# Patient Record
Sex: Female | Born: 1935
Health system: Southern US, Community
[De-identification: ages and names within clinical notes are randomized; demographics above are authoritative.]

## PROBLEM LIST (undated history)

## (undated) DIAGNOSIS — I2510 Atherosclerotic heart disease of native coronary artery without angina pectoris: Secondary | ICD-10-CM

## (undated) DIAGNOSIS — Z972 Presence of dental prosthetic device (complete) (partial): Secondary | ICD-10-CM

## (undated) DIAGNOSIS — M79605 Pain in left leg: Secondary | ICD-10-CM

## (undated) DIAGNOSIS — R270 Ataxia, unspecified: Secondary | ICD-10-CM

## (undated) DIAGNOSIS — I639 Cerebral infarction, unspecified: Secondary | ICD-10-CM

## (undated) DIAGNOSIS — E119 Type 2 diabetes mellitus without complications: Secondary | ICD-10-CM

## (undated) DIAGNOSIS — I214 Non-ST elevation (NSTEMI) myocardial infarction: Secondary | ICD-10-CM

## (undated) DIAGNOSIS — I219 Acute myocardial infarction, unspecified: Secondary | ICD-10-CM

## (undated) DIAGNOSIS — J45909 Unspecified asthma, uncomplicated: Secondary | ICD-10-CM

## (undated) DIAGNOSIS — I499 Cardiac arrhythmia, unspecified: Secondary | ICD-10-CM

## (undated) DIAGNOSIS — L989 Disorder of the skin and subcutaneous tissue, unspecified: Secondary | ICD-10-CM

## (undated) DIAGNOSIS — R569 Unspecified convulsions: Secondary | ICD-10-CM

## (undated) DIAGNOSIS — I4891 Unspecified atrial fibrillation: Secondary | ICD-10-CM

## (undated) DIAGNOSIS — I739 Peripheral vascular disease, unspecified: Secondary | ICD-10-CM

## (undated) DIAGNOSIS — J309 Allergic rhinitis, unspecified: Secondary | ICD-10-CM

## (undated) DIAGNOSIS — M1990 Unspecified osteoarthritis, unspecified site: Secondary | ICD-10-CM

## (undated) DIAGNOSIS — I10 Essential (primary) hypertension: Secondary | ICD-10-CM

## (undated) DIAGNOSIS — I509 Heart failure, unspecified: Secondary | ICD-10-CM

## (undated) DIAGNOSIS — H269 Unspecified cataract: Secondary | ICD-10-CM

## (undated) DIAGNOSIS — E785 Hyperlipidemia, unspecified: Secondary | ICD-10-CM

## (undated) DIAGNOSIS — E1140 Type 2 diabetes mellitus with diabetic neuropathy, unspecified: Secondary | ICD-10-CM

## (undated) DIAGNOSIS — S36029A Unspecified contusion of spleen, initial encounter: Secondary | ICD-10-CM

## (undated) DIAGNOSIS — E039 Hypothyroidism, unspecified: Secondary | ICD-10-CM

## (undated) DIAGNOSIS — M79604 Pain in right leg: Secondary | ICD-10-CM

## (undated) DIAGNOSIS — E079 Disorder of thyroid, unspecified: Secondary | ICD-10-CM

## (undated) DIAGNOSIS — E1121 Type 2 diabetes mellitus with diabetic nephropathy: Secondary | ICD-10-CM

## (undated) DIAGNOSIS — R748 Abnormal levels of other serum enzymes: Secondary | ICD-10-CM

## (undated) DIAGNOSIS — I480 Paroxysmal atrial fibrillation: Secondary | ICD-10-CM

## (undated) DIAGNOSIS — G709 Myoneural disorder, unspecified: Secondary | ICD-10-CM

## (undated) DIAGNOSIS — K760 Fatty (change of) liver, not elsewhere classified: Secondary | ICD-10-CM

## (undated) DIAGNOSIS — K08109 Complete loss of teeth, unspecified cause, unspecified class: Secondary | ICD-10-CM

## (undated) DIAGNOSIS — E559 Vitamin D deficiency, unspecified: Secondary | ICD-10-CM

## (undated) HISTORY — DX: Unspecified convulsions: R56.9

## (undated) HISTORY — DX: Allergic rhinitis, unspecified: J30.9

## (undated) HISTORY — PX: TONSILLECTOMY: SUR1361

## (undated) HISTORY — DX: Hypothyroidism, unspecified: E03.9

## (undated) HISTORY — DX: Peripheral vascular disease, unspecified: I73.9

## (undated) HISTORY — DX: Atherosclerotic heart disease of native coronary artery without angina pectoris: I25.10

## (undated) HISTORY — DX: Vitamin D deficiency, unspecified: E55.9

## (undated) HISTORY — DX: Unspecified asthma, uncomplicated: J45.909

## (undated) HISTORY — DX: Pain in right leg: M79.605

## (undated) HISTORY — PX: CORONARY ANGIOPLASTY: SHX604

## (undated) HISTORY — DX: Presence of dental prosthetic device (complete) (partial): Z97.2

## (undated) HISTORY — DX: Hyperlipidemia, unspecified: E78.5

## (undated) HISTORY — PX: CHOLECYSTECTOMY: SHX55

## (undated) HISTORY — DX: Type 2 diabetes mellitus with diabetic nephropathy: E11.21

## (undated) HISTORY — DX: Pain in right leg: M79.604

## (undated) HISTORY — DX: Disorder of the skin and subcutaneous tissue, unspecified: L98.9

## (undated) HISTORY — DX: Unspecified osteoarthritis, unspecified site: M19.90

## (undated) HISTORY — DX: Ataxia, unspecified: R27.0

## (undated) HISTORY — DX: Abnormal levels of other serum enzymes: R74.8

## (undated) HISTORY — DX: Essential (primary) hypertension: I10

## (undated) HISTORY — DX: Unspecified cataract: H26.9

## (undated) HISTORY — PX: APPENDECTOMY: SHX54

## (undated) HISTORY — DX: Complete loss of teeth, unspecified cause, unspecified class: K08.109

## (undated) HISTORY — PX: CATARACT EXTRACTION: SUR2

## (undated) HISTORY — DX: Fatty (change of) liver, not elsewhere classified: K76.0

## (undated) HISTORY — DX: Type 2 diabetes mellitus without complications: E11.9

---
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
## 2014-03-11 DIAGNOSIS — I10 Essential (primary) hypertension: Secondary | ICD-10-CM | POA: Diagnosis not present

## 2014-03-11 DIAGNOSIS — E782 Mixed hyperlipidemia: Secondary | ICD-10-CM | POA: Diagnosis not present

## 2014-03-11 DIAGNOSIS — E1140 Type 2 diabetes mellitus with diabetic neuropathy, unspecified: Secondary | ICD-10-CM | POA: Diagnosis not present

## 2014-03-11 DIAGNOSIS — K760 Fatty (change of) liver, not elsewhere classified: Secondary | ICD-10-CM | POA: Diagnosis not present

## 2014-03-31 DIAGNOSIS — H18411 Arcus senilis, right eye: Secondary | ICD-10-CM | POA: Diagnosis not present

## 2014-03-31 DIAGNOSIS — H2511 Age-related nuclear cataract, right eye: Secondary | ICD-10-CM | POA: Diagnosis not present

## 2014-03-31 DIAGNOSIS — H02839 Dermatochalasis of unspecified eye, unspecified eyelid: Secondary | ICD-10-CM | POA: Diagnosis not present

## 2014-03-31 DIAGNOSIS — H18412 Arcus senilis, left eye: Secondary | ICD-10-CM | POA: Diagnosis not present

## 2014-04-14 DIAGNOSIS — I10 Essential (primary) hypertension: Secondary | ICD-10-CM | POA: Diagnosis not present

## 2014-04-28 DIAGNOSIS — E1151 Type 2 diabetes mellitus with diabetic peripheral angiopathy without gangrene: Secondary | ICD-10-CM | POA: Diagnosis not present

## 2014-04-28 DIAGNOSIS — L600 Ingrowing nail: Secondary | ICD-10-CM | POA: Diagnosis not present

## 2014-04-28 DIAGNOSIS — I739 Peripheral vascular disease, unspecified: Secondary | ICD-10-CM | POA: Diagnosis not present

## 2014-05-03 DIAGNOSIS — I739 Peripheral vascular disease, unspecified: Secondary | ICD-10-CM | POA: Diagnosis not present

## 2014-05-03 DIAGNOSIS — M79604 Pain in right leg: Secondary | ICD-10-CM | POA: Diagnosis not present

## 2014-05-13 DIAGNOSIS — I10 Essential (primary) hypertension: Secondary | ICD-10-CM | POA: Diagnosis not present

## 2014-05-31 DIAGNOSIS — L600 Ingrowing nail: Secondary | ICD-10-CM | POA: Diagnosis not present

## 2014-05-31 DIAGNOSIS — I739 Peripheral vascular disease, unspecified: Secondary | ICD-10-CM | POA: Diagnosis not present

## 2014-05-31 DIAGNOSIS — E1151 Type 2 diabetes mellitus with diabetic peripheral angiopathy without gangrene: Secondary | ICD-10-CM | POA: Diagnosis not present

## 2014-06-06 DIAGNOSIS — I739 Peripheral vascular disease, unspecified: Secondary | ICD-10-CM | POA: Diagnosis not present

## 2014-06-06 DIAGNOSIS — I708 Atherosclerosis of other arteries: Secondary | ICD-10-CM | POA: Diagnosis not present

## 2014-06-13 DIAGNOSIS — I10 Essential (primary) hypertension: Secondary | ICD-10-CM | POA: Diagnosis not present

## 2014-06-13 DIAGNOSIS — E782 Mixed hyperlipidemia: Secondary | ICD-10-CM | POA: Diagnosis not present

## 2014-06-13 DIAGNOSIS — Z1389 Encounter for screening for other disorder: Secondary | ICD-10-CM | POA: Diagnosis not present

## 2014-06-13 DIAGNOSIS — E119 Type 2 diabetes mellitus without complications: Secondary | ICD-10-CM | POA: Diagnosis not present

## 2014-06-13 DIAGNOSIS — Z9181 History of falling: Secondary | ICD-10-CM | POA: Diagnosis not present

## 2014-06-13 DIAGNOSIS — T466X5A Adverse effect of antihyperlipidemic and antiarteriosclerotic drugs, initial encounter: Secondary | ICD-10-CM | POA: Diagnosis not present

## 2014-06-15 DIAGNOSIS — E1151 Type 2 diabetes mellitus with diabetic peripheral angiopathy without gangrene: Secondary | ICD-10-CM | POA: Diagnosis not present

## 2014-06-15 DIAGNOSIS — I739 Peripheral vascular disease, unspecified: Secondary | ICD-10-CM | POA: Diagnosis not present

## 2014-06-15 DIAGNOSIS — I6523 Occlusion and stenosis of bilateral carotid arteries: Secondary | ICD-10-CM | POA: Diagnosis not present

## 2014-06-29 DIAGNOSIS — I739 Peripheral vascular disease, unspecified: Secondary | ICD-10-CM | POA: Diagnosis not present

## 2014-06-29 DIAGNOSIS — Z01818 Encounter for other preprocedural examination: Secondary | ICD-10-CM | POA: Diagnosis not present

## 2014-07-04 DIAGNOSIS — I70201 Unspecified atherosclerosis of native arteries of extremities, right leg: Secondary | ICD-10-CM | POA: Diagnosis not present

## 2014-07-04 DIAGNOSIS — I739 Peripheral vascular disease, unspecified: Secondary | ICD-10-CM | POA: Diagnosis not present

## 2014-07-04 DIAGNOSIS — R079 Chest pain, unspecified: Secondary | ICD-10-CM | POA: Diagnosis not present

## 2014-08-05 DIAGNOSIS — Z9862 Peripheral vascular angioplasty status: Secondary | ICD-10-CM | POA: Diagnosis not present

## 2014-08-05 DIAGNOSIS — Z09 Encounter for follow-up examination after completed treatment for conditions other than malignant neoplasm: Secondary | ICD-10-CM | POA: Diagnosis not present

## 2014-08-05 DIAGNOSIS — I70611 Atherosclerosis of nonbiological bypass graft(s) of the extremities with intermittent claudication, right leg: Secondary | ICD-10-CM | POA: Diagnosis not present

## 2014-08-08 DIAGNOSIS — Z6822 Body mass index (BMI) 22.0-22.9, adult: Secondary | ICD-10-CM | POA: Diagnosis not present

## 2014-08-08 DIAGNOSIS — M79673 Pain in unspecified foot: Secondary | ICD-10-CM | POA: Diagnosis not present

## 2014-08-22 DIAGNOSIS — Z6822 Body mass index (BMI) 22.0-22.9, adult: Secondary | ICD-10-CM | POA: Diagnosis not present

## 2014-08-22 DIAGNOSIS — L989 Disorder of the skin and subcutaneous tissue, unspecified: Secondary | ICD-10-CM | POA: Diagnosis not present

## 2014-09-13 DIAGNOSIS — I10 Essential (primary) hypertension: Secondary | ICD-10-CM | POA: Diagnosis not present

## 2014-09-13 DIAGNOSIS — Z6822 Body mass index (BMI) 22.0-22.9, adult: Secondary | ICD-10-CM | POA: Diagnosis not present

## 2014-09-13 DIAGNOSIS — E559 Vitamin D deficiency, unspecified: Secondary | ICD-10-CM | POA: Diagnosis not present

## 2014-09-13 DIAGNOSIS — E782 Mixed hyperlipidemia: Secondary | ICD-10-CM | POA: Diagnosis not present

## 2014-09-13 DIAGNOSIS — E119 Type 2 diabetes mellitus without complications: Secondary | ICD-10-CM | POA: Diagnosis not present

## 2014-11-15 DIAGNOSIS — H2512 Age-related nuclear cataract, left eye: Secondary | ICD-10-CM | POA: Diagnosis not present

## 2014-12-06 DIAGNOSIS — E785 Hyperlipidemia, unspecified: Secondary | ICD-10-CM | POA: Diagnosis not present

## 2014-12-06 DIAGNOSIS — E039 Hypothyroidism, unspecified: Secondary | ICD-10-CM | POA: Diagnosis not present

## 2014-12-06 DIAGNOSIS — Z7982 Long term (current) use of aspirin: Secondary | ICD-10-CM | POA: Diagnosis not present

## 2014-12-06 DIAGNOSIS — G629 Polyneuropathy, unspecified: Secondary | ICD-10-CM | POA: Diagnosis not present

## 2014-12-06 DIAGNOSIS — Z87891 Personal history of nicotine dependence: Secondary | ICD-10-CM | POA: Diagnosis not present

## 2014-12-06 DIAGNOSIS — Z7984 Long term (current) use of oral hypoglycemic drugs: Secondary | ICD-10-CM | POA: Diagnosis not present

## 2014-12-06 DIAGNOSIS — E119 Type 2 diabetes mellitus without complications: Secondary | ICD-10-CM | POA: Diagnosis not present

## 2014-12-06 DIAGNOSIS — H2512 Age-related nuclear cataract, left eye: Secondary | ICD-10-CM | POA: Diagnosis not present

## 2014-12-07 DIAGNOSIS — H2512 Age-related nuclear cataract, left eye: Secondary | ICD-10-CM | POA: Diagnosis not present

## 2014-12-19 DIAGNOSIS — Z23 Encounter for immunization: Secondary | ICD-10-CM | POA: Diagnosis not present

## 2014-12-19 DIAGNOSIS — Z9849 Cataract extraction status, unspecified eye: Secondary | ICD-10-CM | POA: Diagnosis not present

## 2014-12-19 DIAGNOSIS — E1140 Type 2 diabetes mellitus with diabetic neuropathy, unspecified: Secondary | ICD-10-CM | POA: Diagnosis not present

## 2014-12-19 DIAGNOSIS — I10 Essential (primary) hypertension: Secondary | ICD-10-CM | POA: Diagnosis not present

## 2014-12-19 DIAGNOSIS — I739 Peripheral vascular disease, unspecified: Secondary | ICD-10-CM | POA: Diagnosis not present

## 2014-12-19 DIAGNOSIS — H269 Unspecified cataract: Secondary | ICD-10-CM | POA: Diagnosis not present

## 2014-12-19 DIAGNOSIS — E1142 Type 2 diabetes mellitus with diabetic polyneuropathy: Secondary | ICD-10-CM | POA: Diagnosis not present

## 2014-12-19 DIAGNOSIS — Z6822 Body mass index (BMI) 22.0-22.9, adult: Secondary | ICD-10-CM | POA: Diagnosis not present

## 2015-01-03 DIAGNOSIS — I10 Essential (primary) hypertension: Secondary | ICD-10-CM | POA: Diagnosis not present

## 2015-01-03 DIAGNOSIS — H25811 Combined forms of age-related cataract, right eye: Secondary | ICD-10-CM | POA: Diagnosis not present

## 2015-01-03 DIAGNOSIS — E039 Hypothyroidism, unspecified: Secondary | ICD-10-CM | POA: Diagnosis not present

## 2015-01-03 DIAGNOSIS — E119 Type 2 diabetes mellitus without complications: Secondary | ICD-10-CM | POA: Diagnosis not present

## 2015-01-03 DIAGNOSIS — Z79899 Other long term (current) drug therapy: Secondary | ICD-10-CM | POA: Diagnosis not present

## 2015-01-03 DIAGNOSIS — E785 Hyperlipidemia, unspecified: Secondary | ICD-10-CM | POA: Diagnosis not present

## 2015-01-03 DIAGNOSIS — H2511 Age-related nuclear cataract, right eye: Secondary | ICD-10-CM | POA: Diagnosis not present

## 2015-01-04 DIAGNOSIS — H2511 Age-related nuclear cataract, right eye: Secondary | ICD-10-CM | POA: Diagnosis not present

## 2015-01-31 DIAGNOSIS — Z9862 Peripheral vascular angioplasty status: Secondary | ICD-10-CM | POA: Diagnosis not present

## 2015-01-31 DIAGNOSIS — Z95820 Peripheral vascular angioplasty status with implants and grafts: Secondary | ICD-10-CM | POA: Diagnosis not present

## 2015-01-31 DIAGNOSIS — I872 Venous insufficiency (chronic) (peripheral): Secondary | ICD-10-CM | POA: Diagnosis not present

## 2015-02-17 DIAGNOSIS — H02402 Unspecified ptosis of left eyelid: Secondary | ICD-10-CM | POA: Diagnosis not present

## 2015-02-17 DIAGNOSIS — H029 Unspecified disorder of eyelid: Secondary | ICD-10-CM | POA: Diagnosis not present

## 2015-02-17 DIAGNOSIS — Z961 Presence of intraocular lens: Secondary | ICD-10-CM | POA: Diagnosis not present

## 2015-03-24 DIAGNOSIS — E1140 Type 2 diabetes mellitus with diabetic neuropathy, unspecified: Secondary | ICD-10-CM | POA: Diagnosis not present

## 2015-03-24 DIAGNOSIS — I10 Essential (primary) hypertension: Secondary | ICD-10-CM | POA: Diagnosis not present

## 2015-03-24 DIAGNOSIS — E1142 Type 2 diabetes mellitus with diabetic polyneuropathy: Secondary | ICD-10-CM | POA: Diagnosis not present

## 2015-03-24 DIAGNOSIS — Z972 Presence of dental prosthetic device (complete) (partial): Secondary | ICD-10-CM | POA: Diagnosis not present

## 2015-03-24 DIAGNOSIS — I739 Peripheral vascular disease, unspecified: Secondary | ICD-10-CM | POA: Diagnosis not present

## 2015-03-24 DIAGNOSIS — E782 Mixed hyperlipidemia: Secondary | ICD-10-CM | POA: Diagnosis not present

## 2015-03-24 DIAGNOSIS — L989 Disorder of the skin and subcutaneous tissue, unspecified: Secondary | ICD-10-CM | POA: Diagnosis not present

## 2015-03-24 DIAGNOSIS — Z6822 Body mass index (BMI) 22.0-22.9, adult: Secondary | ICD-10-CM | POA: Diagnosis not present

## 2015-06-16 DIAGNOSIS — E782 Mixed hyperlipidemia: Secondary | ICD-10-CM | POA: Diagnosis not present

## 2015-06-16 DIAGNOSIS — Z6823 Body mass index (BMI) 23.0-23.9, adult: Secondary | ICD-10-CM | POA: Diagnosis not present

## 2015-06-16 DIAGNOSIS — E1142 Type 2 diabetes mellitus with diabetic polyneuropathy: Secondary | ICD-10-CM | POA: Diagnosis not present

## 2015-06-16 DIAGNOSIS — Z1389 Encounter for screening for other disorder: Secondary | ICD-10-CM | POA: Diagnosis not present

## 2015-06-16 DIAGNOSIS — I739 Peripheral vascular disease, unspecified: Secondary | ICD-10-CM | POA: Diagnosis not present

## 2015-06-16 DIAGNOSIS — E559 Vitamin D deficiency, unspecified: Secondary | ICD-10-CM | POA: Diagnosis not present

## 2015-06-16 DIAGNOSIS — I10 Essential (primary) hypertension: Secondary | ICD-10-CM | POA: Diagnosis not present

## 2015-06-16 DIAGNOSIS — E1140 Type 2 diabetes mellitus with diabetic neuropathy, unspecified: Secondary | ICD-10-CM | POA: Diagnosis not present

## 2015-07-14 DIAGNOSIS — I70211 Atherosclerosis of native arteries of extremities with intermittent claudication, right leg: Secondary | ICD-10-CM | POA: Diagnosis not present

## 2015-07-14 DIAGNOSIS — R1030 Lower abdominal pain, unspecified: Secondary | ICD-10-CM | POA: Diagnosis not present

## 2015-07-14 DIAGNOSIS — I872 Venous insufficiency (chronic) (peripheral): Secondary | ICD-10-CM | POA: Diagnosis not present

## 2015-07-27 DIAGNOSIS — H02402 Unspecified ptosis of left eyelid: Secondary | ICD-10-CM | POA: Diagnosis not present

## 2015-07-27 DIAGNOSIS — H04123 Dry eye syndrome of bilateral lacrimal glands: Secondary | ICD-10-CM | POA: Diagnosis not present

## 2015-09-18 DIAGNOSIS — I10 Essential (primary) hypertension: Secondary | ICD-10-CM | POA: Diagnosis not present

## 2015-09-18 DIAGNOSIS — E119 Type 2 diabetes mellitus without complications: Secondary | ICD-10-CM | POA: Diagnosis not present

## 2015-09-18 DIAGNOSIS — E785 Hyperlipidemia, unspecified: Secondary | ICD-10-CM | POA: Diagnosis not present

## 2015-09-18 DIAGNOSIS — Z6822 Body mass index (BMI) 22.0-22.9, adult: Secondary | ICD-10-CM | POA: Diagnosis not present

## 2015-09-19 DIAGNOSIS — H02402 Unspecified ptosis of left eyelid: Secondary | ICD-10-CM | POA: Diagnosis not present

## 2015-11-23 DIAGNOSIS — Z23 Encounter for immunization: Secondary | ICD-10-CM | POA: Diagnosis not present

## 2016-01-01 DIAGNOSIS — E785 Hyperlipidemia, unspecified: Secondary | ICD-10-CM | POA: Diagnosis not present

## 2016-01-01 DIAGNOSIS — E119 Type 2 diabetes mellitus without complications: Secondary | ICD-10-CM | POA: Diagnosis not present

## 2016-01-01 DIAGNOSIS — I10 Essential (primary) hypertension: Secondary | ICD-10-CM | POA: Diagnosis not present

## 2016-02-01 DIAGNOSIS — E559 Vitamin D deficiency, unspecified: Secondary | ICD-10-CM | POA: Diagnosis not present

## 2016-02-01 DIAGNOSIS — I10 Essential (primary) hypertension: Secondary | ICD-10-CM | POA: Diagnosis not present

## 2016-02-01 DIAGNOSIS — E039 Hypothyroidism, unspecified: Secondary | ICD-10-CM | POA: Diagnosis not present

## 2016-03-11 DIAGNOSIS — L300 Nummular dermatitis: Secondary | ICD-10-CM | POA: Diagnosis not present

## 2016-03-11 DIAGNOSIS — L219 Seborrheic dermatitis, unspecified: Secondary | ICD-10-CM | POA: Diagnosis not present

## 2016-03-11 DIAGNOSIS — L299 Pruritus, unspecified: Secondary | ICD-10-CM | POA: Diagnosis not present

## 2016-04-02 DIAGNOSIS — E119 Type 2 diabetes mellitus without complications: Secondary | ICD-10-CM | POA: Diagnosis not present

## 2016-04-02 DIAGNOSIS — E039 Hypothyroidism, unspecified: Secondary | ICD-10-CM | POA: Diagnosis not present

## 2016-04-02 DIAGNOSIS — E785 Hyperlipidemia, unspecified: Secondary | ICD-10-CM | POA: Diagnosis not present

## 2016-04-02 DIAGNOSIS — Z6822 Body mass index (BMI) 22.0-22.9, adult: Secondary | ICD-10-CM | POA: Diagnosis not present

## 2016-04-02 DIAGNOSIS — I10 Essential (primary) hypertension: Secondary | ICD-10-CM | POA: Diagnosis not present

## 2016-04-08 DIAGNOSIS — E875 Hyperkalemia: Secondary | ICD-10-CM | POA: Diagnosis not present

## 2016-04-08 DIAGNOSIS — E119 Type 2 diabetes mellitus without complications: Secondary | ICD-10-CM | POA: Diagnosis not present

## 2016-04-08 DIAGNOSIS — E039 Hypothyroidism, unspecified: Secondary | ICD-10-CM | POA: Diagnosis not present

## 2016-07-04 DIAGNOSIS — Z1389 Encounter for screening for other disorder: Secondary | ICD-10-CM | POA: Diagnosis not present

## 2016-07-04 DIAGNOSIS — E119 Type 2 diabetes mellitus without complications: Secondary | ICD-10-CM | POA: Diagnosis not present

## 2016-07-04 DIAGNOSIS — Z6822 Body mass index (BMI) 22.0-22.9, adult: Secondary | ICD-10-CM | POA: Diagnosis not present

## 2016-07-04 DIAGNOSIS — E785 Hyperlipidemia, unspecified: Secondary | ICD-10-CM | POA: Diagnosis not present

## 2016-07-04 DIAGNOSIS — I10 Essential (primary) hypertension: Secondary | ICD-10-CM | POA: Diagnosis not present

## 2016-07-04 DIAGNOSIS — E559 Vitamin D deficiency, unspecified: Secondary | ICD-10-CM | POA: Diagnosis not present

## 2016-07-31 DIAGNOSIS — Z9181 History of falling: Secondary | ICD-10-CM | POA: Diagnosis not present

## 2016-07-31 DIAGNOSIS — Z139 Encounter for screening, unspecified: Secondary | ICD-10-CM | POA: Diagnosis not present

## 2016-07-31 DIAGNOSIS — E039 Hypothyroidism, unspecified: Secondary | ICD-10-CM | POA: Diagnosis not present

## 2016-07-31 DIAGNOSIS — E1142 Type 2 diabetes mellitus with diabetic polyneuropathy: Secondary | ICD-10-CM | POA: Diagnosis not present

## 2016-07-31 DIAGNOSIS — E559 Vitamin D deficiency, unspecified: Secondary | ICD-10-CM | POA: Diagnosis not present

## 2016-07-31 DIAGNOSIS — J45998 Other asthma: Secondary | ICD-10-CM | POA: Diagnosis not present

## 2016-07-31 DIAGNOSIS — J309 Allergic rhinitis, unspecified: Secondary | ICD-10-CM | POA: Diagnosis not present

## 2016-07-31 DIAGNOSIS — Z1389 Encounter for screening for other disorder: Secondary | ICD-10-CM | POA: Diagnosis not present

## 2016-07-31 DIAGNOSIS — I10 Essential (primary) hypertension: Secondary | ICD-10-CM | POA: Diagnosis not present

## 2016-07-31 DIAGNOSIS — R748 Abnormal levels of other serum enzymes: Secondary | ICD-10-CM | POA: Diagnosis not present

## 2016-07-31 DIAGNOSIS — Z23 Encounter for immunization: Secondary | ICD-10-CM | POA: Diagnosis not present

## 2016-08-06 DIAGNOSIS — H3562 Retinal hemorrhage, left eye: Secondary | ICD-10-CM | POA: Diagnosis not present

## 2016-08-06 DIAGNOSIS — H524 Presbyopia: Secondary | ICD-10-CM | POA: Diagnosis not present

## 2016-08-26 DIAGNOSIS — K7581 Nonalcoholic steatohepatitis (NASH): Secondary | ICD-10-CM | POA: Diagnosis not present

## 2016-08-26 DIAGNOSIS — N390 Urinary tract infection, site not specified: Secondary | ICD-10-CM | POA: Diagnosis not present

## 2016-08-26 DIAGNOSIS — R0789 Other chest pain: Secondary | ICD-10-CM | POA: Diagnosis not present

## 2016-08-26 DIAGNOSIS — R42 Dizziness and giddiness: Secondary | ICD-10-CM | POA: Diagnosis not present

## 2016-08-26 DIAGNOSIS — R079 Chest pain, unspecified: Secondary | ICD-10-CM | POA: Diagnosis not present

## 2016-08-26 DIAGNOSIS — R8271 Bacteriuria: Secondary | ICD-10-CM | POA: Diagnosis not present

## 2016-08-26 DIAGNOSIS — R748 Abnormal levels of other serum enzymes: Secondary | ICD-10-CM | POA: Diagnosis not present

## 2016-08-26 DIAGNOSIS — I249 Acute ischemic heart disease, unspecified: Secondary | ICD-10-CM | POA: Diagnosis not present

## 2016-08-26 DIAGNOSIS — I10 Essential (primary) hypertension: Secondary | ICD-10-CM | POA: Diagnosis not present

## 2016-08-26 DIAGNOSIS — R9431 Abnormal electrocardiogram [ECG] [EKG]: Secondary | ICD-10-CM | POA: Diagnosis not present

## 2016-08-26 DIAGNOSIS — E119 Type 2 diabetes mellitus without complications: Secondary | ICD-10-CM | POA: Diagnosis not present

## 2016-08-26 DIAGNOSIS — E039 Hypothyroidism, unspecified: Secondary | ICD-10-CM | POA: Diagnosis not present

## 2016-08-26 DIAGNOSIS — Z79899 Other long term (current) drug therapy: Secondary | ICD-10-CM | POA: Diagnosis not present

## 2016-08-27 DIAGNOSIS — E039 Hypothyroidism, unspecified: Secondary | ICD-10-CM | POA: Diagnosis not present

## 2016-08-27 DIAGNOSIS — I10 Essential (primary) hypertension: Secondary | ICD-10-CM | POA: Diagnosis not present

## 2016-08-27 DIAGNOSIS — K7581 Nonalcoholic steatohepatitis (NASH): Secondary | ICD-10-CM | POA: Diagnosis not present

## 2016-08-27 DIAGNOSIS — R8271 Bacteriuria: Secondary | ICD-10-CM | POA: Diagnosis not present

## 2016-08-27 DIAGNOSIS — R0789 Other chest pain: Secondary | ICD-10-CM | POA: Diagnosis not present

## 2016-08-27 DIAGNOSIS — E119 Type 2 diabetes mellitus without complications: Secondary | ICD-10-CM | POA: Diagnosis not present

## 2016-08-27 DIAGNOSIS — R9431 Abnormal electrocardiogram [ECG] [EKG]: Secondary | ICD-10-CM | POA: Diagnosis not present

## 2016-08-27 DIAGNOSIS — R079 Chest pain, unspecified: Secondary | ICD-10-CM | POA: Diagnosis not present

## 2016-08-29 DIAGNOSIS — Z6822 Body mass index (BMI) 22.0-22.9, adult: Secondary | ICD-10-CM | POA: Diagnosis not present

## 2016-08-29 DIAGNOSIS — E559 Vitamin D deficiency, unspecified: Secondary | ICD-10-CM | POA: Diagnosis not present

## 2016-08-29 DIAGNOSIS — R079 Chest pain, unspecified: Secondary | ICD-10-CM | POA: Diagnosis not present

## 2016-08-29 DIAGNOSIS — E1140 Type 2 diabetes mellitus with diabetic neuropathy, unspecified: Secondary | ICD-10-CM | POA: Diagnosis not present

## 2016-08-29 DIAGNOSIS — E782 Mixed hyperlipidemia: Secondary | ICD-10-CM | POA: Diagnosis not present

## 2016-08-29 DIAGNOSIS — R413 Other amnesia: Secondary | ICD-10-CM | POA: Diagnosis not present

## 2016-08-29 DIAGNOSIS — J309 Allergic rhinitis, unspecified: Secondary | ICD-10-CM | POA: Diagnosis not present

## 2016-08-29 DIAGNOSIS — Z09 Encounter for follow-up examination after completed treatment for conditions other than malignant neoplasm: Secondary | ICD-10-CM | POA: Diagnosis not present

## 2016-08-29 DIAGNOSIS — Z79899 Other long term (current) drug therapy: Secondary | ICD-10-CM | POA: Diagnosis not present

## 2016-09-01 DIAGNOSIS — R1084 Generalized abdominal pain: Secondary | ICD-10-CM | POA: Diagnosis not present

## 2016-09-01 DIAGNOSIS — R109 Unspecified abdominal pain: Secondary | ICD-10-CM | POA: Diagnosis not present

## 2016-09-01 DIAGNOSIS — I739 Peripheral vascular disease, unspecified: Secondary | ICD-10-CM | POA: Diagnosis not present

## 2016-09-01 DIAGNOSIS — E1140 Type 2 diabetes mellitus with diabetic neuropathy, unspecified: Secondary | ICD-10-CM | POA: Diagnosis not present

## 2016-09-01 DIAGNOSIS — N390 Urinary tract infection, site not specified: Secondary | ICD-10-CM | POA: Diagnosis not present

## 2016-09-01 DIAGNOSIS — R413 Other amnesia: Secondary | ICD-10-CM | POA: Diagnosis not present

## 2016-09-01 DIAGNOSIS — R1110 Vomiting, unspecified: Secondary | ICD-10-CM | POA: Diagnosis not present

## 2016-09-01 DIAGNOSIS — J4530 Mild persistent asthma, uncomplicated: Secondary | ICD-10-CM | POA: Diagnosis not present

## 2016-09-01 DIAGNOSIS — R4182 Altered mental status, unspecified: Secondary | ICD-10-CM | POA: Diagnosis not present

## 2016-09-01 DIAGNOSIS — R079 Chest pain, unspecified: Secondary | ICD-10-CM | POA: Diagnosis not present

## 2016-09-01 DIAGNOSIS — E785 Hyperlipidemia, unspecified: Secondary | ICD-10-CM | POA: Diagnosis not present

## 2016-09-01 DIAGNOSIS — M1991 Primary osteoarthritis, unspecified site: Secondary | ICD-10-CM | POA: Diagnosis not present

## 2016-09-01 DIAGNOSIS — E1365 Other specified diabetes mellitus with hyperglycemia: Secondary | ICD-10-CM | POA: Diagnosis not present

## 2016-09-01 DIAGNOSIS — R112 Nausea with vomiting, unspecified: Secondary | ICD-10-CM | POA: Diagnosis not present

## 2016-09-01 DIAGNOSIS — R42 Dizziness and giddiness: Secondary | ICD-10-CM | POA: Diagnosis not present

## 2016-09-01 DIAGNOSIS — I10 Essential (primary) hypertension: Secondary | ICD-10-CM | POA: Diagnosis not present

## 2016-09-01 DIAGNOSIS — J309 Allergic rhinitis, unspecified: Secondary | ICD-10-CM | POA: Diagnosis not present

## 2016-09-03 DIAGNOSIS — R413 Other amnesia: Secondary | ICD-10-CM | POA: Diagnosis not present

## 2016-09-03 DIAGNOSIS — J4530 Mild persistent asthma, uncomplicated: Secondary | ICD-10-CM | POA: Diagnosis not present

## 2016-09-03 DIAGNOSIS — N390 Urinary tract infection, site not specified: Secondary | ICD-10-CM | POA: Diagnosis not present

## 2016-09-03 DIAGNOSIS — R079 Chest pain, unspecified: Secondary | ICD-10-CM | POA: Diagnosis not present

## 2016-09-03 DIAGNOSIS — E1140 Type 2 diabetes mellitus with diabetic neuropathy, unspecified: Secondary | ICD-10-CM | POA: Diagnosis not present

## 2016-09-03 DIAGNOSIS — M1991 Primary osteoarthritis, unspecified site: Secondary | ICD-10-CM | POA: Diagnosis not present

## 2016-09-03 DIAGNOSIS — I739 Peripheral vascular disease, unspecified: Secondary | ICD-10-CM | POA: Diagnosis not present

## 2016-09-03 DIAGNOSIS — J309 Allergic rhinitis, unspecified: Secondary | ICD-10-CM | POA: Diagnosis not present

## 2016-09-03 DIAGNOSIS — E785 Hyperlipidemia, unspecified: Secondary | ICD-10-CM | POA: Diagnosis not present

## 2016-09-05 DIAGNOSIS — R413 Other amnesia: Secondary | ICD-10-CM | POA: Diagnosis not present

## 2016-09-05 DIAGNOSIS — I10 Essential (primary) hypertension: Secondary | ICD-10-CM | POA: Diagnosis not present

## 2016-09-05 DIAGNOSIS — Z6822 Body mass index (BMI) 22.0-22.9, adult: Secondary | ICD-10-CM | POA: Diagnosis not present

## 2016-09-06 DIAGNOSIS — R413 Other amnesia: Secondary | ICD-10-CM | POA: Diagnosis not present

## 2016-09-06 DIAGNOSIS — J4530 Mild persistent asthma, uncomplicated: Secondary | ICD-10-CM | POA: Diagnosis not present

## 2016-09-06 DIAGNOSIS — I739 Peripheral vascular disease, unspecified: Secondary | ICD-10-CM | POA: Diagnosis not present

## 2016-09-06 DIAGNOSIS — R079 Chest pain, unspecified: Secondary | ICD-10-CM | POA: Diagnosis not present

## 2016-09-06 DIAGNOSIS — J309 Allergic rhinitis, unspecified: Secondary | ICD-10-CM | POA: Diagnosis not present

## 2016-09-06 DIAGNOSIS — M1991 Primary osteoarthritis, unspecified site: Secondary | ICD-10-CM | POA: Diagnosis not present

## 2016-09-06 DIAGNOSIS — E785 Hyperlipidemia, unspecified: Secondary | ICD-10-CM | POA: Diagnosis not present

## 2016-09-06 DIAGNOSIS — E1140 Type 2 diabetes mellitus with diabetic neuropathy, unspecified: Secondary | ICD-10-CM | POA: Diagnosis not present

## 2016-09-06 DIAGNOSIS — N390 Urinary tract infection, site not specified: Secondary | ICD-10-CM | POA: Diagnosis not present

## 2016-09-09 DIAGNOSIS — R079 Chest pain, unspecified: Secondary | ICD-10-CM | POA: Diagnosis not present

## 2016-09-09 DIAGNOSIS — R413 Other amnesia: Secondary | ICD-10-CM | POA: Diagnosis not present

## 2016-09-09 DIAGNOSIS — N390 Urinary tract infection, site not specified: Secondary | ICD-10-CM | POA: Diagnosis not present

## 2016-09-09 DIAGNOSIS — I739 Peripheral vascular disease, unspecified: Secondary | ICD-10-CM | POA: Diagnosis not present

## 2016-09-09 DIAGNOSIS — E1140 Type 2 diabetes mellitus with diabetic neuropathy, unspecified: Secondary | ICD-10-CM | POA: Diagnosis not present

## 2016-09-09 DIAGNOSIS — E785 Hyperlipidemia, unspecified: Secondary | ICD-10-CM | POA: Diagnosis not present

## 2016-09-09 DIAGNOSIS — J309 Allergic rhinitis, unspecified: Secondary | ICD-10-CM | POA: Diagnosis not present

## 2016-09-09 DIAGNOSIS — J4530 Mild persistent asthma, uncomplicated: Secondary | ICD-10-CM | POA: Diagnosis not present

## 2016-09-09 DIAGNOSIS — M1991 Primary osteoarthritis, unspecified site: Secondary | ICD-10-CM | POA: Diagnosis not present

## 2016-09-12 DIAGNOSIS — J4530 Mild persistent asthma, uncomplicated: Secondary | ICD-10-CM | POA: Diagnosis not present

## 2016-09-12 DIAGNOSIS — J309 Allergic rhinitis, unspecified: Secondary | ICD-10-CM | POA: Diagnosis not present

## 2016-09-12 DIAGNOSIS — R413 Other amnesia: Secondary | ICD-10-CM | POA: Diagnosis not present

## 2016-09-12 DIAGNOSIS — M1991 Primary osteoarthritis, unspecified site: Secondary | ICD-10-CM | POA: Diagnosis not present

## 2016-09-12 DIAGNOSIS — E1140 Type 2 diabetes mellitus with diabetic neuropathy, unspecified: Secondary | ICD-10-CM | POA: Diagnosis not present

## 2016-09-12 DIAGNOSIS — E785 Hyperlipidemia, unspecified: Secondary | ICD-10-CM | POA: Diagnosis not present

## 2016-09-12 DIAGNOSIS — R079 Chest pain, unspecified: Secondary | ICD-10-CM | POA: Diagnosis not present

## 2016-09-12 DIAGNOSIS — N390 Urinary tract infection, site not specified: Secondary | ICD-10-CM | POA: Diagnosis not present

## 2016-09-12 DIAGNOSIS — I739 Peripheral vascular disease, unspecified: Secondary | ICD-10-CM | POA: Diagnosis not present

## 2016-09-17 DIAGNOSIS — J4530 Mild persistent asthma, uncomplicated: Secondary | ICD-10-CM | POA: Diagnosis not present

## 2016-09-17 DIAGNOSIS — N390 Urinary tract infection, site not specified: Secondary | ICD-10-CM | POA: Diagnosis not present

## 2016-09-17 DIAGNOSIS — J309 Allergic rhinitis, unspecified: Secondary | ICD-10-CM | POA: Diagnosis not present

## 2016-09-17 DIAGNOSIS — E785 Hyperlipidemia, unspecified: Secondary | ICD-10-CM | POA: Diagnosis not present

## 2016-09-17 DIAGNOSIS — R413 Other amnesia: Secondary | ICD-10-CM | POA: Diagnosis not present

## 2016-09-17 DIAGNOSIS — E1140 Type 2 diabetes mellitus with diabetic neuropathy, unspecified: Secondary | ICD-10-CM | POA: Diagnosis not present

## 2016-09-17 DIAGNOSIS — I739 Peripheral vascular disease, unspecified: Secondary | ICD-10-CM | POA: Diagnosis not present

## 2016-09-17 DIAGNOSIS — R079 Chest pain, unspecified: Secondary | ICD-10-CM | POA: Diagnosis not present

## 2016-09-17 DIAGNOSIS — M1991 Primary osteoarthritis, unspecified site: Secondary | ICD-10-CM | POA: Diagnosis not present

## 2016-09-24 DIAGNOSIS — R079 Chest pain, unspecified: Secondary | ICD-10-CM | POA: Diagnosis not present

## 2016-09-24 DIAGNOSIS — E1140 Type 2 diabetes mellitus with diabetic neuropathy, unspecified: Secondary | ICD-10-CM | POA: Diagnosis not present

## 2016-09-24 DIAGNOSIS — J4530 Mild persistent asthma, uncomplicated: Secondary | ICD-10-CM | POA: Diagnosis not present

## 2016-09-24 DIAGNOSIS — J309 Allergic rhinitis, unspecified: Secondary | ICD-10-CM | POA: Diagnosis not present

## 2016-09-24 DIAGNOSIS — M1991 Primary osteoarthritis, unspecified site: Secondary | ICD-10-CM | POA: Diagnosis not present

## 2016-09-24 DIAGNOSIS — R413 Other amnesia: Secondary | ICD-10-CM | POA: Diagnosis not present

## 2016-09-24 DIAGNOSIS — E785 Hyperlipidemia, unspecified: Secondary | ICD-10-CM | POA: Diagnosis not present

## 2016-09-24 DIAGNOSIS — N390 Urinary tract infection, site not specified: Secondary | ICD-10-CM | POA: Diagnosis not present

## 2016-09-24 DIAGNOSIS — I739 Peripheral vascular disease, unspecified: Secondary | ICD-10-CM | POA: Diagnosis not present

## 2016-09-26 ENCOUNTER — Ambulatory Visit: Payer: Medicare HMO | Admitting: Cardiology

## 2016-09-30 DIAGNOSIS — I10 Essential (primary) hypertension: Secondary | ICD-10-CM | POA: Diagnosis not present

## 2016-09-30 DIAGNOSIS — M1990 Unspecified osteoarthritis, unspecified site: Secondary | ICD-10-CM | POA: Diagnosis not present

## 2016-09-30 DIAGNOSIS — E559 Vitamin D deficiency, unspecified: Secondary | ICD-10-CM | POA: Diagnosis not present

## 2016-09-30 DIAGNOSIS — Z6822 Body mass index (BMI) 22.0-22.9, adult: Secondary | ICD-10-CM | POA: Diagnosis not present

## 2016-09-30 DIAGNOSIS — J309 Allergic rhinitis, unspecified: Secondary | ICD-10-CM | POA: Diagnosis not present

## 2016-09-30 DIAGNOSIS — R413 Other amnesia: Secondary | ICD-10-CM | POA: Diagnosis not present

## 2016-09-30 DIAGNOSIS — K760 Fatty (change of) liver, not elsewhere classified: Secondary | ICD-10-CM | POA: Diagnosis not present

## 2016-10-01 DIAGNOSIS — M1991 Primary osteoarthritis, unspecified site: Secondary | ICD-10-CM | POA: Diagnosis not present

## 2016-10-01 DIAGNOSIS — N390 Urinary tract infection, site not specified: Secondary | ICD-10-CM | POA: Diagnosis not present

## 2016-10-01 DIAGNOSIS — E785 Hyperlipidemia, unspecified: Secondary | ICD-10-CM | POA: Diagnosis not present

## 2016-10-01 DIAGNOSIS — J309 Allergic rhinitis, unspecified: Secondary | ICD-10-CM | POA: Diagnosis not present

## 2016-10-01 DIAGNOSIS — I739 Peripheral vascular disease, unspecified: Secondary | ICD-10-CM | POA: Diagnosis not present

## 2016-10-01 DIAGNOSIS — E1140 Type 2 diabetes mellitus with diabetic neuropathy, unspecified: Secondary | ICD-10-CM | POA: Diagnosis not present

## 2016-10-01 DIAGNOSIS — R413 Other amnesia: Secondary | ICD-10-CM | POA: Diagnosis not present

## 2016-10-01 DIAGNOSIS — R079 Chest pain, unspecified: Secondary | ICD-10-CM | POA: Diagnosis not present

## 2016-10-01 DIAGNOSIS — J4530 Mild persistent asthma, uncomplicated: Secondary | ICD-10-CM | POA: Diagnosis not present

## 2016-10-29 DIAGNOSIS — M7989 Other specified soft tissue disorders: Secondary | ICD-10-CM | POA: Diagnosis not present

## 2016-10-29 DIAGNOSIS — Z6822 Body mass index (BMI) 22.0-22.9, adult: Secondary | ICD-10-CM | POA: Diagnosis not present

## 2016-10-29 DIAGNOSIS — J309 Allergic rhinitis, unspecified: Secondary | ICD-10-CM | POA: Diagnosis not present

## 2016-10-29 DIAGNOSIS — I10 Essential (primary) hypertension: Secondary | ICD-10-CM | POA: Diagnosis not present

## 2016-12-31 DIAGNOSIS — Z6822 Body mass index (BMI) 22.0-22.9, adult: Secondary | ICD-10-CM | POA: Diagnosis not present

## 2016-12-31 DIAGNOSIS — L989 Disorder of the skin and subcutaneous tissue, unspecified: Secondary | ICD-10-CM | POA: Diagnosis not present

## 2016-12-31 DIAGNOSIS — I10 Essential (primary) hypertension: Secondary | ICD-10-CM | POA: Diagnosis not present

## 2016-12-31 DIAGNOSIS — E1140 Type 2 diabetes mellitus with diabetic neuropathy, unspecified: Secondary | ICD-10-CM | POA: Diagnosis not present

## 2016-12-31 DIAGNOSIS — J309 Allergic rhinitis, unspecified: Secondary | ICD-10-CM | POA: Diagnosis not present

## 2016-12-31 DIAGNOSIS — E039 Hypothyroidism, unspecified: Secondary | ICD-10-CM | POA: Diagnosis not present

## 2016-12-31 DIAGNOSIS — E782 Mixed hyperlipidemia: Secondary | ICD-10-CM | POA: Diagnosis not present

## 2016-12-31 DIAGNOSIS — Z23 Encounter for immunization: Secondary | ICD-10-CM | POA: Diagnosis not present

## 2016-12-31 DIAGNOSIS — Z79899 Other long term (current) drug therapy: Secondary | ICD-10-CM | POA: Diagnosis not present

## 2017-01-06 DIAGNOSIS — E1140 Type 2 diabetes mellitus with diabetic neuropathy, unspecified: Secondary | ICD-10-CM | POA: Diagnosis not present

## 2017-01-06 DIAGNOSIS — L84 Corns and callosities: Secondary | ICD-10-CM | POA: Insufficient documentation

## 2017-01-06 HISTORY — DX: Corns and callosities: L84

## 2017-01-06 HISTORY — DX: Type 2 diabetes mellitus with diabetic neuropathy, unspecified: E11.40

## 2017-01-29 DIAGNOSIS — R609 Edema, unspecified: Secondary | ICD-10-CM | POA: Diagnosis not present

## 2017-01-29 DIAGNOSIS — M25579 Pain in unspecified ankle and joints of unspecified foot: Secondary | ICD-10-CM | POA: Diagnosis not present

## 2017-01-29 DIAGNOSIS — Z6822 Body mass index (BMI) 22.0-22.9, adult: Secondary | ICD-10-CM | POA: Diagnosis not present

## 2017-01-29 DIAGNOSIS — E1140 Type 2 diabetes mellitus with diabetic neuropathy, unspecified: Secondary | ICD-10-CM | POA: Diagnosis not present

## 2017-01-29 DIAGNOSIS — R600 Localized edema: Secondary | ICD-10-CM | POA: Diagnosis not present

## 2017-01-29 DIAGNOSIS — E782 Mixed hyperlipidemia: Secondary | ICD-10-CM | POA: Diagnosis not present

## 2017-01-29 DIAGNOSIS — I10 Essential (primary) hypertension: Secondary | ICD-10-CM | POA: Diagnosis not present

## 2017-01-29 DIAGNOSIS — M25571 Pain in right ankle and joints of right foot: Secondary | ICD-10-CM | POA: Diagnosis not present

## 2017-02-14 DIAGNOSIS — I10 Essential (primary) hypertension: Secondary | ICD-10-CM | POA: Diagnosis not present

## 2017-02-14 DIAGNOSIS — J4530 Mild persistent asthma, uncomplicated: Secondary | ICD-10-CM | POA: Diagnosis not present

## 2017-02-14 DIAGNOSIS — M1991 Primary osteoarthritis, unspecified site: Secondary | ICD-10-CM | POA: Diagnosis not present

## 2017-02-14 DIAGNOSIS — E785 Hyperlipidemia, unspecified: Secondary | ICD-10-CM | POA: Diagnosis not present

## 2017-02-14 DIAGNOSIS — J309 Allergic rhinitis, unspecified: Secondary | ICD-10-CM | POA: Diagnosis not present

## 2017-02-14 DIAGNOSIS — E1151 Type 2 diabetes mellitus with diabetic peripheral angiopathy without gangrene: Secondary | ICD-10-CM | POA: Diagnosis not present

## 2017-02-14 DIAGNOSIS — E1140 Type 2 diabetes mellitus with diabetic neuropathy, unspecified: Secondary | ICD-10-CM | POA: Diagnosis not present

## 2017-02-14 DIAGNOSIS — R413 Other amnesia: Secondary | ICD-10-CM | POA: Diagnosis not present

## 2017-02-14 DIAGNOSIS — K7581 Nonalcoholic steatohepatitis (NASH): Secondary | ICD-10-CM | POA: Diagnosis not present

## 2017-02-21 DIAGNOSIS — K7581 Nonalcoholic steatohepatitis (NASH): Secondary | ICD-10-CM | POA: Diagnosis not present

## 2017-02-21 DIAGNOSIS — E1151 Type 2 diabetes mellitus with diabetic peripheral angiopathy without gangrene: Secondary | ICD-10-CM | POA: Diagnosis not present

## 2017-02-21 DIAGNOSIS — E785 Hyperlipidemia, unspecified: Secondary | ICD-10-CM | POA: Diagnosis not present

## 2017-02-21 DIAGNOSIS — M1991 Primary osteoarthritis, unspecified site: Secondary | ICD-10-CM | POA: Diagnosis not present

## 2017-02-21 DIAGNOSIS — E1140 Type 2 diabetes mellitus with diabetic neuropathy, unspecified: Secondary | ICD-10-CM | POA: Diagnosis not present

## 2017-02-21 DIAGNOSIS — J4530 Mild persistent asthma, uncomplicated: Secondary | ICD-10-CM | POA: Diagnosis not present

## 2017-02-21 DIAGNOSIS — I10 Essential (primary) hypertension: Secondary | ICD-10-CM | POA: Diagnosis not present

## 2017-02-21 DIAGNOSIS — R413 Other amnesia: Secondary | ICD-10-CM | POA: Diagnosis not present

## 2017-02-21 DIAGNOSIS — J309 Allergic rhinitis, unspecified: Secondary | ICD-10-CM | POA: Diagnosis not present

## 2017-02-27 DIAGNOSIS — E1151 Type 2 diabetes mellitus with diabetic peripheral angiopathy without gangrene: Secondary | ICD-10-CM | POA: Diagnosis not present

## 2017-02-27 DIAGNOSIS — R413 Other amnesia: Secondary | ICD-10-CM | POA: Diagnosis not present

## 2017-02-27 DIAGNOSIS — K7581 Nonalcoholic steatohepatitis (NASH): Secondary | ICD-10-CM | POA: Diagnosis not present

## 2017-02-27 DIAGNOSIS — M1991 Primary osteoarthritis, unspecified site: Secondary | ICD-10-CM | POA: Diagnosis not present

## 2017-02-27 DIAGNOSIS — I10 Essential (primary) hypertension: Secondary | ICD-10-CM | POA: Diagnosis not present

## 2017-02-27 DIAGNOSIS — J309 Allergic rhinitis, unspecified: Secondary | ICD-10-CM | POA: Diagnosis not present

## 2017-02-27 DIAGNOSIS — J4530 Mild persistent asthma, uncomplicated: Secondary | ICD-10-CM | POA: Diagnosis not present

## 2017-02-27 DIAGNOSIS — E1140 Type 2 diabetes mellitus with diabetic neuropathy, unspecified: Secondary | ICD-10-CM | POA: Diagnosis not present

## 2017-02-27 DIAGNOSIS — E785 Hyperlipidemia, unspecified: Secondary | ICD-10-CM | POA: Diagnosis not present

## 2017-03-05 DIAGNOSIS — E782 Mixed hyperlipidemia: Secondary | ICD-10-CM | POA: Diagnosis not present

## 2017-03-05 DIAGNOSIS — I10 Essential (primary) hypertension: Secondary | ICD-10-CM | POA: Diagnosis not present

## 2017-03-05 DIAGNOSIS — Z6822 Body mass index (BMI) 22.0-22.9, adult: Secondary | ICD-10-CM | POA: Diagnosis not present

## 2017-03-05 DIAGNOSIS — E1140 Type 2 diabetes mellitus with diabetic neuropathy, unspecified: Secondary | ICD-10-CM | POA: Diagnosis not present

## 2017-03-05 DIAGNOSIS — M1990 Unspecified osteoarthritis, unspecified site: Secondary | ICD-10-CM | POA: Diagnosis not present

## 2017-03-05 DIAGNOSIS — I739 Peripheral vascular disease, unspecified: Secondary | ICD-10-CM | POA: Diagnosis not present

## 2017-03-05 DIAGNOSIS — R490 Dysphonia: Secondary | ICD-10-CM | POA: Diagnosis not present

## 2017-03-05 DIAGNOSIS — K760 Fatty (change of) liver, not elsewhere classified: Secondary | ICD-10-CM | POA: Diagnosis not present

## 2017-03-05 DIAGNOSIS — J309 Allergic rhinitis, unspecified: Secondary | ICD-10-CM | POA: Diagnosis not present

## 2017-03-07 DIAGNOSIS — I10 Essential (primary) hypertension: Secondary | ICD-10-CM | POA: Diagnosis not present

## 2017-03-07 DIAGNOSIS — E1151 Type 2 diabetes mellitus with diabetic peripheral angiopathy without gangrene: Secondary | ICD-10-CM | POA: Diagnosis not present

## 2017-03-07 DIAGNOSIS — K7581 Nonalcoholic steatohepatitis (NASH): Secondary | ICD-10-CM | POA: Diagnosis not present

## 2017-03-07 DIAGNOSIS — R413 Other amnesia: Secondary | ICD-10-CM | POA: Diagnosis not present

## 2017-03-07 DIAGNOSIS — E785 Hyperlipidemia, unspecified: Secondary | ICD-10-CM | POA: Diagnosis not present

## 2017-03-07 DIAGNOSIS — J309 Allergic rhinitis, unspecified: Secondary | ICD-10-CM | POA: Diagnosis not present

## 2017-03-07 DIAGNOSIS — E1140 Type 2 diabetes mellitus with diabetic neuropathy, unspecified: Secondary | ICD-10-CM | POA: Diagnosis not present

## 2017-03-07 DIAGNOSIS — J4530 Mild persistent asthma, uncomplicated: Secondary | ICD-10-CM | POA: Diagnosis not present

## 2017-03-07 DIAGNOSIS — M1991 Primary osteoarthritis, unspecified site: Secondary | ICD-10-CM | POA: Diagnosis not present

## 2017-03-14 DIAGNOSIS — J4530 Mild persistent asthma, uncomplicated: Secondary | ICD-10-CM | POA: Diagnosis not present

## 2017-03-14 DIAGNOSIS — J309 Allergic rhinitis, unspecified: Secondary | ICD-10-CM | POA: Diagnosis not present

## 2017-03-14 DIAGNOSIS — I10 Essential (primary) hypertension: Secondary | ICD-10-CM | POA: Diagnosis not present

## 2017-03-14 DIAGNOSIS — M1991 Primary osteoarthritis, unspecified site: Secondary | ICD-10-CM | POA: Diagnosis not present

## 2017-03-14 DIAGNOSIS — R413 Other amnesia: Secondary | ICD-10-CM | POA: Diagnosis not present

## 2017-03-14 DIAGNOSIS — K7581 Nonalcoholic steatohepatitis (NASH): Secondary | ICD-10-CM | POA: Diagnosis not present

## 2017-03-14 DIAGNOSIS — E785 Hyperlipidemia, unspecified: Secondary | ICD-10-CM | POA: Diagnosis not present

## 2017-03-14 DIAGNOSIS — E1140 Type 2 diabetes mellitus with diabetic neuropathy, unspecified: Secondary | ICD-10-CM | POA: Diagnosis not present

## 2017-03-14 DIAGNOSIS — E1151 Type 2 diabetes mellitus with diabetic peripheral angiopathy without gangrene: Secondary | ICD-10-CM | POA: Diagnosis not present

## 2017-03-19 DIAGNOSIS — H6120 Impacted cerumen, unspecified ear: Secondary | ICD-10-CM | POA: Diagnosis not present

## 2017-03-19 DIAGNOSIS — R42 Dizziness and giddiness: Secondary | ICD-10-CM | POA: Diagnosis not present

## 2017-03-19 DIAGNOSIS — E1140 Type 2 diabetes mellitus with diabetic neuropathy, unspecified: Secondary | ICD-10-CM | POA: Diagnosis not present

## 2017-03-19 DIAGNOSIS — I10 Essential (primary) hypertension: Secondary | ICD-10-CM | POA: Diagnosis not present

## 2017-03-19 DIAGNOSIS — Z6822 Body mass index (BMI) 22.0-22.9, adult: Secondary | ICD-10-CM | POA: Diagnosis not present

## 2017-03-20 DIAGNOSIS — M1991 Primary osteoarthritis, unspecified site: Secondary | ICD-10-CM | POA: Diagnosis not present

## 2017-03-20 DIAGNOSIS — E1140 Type 2 diabetes mellitus with diabetic neuropathy, unspecified: Secondary | ICD-10-CM | POA: Diagnosis not present

## 2017-03-20 DIAGNOSIS — I10 Essential (primary) hypertension: Secondary | ICD-10-CM | POA: Diagnosis not present

## 2017-03-20 DIAGNOSIS — J309 Allergic rhinitis, unspecified: Secondary | ICD-10-CM | POA: Diagnosis not present

## 2017-03-20 DIAGNOSIS — E785 Hyperlipidemia, unspecified: Secondary | ICD-10-CM | POA: Diagnosis not present

## 2017-03-20 DIAGNOSIS — E1151 Type 2 diabetes mellitus with diabetic peripheral angiopathy without gangrene: Secondary | ICD-10-CM | POA: Diagnosis not present

## 2017-03-20 DIAGNOSIS — K7581 Nonalcoholic steatohepatitis (NASH): Secondary | ICD-10-CM | POA: Diagnosis not present

## 2017-03-20 DIAGNOSIS — R413 Other amnesia: Secondary | ICD-10-CM | POA: Diagnosis not present

## 2017-03-20 DIAGNOSIS — J4530 Mild persistent asthma, uncomplicated: Secondary | ICD-10-CM | POA: Diagnosis not present

## 2017-03-25 DIAGNOSIS — I10 Essential (primary) hypertension: Secondary | ICD-10-CM | POA: Diagnosis not present

## 2017-03-25 DIAGNOSIS — H6120 Impacted cerumen, unspecified ear: Secondary | ICD-10-CM | POA: Diagnosis not present

## 2017-03-25 DIAGNOSIS — E039 Hypothyroidism, unspecified: Secondary | ICD-10-CM | POA: Diagnosis not present

## 2017-03-25 DIAGNOSIS — J309 Allergic rhinitis, unspecified: Secondary | ICD-10-CM | POA: Diagnosis not present

## 2017-03-25 DIAGNOSIS — Z6822 Body mass index (BMI) 22.0-22.9, adult: Secondary | ICD-10-CM | POA: Diagnosis not present

## 2017-03-25 DIAGNOSIS — R270 Ataxia, unspecified: Secondary | ICD-10-CM | POA: Diagnosis not present

## 2017-03-27 DIAGNOSIS — E1151 Type 2 diabetes mellitus with diabetic peripheral angiopathy without gangrene: Secondary | ICD-10-CM | POA: Diagnosis not present

## 2017-03-27 DIAGNOSIS — K7581 Nonalcoholic steatohepatitis (NASH): Secondary | ICD-10-CM | POA: Diagnosis not present

## 2017-03-27 DIAGNOSIS — J4530 Mild persistent asthma, uncomplicated: Secondary | ICD-10-CM | POA: Diagnosis not present

## 2017-03-27 DIAGNOSIS — E1140 Type 2 diabetes mellitus with diabetic neuropathy, unspecified: Secondary | ICD-10-CM | POA: Diagnosis not present

## 2017-03-27 DIAGNOSIS — R413 Other amnesia: Secondary | ICD-10-CM | POA: Diagnosis not present

## 2017-03-27 DIAGNOSIS — J309 Allergic rhinitis, unspecified: Secondary | ICD-10-CM | POA: Diagnosis not present

## 2017-03-27 DIAGNOSIS — E785 Hyperlipidemia, unspecified: Secondary | ICD-10-CM | POA: Diagnosis not present

## 2017-03-27 DIAGNOSIS — I10 Essential (primary) hypertension: Secondary | ICD-10-CM | POA: Diagnosis not present

## 2017-03-27 DIAGNOSIS — M1991 Primary osteoarthritis, unspecified site: Secondary | ICD-10-CM | POA: Diagnosis not present

## 2017-03-31 DIAGNOSIS — H6121 Impacted cerumen, right ear: Secondary | ICD-10-CM | POA: Diagnosis not present

## 2017-04-04 DIAGNOSIS — J4530 Mild persistent asthma, uncomplicated: Secondary | ICD-10-CM | POA: Diagnosis not present

## 2017-04-04 DIAGNOSIS — J309 Allergic rhinitis, unspecified: Secondary | ICD-10-CM | POA: Diagnosis not present

## 2017-04-04 DIAGNOSIS — M1991 Primary osteoarthritis, unspecified site: Secondary | ICD-10-CM | POA: Diagnosis not present

## 2017-04-04 DIAGNOSIS — E1140 Type 2 diabetes mellitus with diabetic neuropathy, unspecified: Secondary | ICD-10-CM | POA: Diagnosis not present

## 2017-04-04 DIAGNOSIS — K7581 Nonalcoholic steatohepatitis (NASH): Secondary | ICD-10-CM | POA: Diagnosis not present

## 2017-04-04 DIAGNOSIS — R413 Other amnesia: Secondary | ICD-10-CM | POA: Diagnosis not present

## 2017-04-04 DIAGNOSIS — E785 Hyperlipidemia, unspecified: Secondary | ICD-10-CM | POA: Diagnosis not present

## 2017-04-04 DIAGNOSIS — E1151 Type 2 diabetes mellitus with diabetic peripheral angiopathy without gangrene: Secondary | ICD-10-CM | POA: Diagnosis not present

## 2017-04-04 DIAGNOSIS — I10 Essential (primary) hypertension: Secondary | ICD-10-CM | POA: Diagnosis not present

## 2017-04-10 DIAGNOSIS — I10 Essential (primary) hypertension: Secondary | ICD-10-CM | POA: Diagnosis not present

## 2017-04-10 DIAGNOSIS — J309 Allergic rhinitis, unspecified: Secondary | ICD-10-CM | POA: Diagnosis not present

## 2017-04-10 DIAGNOSIS — J4530 Mild persistent asthma, uncomplicated: Secondary | ICD-10-CM | POA: Diagnosis not present

## 2017-04-10 DIAGNOSIS — M1991 Primary osteoarthritis, unspecified site: Secondary | ICD-10-CM | POA: Diagnosis not present

## 2017-04-10 DIAGNOSIS — E1140 Type 2 diabetes mellitus with diabetic neuropathy, unspecified: Secondary | ICD-10-CM | POA: Diagnosis not present

## 2017-04-10 DIAGNOSIS — R413 Other amnesia: Secondary | ICD-10-CM | POA: Diagnosis not present

## 2017-04-10 DIAGNOSIS — E1151 Type 2 diabetes mellitus with diabetic peripheral angiopathy without gangrene: Secondary | ICD-10-CM | POA: Diagnosis not present

## 2017-04-10 DIAGNOSIS — E785 Hyperlipidemia, unspecified: Secondary | ICD-10-CM | POA: Diagnosis not present

## 2017-04-10 DIAGNOSIS — K7581 Nonalcoholic steatohepatitis (NASH): Secondary | ICD-10-CM | POA: Diagnosis not present

## 2017-05-20 DIAGNOSIS — J309 Allergic rhinitis, unspecified: Secondary | ICD-10-CM | POA: Diagnosis not present

## 2017-05-20 DIAGNOSIS — E039 Hypothyroidism, unspecified: Secondary | ICD-10-CM | POA: Diagnosis not present

## 2017-05-20 DIAGNOSIS — E782 Mixed hyperlipidemia: Secondary | ICD-10-CM | POA: Diagnosis not present

## 2017-05-20 DIAGNOSIS — R600 Localized edema: Secondary | ICD-10-CM | POA: Diagnosis not present

## 2017-05-20 DIAGNOSIS — I739 Peripheral vascular disease, unspecified: Secondary | ICD-10-CM | POA: Diagnosis not present

## 2017-05-20 DIAGNOSIS — R270 Ataxia, unspecified: Secondary | ICD-10-CM | POA: Diagnosis not present

## 2017-05-20 DIAGNOSIS — K760 Fatty (change of) liver, not elsewhere classified: Secondary | ICD-10-CM | POA: Diagnosis not present

## 2017-05-20 DIAGNOSIS — E1140 Type 2 diabetes mellitus with diabetic neuropathy, unspecified: Secondary | ICD-10-CM | POA: Diagnosis not present

## 2017-05-20 DIAGNOSIS — Z79899 Other long term (current) drug therapy: Secondary | ICD-10-CM | POA: Diagnosis not present

## 2017-05-20 DIAGNOSIS — I10 Essential (primary) hypertension: Secondary | ICD-10-CM | POA: Diagnosis not present

## 2017-05-20 DIAGNOSIS — Z6822 Body mass index (BMI) 22.0-22.9, adult: Secondary | ICD-10-CM | POA: Diagnosis not present

## 2017-05-20 DIAGNOSIS — E559 Vitamin D deficiency, unspecified: Secondary | ICD-10-CM | POA: Diagnosis not present

## 2017-05-24 DIAGNOSIS — I10 Essential (primary) hypertension: Secondary | ICD-10-CM | POA: Diagnosis not present

## 2017-05-24 DIAGNOSIS — M1991 Primary osteoarthritis, unspecified site: Secondary | ICD-10-CM | POA: Diagnosis not present

## 2017-05-24 DIAGNOSIS — J4530 Mild persistent asthma, uncomplicated: Secondary | ICD-10-CM | POA: Diagnosis not present

## 2017-05-24 DIAGNOSIS — E1140 Type 2 diabetes mellitus with diabetic neuropathy, unspecified: Secondary | ICD-10-CM | POA: Diagnosis not present

## 2017-05-24 DIAGNOSIS — E785 Hyperlipidemia, unspecified: Secondary | ICD-10-CM | POA: Diagnosis not present

## 2017-05-24 DIAGNOSIS — R413 Other amnesia: Secondary | ICD-10-CM | POA: Diagnosis not present

## 2017-05-24 DIAGNOSIS — J309 Allergic rhinitis, unspecified: Secondary | ICD-10-CM | POA: Diagnosis not present

## 2017-05-24 DIAGNOSIS — E1151 Type 2 diabetes mellitus with diabetic peripheral angiopathy without gangrene: Secondary | ICD-10-CM | POA: Diagnosis not present

## 2017-05-24 DIAGNOSIS — K7581 Nonalcoholic steatohepatitis (NASH): Secondary | ICD-10-CM | POA: Diagnosis not present

## 2017-05-26 DIAGNOSIS — M1991 Primary osteoarthritis, unspecified site: Secondary | ICD-10-CM | POA: Diagnosis not present

## 2017-05-26 DIAGNOSIS — R413 Other amnesia: Secondary | ICD-10-CM | POA: Diagnosis not present

## 2017-05-26 DIAGNOSIS — I10 Essential (primary) hypertension: Secondary | ICD-10-CM | POA: Diagnosis not present

## 2017-05-26 DIAGNOSIS — E1140 Type 2 diabetes mellitus with diabetic neuropathy, unspecified: Secondary | ICD-10-CM | POA: Diagnosis not present

## 2017-05-26 DIAGNOSIS — E785 Hyperlipidemia, unspecified: Secondary | ICD-10-CM | POA: Diagnosis not present

## 2017-05-26 DIAGNOSIS — J4530 Mild persistent asthma, uncomplicated: Secondary | ICD-10-CM | POA: Diagnosis not present

## 2017-05-26 DIAGNOSIS — K7581 Nonalcoholic steatohepatitis (NASH): Secondary | ICD-10-CM | POA: Diagnosis not present

## 2017-05-26 DIAGNOSIS — J309 Allergic rhinitis, unspecified: Secondary | ICD-10-CM | POA: Diagnosis not present

## 2017-05-26 DIAGNOSIS — E1151 Type 2 diabetes mellitus with diabetic peripheral angiopathy without gangrene: Secondary | ICD-10-CM | POA: Diagnosis not present

## 2017-06-02 DIAGNOSIS — K7581 Nonalcoholic steatohepatitis (NASH): Secondary | ICD-10-CM | POA: Diagnosis not present

## 2017-06-02 DIAGNOSIS — E1140 Type 2 diabetes mellitus with diabetic neuropathy, unspecified: Secondary | ICD-10-CM | POA: Diagnosis not present

## 2017-06-02 DIAGNOSIS — R413 Other amnesia: Secondary | ICD-10-CM | POA: Diagnosis not present

## 2017-06-02 DIAGNOSIS — E1151 Type 2 diabetes mellitus with diabetic peripheral angiopathy without gangrene: Secondary | ICD-10-CM | POA: Diagnosis not present

## 2017-06-02 DIAGNOSIS — J309 Allergic rhinitis, unspecified: Secondary | ICD-10-CM | POA: Diagnosis not present

## 2017-06-02 DIAGNOSIS — I10 Essential (primary) hypertension: Secondary | ICD-10-CM | POA: Diagnosis not present

## 2017-06-02 DIAGNOSIS — M1991 Primary osteoarthritis, unspecified site: Secondary | ICD-10-CM | POA: Diagnosis not present

## 2017-06-02 DIAGNOSIS — J4530 Mild persistent asthma, uncomplicated: Secondary | ICD-10-CM | POA: Diagnosis not present

## 2017-06-02 DIAGNOSIS — E785 Hyperlipidemia, unspecified: Secondary | ICD-10-CM | POA: Diagnosis not present

## 2017-06-04 DIAGNOSIS — E559 Vitamin D deficiency, unspecified: Secondary | ICD-10-CM | POA: Diagnosis not present

## 2017-06-04 DIAGNOSIS — E785 Hyperlipidemia, unspecified: Secondary | ICD-10-CM | POA: Diagnosis not present

## 2017-06-04 DIAGNOSIS — M79605 Pain in left leg: Secondary | ICD-10-CM | POA: Diagnosis not present

## 2017-06-04 DIAGNOSIS — Z1331 Encounter for screening for depression: Secondary | ICD-10-CM | POA: Diagnosis not present

## 2017-06-04 DIAGNOSIS — R600 Localized edema: Secondary | ICD-10-CM | POA: Diagnosis not present

## 2017-06-04 DIAGNOSIS — Z0000 Encounter for general adult medical examination without abnormal findings: Secondary | ICD-10-CM | POA: Diagnosis not present

## 2017-06-04 DIAGNOSIS — I10 Essential (primary) hypertension: Secondary | ICD-10-CM | POA: Diagnosis not present

## 2017-06-04 DIAGNOSIS — Z139 Encounter for screening, unspecified: Secondary | ICD-10-CM | POA: Diagnosis not present

## 2017-06-04 DIAGNOSIS — M7989 Other specified soft tissue disorders: Secondary | ICD-10-CM | POA: Diagnosis not present

## 2017-06-04 DIAGNOSIS — Z9181 History of falling: Secondary | ICD-10-CM | POA: Diagnosis not present

## 2017-06-04 DIAGNOSIS — Z6822 Body mass index (BMI) 22.0-22.9, adult: Secondary | ICD-10-CM | POA: Diagnosis not present

## 2017-06-04 DIAGNOSIS — M79604 Pain in right leg: Secondary | ICD-10-CM | POA: Diagnosis not present

## 2017-06-06 DIAGNOSIS — E1151 Type 2 diabetes mellitus with diabetic peripheral angiopathy without gangrene: Secondary | ICD-10-CM | POA: Diagnosis not present

## 2017-06-06 DIAGNOSIS — J4530 Mild persistent asthma, uncomplicated: Secondary | ICD-10-CM | POA: Diagnosis not present

## 2017-06-06 DIAGNOSIS — I10 Essential (primary) hypertension: Secondary | ICD-10-CM | POA: Diagnosis not present

## 2017-06-06 DIAGNOSIS — M1991 Primary osteoarthritis, unspecified site: Secondary | ICD-10-CM | POA: Diagnosis not present

## 2017-06-06 DIAGNOSIS — E1140 Type 2 diabetes mellitus with diabetic neuropathy, unspecified: Secondary | ICD-10-CM | POA: Diagnosis not present

## 2017-06-06 DIAGNOSIS — E785 Hyperlipidemia, unspecified: Secondary | ICD-10-CM | POA: Diagnosis not present

## 2017-06-06 DIAGNOSIS — K7581 Nonalcoholic steatohepatitis (NASH): Secondary | ICD-10-CM | POA: Diagnosis not present

## 2017-06-06 DIAGNOSIS — R413 Other amnesia: Secondary | ICD-10-CM | POA: Diagnosis not present

## 2017-06-06 DIAGNOSIS — J309 Allergic rhinitis, unspecified: Secondary | ICD-10-CM | POA: Diagnosis not present

## 2017-06-08 DIAGNOSIS — I10 Essential (primary) hypertension: Secondary | ICD-10-CM | POA: Diagnosis not present

## 2017-06-08 DIAGNOSIS — J309 Allergic rhinitis, unspecified: Secondary | ICD-10-CM | POA: Diagnosis not present

## 2017-06-08 DIAGNOSIS — K7581 Nonalcoholic steatohepatitis (NASH): Secondary | ICD-10-CM | POA: Diagnosis not present

## 2017-06-08 DIAGNOSIS — E1151 Type 2 diabetes mellitus with diabetic peripheral angiopathy without gangrene: Secondary | ICD-10-CM | POA: Diagnosis not present

## 2017-06-08 DIAGNOSIS — M1991 Primary osteoarthritis, unspecified site: Secondary | ICD-10-CM | POA: Diagnosis not present

## 2017-06-08 DIAGNOSIS — R413 Other amnesia: Secondary | ICD-10-CM | POA: Diagnosis not present

## 2017-06-08 DIAGNOSIS — J4530 Mild persistent asthma, uncomplicated: Secondary | ICD-10-CM | POA: Diagnosis not present

## 2017-06-08 DIAGNOSIS — E785 Hyperlipidemia, unspecified: Secondary | ICD-10-CM | POA: Diagnosis not present

## 2017-06-08 DIAGNOSIS — E1140 Type 2 diabetes mellitus with diabetic neuropathy, unspecified: Secondary | ICD-10-CM | POA: Diagnosis not present

## 2017-06-11 DIAGNOSIS — I10 Essential (primary) hypertension: Secondary | ICD-10-CM | POA: Diagnosis not present

## 2017-06-11 DIAGNOSIS — E1140 Type 2 diabetes mellitus with diabetic neuropathy, unspecified: Secondary | ICD-10-CM | POA: Diagnosis not present

## 2017-06-11 DIAGNOSIS — E1151 Type 2 diabetes mellitus with diabetic peripheral angiopathy without gangrene: Secondary | ICD-10-CM | POA: Diagnosis not present

## 2017-06-11 DIAGNOSIS — M1991 Primary osteoarthritis, unspecified site: Secondary | ICD-10-CM | POA: Diagnosis not present

## 2017-06-11 DIAGNOSIS — J309 Allergic rhinitis, unspecified: Secondary | ICD-10-CM | POA: Diagnosis not present

## 2017-06-11 DIAGNOSIS — R413 Other amnesia: Secondary | ICD-10-CM | POA: Diagnosis not present

## 2017-06-11 DIAGNOSIS — J4530 Mild persistent asthma, uncomplicated: Secondary | ICD-10-CM | POA: Diagnosis not present

## 2017-06-11 DIAGNOSIS — E785 Hyperlipidemia, unspecified: Secondary | ICD-10-CM | POA: Diagnosis not present

## 2017-06-11 DIAGNOSIS — K7581 Nonalcoholic steatohepatitis (NASH): Secondary | ICD-10-CM | POA: Diagnosis not present

## 2017-06-17 DIAGNOSIS — I10 Essential (primary) hypertension: Secondary | ICD-10-CM | POA: Diagnosis not present

## 2017-06-17 DIAGNOSIS — R413 Other amnesia: Secondary | ICD-10-CM | POA: Diagnosis not present

## 2017-06-17 DIAGNOSIS — K7581 Nonalcoholic steatohepatitis (NASH): Secondary | ICD-10-CM | POA: Diagnosis not present

## 2017-06-17 DIAGNOSIS — J309 Allergic rhinitis, unspecified: Secondary | ICD-10-CM | POA: Diagnosis not present

## 2017-06-17 DIAGNOSIS — J4530 Mild persistent asthma, uncomplicated: Secondary | ICD-10-CM | POA: Diagnosis not present

## 2017-06-17 DIAGNOSIS — E1140 Type 2 diabetes mellitus with diabetic neuropathy, unspecified: Secondary | ICD-10-CM | POA: Diagnosis not present

## 2017-06-17 DIAGNOSIS — E785 Hyperlipidemia, unspecified: Secondary | ICD-10-CM | POA: Diagnosis not present

## 2017-06-17 DIAGNOSIS — E1151 Type 2 diabetes mellitus with diabetic peripheral angiopathy without gangrene: Secondary | ICD-10-CM | POA: Diagnosis not present

## 2017-06-17 DIAGNOSIS — M1991 Primary osteoarthritis, unspecified site: Secondary | ICD-10-CM | POA: Diagnosis not present

## 2017-06-19 DIAGNOSIS — I10 Essential (primary) hypertension: Secondary | ICD-10-CM | POA: Diagnosis not present

## 2017-06-19 DIAGNOSIS — E1140 Type 2 diabetes mellitus with diabetic neuropathy, unspecified: Secondary | ICD-10-CM | POA: Diagnosis not present

## 2017-06-19 DIAGNOSIS — M1991 Primary osteoarthritis, unspecified site: Secondary | ICD-10-CM | POA: Diagnosis not present

## 2017-06-19 DIAGNOSIS — E785 Hyperlipidemia, unspecified: Secondary | ICD-10-CM | POA: Diagnosis not present

## 2017-06-19 DIAGNOSIS — J4530 Mild persistent asthma, uncomplicated: Secondary | ICD-10-CM | POA: Diagnosis not present

## 2017-06-19 DIAGNOSIS — E1151 Type 2 diabetes mellitus with diabetic peripheral angiopathy without gangrene: Secondary | ICD-10-CM | POA: Diagnosis not present

## 2017-06-19 DIAGNOSIS — J309 Allergic rhinitis, unspecified: Secondary | ICD-10-CM | POA: Diagnosis not present

## 2017-06-19 DIAGNOSIS — R413 Other amnesia: Secondary | ICD-10-CM | POA: Diagnosis not present

## 2017-06-19 DIAGNOSIS — K7581 Nonalcoholic steatohepatitis (NASH): Secondary | ICD-10-CM | POA: Diagnosis not present

## 2017-08-20 DIAGNOSIS — R748 Abnormal levels of other serum enzymes: Secondary | ICD-10-CM | POA: Diagnosis not present

## 2017-08-20 DIAGNOSIS — Z1339 Encounter for screening examination for other mental health and behavioral disorders: Secondary | ICD-10-CM | POA: Diagnosis not present

## 2017-08-20 DIAGNOSIS — Z23 Encounter for immunization: Secondary | ICD-10-CM | POA: Diagnosis not present

## 2017-08-20 DIAGNOSIS — I10 Essential (primary) hypertension: Secondary | ICD-10-CM | POA: Diagnosis not present

## 2017-08-20 DIAGNOSIS — Z6821 Body mass index (BMI) 21.0-21.9, adult: Secondary | ICD-10-CM | POA: Diagnosis not present

## 2017-08-20 DIAGNOSIS — E782 Mixed hyperlipidemia: Secondary | ICD-10-CM | POA: Diagnosis not present

## 2017-08-20 DIAGNOSIS — Z1331 Encounter for screening for depression: Secondary | ICD-10-CM | POA: Diagnosis not present

## 2017-08-20 DIAGNOSIS — M1990 Unspecified osteoarthritis, unspecified site: Secondary | ICD-10-CM | POA: Diagnosis not present

## 2017-08-20 DIAGNOSIS — K760 Fatty (change of) liver, not elsewhere classified: Secondary | ICD-10-CM | POA: Diagnosis not present

## 2017-10-24 DIAGNOSIS — I10 Essential (primary) hypertension: Secondary | ICD-10-CM | POA: Diagnosis not present

## 2017-10-24 DIAGNOSIS — R5381 Other malaise: Secondary | ICD-10-CM | POA: Diagnosis not present

## 2017-10-24 DIAGNOSIS — R69 Illness, unspecified: Secondary | ICD-10-CM | POA: Diagnosis not present

## 2017-10-24 DIAGNOSIS — E7800 Pure hypercholesterolemia, unspecified: Secondary | ICD-10-CM | POA: Diagnosis not present

## 2017-10-24 DIAGNOSIS — F0390 Unspecified dementia without behavioral disturbance: Secondary | ICD-10-CM | POA: Diagnosis not present

## 2017-10-24 DIAGNOSIS — Z7982 Long term (current) use of aspirin: Secondary | ICD-10-CM | POA: Diagnosis not present

## 2017-10-24 DIAGNOSIS — M1990 Unspecified osteoarthritis, unspecified site: Secondary | ICD-10-CM | POA: Diagnosis not present

## 2017-10-24 DIAGNOSIS — I482 Chronic atrial fibrillation: Secondary | ICD-10-CM | POA: Diagnosis not present

## 2017-10-24 DIAGNOSIS — E119 Type 2 diabetes mellitus without complications: Secondary | ICD-10-CM | POA: Diagnosis not present

## 2017-10-24 DIAGNOSIS — K7581 Nonalcoholic steatohepatitis (NASH): Secondary | ICD-10-CM | POA: Diagnosis not present

## 2017-10-24 DIAGNOSIS — N390 Urinary tract infection, site not specified: Secondary | ICD-10-CM | POA: Diagnosis not present

## 2017-10-24 DIAGNOSIS — I350 Nonrheumatic aortic (valve) stenosis: Secondary | ICD-10-CM | POA: Diagnosis not present

## 2017-10-24 DIAGNOSIS — E785 Hyperlipidemia, unspecified: Secondary | ICD-10-CM | POA: Diagnosis not present

## 2017-10-24 DIAGNOSIS — R4182 Altered mental status, unspecified: Secondary | ICD-10-CM | POA: Diagnosis not present

## 2017-10-24 DIAGNOSIS — I480 Paroxysmal atrial fibrillation: Secondary | ICD-10-CM | POA: Diagnosis not present

## 2017-10-24 DIAGNOSIS — R319 Hematuria, unspecified: Secondary | ICD-10-CM | POA: Diagnosis not present

## 2017-10-24 DIAGNOSIS — R079 Chest pain, unspecified: Secondary | ICD-10-CM | POA: Diagnosis not present

## 2017-10-24 DIAGNOSIS — E039 Hypothyroidism, unspecified: Secondary | ICD-10-CM | POA: Diagnosis not present

## 2017-10-24 DIAGNOSIS — I639 Cerebral infarction, unspecified: Secondary | ICD-10-CM | POA: Diagnosis not present

## 2017-10-24 DIAGNOSIS — B9689 Other specified bacterial agents as the cause of diseases classified elsewhere: Secondary | ICD-10-CM | POA: Diagnosis not present

## 2017-10-24 DIAGNOSIS — I4891 Unspecified atrial fibrillation: Secondary | ICD-10-CM | POA: Diagnosis not present

## 2017-10-25 DIAGNOSIS — N390 Urinary tract infection, site not specified: Secondary | ICD-10-CM | POA: Diagnosis not present

## 2017-10-25 DIAGNOSIS — E785 Hyperlipidemia, unspecified: Secondary | ICD-10-CM | POA: Diagnosis not present

## 2017-10-25 DIAGNOSIS — K7581 Nonalcoholic steatohepatitis (NASH): Secondary | ICD-10-CM | POA: Diagnosis not present

## 2017-10-25 DIAGNOSIS — F0390 Unspecified dementia without behavioral disturbance: Secondary | ICD-10-CM | POA: Diagnosis not present

## 2017-10-25 DIAGNOSIS — R319 Hematuria, unspecified: Secondary | ICD-10-CM | POA: Diagnosis not present

## 2017-10-25 DIAGNOSIS — Z7982 Long term (current) use of aspirin: Secondary | ICD-10-CM | POA: Diagnosis not present

## 2017-10-25 DIAGNOSIS — I10 Essential (primary) hypertension: Secondary | ICD-10-CM | POA: Diagnosis not present

## 2017-10-25 DIAGNOSIS — I480 Paroxysmal atrial fibrillation: Secondary | ICD-10-CM | POA: Diagnosis not present

## 2017-10-25 DIAGNOSIS — E119 Type 2 diabetes mellitus without complications: Secondary | ICD-10-CM | POA: Diagnosis not present

## 2017-10-26 DIAGNOSIS — I10 Essential (primary) hypertension: Secondary | ICD-10-CM | POA: Diagnosis not present

## 2017-10-26 DIAGNOSIS — Z7982 Long term (current) use of aspirin: Secondary | ICD-10-CM | POA: Diagnosis not present

## 2017-10-26 DIAGNOSIS — I4891 Unspecified atrial fibrillation: Secondary | ICD-10-CM

## 2017-10-26 DIAGNOSIS — N390 Urinary tract infection, site not specified: Secondary | ICD-10-CM | POA: Diagnosis not present

## 2017-10-26 DIAGNOSIS — R319 Hematuria, unspecified: Secondary | ICD-10-CM | POA: Diagnosis not present

## 2017-10-26 DIAGNOSIS — E119 Type 2 diabetes mellitus without complications: Secondary | ICD-10-CM | POA: Diagnosis not present

## 2017-10-26 DIAGNOSIS — F0390 Unspecified dementia without behavioral disturbance: Secondary | ICD-10-CM | POA: Diagnosis not present

## 2017-10-26 DIAGNOSIS — I480 Paroxysmal atrial fibrillation: Secondary | ICD-10-CM | POA: Diagnosis not present

## 2017-10-26 DIAGNOSIS — K7581 Nonalcoholic steatohepatitis (NASH): Secondary | ICD-10-CM | POA: Diagnosis not present

## 2017-10-26 DIAGNOSIS — E785 Hyperlipidemia, unspecified: Secondary | ICD-10-CM | POA: Diagnosis not present

## 2017-10-27 DIAGNOSIS — E1151 Type 2 diabetes mellitus with diabetic peripheral angiopathy without gangrene: Secondary | ICD-10-CM | POA: Diagnosis not present

## 2017-10-27 DIAGNOSIS — F0390 Unspecified dementia without behavioral disturbance: Secondary | ICD-10-CM | POA: Diagnosis not present

## 2017-10-27 DIAGNOSIS — E785 Hyperlipidemia, unspecified: Secondary | ICD-10-CM | POA: Diagnosis not present

## 2017-10-27 DIAGNOSIS — J4530 Mild persistent asthma, uncomplicated: Secondary | ICD-10-CM | POA: Diagnosis not present

## 2017-10-27 DIAGNOSIS — E1140 Type 2 diabetes mellitus with diabetic neuropathy, unspecified: Secondary | ICD-10-CM | POA: Diagnosis not present

## 2017-10-27 DIAGNOSIS — I4891 Unspecified atrial fibrillation: Secondary | ICD-10-CM | POA: Diagnosis not present

## 2017-10-27 DIAGNOSIS — I10 Essential (primary) hypertension: Secondary | ICD-10-CM | POA: Diagnosis not present

## 2017-10-27 DIAGNOSIS — M150 Primary generalized (osteo)arthritis: Secondary | ICD-10-CM | POA: Diagnosis not present

## 2017-10-27 DIAGNOSIS — N390 Urinary tract infection, site not specified: Secondary | ICD-10-CM | POA: Diagnosis not present

## 2017-10-28 DIAGNOSIS — I10 Essential (primary) hypertension: Secondary | ICD-10-CM | POA: Diagnosis not present

## 2017-10-28 DIAGNOSIS — J4530 Mild persistent asthma, uncomplicated: Secondary | ICD-10-CM | POA: Diagnosis not present

## 2017-10-28 DIAGNOSIS — E1140 Type 2 diabetes mellitus with diabetic neuropathy, unspecified: Secondary | ICD-10-CM | POA: Diagnosis not present

## 2017-10-28 DIAGNOSIS — E1151 Type 2 diabetes mellitus with diabetic peripheral angiopathy without gangrene: Secondary | ICD-10-CM | POA: Diagnosis not present

## 2017-10-28 DIAGNOSIS — N390 Urinary tract infection, site not specified: Secondary | ICD-10-CM | POA: Diagnosis not present

## 2017-10-28 DIAGNOSIS — M150 Primary generalized (osteo)arthritis: Secondary | ICD-10-CM | POA: Diagnosis not present

## 2017-10-28 DIAGNOSIS — I4891 Unspecified atrial fibrillation: Secondary | ICD-10-CM | POA: Diagnosis not present

## 2017-10-28 DIAGNOSIS — F0390 Unspecified dementia without behavioral disturbance: Secondary | ICD-10-CM | POA: Diagnosis not present

## 2017-10-28 DIAGNOSIS — E785 Hyperlipidemia, unspecified: Secondary | ICD-10-CM | POA: Diagnosis not present

## 2017-10-29 DIAGNOSIS — E559 Vitamin D deficiency, unspecified: Secondary | ICD-10-CM | POA: Diagnosis not present

## 2017-10-29 DIAGNOSIS — E039 Hypothyroidism, unspecified: Secondary | ICD-10-CM | POA: Diagnosis not present

## 2017-10-29 DIAGNOSIS — Z79899 Other long term (current) drug therapy: Secondary | ICD-10-CM | POA: Diagnosis not present

## 2017-10-29 DIAGNOSIS — I480 Paroxysmal atrial fibrillation: Secondary | ICD-10-CM | POA: Diagnosis not present

## 2017-10-29 DIAGNOSIS — E1151 Type 2 diabetes mellitus with diabetic peripheral angiopathy without gangrene: Secondary | ICD-10-CM | POA: Diagnosis not present

## 2017-10-29 DIAGNOSIS — M150 Primary generalized (osteo)arthritis: Secondary | ICD-10-CM | POA: Diagnosis not present

## 2017-10-29 DIAGNOSIS — I10 Essential (primary) hypertension: Secondary | ICD-10-CM | POA: Diagnosis not present

## 2017-10-29 DIAGNOSIS — N390 Urinary tract infection, site not specified: Secondary | ICD-10-CM | POA: Diagnosis not present

## 2017-10-29 DIAGNOSIS — E1140 Type 2 diabetes mellitus with diabetic neuropathy, unspecified: Secondary | ICD-10-CM | POA: Diagnosis not present

## 2017-10-29 DIAGNOSIS — Z6821 Body mass index (BMI) 21.0-21.9, adult: Secondary | ICD-10-CM | POA: Diagnosis not present

## 2017-10-29 DIAGNOSIS — J4530 Mild persistent asthma, uncomplicated: Secondary | ICD-10-CM | POA: Diagnosis not present

## 2017-10-29 DIAGNOSIS — L602 Onychogryphosis: Secondary | ICD-10-CM | POA: Diagnosis not present

## 2017-10-29 DIAGNOSIS — Z09 Encounter for follow-up examination after completed treatment for conditions other than malignant neoplasm: Secondary | ICD-10-CM | POA: Diagnosis not present

## 2017-10-29 DIAGNOSIS — E785 Hyperlipidemia, unspecified: Secondary | ICD-10-CM | POA: Diagnosis not present

## 2017-10-29 DIAGNOSIS — F0390 Unspecified dementia without behavioral disturbance: Secondary | ICD-10-CM | POA: Diagnosis not present

## 2017-10-29 DIAGNOSIS — I4891 Unspecified atrial fibrillation: Secondary | ICD-10-CM | POA: Diagnosis not present

## 2017-10-30 DIAGNOSIS — M150 Primary generalized (osteo)arthritis: Secondary | ICD-10-CM | POA: Diagnosis not present

## 2017-10-30 DIAGNOSIS — E1151 Type 2 diabetes mellitus with diabetic peripheral angiopathy without gangrene: Secondary | ICD-10-CM | POA: Diagnosis not present

## 2017-10-30 DIAGNOSIS — J4530 Mild persistent asthma, uncomplicated: Secondary | ICD-10-CM | POA: Diagnosis not present

## 2017-10-30 DIAGNOSIS — N390 Urinary tract infection, site not specified: Secondary | ICD-10-CM | POA: Diagnosis not present

## 2017-10-30 DIAGNOSIS — I4891 Unspecified atrial fibrillation: Secondary | ICD-10-CM | POA: Diagnosis not present

## 2017-10-30 DIAGNOSIS — I10 Essential (primary) hypertension: Secondary | ICD-10-CM | POA: Diagnosis not present

## 2017-10-30 DIAGNOSIS — E1140 Type 2 diabetes mellitus with diabetic neuropathy, unspecified: Secondary | ICD-10-CM | POA: Diagnosis not present

## 2017-10-30 DIAGNOSIS — E785 Hyperlipidemia, unspecified: Secondary | ICD-10-CM | POA: Diagnosis not present

## 2017-10-30 DIAGNOSIS — F0390 Unspecified dementia without behavioral disturbance: Secondary | ICD-10-CM | POA: Diagnosis not present

## 2017-10-31 DIAGNOSIS — M150 Primary generalized (osteo)arthritis: Secondary | ICD-10-CM | POA: Diagnosis not present

## 2017-10-31 DIAGNOSIS — E785 Hyperlipidemia, unspecified: Secondary | ICD-10-CM | POA: Diagnosis not present

## 2017-10-31 DIAGNOSIS — I4891 Unspecified atrial fibrillation: Secondary | ICD-10-CM | POA: Diagnosis not present

## 2017-10-31 DIAGNOSIS — J4530 Mild persistent asthma, uncomplicated: Secondary | ICD-10-CM | POA: Diagnosis not present

## 2017-10-31 DIAGNOSIS — F0390 Unspecified dementia without behavioral disturbance: Secondary | ICD-10-CM | POA: Diagnosis not present

## 2017-10-31 DIAGNOSIS — N390 Urinary tract infection, site not specified: Secondary | ICD-10-CM | POA: Diagnosis not present

## 2017-10-31 DIAGNOSIS — E1151 Type 2 diabetes mellitus with diabetic peripheral angiopathy without gangrene: Secondary | ICD-10-CM | POA: Diagnosis not present

## 2017-10-31 DIAGNOSIS — E1140 Type 2 diabetes mellitus with diabetic neuropathy, unspecified: Secondary | ICD-10-CM | POA: Diagnosis not present

## 2017-10-31 DIAGNOSIS — I10 Essential (primary) hypertension: Secondary | ICD-10-CM | POA: Diagnosis not present

## 2017-11-03 DIAGNOSIS — F0390 Unspecified dementia without behavioral disturbance: Secondary | ICD-10-CM | POA: Diagnosis not present

## 2017-11-03 DIAGNOSIS — N390 Urinary tract infection, site not specified: Secondary | ICD-10-CM | POA: Diagnosis not present

## 2017-11-03 DIAGNOSIS — J4530 Mild persistent asthma, uncomplicated: Secondary | ICD-10-CM | POA: Diagnosis not present

## 2017-11-03 DIAGNOSIS — E1140 Type 2 diabetes mellitus with diabetic neuropathy, unspecified: Secondary | ICD-10-CM | POA: Diagnosis not present

## 2017-11-03 DIAGNOSIS — I4891 Unspecified atrial fibrillation: Secondary | ICD-10-CM | POA: Diagnosis not present

## 2017-11-03 DIAGNOSIS — M150 Primary generalized (osteo)arthritis: Secondary | ICD-10-CM | POA: Diagnosis not present

## 2017-11-03 DIAGNOSIS — E1151 Type 2 diabetes mellitus with diabetic peripheral angiopathy without gangrene: Secondary | ICD-10-CM | POA: Diagnosis not present

## 2017-11-03 DIAGNOSIS — I10 Essential (primary) hypertension: Secondary | ICD-10-CM | POA: Diagnosis not present

## 2017-11-03 DIAGNOSIS — E785 Hyperlipidemia, unspecified: Secondary | ICD-10-CM | POA: Diagnosis not present

## 2017-11-05 DIAGNOSIS — E1151 Type 2 diabetes mellitus with diabetic peripheral angiopathy without gangrene: Secondary | ICD-10-CM | POA: Diagnosis not present

## 2017-11-05 DIAGNOSIS — N390 Urinary tract infection, site not specified: Secondary | ICD-10-CM | POA: Diagnosis not present

## 2017-11-05 DIAGNOSIS — I4891 Unspecified atrial fibrillation: Secondary | ICD-10-CM | POA: Diagnosis not present

## 2017-11-05 DIAGNOSIS — M150 Primary generalized (osteo)arthritis: Secondary | ICD-10-CM | POA: Diagnosis not present

## 2017-11-05 DIAGNOSIS — I10 Essential (primary) hypertension: Secondary | ICD-10-CM | POA: Diagnosis not present

## 2017-11-05 DIAGNOSIS — E1140 Type 2 diabetes mellitus with diabetic neuropathy, unspecified: Secondary | ICD-10-CM | POA: Diagnosis not present

## 2017-11-05 DIAGNOSIS — J4530 Mild persistent asthma, uncomplicated: Secondary | ICD-10-CM | POA: Diagnosis not present

## 2017-11-05 DIAGNOSIS — F0390 Unspecified dementia without behavioral disturbance: Secondary | ICD-10-CM | POA: Diagnosis not present

## 2017-11-05 DIAGNOSIS — E785 Hyperlipidemia, unspecified: Secondary | ICD-10-CM | POA: Diagnosis not present

## 2017-11-11 DIAGNOSIS — M150 Primary generalized (osteo)arthritis: Secondary | ICD-10-CM | POA: Diagnosis not present

## 2017-11-11 DIAGNOSIS — N390 Urinary tract infection, site not specified: Secondary | ICD-10-CM | POA: Diagnosis not present

## 2017-11-11 DIAGNOSIS — E1151 Type 2 diabetes mellitus with diabetic peripheral angiopathy without gangrene: Secondary | ICD-10-CM | POA: Diagnosis not present

## 2017-11-11 DIAGNOSIS — E1140 Type 2 diabetes mellitus with diabetic neuropathy, unspecified: Secondary | ICD-10-CM | POA: Diagnosis not present

## 2017-11-11 DIAGNOSIS — I4891 Unspecified atrial fibrillation: Secondary | ICD-10-CM | POA: Diagnosis not present

## 2017-11-11 DIAGNOSIS — E785 Hyperlipidemia, unspecified: Secondary | ICD-10-CM | POA: Diagnosis not present

## 2017-11-11 DIAGNOSIS — I10 Essential (primary) hypertension: Secondary | ICD-10-CM | POA: Diagnosis not present

## 2017-11-11 DIAGNOSIS — F0390 Unspecified dementia without behavioral disturbance: Secondary | ICD-10-CM | POA: Diagnosis not present

## 2017-11-11 DIAGNOSIS — J4530 Mild persistent asthma, uncomplicated: Secondary | ICD-10-CM | POA: Diagnosis not present

## 2017-11-12 DIAGNOSIS — E785 Hyperlipidemia, unspecified: Secondary | ICD-10-CM | POA: Diagnosis not present

## 2017-11-12 DIAGNOSIS — M150 Primary generalized (osteo)arthritis: Secondary | ICD-10-CM | POA: Diagnosis not present

## 2017-11-12 DIAGNOSIS — E1140 Type 2 diabetes mellitus with diabetic neuropathy, unspecified: Secondary | ICD-10-CM | POA: Diagnosis not present

## 2017-11-12 DIAGNOSIS — N390 Urinary tract infection, site not specified: Secondary | ICD-10-CM | POA: Diagnosis not present

## 2017-11-12 DIAGNOSIS — I10 Essential (primary) hypertension: Secondary | ICD-10-CM | POA: Diagnosis not present

## 2017-11-12 DIAGNOSIS — F0390 Unspecified dementia without behavioral disturbance: Secondary | ICD-10-CM | POA: Diagnosis not present

## 2017-11-12 DIAGNOSIS — J4530 Mild persistent asthma, uncomplicated: Secondary | ICD-10-CM | POA: Diagnosis not present

## 2017-11-12 DIAGNOSIS — I4891 Unspecified atrial fibrillation: Secondary | ICD-10-CM | POA: Diagnosis not present

## 2017-11-12 DIAGNOSIS — E1151 Type 2 diabetes mellitus with diabetic peripheral angiopathy without gangrene: Secondary | ICD-10-CM | POA: Diagnosis not present

## 2017-11-13 ENCOUNTER — Encounter: Payer: Self-pay | Admitting: Cardiology

## 2017-11-13 DIAGNOSIS — I480 Paroxysmal atrial fibrillation: Secondary | ICD-10-CM

## 2017-11-13 DIAGNOSIS — I4891 Unspecified atrial fibrillation: Secondary | ICD-10-CM | POA: Diagnosis not present

## 2017-11-13 DIAGNOSIS — I10 Essential (primary) hypertension: Secondary | ICD-10-CM | POA: Insufficient documentation

## 2017-11-13 DIAGNOSIS — F0390 Unspecified dementia without behavioral disturbance: Secondary | ICD-10-CM | POA: Diagnosis not present

## 2017-11-13 DIAGNOSIS — E119 Type 2 diabetes mellitus without complications: Secondary | ICD-10-CM | POA: Insufficient documentation

## 2017-11-13 DIAGNOSIS — M150 Primary generalized (osteo)arthritis: Secondary | ICD-10-CM | POA: Diagnosis not present

## 2017-11-13 DIAGNOSIS — N390 Urinary tract infection, site not specified: Secondary | ICD-10-CM | POA: Diagnosis not present

## 2017-11-13 DIAGNOSIS — E1140 Type 2 diabetes mellitus with diabetic neuropathy, unspecified: Secondary | ICD-10-CM | POA: Diagnosis not present

## 2017-11-13 DIAGNOSIS — J4530 Mild persistent asthma, uncomplicated: Secondary | ICD-10-CM | POA: Diagnosis not present

## 2017-11-13 DIAGNOSIS — E1151 Type 2 diabetes mellitus with diabetic peripheral angiopathy without gangrene: Secondary | ICD-10-CM | POA: Diagnosis not present

## 2017-11-13 DIAGNOSIS — E785 Hyperlipidemia, unspecified: Secondary | ICD-10-CM | POA: Diagnosis not present

## 2017-11-13 HISTORY — DX: Essential (primary) hypertension: I10

## 2017-11-13 HISTORY — DX: Paroxysmal atrial fibrillation: I48.0

## 2017-11-17 ENCOUNTER — Encounter: Payer: Self-pay | Admitting: Cardiology

## 2017-11-17 ENCOUNTER — Ambulatory Visit: Payer: Medicare HMO | Admitting: Cardiology

## 2017-11-17 VITALS — BP 152/68 | HR 58 | Ht 64.0 in | Wt 126.0 lb

## 2017-11-17 DIAGNOSIS — I10 Essential (primary) hypertension: Secondary | ICD-10-CM | POA: Diagnosis not present

## 2017-11-17 DIAGNOSIS — I480 Paroxysmal atrial fibrillation: Secondary | ICD-10-CM | POA: Diagnosis not present

## 2017-11-17 DIAGNOSIS — I4891 Unspecified atrial fibrillation: Secondary | ICD-10-CM | POA: Diagnosis not present

## 2017-11-17 DIAGNOSIS — E1151 Type 2 diabetes mellitus with diabetic peripheral angiopathy without gangrene: Secondary | ICD-10-CM | POA: Diagnosis not present

## 2017-11-17 DIAGNOSIS — F0390 Unspecified dementia without behavioral disturbance: Secondary | ICD-10-CM | POA: Diagnosis not present

## 2017-11-17 DIAGNOSIS — J4530 Mild persistent asthma, uncomplicated: Secondary | ICD-10-CM | POA: Diagnosis not present

## 2017-11-17 DIAGNOSIS — E1369 Other specified diabetes mellitus with other specified complication: Secondary | ICD-10-CM | POA: Diagnosis not present

## 2017-11-17 DIAGNOSIS — E785 Hyperlipidemia, unspecified: Secondary | ICD-10-CM | POA: Diagnosis not present

## 2017-11-17 DIAGNOSIS — D649 Anemia, unspecified: Secondary | ICD-10-CM | POA: Diagnosis not present

## 2017-11-17 DIAGNOSIS — N390 Urinary tract infection, site not specified: Secondary | ICD-10-CM | POA: Diagnosis not present

## 2017-11-17 DIAGNOSIS — M150 Primary generalized (osteo)arthritis: Secondary | ICD-10-CM | POA: Diagnosis not present

## 2017-11-17 DIAGNOSIS — E1140 Type 2 diabetes mellitus with diabetic neuropathy, unspecified: Secondary | ICD-10-CM | POA: Diagnosis not present

## 2017-11-17 MED ORDER — RIVAROXABAN 20 MG PO TABS: 20.0000 mg | ORAL_TABLET | Freq: Every day | ORAL | 2 refills | Status: DC

## 2017-11-17 NOTE — Addendum Note (Signed)
Addended by: Craige Cotta on: 11/17/2017 09:12 AM   Modules accepted: Orders

## 2017-11-17 NOTE — Addendum Note (Signed)
Addended by: Carren Rang on: 11/17/2017 10:17 AM   Modules accepted: Orders

## 2017-11-17 NOTE — Progress Notes (Signed)
Cardiology Office Note:    Date:  11/17/2017   ID:  Elaine Mathews, DOB May 01, 1933, MRN [id number]  PCP:  Lucianne Lei, MD  Cardiologist:  Garwin Brothers, MD   Referring MD: Lucianne Lei, MD    ASSESSMENT:    1. Paroxysmal atrial fibrillation (HCC)   2. Essential hypertension   3. Other specified diabetes mellitus with other specified complication, unspecified whether long term insulin use (HCC)    PLAN:    In order of problems listed above:  1. Primary prevention stressed with the patient.  Importance of compliance with diet and medication stressed and she vocalized understanding.  Her blood pressure is stable.  Diet was discussed for dyslipidemia and diabetes mellitus.  These issues are managed by her primary care physician.  I think she is not on statin therapy because of elevated LFTs. 2. Echocardiogram was unremarkable.  Her renal function is unremarkable and therefore we will increase her Xarelto to 20 mg daily.  He is stable on her gait and she was encouraged to walk on a regular basis. 3. Patient will be seen in follow-up appointment in 6 months or earlier if the patient has any concerns    Medication Adjustments/Labs and Tests Ordered: Current medicines are reviewed at length with the patient today.  Concerns regarding medicines are outlined above.  No orders of the defined types were placed in this encounter.  No orders of the defined types were placed in this encounter.    History of Present Illness:    Elaine Mathews is a 84 y.o. female who is being seen today for the evaluation of paroxysmal atrial fibrillation, essential hypertension At the request of Lucianne Lei, MD.  Patient is a pleasant 84 year old female.  She is accompanied by her son who is very supportive.  The patient has history of essential hypertension, dyslipidemia and diabetes mellitus.  She was recently admitted to the hospital.  She was diagnosed to have a stroke and paroxysmal atrial fibrillation or  other atrial fibrillation of new diagnosis.  Subsequently she was discharged from the hospital and has done fine.  She is on anticoagulation.  She takes Xarelto 15 mg daily.  No chest pain orthopnea or PND.  She is ambulatory and age-appropriate ambulation I feel is what she is involved with on a daily basis.  She has a very supportive son.  At the time of my evaluation, the patient is alert awake oriented and in no distress.  Past Medical History:  Diagnosis Date  . Allergic rhinosinusitis   . Arthritis   . Cataract, bilateral   . Diabetes (HCC)   . Diabetic nephropathy (HCC)   . Elevated liver enzymes   . Essential hypertension   . Fatty liver   . Foot lesion   . Full dentures   . Hyperlipidemia   . Hypothyroidism   . Leg pain, bilateral   . Mild asthma   . Mild asthma   . PVD (peripheral vascular disease) (HCC)   . Vitamin D deficiency     Past Surgical History:  Procedure Laterality Date  . APPENDECTOMY    . CATARACT EXTRACTION    . CHOLECYSTECTOMY    . TONSILLECTOMY      Current Medications: Current Meds  Medication Sig  . colesevelam (WELCHOL) 625 MG tablet Take 1 tablet by mouth 2 (two) times daily with a meal.  . diltiazem (CARDIZEM CD) 180 MG 24 hr capsule Take 180 mg by mouth daily.  . ergocalciferol (VITAMIN  D2) 50000 units capsule Take 50,000 Units by mouth once a week.  . ezetimibe (ZETIA) 10 MG tablet Take 10 mg by mouth daily.  . furosemide (LASIX) 20 MG tablet Take 20 mg by mouth as needed.   . Glucosamine-Chondroit-Vit C-Mn (GLUCOSAMINE 1500 COMPLEX PO) Take 1 capsule by mouth 3 (three) times daily.  Marland Kitchen glucose blood test strip 1 each by Other route as needed. Use as instructed  . levothyroxine (SYNTHROID, LEVOTHROID) 75 MCG tablet Take 75 mcg by mouth daily before breakfast.  . lisinopril (PRINIVIL,ZESTRIL) 40 MG tablet Take 40 mg by mouth daily.  . memantine (NAMENDA) 10 MG tablet Take 10 mg by mouth 2 (two) times daily.  . metoprolol succinate  (TOPROL-XL) 50 MG 24 hr tablet Take 50 mg by mouth daily. Take with or immediately following a meal.  . montelukast (SINGULAIR) 10 MG tablet Take 10 mg by mouth at bedtime.  . nitroGLYCERIN (NITROSTAT) 0.4 MG SL tablet Place 0.4 mg under the tongue every 5 (five) minutes as needed.  . Rivaroxaban (XARELTO) 15 MG TABS tablet Take 15 mg by mouth 2 (two) times daily with a meal.  . sertraline (ZOLOFT) 50 MG tablet Take 50 mg by mouth daily.     Allergies:   Codeine and Penicillins   Social History   Socioeconomic History  . Marital status: Divorced    Spouse name: Not on file  . Number of children: Not on file  . Years of education: Not on file  . Highest education level: Not on file  Occupational History  . Not on file  Social Needs  . Financial resource strain: Not on file  . Food insecurity:    Worry: Not on file    Inability: Not on file  . Transportation needs:    Medical: Not on file    Non-medical: Not on file  Tobacco Use  . Smoking status: Former Games developer  . Smokeless tobacco: Never Used  Substance and Sexual Activity  . Alcohol use: No  . Drug use: No  . Sexual activity: Not on file  Lifestyle  . Physical activity:    Days per week: Not on file    Minutes per session: Not on file  . Stress: Not on file  Relationships  . Social connections:    Talks on phone: Not on file    Gets together: Not on file    Attends religious service: Not on file    Active member of club or organization: Not on file    Attends meetings of clubs or organizations: Not on file    Relationship status: Not on file  Other Topics Concern  . Not on file  Social History Narrative  . Not on file     Family History: The patient's family history includes COPD in her son; Heart attack in her mother and son; Hypercholesterolemia in her mother; Hypertension in her father and mother; Stroke in her father and mother.  ROS:   Please see the history of present illness.    All other systems  reviewed and are negative.  EKGs/Labs/Other Studies Reviewed:    The following studies were reviewed today: I reviewed [facility] hospital records extensively.  EKG reveals sinus rhythm and nonspecific ST-T changes.  Primary care doctor's records were also reviewed and that her renal function was unremarkable.   Recent Labs: No results found for requested labs within last 8760 hours.  Recent Lipid Panel No results found for: CHOL, TRIG, HDL, CHOLHDL, VLDL, LDLCALC, LDLDIRECT  Physical Exam:    VS:  BP (!) 152/68 (BP Location: Right Arm, Patient Position: Sitting, Cuff Size: Normal)   Pulse (!) 58   Ht 5\' 4"  (1.626 m)   Wt 126 lb (57.2 kg)   SpO2 96%   BMI 21.63 kg/m     Wt Readings from Last 3 Encounters:  11/17/17 126 lb (57.2 kg)     GEN: Patient is in no acute distress HEENT: Normal NECK: No JVD; No carotid bruits LYMPHATICS: No lymphadenopathy CARDIAC: S1 S2 regular, 2/6 systolic murmur at the apex. RESPIRATORY:  Clear to auscultation without rales, wheezing or rhonchi  ABDOMEN: Soft, non-tender, non-distended MUSCULOSKELETAL:  No edema; No deformity  SKIN: Warm and dry NEUROLOGIC:  Alert and oriented x 3 PSYCHIATRIC:  Normal affect    Signed, Garwin Brothers, MD  11/17/2017 8:55 AM     [facility]

## 2017-11-17 NOTE — Patient Instructions (Signed)
Medication Instructions:  Your physician has recommended you make the following change in your medication:   CHANGE Xarelto to 20 mg daily with supper  If you need a refill on your cardiac medications before your next appointment, please call your pharmacy.   Lab work: Your physician recommends that you have the following labs drawn: ifob today   If you have labs (blood work) drawn today and your tests are completely normal, you will receive your results only by: Marland Kitchen MyChart Message (if you have MyChart) OR . A paper copy in the mail If you have any lab test that is abnormal or we need to change your treatment, we will call you to review the results.  Testing/Procedures: None  Follow-Up: At [facility], you and your health needs are our priority.  As part of our continuing mission to provide you with exceptional heart care, we have created designated Provider Care Teams.  These Care Teams include your primary Cardiologist (physician) and Advanced Practice Providers (APPs -  Physician Assistants and Nurse Practitioners) who all work together to provide you with the care you need, when you need it.   You will need a follow up appointment in 6 months.  Please call our office 2 months in advance to schedule this appointment.  You may see another member of our [employer] Provider Team in [place]: Gypsy Balsam, MD . Norman Herrlich, MD  Any Other Special Instructions Will Be Listed Below (If Applicable).   [facility]  Garey Ham, RN, BSN Rivaroxaban oral tablets What is this medicine? RIVAROXABAN (ri va ROX a ban) is an anticoagulant (blood thinner). It is used to treat blood clots in the lungs or in the veins. It is also used after knee or hip surgeries to prevent blood clots. It is also used to lower the chance of stroke in people with a medical condition called atrial fibrillation. This medicine may be used for other purposes; ask your health care provider or pharmacist if  you have questions. COMMON BRAND NAME(S): Xarelto, Xarelto Starter Pack What should I tell my health care provider before I take this medicine? They need to know if you have any of these conditions: -bleeding disorders -bleeding in the brain -blood in your stools (black or tarry stools) or if you have blood in your vomit -history of stomach bleeding -kidney disease -liver disease -low blood counts, like low white cell, platelet, or red cell counts -recent or planned spinal or epidural procedure -take medicines that treat or prevent blood clots -an unusual or allergic reaction to rivaroxaban, other medicines, foods, dyes, or preservatives -pregnant or trying to get pregnant -breast-feeding How should I use this medicine? Take this medicine by mouth with a glass of water. Follow the directions on the prescription label. Take your medicine at regular intervals. Do not take it more often than directed. Do not stop taking except on your doctor's advice. Stopping this medicine may increase your risk of a blood clot. Be sure to refill your prescription before you run out of medicine. If you are taking this medicine after hip or knee replacement surgery, take it with or without food. If you are taking this medicine for atrial fibrillation, take it with your evening meal. If you are taking this medicine to treat blood clots, take it with food at the same time each day. If you are unable to swallow your tablet, you may crush the tablet and mix it in applesauce. Then, immediately eat the applesauce. You  should eat more food right after you eat the applesauce containing the crushed tablet. Talk to your pediatrician regarding the use of this medicine in children. Special care may be needed. Overdosage: If you think you have taken too much of this medicine contact a poison control center or emergency room at once. NOTE: This medicine is only for you. Do not share this medicine with others. What if I miss a  dose? If you take your medicine once a day and miss a dose, take the missed dose as soon as you remember. If you take your medicine twice a day and miss a dose, take the missed dose immediately. In this instance, 2 tablets may be taken at the same time. The next day you should take 1 tablet twice a day as directed. What may interact with this medicine? Do not take this medicine with any of the following medications: -defibrotide This medicine may also interact with the following medications: -aspirin and aspirin-like medicines -certain antibiotics like erythromycin, azithromycin, and clarithromycin -certain medicines for fungal infections like ketoconazole and itraconazole -certain medicines for irregular heart beat like amiodarone, quinidine, dronedarone -certain medicines for seizures like carbamazepine, phenytoin -certain medicines that treat or prevent blood clots like warfarin, enoxaparin, and dalteparin -conivaptan -diltiazem -felodipine -indinavir -lopinavir; ritonavir -NSAIDS, medicines for pain and inflammation, like ibuprofen or naproxen -ranolazine -rifampin -ritonavir -SNRIs, medicines for depression, like desvenlafaxine, duloxetine, levomilnacipran, venlafaxine -SSRIs, medicines for depression, like citalopram, escitalopram, fluoxetine, fluvoxamine, paroxetine, sertraline -St. John's wort -verapamil This list may not describe all possible interactions. Give your health care provider a list of all the medicines, herbs, non-prescription drugs, or dietary supplements you use. Also tell them if you smoke, drink alcohol, or use illegal drugs. Some items may interact with your medicine. What should I watch for while using this medicine? Visit your doctor or health care professional for regular checks on your progress. Notify your doctor or health care professional and seek emergency treatment if you develop breathing problems; changes in vision; chest pain; severe, sudden headache;  pain, swelling, warmth in the leg; trouble speaking; sudden numbness or weakness of the face, arm or leg. These can be signs that your condition has gotten worse. If you are going to have surgery or other procedure, tell your doctor that you are taking this medicine. What side effects may I notice from receiving this medicine? Side effects that you should report to your doctor or health care professional as soon as possible: -allergic reactions like skin rash, itching or hives, swelling of the face, lips, or tongue -back pain -redness, blistering, peeling or loosening of the skin, including inside the mouth -signs and symptoms of bleeding such as bloody or black, tarry stools; red or dark-brown urine; spitting up blood or brown material that looks like coffee grounds; red spots on the skin; unusual bruising or bleeding from the eye, gums, or nose Side effects that usually do not require medical attention (report to your doctor or health care professional if they continue or are bothersome): -dizziness -muscle pain This list may not describe all possible side effects. Call your doctor for medical advice about side effects. You may report side effects to FDA at 1-800-FDA-1088. Where should I keep my medicine? Keep out of the reach of children. Store at room temperature between 15 and 30 degrees C (59 and 86 degrees F). Throw away any unused medicine after the expiration date. NOTE: This sheet is a summary. It may not cover all possible information. If you  have questions about this medicine, talk to your doctor, pharmacist, or health care provider.  2018 Elsevier/Gold Standard (2015-10-18 16:29:33)

## 2017-11-18 DIAGNOSIS — E785 Hyperlipidemia, unspecified: Secondary | ICD-10-CM | POA: Diagnosis not present

## 2017-11-18 DIAGNOSIS — F0390 Unspecified dementia without behavioral disturbance: Secondary | ICD-10-CM | POA: Diagnosis not present

## 2017-11-18 DIAGNOSIS — D649 Anemia, unspecified: Secondary | ICD-10-CM | POA: Diagnosis not present

## 2017-11-18 DIAGNOSIS — E1151 Type 2 diabetes mellitus with diabetic peripheral angiopathy without gangrene: Secondary | ICD-10-CM | POA: Diagnosis not present

## 2017-11-18 DIAGNOSIS — I10 Essential (primary) hypertension: Secondary | ICD-10-CM | POA: Diagnosis not present

## 2017-11-18 DIAGNOSIS — I4891 Unspecified atrial fibrillation: Secondary | ICD-10-CM | POA: Diagnosis not present

## 2017-11-18 DIAGNOSIS — N390 Urinary tract infection, site not specified: Secondary | ICD-10-CM | POA: Diagnosis not present

## 2017-11-18 DIAGNOSIS — E1140 Type 2 diabetes mellitus with diabetic neuropathy, unspecified: Secondary | ICD-10-CM | POA: Diagnosis not present

## 2017-11-18 DIAGNOSIS — J4530 Mild persistent asthma, uncomplicated: Secondary | ICD-10-CM | POA: Diagnosis not present

## 2017-11-18 DIAGNOSIS — M150 Primary generalized (osteo)arthritis: Secondary | ICD-10-CM | POA: Diagnosis not present

## 2017-11-25 ENCOUNTER — Ambulatory Visit: Payer: Medicare HMO | Admitting: Podiatry

## 2017-11-25 ENCOUNTER — Encounter: Payer: Self-pay | Admitting: Podiatry

## 2017-11-25 VITALS — BP 164/70 | HR 61 | Resp 16 | Ht 64.0 in | Wt 125.0 lb

## 2017-11-25 DIAGNOSIS — E119 Type 2 diabetes mellitus without complications: Secondary | ICD-10-CM

## 2017-11-25 DIAGNOSIS — I739 Peripheral vascular disease, unspecified: Secondary | ICD-10-CM

## 2017-11-25 DIAGNOSIS — E1151 Type 2 diabetes mellitus with diabetic peripheral angiopathy without gangrene: Secondary | ICD-10-CM

## 2017-11-25 DIAGNOSIS — B351 Tinea unguium: Secondary | ICD-10-CM | POA: Diagnosis not present

## 2017-11-25 LAB — FECAL OCCULT BLOOD, IMMUNOCHEMICAL: Fecal Occult Bld: POSITIVE — AB

## 2017-11-25 NOTE — Progress Notes (Signed)
Subjective:  Patient ID: Elaine Mathews, female    DOB: June 01, 1933,  MRN: [id number]  Chief Complaint  Patient presents with  . debride    BL nail trimming -FBS: 123 A1C: "idk" PCP: [facility] x few days    84 y.o. female presents  for diabetic foot care. Last AMBS was 123. Reports numbness and tingling in their feet. Denies cramping in legs and thighs.  Review of Systems: Negative except as noted in the HPI. Denies N/V/F/Ch.  Past Medical History:  Diagnosis Date  . Allergic rhinosinusitis   . Arthritis   . Cataract, bilateral   . Diabetes (HCC)   . Diabetic nephropathy (HCC)   . Elevated liver enzymes   . Essential hypertension   . Fatty liver   . Foot lesion   . Full dentures   . Hyperlipidemia   . Hypothyroidism   . Leg pain, bilateral   . Mild asthma   . Mild asthma   . PVD (peripheral vascular disease) (HCC)   . Vitamin D deficiency     Current Outpatient Medications:  .  colesevelam (WELCHOL) 625 MG tablet, Take 1 tablet by mouth 2 (two) times daily with a meal., Disp: , Rfl:  .  diltiazem (CARDIZEM CD) 180 MG 24 hr capsule, Take 180 mg by mouth daily., Disp: , Rfl:  .  ergocalciferol (VITAMIN D2) 50000 units capsule, Take 50,000 Units by mouth once a week., Disp: , Rfl:  .  ezetimibe (ZETIA) 10 MG tablet, Take 10 mg by mouth daily., Disp: , Rfl:  .  furosemide (LASIX) 20 MG tablet, Take 20 mg by mouth as needed. , Disp: , Rfl:  .  Glucosamine-Chondroit-Vit C-Mn (GLUCOSAMINE 1500 COMPLEX PO), Take 1 capsule by mouth 3 (three) times daily., Disp: , Rfl:  .  glucose blood test strip, 1 each by Other route as needed. Use as instructed, Disp: , Rfl:  .  levothyroxine (SYNTHROID, LEVOTHROID) 75 MCG tablet, Take 75 mcg by mouth daily before breakfast., Disp: , Rfl:  .  lisinopril (PRINIVIL,ZESTRIL) 40 MG tablet, Take 40 mg by mouth daily., Disp: , Rfl:  .  memantine (NAMENDA) 10 MG tablet, Take 10 mg by mouth 2 (two) times daily., Disp: , Rfl:  .  metoprolol succinate  (TOPROL-XL) 50 MG 24 hr tablet, Take 50 mg by mouth daily. Take with or immediately following a meal., Disp: , Rfl:  .  montelukast (SINGULAIR) 10 MG tablet, Take 10 mg by mouth at bedtime., Disp: , Rfl:  .  nitroGLYCERIN (NITROSTAT) 0.4 MG SL tablet, Place 0.4 mg under the tongue every 5 (five) minutes as needed., Disp: , Rfl:  .  rivaroxaban (XARELTO) 20 MG TABS tablet, Take 1 tablet (20 mg total) by mouth daily with supper., Disp: 90 tablet, Rfl: 2 .  sertraline (ZOLOFT) 50 MG tablet, Take 50 mg by mouth daily., Disp: , Rfl:   Social History   Tobacco Use  Smoking Status Former Smoker  Smokeless Tobacco Never Used    Allergies  Allergen Reactions  . Codeine   . Penicillins    Objective:   Vitals:   11/25/17 1054  BP: (!) 164/70  Pulse: 61  Resp: 16   Body mass index is 21.46 kg/m. Constitutional Well developed. Well nourished.  Vascular Dorsalis pedis pulses barely palpable bilaterally  Posterior tibial pulses barely palpable bilaterally  Pedal hair growth absent. Capillary refill sluggish to all digits.  No cyanosis or clubbing noted.  Neurologic Normal speech. Oriented to person, place, and  time. Epicritic sensation to light touch grossly present bilaterally. Protective sensation with 5.07 monofilament  absent bilaterally. Vibratory sensation present bilaterally.  Dermatologic Nails elongated, thickened, dystrophic. No open wounds. No skin lesions.  Orthopedic: Normal joint ROM without pain or crepitus bilaterally. No visible deformities. No bony tenderness.   Assessment:   1. Comprehensive diabetic foot examination, type 2 DM, encounter for (HCC)   2. Diabetes mellitus type 2 with peripheral artery disease (HCC)   3. Onychomycosis   4. PAD (peripheral artery disease) (HCC)    Plan:  Patient was evaluated and treated and all questions answered.  Diabetes with PAD, Onychomycosis -Educated on diabetic footcare. Diabetic risk level 2 -Nails x10 debrided  sharply and manually with large nail nipper and rotary burr.  -Will order non-invasive vascular studies.  Procedure: Nail Debridement Rationale: Patient meets criteria for routine foot care due to PAD Type of Debridement: manual, sharp debridement. Instrumentation: Nail nipper, rotary burr. Number of Nails: 10   Return in about 3 months (around 02/25/2018) for Diabetic Foot Care.

## 2017-11-26 ENCOUNTER — Telehealth: Payer: Self-pay

## 2017-11-26 NOTE — Telephone Encounter (Signed)
Informed [facility] office of abnormal occult stool. Results have been faxed to the office. Will have covering MD address.

## 2017-11-27 ENCOUNTER — Telehealth: Payer: Self-pay | Admitting: *Deleted

## 2017-11-27 DIAGNOSIS — E1151 Type 2 diabetes mellitus with diabetic peripheral angiopathy without gangrene: Secondary | ICD-10-CM

## 2017-11-27 DIAGNOSIS — I739 Peripheral vascular disease, unspecified: Secondary | ICD-10-CM

## 2017-11-27 DIAGNOSIS — R195 Other fecal abnormalities: Secondary | ICD-10-CM | POA: Diagnosis not present

## 2017-11-27 DIAGNOSIS — Z6821 Body mass index (BMI) 21.0-21.9, adult: Secondary | ICD-10-CM | POA: Diagnosis not present

## 2017-11-27 DIAGNOSIS — I10 Essential (primary) hypertension: Secondary | ICD-10-CM | POA: Diagnosis not present

## 2017-11-27 NOTE — Telephone Encounter (Signed)
-----   Message from Park Liter, DPM sent at 11/25/2017 11:52 AM EDT ----- Can we order non-invasive arterial studies to be performed at [facility]?

## 2017-11-28 NOTE — Telephone Encounter (Signed)
[facility] scheduled arterial US 12/02/2017 arrive 2:30pm for 3:00pm. Faxed required form with orders to [facility]. I informed pt of the 1022/2019 testing appt.

## 2017-12-02 ENCOUNTER — Telehealth: Payer: Self-pay | Admitting: *Deleted

## 2017-12-02 NOTE — Telephone Encounter (Signed)
Pt's son, [name] states his mtr is the pt and she is having a problem with her feet, but when that got to the center pt did not have an appt. [name] states pt had written that Joya San had given her the appt. I reviewed my messages and I had informed pt of [facility] vascular study appt today at 2:30pm. I gave [facility] [phone] to reschedule.

## 2017-12-08 DIAGNOSIS — I739 Peripheral vascular disease, unspecified: Secondary | ICD-10-CM | POA: Diagnosis not present

## 2017-12-08 DIAGNOSIS — E1151 Type 2 diabetes mellitus with diabetic peripheral angiopathy without gangrene: Secondary | ICD-10-CM | POA: Diagnosis not present

## 2017-12-09 DIAGNOSIS — I10 Essential (primary) hypertension: Secondary | ICD-10-CM | POA: Diagnosis not present

## 2017-12-09 DIAGNOSIS — F0390 Unspecified dementia without behavioral disturbance: Secondary | ICD-10-CM | POA: Diagnosis not present

## 2017-12-09 DIAGNOSIS — E1140 Type 2 diabetes mellitus with diabetic neuropathy, unspecified: Secondary | ICD-10-CM | POA: Diagnosis not present

## 2017-12-09 DIAGNOSIS — I4891 Unspecified atrial fibrillation: Secondary | ICD-10-CM | POA: Diagnosis not present

## 2017-12-09 DIAGNOSIS — E1151 Type 2 diabetes mellitus with diabetic peripheral angiopathy without gangrene: Secondary | ICD-10-CM | POA: Diagnosis not present

## 2017-12-09 DIAGNOSIS — J4530 Mild persistent asthma, uncomplicated: Secondary | ICD-10-CM | POA: Diagnosis not present

## 2017-12-09 DIAGNOSIS — E785 Hyperlipidemia, unspecified: Secondary | ICD-10-CM | POA: Diagnosis not present

## 2017-12-09 DIAGNOSIS — N390 Urinary tract infection, site not specified: Secondary | ICD-10-CM | POA: Diagnosis not present

## 2017-12-09 DIAGNOSIS — M150 Primary generalized (osteo)arthritis: Secondary | ICD-10-CM | POA: Diagnosis not present

## 2017-12-10 ENCOUNTER — Encounter: Payer: Self-pay | Admitting: Podiatry

## 2017-12-16 DIAGNOSIS — M150 Primary generalized (osteo)arthritis: Secondary | ICD-10-CM | POA: Diagnosis not present

## 2017-12-16 DIAGNOSIS — J4530 Mild persistent asthma, uncomplicated: Secondary | ICD-10-CM | POA: Diagnosis not present

## 2017-12-16 DIAGNOSIS — E785 Hyperlipidemia, unspecified: Secondary | ICD-10-CM | POA: Diagnosis not present

## 2017-12-16 DIAGNOSIS — E1140 Type 2 diabetes mellitus with diabetic neuropathy, unspecified: Secondary | ICD-10-CM | POA: Diagnosis not present

## 2017-12-16 DIAGNOSIS — I10 Essential (primary) hypertension: Secondary | ICD-10-CM | POA: Diagnosis not present

## 2017-12-16 DIAGNOSIS — F0390 Unspecified dementia without behavioral disturbance: Secondary | ICD-10-CM | POA: Diagnosis not present

## 2017-12-16 DIAGNOSIS — E1151 Type 2 diabetes mellitus with diabetic peripheral angiopathy without gangrene: Secondary | ICD-10-CM | POA: Diagnosis not present

## 2017-12-16 DIAGNOSIS — I4891 Unspecified atrial fibrillation: Secondary | ICD-10-CM | POA: Diagnosis not present

## 2017-12-16 DIAGNOSIS — N390 Urinary tract infection, site not specified: Secondary | ICD-10-CM | POA: Diagnosis not present

## 2017-12-23 DIAGNOSIS — F0390 Unspecified dementia without behavioral disturbance: Secondary | ICD-10-CM | POA: Diagnosis not present

## 2017-12-23 DIAGNOSIS — M150 Primary generalized (osteo)arthritis: Secondary | ICD-10-CM | POA: Diagnosis not present

## 2017-12-23 DIAGNOSIS — E1140 Type 2 diabetes mellitus with diabetic neuropathy, unspecified: Secondary | ICD-10-CM | POA: Diagnosis not present

## 2017-12-23 DIAGNOSIS — E785 Hyperlipidemia, unspecified: Secondary | ICD-10-CM | POA: Diagnosis not present

## 2017-12-23 DIAGNOSIS — E1151 Type 2 diabetes mellitus with diabetic peripheral angiopathy without gangrene: Secondary | ICD-10-CM | POA: Diagnosis not present

## 2017-12-23 DIAGNOSIS — I4891 Unspecified atrial fibrillation: Secondary | ICD-10-CM | POA: Diagnosis not present

## 2017-12-23 DIAGNOSIS — I10 Essential (primary) hypertension: Secondary | ICD-10-CM | POA: Diagnosis not present

## 2017-12-23 DIAGNOSIS — J4530 Mild persistent asthma, uncomplicated: Secondary | ICD-10-CM | POA: Diagnosis not present

## 2017-12-23 DIAGNOSIS — N390 Urinary tract infection, site not specified: Secondary | ICD-10-CM | POA: Diagnosis not present

## 2017-12-24 DIAGNOSIS — E785 Hyperlipidemia, unspecified: Secondary | ICD-10-CM | POA: Diagnosis not present

## 2017-12-24 DIAGNOSIS — I10 Essential (primary) hypertension: Secondary | ICD-10-CM | POA: Diagnosis not present

## 2017-12-24 DIAGNOSIS — I480 Paroxysmal atrial fibrillation: Secondary | ICD-10-CM | POA: Diagnosis not present

## 2017-12-24 DIAGNOSIS — Z23 Encounter for immunization: Secondary | ICD-10-CM | POA: Diagnosis not present

## 2017-12-24 DIAGNOSIS — E039 Hypothyroidism, unspecified: Secondary | ICD-10-CM | POA: Diagnosis not present

## 2017-12-24 DIAGNOSIS — Z741 Need for assistance with personal care: Secondary | ICD-10-CM | POA: Diagnosis not present

## 2017-12-24 DIAGNOSIS — Z6821 Body mass index (BMI) 21.0-21.9, adult: Secondary | ICD-10-CM | POA: Diagnosis not present

## 2017-12-24 DIAGNOSIS — M1990 Unspecified osteoarthritis, unspecified site: Secondary | ICD-10-CM | POA: Diagnosis not present

## 2017-12-25 DIAGNOSIS — I10 Essential (primary) hypertension: Secondary | ICD-10-CM | POA: Diagnosis not present

## 2017-12-25 DIAGNOSIS — E785 Hyperlipidemia, unspecified: Secondary | ICD-10-CM | POA: Diagnosis not present

## 2017-12-25 DIAGNOSIS — M150 Primary generalized (osteo)arthritis: Secondary | ICD-10-CM | POA: Diagnosis not present

## 2017-12-25 DIAGNOSIS — E1151 Type 2 diabetes mellitus with diabetic peripheral angiopathy without gangrene: Secondary | ICD-10-CM | POA: Diagnosis not present

## 2017-12-25 DIAGNOSIS — N390 Urinary tract infection, site not specified: Secondary | ICD-10-CM | POA: Diagnosis not present

## 2017-12-25 DIAGNOSIS — J4530 Mild persistent asthma, uncomplicated: Secondary | ICD-10-CM | POA: Diagnosis not present

## 2017-12-25 DIAGNOSIS — F0390 Unspecified dementia without behavioral disturbance: Secondary | ICD-10-CM | POA: Diagnosis not present

## 2017-12-25 DIAGNOSIS — I4891 Unspecified atrial fibrillation: Secondary | ICD-10-CM | POA: Diagnosis not present

## 2017-12-25 DIAGNOSIS — E1140 Type 2 diabetes mellitus with diabetic neuropathy, unspecified: Secondary | ICD-10-CM | POA: Diagnosis not present

## 2017-12-29 DIAGNOSIS — Z4800 Encounter for change or removal of nonsurgical wound dressing: Secondary | ICD-10-CM | POA: Diagnosis not present

## 2017-12-30 DIAGNOSIS — I4891 Unspecified atrial fibrillation: Secondary | ICD-10-CM | POA: Diagnosis not present

## 2017-12-30 DIAGNOSIS — N390 Urinary tract infection, site not specified: Secondary | ICD-10-CM | POA: Diagnosis not present

## 2017-12-30 DIAGNOSIS — J4530 Mild persistent asthma, uncomplicated: Secondary | ICD-10-CM | POA: Diagnosis not present

## 2017-12-30 DIAGNOSIS — M150 Primary generalized (osteo)arthritis: Secondary | ICD-10-CM | POA: Diagnosis not present

## 2017-12-30 DIAGNOSIS — E785 Hyperlipidemia, unspecified: Secondary | ICD-10-CM | POA: Diagnosis not present

## 2017-12-30 DIAGNOSIS — F0390 Unspecified dementia without behavioral disturbance: Secondary | ICD-10-CM | POA: Diagnosis not present

## 2017-12-30 DIAGNOSIS — E1140 Type 2 diabetes mellitus with diabetic neuropathy, unspecified: Secondary | ICD-10-CM | POA: Diagnosis not present

## 2017-12-30 DIAGNOSIS — E1151 Type 2 diabetes mellitus with diabetic peripheral angiopathy without gangrene: Secondary | ICD-10-CM | POA: Diagnosis not present

## 2017-12-30 DIAGNOSIS — I10 Essential (primary) hypertension: Secondary | ICD-10-CM | POA: Diagnosis not present

## 2018-01-06 DIAGNOSIS — I4891 Unspecified atrial fibrillation: Secondary | ICD-10-CM | POA: Diagnosis not present

## 2018-01-06 DIAGNOSIS — E1140 Type 2 diabetes mellitus with diabetic neuropathy, unspecified: Secondary | ICD-10-CM | POA: Diagnosis not present

## 2018-01-06 DIAGNOSIS — F0390 Unspecified dementia without behavioral disturbance: Secondary | ICD-10-CM | POA: Diagnosis not present

## 2018-01-06 DIAGNOSIS — E1151 Type 2 diabetes mellitus with diabetic peripheral angiopathy without gangrene: Secondary | ICD-10-CM | POA: Diagnosis not present

## 2018-01-06 DIAGNOSIS — N390 Urinary tract infection, site not specified: Secondary | ICD-10-CM | POA: Diagnosis not present

## 2018-01-06 DIAGNOSIS — J4530 Mild persistent asthma, uncomplicated: Secondary | ICD-10-CM | POA: Diagnosis not present

## 2018-01-06 DIAGNOSIS — I10 Essential (primary) hypertension: Secondary | ICD-10-CM | POA: Diagnosis not present

## 2018-01-06 DIAGNOSIS — M150 Primary generalized (osteo)arthritis: Secondary | ICD-10-CM | POA: Diagnosis not present

## 2018-01-06 DIAGNOSIS — E785 Hyperlipidemia, unspecified: Secondary | ICD-10-CM | POA: Diagnosis not present

## 2018-01-13 DIAGNOSIS — E785 Hyperlipidemia, unspecified: Secondary | ICD-10-CM | POA: Diagnosis not present

## 2018-01-13 DIAGNOSIS — I4891 Unspecified atrial fibrillation: Secondary | ICD-10-CM | POA: Diagnosis not present

## 2018-01-13 DIAGNOSIS — J4530 Mild persistent asthma, uncomplicated: Secondary | ICD-10-CM | POA: Diagnosis not present

## 2018-01-13 DIAGNOSIS — M150 Primary generalized (osteo)arthritis: Secondary | ICD-10-CM | POA: Diagnosis not present

## 2018-01-13 DIAGNOSIS — E1140 Type 2 diabetes mellitus with diabetic neuropathy, unspecified: Secondary | ICD-10-CM | POA: Diagnosis not present

## 2018-01-13 DIAGNOSIS — E1151 Type 2 diabetes mellitus with diabetic peripheral angiopathy without gangrene: Secondary | ICD-10-CM | POA: Diagnosis not present

## 2018-01-13 DIAGNOSIS — N390 Urinary tract infection, site not specified: Secondary | ICD-10-CM | POA: Diagnosis not present

## 2018-01-13 DIAGNOSIS — I10 Essential (primary) hypertension: Secondary | ICD-10-CM | POA: Diagnosis not present

## 2018-01-13 DIAGNOSIS — F0390 Unspecified dementia without behavioral disturbance: Secondary | ICD-10-CM | POA: Diagnosis not present

## 2018-01-20 DIAGNOSIS — I10 Essential (primary) hypertension: Secondary | ICD-10-CM | POA: Diagnosis not present

## 2018-01-20 DIAGNOSIS — F0390 Unspecified dementia without behavioral disturbance: Secondary | ICD-10-CM | POA: Diagnosis not present

## 2018-01-20 DIAGNOSIS — E785 Hyperlipidemia, unspecified: Secondary | ICD-10-CM | POA: Diagnosis not present

## 2018-01-20 DIAGNOSIS — I4891 Unspecified atrial fibrillation: Secondary | ICD-10-CM | POA: Diagnosis not present

## 2018-01-20 DIAGNOSIS — J4530 Mild persistent asthma, uncomplicated: Secondary | ICD-10-CM | POA: Diagnosis not present

## 2018-01-20 DIAGNOSIS — N390 Urinary tract infection, site not specified: Secondary | ICD-10-CM | POA: Diagnosis not present

## 2018-01-20 DIAGNOSIS — E1140 Type 2 diabetes mellitus with diabetic neuropathy, unspecified: Secondary | ICD-10-CM | POA: Diagnosis not present

## 2018-01-20 DIAGNOSIS — M150 Primary generalized (osteo)arthritis: Secondary | ICD-10-CM | POA: Diagnosis not present

## 2018-01-20 DIAGNOSIS — E1151 Type 2 diabetes mellitus with diabetic peripheral angiopathy without gangrene: Secondary | ICD-10-CM | POA: Diagnosis not present

## 2018-01-27 DIAGNOSIS — Z09 Encounter for follow-up examination after completed treatment for conditions other than malignant neoplasm: Secondary | ICD-10-CM | POA: Diagnosis not present

## 2018-01-27 DIAGNOSIS — I10 Essential (primary) hypertension: Secondary | ICD-10-CM | POA: Diagnosis not present

## 2018-01-27 DIAGNOSIS — R748 Abnormal levels of other serum enzymes: Secondary | ICD-10-CM | POA: Diagnosis not present

## 2018-01-27 DIAGNOSIS — I480 Paroxysmal atrial fibrillation: Secondary | ICD-10-CM | POA: Diagnosis not present

## 2018-01-27 DIAGNOSIS — M1990 Unspecified osteoarthritis, unspecified site: Secondary | ICD-10-CM | POA: Diagnosis not present

## 2018-01-27 DIAGNOSIS — E039 Hypothyroidism, unspecified: Secondary | ICD-10-CM | POA: Diagnosis not present

## 2018-01-27 DIAGNOSIS — Z6821 Body mass index (BMI) 21.0-21.9, adult: Secondary | ICD-10-CM | POA: Diagnosis not present

## 2018-01-27 DIAGNOSIS — E782 Mixed hyperlipidemia: Secondary | ICD-10-CM | POA: Diagnosis not present

## 2018-01-28 DIAGNOSIS — J4530 Mild persistent asthma, uncomplicated: Secondary | ICD-10-CM | POA: Diagnosis not present

## 2018-01-28 DIAGNOSIS — E1140 Type 2 diabetes mellitus with diabetic neuropathy, unspecified: Secondary | ICD-10-CM | POA: Diagnosis not present

## 2018-01-28 DIAGNOSIS — E785 Hyperlipidemia, unspecified: Secondary | ICD-10-CM | POA: Diagnosis not present

## 2018-01-28 DIAGNOSIS — I10 Essential (primary) hypertension: Secondary | ICD-10-CM | POA: Diagnosis not present

## 2018-01-28 DIAGNOSIS — N390 Urinary tract infection, site not specified: Secondary | ICD-10-CM | POA: Diagnosis not present

## 2018-01-28 DIAGNOSIS — F0390 Unspecified dementia without behavioral disturbance: Secondary | ICD-10-CM | POA: Diagnosis not present

## 2018-01-28 DIAGNOSIS — I4891 Unspecified atrial fibrillation: Secondary | ICD-10-CM | POA: Diagnosis not present

## 2018-01-28 DIAGNOSIS — M150 Primary generalized (osteo)arthritis: Secondary | ICD-10-CM | POA: Diagnosis not present

## 2018-01-28 DIAGNOSIS — E1151 Type 2 diabetes mellitus with diabetic peripheral angiopathy without gangrene: Secondary | ICD-10-CM | POA: Diagnosis not present

## 2018-02-24 ENCOUNTER — Ambulatory Visit: Payer: Medicare HMO | Admitting: Podiatry

## 2018-03-11 DIAGNOSIS — R945 Abnormal results of liver function studies: Secondary | ICD-10-CM | POA: Diagnosis not present

## 2018-03-11 DIAGNOSIS — I480 Paroxysmal atrial fibrillation: Secondary | ICD-10-CM | POA: Diagnosis not present

## 2018-03-11 DIAGNOSIS — E782 Mixed hyperlipidemia: Secondary | ICD-10-CM | POA: Diagnosis not present

## 2018-03-11 DIAGNOSIS — M1990 Unspecified osteoarthritis, unspecified site: Secondary | ICD-10-CM | POA: Diagnosis not present

## 2018-03-11 DIAGNOSIS — Z6821 Body mass index (BMI) 21.0-21.9, adult: Secondary | ICD-10-CM | POA: Diagnosis not present

## 2018-03-11 DIAGNOSIS — M79604 Pain in right leg: Secondary | ICD-10-CM | POA: Diagnosis not present

## 2018-03-11 DIAGNOSIS — E039 Hypothyroidism, unspecified: Secondary | ICD-10-CM | POA: Diagnosis not present

## 2018-03-11 DIAGNOSIS — E1142 Type 2 diabetes mellitus with diabetic polyneuropathy: Secondary | ICD-10-CM | POA: Diagnosis not present

## 2018-03-11 DIAGNOSIS — L989 Disorder of the skin and subcutaneous tissue, unspecified: Secondary | ICD-10-CM | POA: Diagnosis not present

## 2018-03-11 DIAGNOSIS — K760 Fatty (change of) liver, not elsewhere classified: Secondary | ICD-10-CM | POA: Diagnosis not present

## 2018-03-11 DIAGNOSIS — I10 Essential (primary) hypertension: Secondary | ICD-10-CM | POA: Diagnosis not present

## 2018-04-01 ENCOUNTER — Telehealth: Payer: Self-pay | Admitting: *Deleted

## 2018-04-01 MED ORDER — RIVAROXABAN 20 MG PO TABS: 20.0000 mg | ORAL_TABLET | Freq: Every day | ORAL | 1 refills | Status: DC

## 2018-04-01 NOTE — Telephone Encounter (Signed)
*  STAT* If patient is at the pharmacy, call can be transferred to refill team.   1. Which medications need to be refilled? (please list name of each medication and dose if known) Xarelto 20 mg qd  2. Which pharmacy/location (including street and city if local pharmacy) is medication to be sent to?[facility]  3. Do they need a 30 day or 90 day supply? 90

## 2018-04-28 DIAGNOSIS — M1990 Unspecified osteoarthritis, unspecified site: Secondary | ICD-10-CM | POA: Diagnosis not present

## 2018-04-28 DIAGNOSIS — Z79899 Other long term (current) drug therapy: Secondary | ICD-10-CM | POA: Diagnosis not present

## 2018-04-28 DIAGNOSIS — E039 Hypothyroidism, unspecified: Secondary | ICD-10-CM | POA: Diagnosis not present

## 2018-04-28 DIAGNOSIS — E782 Mixed hyperlipidemia: Secondary | ICD-10-CM | POA: Diagnosis not present

## 2018-04-28 DIAGNOSIS — I10 Essential (primary) hypertension: Secondary | ICD-10-CM | POA: Diagnosis not present

## 2018-04-28 DIAGNOSIS — K760 Fatty (change of) liver, not elsewhere classified: Secondary | ICD-10-CM | POA: Diagnosis not present

## 2018-04-28 DIAGNOSIS — E1142 Type 2 diabetes mellitus with diabetic polyneuropathy: Secondary | ICD-10-CM | POA: Diagnosis not present

## 2018-04-30 ENCOUNTER — Telehealth: Payer: Self-pay | Admitting: Cardiology

## 2018-04-30 MED ORDER — RIVAROXABAN 20 MG PO TABS: 20.0000 mg | ORAL_TABLET | Freq: Every day | ORAL | 1 refills | Status: DC

## 2018-04-30 NOTE — Telephone Encounter (Signed)
Patient is requesting refills for her Xarelto be sent to [facility]

## 2018-04-30 NOTE — Telephone Encounter (Signed)
Refill for xarelto sent to [facility] per patient request.

## 2018-05-12 ENCOUNTER — Telehealth: Payer: Self-pay | Admitting: Cardiology

## 2018-05-12 ENCOUNTER — Telehealth: Payer: Self-pay

## 2018-05-12 NOTE — Telephone Encounter (Signed)
Virtual Visit Pre-Appointment Phone Call  Steps For Call:  1. Confirm consent - "In the setting of the current Covid19 crisis, you are scheduled for a (phone or video) visit with your provider on (date) at (time).  Just as we do with many in-office visits, in order for you to participate in this visit, we must obtain consent.  If you'd like, I can send this to your mychart (if signed up) or email for you to review.  Otherwise, I can obtain your verbal consent now.  All virtual visits are billed to your insurance company just like a normal visit would be.  By agreeing to a virtual visit, we'd like you to understand that the technology does not allow for your provider to perform an examination, and thus may limit your provider's ability to fully assess your condition.  Finally, though the technology is pretty good, we cannot assure that it will always work on either your or our end, and in the setting of a video visit, we may have to convert it to a phone-only visit.  In either situation, we cannot ensure that we have a secure connection.  Are you willing to proceed?"  2. Give patient instructions for WebEx download to smartphone as below if video visit  3. Advise patient to be prepared with any vital sign or heart rhythm information, their current medicines, and a piece of paper and pen handy for any instructions they may receive the day of their visit  4. Inform patient they will receive a phone call 15 minutes prior to their appointment time (may be from unknown caller ID) so they should be prepared to answer  5. Confirm that appointment type is correct in Epic appointment notes (video vs telephone)    TELEPHONE CALL NOTE  Elaine Mathews has been deemed a candidate for a follow-up tele-health visit to limit community exposure during the Covid-19 pandemic. I spoke with the patient via phone to ensure availability of phone/video source, confirm preferred email & phone number, and discuss  instructions and expectations.  I reminded Elaine Mathews to be prepared with any vital sign and/or heart rhythm information that could potentially be obtained via home monitoring, at the time of her visit. I reminded Elaine Mathews to expect a phone call at the time of her visit if her visit.  Did the patient verbally acknowledge consent to treatment? YES  Minette Brine 05/12/2018 2:51 PM   DOWNLOADING THE WEBEX SOFTWARE TO SMARTPHONE  - If Apple, go to [employer] and type in WebEx in the search bar. Download [employer], the blue/green circle. The app is free but as with any other app downloads, their phone may require them to verify saved payment information or Apple password. The patient does NOT have to create an account.  - If Android, ask patient to go to [employer] and type in WebEx in the search bar. Download [employer], the blue/green circle. The app is free but as with any other app downloads, their phone may require them to verify saved payment information or Android password. The patient does NOT have to create an account.   CONSENT FOR TELE-HEALTH VISIT - PLEASE REVIEW  I hereby voluntarily request, consent and authorize [facility] and its employed or contracted physicians, physician assistants, nurse practitioners or other licensed health care professionals (the Practitioner), to provide me with telemedicine health care services (the Services") as deemed necessary by the treating Practitioner. I  acknowledge and consent to receive the Services by the Practitioner via telemedicine. I understand that the telemedicine visit will involve communicating with the Practitioner through live audiovisual communication technology and the disclosure of certain medical information by electronic transmission. I acknowledge that I have been given the opportunity to request an in-person assessment or other available alternative prior to the telemedicine visit and am  voluntarily participating in the telemedicine visit.  I understand that I have the right to withhold or withdraw my consent to the use of telemedicine in the course of my care at any time, without affecting my right to future care or treatment, and that the Practitioner or I may terminate the telemedicine visit at any time. I understand that I have the right to inspect all information obtained and/or recorded in the course of the telemedicine visit and may receive copies of available information for a reasonable fee.  I understand that some of the potential risks of receiving the Services via telemedicine include:   Delay or interruption in medical evaluation due to technological equipment failure or disruption;  Information transmitted may not be sufficient (e.g. poor resolution of images) to allow for appropriate medical decision making by the Practitioner; and/or   In rare instances, security protocols could fail, causing a breach of personal health information.  Furthermore, I acknowledge that it is my responsibility to provide information about my medical history, conditions and care that is complete and accurate to the best of my ability. I acknowledge that Practitioner's advice, recommendations, and/or decision may be based on factors not within their control, such as incomplete or inaccurate data provided by me or distortions of diagnostic images or specimens that may result from electronic transmissions. I understand that the practice of medicine is not an exact science and that Practitioner makes no warranties or guarantees regarding treatment outcomes. I acknowledge that I will receive a copy of this consent concurrently upon execution via email to the email address I last provided but may also request a printed copy by calling the office of [place].    I understand that my insurance will be billed for this visit.   I have read or had this consent read to me.  I understand the  contents of this consent, which adequately explains the benefits and risks of the Services being provided via telemedicine.   I have been provided ample opportunity to ask questions regarding this consent and the Services and have had my questions answered to my satisfaction.  I give my informed consent for the services to be provided through the use of telemedicine in my medical care  By participating in this telemedicine visit I agree to the above.

## 2018-05-12 NOTE — Telephone Encounter (Signed)
RN read over consent information below and patient gave verbal YES with instructions to have wrist BP cuff, thermometer and weight scale available morning of visit.   Marland KitchenYOUR CARDIOLOGY TEAM HAS ARRANGED FOR AN E-VISIT FOR YOUR APPOINTMENT - PLEASE REVIEW IMPORTANT INFORMATION BELOW SEVERAL DAYS PRIOR TO YOUR APPOINTMENT  Due to the recent COVID-19 pandemic, we are transitioning in-person office visits to tele-medicine visits in an effort to decrease unnecessary exposure to our patients and staff. Medicare and most insurances are covering these visits without a copay needed. We also encourage you to sign up for MyChart if you have not already done so. You will need a smartphone if possible. For patients that do not have this, we can still complete the visit using a regular telephone but do prefer a smartphone to enable video when possible. You may have a close family member that lives with you that can help. If possible, we also ask that you have a blood pressure cuff and scale at home to measure your blood pressure, heart rate and weight prior to your scheduled appointment. Patients with clinical needs that need an in-person evaluation and testing will still be able to come to the office if absolutely necessary. If you have any questions, feel free to call our office.    IF YOU HAVE A SMARTPHONE, PLEASE DOWNLOAD THE WEBEX APP TO YOUR SMARTPHONE  - If Apple, go to [employer] and type in WebEx in the search bar. Download [employer], the blue/green circle. The app is free but as with any other app download, your phone may require you to verify saved payment information or Apple password. You do NOT have to create a WebEx account.  - If Android, go to [employer] and type in [employer] in the search bar. Download [employer], the blue/green circle. The app is free but as with any other app download, your phone may require you to verify saved payment information or Android password. You do  NOT have to create a WebEx account.  It is very helpful to have this downloaded before your visit.    2-3 DAYS BEFORE YOUR APPOINTMENT  You will receive a telephone call from one of our [facility] team members - your caller ID may say "Unknown caller." If this is a video visit, we will confirm that you have been able to download the WebEx app. We will remind you check your blood pressure, heart rate and weight prior to your scheduled appointment. If you have an Apple Watch or Kardia, please upload any pertinent ECG strips the day before or morning of your appointment to MyChart. Our staff will also make sure you have reviewed the consent and agree to move forward with your scheduled tele-health visit.     THE DAY OF YOUR APPOINTMENT  Approximately 15 minutes prior to your scheduled appointment, you will receive a telephone call from one of HeartCare team - your caller ID may say "Unknown caller."  Our staff will confirm medications, vital signs for the day and any symptoms you may be experiencing. Please have this information available prior to the time of visit start. It may also be helpful for you to have a pad of paper and pen handy for any instructions given during your visit. They will also walk you through joining the WebEx smartphone meeting if this is a video visit.    CONSENT FOR TELE-HEALTH VISIT - PLEASE RVIEW  I hereby voluntarily request, consent and authorize [facility] and  its employed or Advertising account planner, Producer, television/film/video, nurse practitioners or other licensed health care professionals (the Practitioner), to provide me with telemedicine health care services (the "Services") as deemed necessary by the treating Practitioner. I acknowledge and consent to receive the Services by the Practitioner via telemedicine. I understand that the telemedicine visit will involve communicating with the Practitioner through live audiovisual communication technology and the disclosure of  certain medical information by electronic transmission. I acknowledge that I have been given the opportunity to request an in-person assessment or other available alternative prior to the telemedicine visit and am voluntarily participating in the telemedicine visit.  I understand that I have the right to withhold or withdraw my consent to the use of telemedicine in the course of my care at any time, without affecting my right to future care or treatment, and that the Practitioner or I may terminate the telemedicine visit at any time. I understand that I have the right to inspect all information obtained and/or recorded in the course of the telemedicine visit and may receive copies of available information for a reasonable fee.  I understand that some of the potential risks of receiving the Services via telemedicine include:  Marland Kitchen Delay or interruption in medical evaluation due to technological equipment failure or disruption; . Information transmitted may not be sufficient (e.g. poor resolution of images) to allow for appropriate medical decision making by the Practitioner; and/or  . In rare instances, security protocols could fail, causing a breach of personal health information.  Furthermore, I acknowledge that it is my responsibility to provide information about my medical history, conditions and care that is complete and accurate to the best of my ability. I acknowledge that Practitioner's advice, recommendations, and/or decision may be based on factors not within their control, such as incomplete or inaccurate data provided by me or distortions of diagnostic images or specimens that may result from electronic transmissions. I understand that the practice of medicine is not an exact science and that Practitioner makes no warranties or guarantees regarding treatment outcomes. I acknowledge that I will receive a copy of this consent concurrently upon execution via email to the email address I last provided but  may also request a printed copy by calling the office of [facility].    I understand that my insurance will be billed for this visit.   I have read or had this consent read to me. . I understand the contents of this consent, which adequately explains the benefits and risks of the Services being provided via telemedicine.  . I have been provided ample opportunity to ask questions regarding this consent and the Services and have had my questions answered to my satisfaction. . I give my informed consent for the services to be provided through the use of telemedicine in my medical care  By participating in this telemedicine visit I agree to the above.

## 2018-05-18 ENCOUNTER — Other Ambulatory Visit: Payer: Self-pay

## 2018-05-18 ENCOUNTER — Telehealth (INDEPENDENT_AMBULATORY_CARE_PROVIDER_SITE_OTHER): Payer: Medicare Other | Admitting: Cardiology

## 2018-05-18 ENCOUNTER — Encounter: Payer: Self-pay | Admitting: Cardiology

## 2018-05-18 VITALS — BP 166/80 | HR 69 | Temp 97.8°F | Ht 64.0 in | Wt 126.2 lb

## 2018-05-18 DIAGNOSIS — I10 Essential (primary) hypertension: Secondary | ICD-10-CM

## 2018-05-18 DIAGNOSIS — I480 Paroxysmal atrial fibrillation: Secondary | ICD-10-CM | POA: Diagnosis not present

## 2018-05-18 DIAGNOSIS — E1140 Type 2 diabetes mellitus with diabetic neuropathy, unspecified: Secondary | ICD-10-CM

## 2018-05-18 NOTE — Patient Instructions (Addendum)
Medication Instructions:  Your physician recommends that you continue on your current medications as directed. Please refer to the Current Medication list given to you today.  If you need a refill on your cardiac medications before your next appointment, please call your pharmacy.   Lab work: NONE If you have labs (blood work) drawn today and your tests are completely normal, you will receive your results only by: Marland Kitchen MyChart Message (if you have MyChart) OR . A paper copy in the mail If you have any lab test that is abnormal or we need to change your treatment, we will call you to review the results.  Testing/Procedures: N  Follow-Up: At [facility], you and your health needs are our priority.  As part of our continuing mission to provide you with exceptional heart care, we have created designated Provider Care Teams.  These Care Teams include your primary Cardiologist (physician) and Advanced Practice Providers (APPs -  Physician Assistants and Nurse Practitioners) who all work together to provide you with the care you need, when you need it. You will need a follow up appointment in 6 months.    Any Other Special Instructions Will Be Listed Below   Your physician has requested that you regularly monitor and record your blood pressure readings at home. Please use the same machine at the same time of day to check your readings and record them to bring to your follow-up visit.   RN verbally advised patient to take BP twice daily at the same time. She is already keeping a log. She will get with one of her children/grandchildren to go on amazon/ebay and obtain a smaller cuff for her current monitor. Patient scheduled for follow up telehealth visit and advised to call office, if she has any other immediate needs.

## 2018-05-18 NOTE — Progress Notes (Signed)
Virtual Visit via Video Note   This visit type was conducted due to national recommendations for restrictions regarding the COVID-19 Pandemic (e.g. social distancing) in an effort to limit this patient's exposure and mitigate transmission in our community.  Due to her co-morbid illnesses, this patient is at least at moderate risk for complications without adequate follow up.  This format is felt to be most appropriate for this patient at this time.  All issues noted in this document were discussed and addressed.  A limited physical exam was performed with this format.  Please refer to the patient's chart for her consent [facility].   Evaluation Performed:  Follow-up visit  Date:  05/18/2018   ID:  Elaine Mathews, DOB 1933-11-06, MRN [id number]  Patient Location: Home  Provider Location: Home  PCP:  Lucianne LeiUppin, Nina, MD  Cardiologist:  No primary care provider on file. Electrophysiologist:  None   Chief Complaint: Paroxysmal atrial fibrillation  History of Present Illness:    Elaine HibbsCarol D Bolar is a 85 y.o. female who presents via audio/video conferencing for a telehealth visit today.    Patient is a pleasant 85 year old female.  She has past medical history of essential hypertension, paroxysmal atrial fibrillation and diabetes mellitus.  She denies any problems at this time and takes care of activities of daily living.  No chest pain orthopnea or PND.  She leads a sedentary lifestyle.  At the time of my evaluation, the patient is alert awake oriented and in no distress.  The patient does not have symptoms concerning for COVID-19 infection (fever, chills, cough, or new shortness of breath).    Past Medical History:  Diagnosis Date  . Allergic rhinosinusitis   . Arthritis   . Cataract, bilateral   . Diabetes (HCC)   . Diabetic nephropathy (HCC)   . Elevated liver enzymes   . Essential hypertension   . Fatty liver   . Foot lesion   . Full dentures   . Hyperlipidemia    . Hypothyroidism   . Leg pain, bilateral   . Mild asthma   . Mild asthma   . PVD (peripheral vascular disease) (HCC)   . Vitamin D deficiency    Past Surgical History:  Procedure Laterality Date  . APPENDECTOMY    . CATARACT EXTRACTION    . CHOLECYSTECTOMY    . TONSILLECTOMY       Current Meds  Medication Sig  . colesevelam (WELCHOL) 625 MG tablet Take 1 tablet by mouth 2 (two) times daily with a meal.  . diltiazem (CARDIZEM CD) 180 MG 24 hr capsule Take 180 mg by mouth daily.  . ergocalciferol (VITAMIN D2) 50000 units capsule Take 50,000 Units by mouth once a week.  . ezetimibe (ZETIA) 10 MG tablet Take 10 mg by mouth daily.  . furosemide (LASIX) 20 MG tablet Take 20 mg by mouth as needed.   . Glucosamine-Chondroit-Vit C-Mn (GLUCOSAMINE 1500 COMPLEX PO) Take 1 capsule by mouth 3 (three) times daily.  Marland Kitchen. glucose blood test strip 1 each by Other route as needed. Use as instructed  . levothyroxine (SYNTHROID, LEVOTHROID) 75 MCG tablet Take 75 mcg by mouth daily before breakfast. Take half a tablet mon-fri and sat and Sunday whole  . lisinopril (PRINIVIL,ZESTRIL) 40 MG tablet Take 40 mg by mouth daily.  . memantine (NAMENDA) 10 MG tablet Take 10 mg by mouth 2 (two) times daily.  . metoprolol succinate (TOPROL-XL) 50 MG 24 hr tablet Take 100 mg by  mouth daily. Take with or immediately following a meal.  . montelukast (SINGULAIR) 10 MG tablet Take 10 mg by mouth at bedtime.  . nitroGLYCERIN (NITROSTAT) 0.4 MG SL tablet Place 0.4 mg under the tongue every 5 (five) minutes as needed.  . rivaroxaban (XARELTO) 20 MG TABS tablet Take 1 tablet (20 mg total) by mouth daily with supper.     Allergies:   Codeine and Penicillins   Social History   Tobacco Use  . Smoking status: Former Games developer  . Smokeless tobacco: Never Used  Substance Use Topics  . Alcohol use: No  . Drug use: No     Family Hx: The patient's family history includes COPD in her son; Heart attack in her mother and son;  Hypercholesterolemia in her mother; Hypertension in her father and mother; Stroke in her father and mother.  ROS:   Please see the history of present illness.    Patient is asymptomatic at this time.  She has no particular issues to complain about.  She takes her anticoagulation regularly. All other systems reviewed and are negative.   Prior CV studies:   The following studies were reviewed today:  I discussed my findings with the patient extensively.  Labs/Other Tests and Data Reviewed:    EKG:  No ECG reviewed.  Recent Labs: No results found for requested labs within last 8760 hours.   Recent Lipid Panel No results found for: CHOL, TRIG, HDL, CHOLHDL, LDLCALC, LDLDIRECT  Wt Readings from Last 3 Encounters:  05/18/18 126 lb 3.2 oz (57.2 kg)  11/25/17 125 lb (56.7 kg)  11/17/17 126 lb (57.2 kg)     Objective:    Vital Signs:  BP (!) 166/80 (BP Location: Left Arm, Patient Position: Sitting, Cuff Size: Normal)   Pulse 69   Temp 97.8 F (36.6 C)   Ht 5\' 4"  (1.626 m)   Wt 126 lb 3.2 oz (57.2 kg)   BMI 21.66 kg/m    Well nourished, well developed female in no acute distress. She appeared comfortable and cheerful on the phone.  She initially was anxious about this telemetry visit.  ASSESSMENT & PLAN:    1. Paroxysmal atrial fibrillation: Patient is asymptomatic.  No palpitations or any symptoms related to atrial fibrillation.I discussed with the patient atrial fibrillation, disease process. Management and therapy including rate and rhythm control, anticoagulation benefits and potential risks were discussed extensively with the patient. Patient had multiple questions which were answered to patient's satisfaction. 2. Essential hypertension: Her blood pressure was elevated today.  It appeared that she was anxious about this video conferencing and she mentioned this to me.  I reassured her.  She will keep a track of her blood pressures and let me know if her systolic is greater  than 140.  She has had blood work recently and this is managed by her primary care physician. 3. Patient will be seen in follow-up appointment in 6 months or earlier if the patient has any concerns   COVID-19 Education: The signs and symptoms of COVID-19 were discussed with the patient and how to seek care for testing (follow up with PCP or arrange E-visit).  The importance of social distancing was discussed today.  Time:   Today, I have spent 16 minutes with the patient with telehealth technology discussing the above problems.     Medication Adjustments/Labs and Tests Ordered: Current medicines are reviewed at length with the patient today.  Concerns regarding medicines are outlined above.  Tests Ordered: No orders  of the defined types were placed in this encounter.  Medication Changes: No orders of the defined types were placed in this encounter.   Disposition:  Follow up in 6 month(s)  Signed, Garwin Brothers, MD  05/18/2018 9:25 AM    [facility]

## 2018-06-09 DIAGNOSIS — Z0000 Encounter for general adult medical examination without abnormal findings: Secondary | ICD-10-CM | POA: Diagnosis not present

## 2018-06-09 DIAGNOSIS — Z9181 History of falling: Secondary | ICD-10-CM | POA: Diagnosis not present

## 2018-06-09 DIAGNOSIS — E785 Hyperlipidemia, unspecified: Secondary | ICD-10-CM | POA: Diagnosis not present

## 2018-07-12 ENCOUNTER — Encounter (HOSPITAL_COMMUNITY): Payer: Self-pay | Admitting: Internal Medicine

## 2018-07-12 ENCOUNTER — Inpatient Hospital Stay (HOSPITAL_COMMUNITY)
Admission: AD | Admit: 2018-07-12 | Discharge: 2018-07-18 | DRG: 247 | Disposition: A | Discharge status: Skilled Nursing Facility | Payer: Medicare Other | Source: Other Acute Inpatient Hospital | Attending: Internal Medicine | Admitting: Internal Medicine

## 2018-07-12 DIAGNOSIS — Z9842 Cataract extraction status, left eye: Secondary | ICD-10-CM

## 2018-07-12 DIAGNOSIS — K219 Gastro-esophageal reflux disease without esophagitis: Secondary | ICD-10-CM | POA: Diagnosis not present

## 2018-07-12 DIAGNOSIS — I214 Non-ST elevation (NSTEMI) myocardial infarction: Principal | ICD-10-CM | POA: Diagnosis present

## 2018-07-12 DIAGNOSIS — E871 Hypo-osmolality and hyponatremia: Secondary | ICD-10-CM | POA: Diagnosis not present

## 2018-07-12 DIAGNOSIS — M542 Cervicalgia: Secondary | ICD-10-CM | POA: Diagnosis not present

## 2018-07-12 DIAGNOSIS — R8290 Unspecified abnormal findings in urine: Secondary | ICD-10-CM | POA: Diagnosis not present

## 2018-07-12 DIAGNOSIS — T50995A Adverse effect of other drugs, medicaments and biological substances, initial encounter: Secondary | ICD-10-CM | POA: Diagnosis not present

## 2018-07-12 DIAGNOSIS — K589 Irritable bowel syndrome without diarrhea: Secondary | ICD-10-CM | POA: Diagnosis not present

## 2018-07-12 DIAGNOSIS — W19XXXA Unspecified fall, initial encounter: Secondary | ICD-10-CM | POA: Diagnosis present

## 2018-07-12 DIAGNOSIS — K580 Irritable bowel syndrome with diarrhea: Secondary | ICD-10-CM | POA: Diagnosis not present

## 2018-07-12 DIAGNOSIS — Z955 Presence of coronary angioplasty implant and graft: Secondary | ICD-10-CM

## 2018-07-12 DIAGNOSIS — J45909 Unspecified asthma, uncomplicated: Secondary | ICD-10-CM | POA: Diagnosis not present

## 2018-07-12 DIAGNOSIS — R945 Abnormal results of liver function studies: Secondary | ICD-10-CM | POA: Diagnosis not present

## 2018-07-12 DIAGNOSIS — Z7901 Long term (current) use of anticoagulants: Secondary | ICD-10-CM | POA: Diagnosis not present

## 2018-07-12 DIAGNOSIS — Z79890 Hormone replacement therapy (postmenopausal): Secondary | ICD-10-CM | POA: Diagnosis not present

## 2018-07-12 DIAGNOSIS — I480 Paroxysmal atrial fibrillation: Secondary | ICD-10-CM | POA: Diagnosis not present

## 2018-07-12 DIAGNOSIS — E559 Vitamin D deficiency, unspecified: Secondary | ICD-10-CM | POA: Diagnosis present

## 2018-07-12 DIAGNOSIS — I4510 Unspecified right bundle-branch block: Secondary | ICD-10-CM | POA: Diagnosis not present

## 2018-07-12 DIAGNOSIS — Z79899 Other long term (current) drug therapy: Secondary | ICD-10-CM

## 2018-07-12 DIAGNOSIS — E1151 Type 2 diabetes mellitus with diabetic peripheral angiopathy without gangrene: Secondary | ICD-10-CM | POA: Diagnosis not present

## 2018-07-12 DIAGNOSIS — Y92018 Other place in single-family (private) house as the place of occurrence of the external cause: Secondary | ICD-10-CM | POA: Diagnosis not present

## 2018-07-12 DIAGNOSIS — I4891 Unspecified atrial fibrillation: Secondary | ICD-10-CM | POA: Diagnosis not present

## 2018-07-12 DIAGNOSIS — I160 Hypertensive urgency: Secondary | ICD-10-CM

## 2018-07-12 DIAGNOSIS — I2510 Atherosclerotic heart disease of native coronary artery without angina pectoris: Secondary | ICD-10-CM | POA: Diagnosis not present

## 2018-07-12 DIAGNOSIS — R001 Bradycardia, unspecified: Secondary | ICD-10-CM | POA: Diagnosis present

## 2018-07-12 DIAGNOSIS — M1990 Unspecified osteoarthritis, unspecified site: Secondary | ICD-10-CM | POA: Diagnosis present

## 2018-07-12 DIAGNOSIS — R569 Unspecified convulsions: Secondary | ICD-10-CM | POA: Diagnosis not present

## 2018-07-12 DIAGNOSIS — R1012 Left upper quadrant pain: Secondary | ICD-10-CM | POA: Diagnosis not present

## 2018-07-12 DIAGNOSIS — D735 Infarction of spleen: Secondary | ICD-10-CM | POA: Diagnosis present

## 2018-07-12 DIAGNOSIS — M5489 Other dorsalgia: Secondary | ICD-10-CM | POA: Diagnosis not present

## 2018-07-12 DIAGNOSIS — R52 Pain, unspecified: Secondary | ICD-10-CM | POA: Diagnosis not present

## 2018-07-12 DIAGNOSIS — R7989 Other specified abnormal findings of blood chemistry: Secondary | ICD-10-CM | POA: Diagnosis present

## 2018-07-12 DIAGNOSIS — E1140 Type 2 diabetes mellitus with diabetic neuropathy, unspecified: Secondary | ICD-10-CM | POA: Diagnosis present

## 2018-07-12 DIAGNOSIS — E7800 Pure hypercholesterolemia, unspecified: Secondary | ICD-10-CM | POA: Diagnosis not present

## 2018-07-12 DIAGNOSIS — S3600XA Unspecified injury of spleen, initial encounter: Secondary | ICD-10-CM | POA: Diagnosis not present

## 2018-07-12 DIAGNOSIS — Z885 Allergy status to narcotic agent status: Secondary | ICD-10-CM

## 2018-07-12 DIAGNOSIS — N320 Bladder-neck obstruction: Secondary | ICD-10-CM | POA: Diagnosis present

## 2018-07-12 DIAGNOSIS — Z1159 Encounter for screening for other viral diseases: Secondary | ICD-10-CM

## 2018-07-12 DIAGNOSIS — E1121 Type 2 diabetes mellitus with diabetic nephropathy: Secondary | ICD-10-CM | POA: Diagnosis present

## 2018-07-12 DIAGNOSIS — E1144 Type 2 diabetes mellitus with diabetic amyotrophy: Secondary | ICD-10-CM | POA: Diagnosis not present

## 2018-07-12 DIAGNOSIS — R109 Unspecified abdominal pain: Secondary | ICD-10-CM

## 2018-07-12 DIAGNOSIS — I249 Acute ischemic heart disease, unspecified: Secondary | ICD-10-CM | POA: Diagnosis not present

## 2018-07-12 DIAGNOSIS — E039 Hypothyroidism, unspecified: Secondary | ICD-10-CM | POA: Diagnosis present

## 2018-07-12 DIAGNOSIS — R1030 Lower abdominal pain, unspecified: Secondary | ICD-10-CM | POA: Diagnosis not present

## 2018-07-12 DIAGNOSIS — I455 Other specified heart block: Secondary | ICD-10-CM | POA: Clinically undetermined

## 2018-07-12 DIAGNOSIS — I10 Essential (primary) hypertension: Secondary | ICD-10-CM | POA: Diagnosis present

## 2018-07-12 DIAGNOSIS — Z825 Family history of asthma and other chronic lower respiratory diseases: Secondary | ICD-10-CM

## 2018-07-12 DIAGNOSIS — R112 Nausea with vomiting, unspecified: Secondary | ICD-10-CM | POA: Diagnosis not present

## 2018-07-12 DIAGNOSIS — Z8673 Personal history of transient ischemic attack (TIA), and cerebral infarction without residual deficits: Secondary | ICD-10-CM

## 2018-07-12 DIAGNOSIS — N3000 Acute cystitis without hematuria: Secondary | ICD-10-CM | POA: Diagnosis not present

## 2018-07-12 DIAGNOSIS — G932 Benign intracranial hypertension: Secondary | ICD-10-CM | POA: Diagnosis not present

## 2018-07-12 DIAGNOSIS — R5381 Other malaise: Secondary | ICD-10-CM | POA: Diagnosis not present

## 2018-07-12 DIAGNOSIS — S0990XA Unspecified injury of head, initial encounter: Secondary | ICD-10-CM | POA: Diagnosis not present

## 2018-07-12 DIAGNOSIS — S199XXA Unspecified injury of neck, initial encounter: Secondary | ICD-10-CM | POA: Diagnosis not present

## 2018-07-12 DIAGNOSIS — R51 Headache: Secondary | ICD-10-CM | POA: Diagnosis not present

## 2018-07-12 DIAGNOSIS — R4182 Altered mental status, unspecified: Secondary | ICD-10-CM

## 2018-07-12 DIAGNOSIS — Z7401 Bed confinement status: Secondary | ICD-10-CM | POA: Diagnosis not present

## 2018-07-12 DIAGNOSIS — R0602 Shortness of breath: Secondary | ICD-10-CM | POA: Diagnosis not present

## 2018-07-12 DIAGNOSIS — E782 Mixed hyperlipidemia: Secondary | ICD-10-CM | POA: Diagnosis not present

## 2018-07-12 DIAGNOSIS — R55 Syncope and collapse: Secondary | ICD-10-CM

## 2018-07-12 DIAGNOSIS — Z880 Allergy status to penicillin: Secondary | ICD-10-CM

## 2018-07-12 DIAGNOSIS — R197 Diarrhea, unspecified: Secondary | ICD-10-CM | POA: Diagnosis not present

## 2018-07-12 DIAGNOSIS — S36029A Unspecified contusion of spleen, initial encounter: Secondary | ICD-10-CM | POA: Clinically undetermined

## 2018-07-12 DIAGNOSIS — Z8349 Family history of other endocrine, nutritional and metabolic diseases: Secondary | ICD-10-CM

## 2018-07-12 DIAGNOSIS — M2550 Pain in unspecified joint: Secondary | ICD-10-CM | POA: Diagnosis not present

## 2018-07-12 DIAGNOSIS — R410 Disorientation, unspecified: Secondary | ICD-10-CM | POA: Diagnosis not present

## 2018-07-12 DIAGNOSIS — Z8249 Family history of ischemic heart disease and other diseases of the circulatory system: Secondary | ICD-10-CM

## 2018-07-12 DIAGNOSIS — Z9841 Cataract extraction status, right eye: Secondary | ICD-10-CM

## 2018-07-12 DIAGNOSIS — N390 Urinary tract infection, site not specified: Secondary | ICD-10-CM | POA: Diagnosis not present

## 2018-07-12 DIAGNOSIS — Z823 Family history of stroke: Secondary | ICD-10-CM

## 2018-07-12 DIAGNOSIS — Z87891 Personal history of nicotine dependence: Secondary | ICD-10-CM

## 2018-07-12 DIAGNOSIS — S36029S Unspecified contusion of spleen, sequela: Secondary | ICD-10-CM | POA: Diagnosis not present

## 2018-07-12 HISTORY — DX: Other specified abnormal findings of blood chemistry: R79.89

## 2018-07-12 HISTORY — DX: Non-ST elevation (NSTEMI) myocardial infarction: I21.4

## 2018-07-12 HISTORY — DX: Cerebral infarction, unspecified: I63.9

## 2018-07-12 LAB — GLUCOSE, CAPILLARY: Glucose-Capillary: 122 mg/dL — ABNORMAL HIGH (ref 70–99)

## 2018-07-12 LAB — TROPONIN I: Troponin I: 4.12 ng/mL (ref <0.03)

## 2018-07-12 MED ORDER — ONDANSETRON HCL 4 MG/2ML IJ SOLN
4.0000 mg | Freq: Four times a day (QID) | INTRAMUSCULAR | Status: DC | PRN
  Administered 2018-07-13: 4 mg via INTRAVENOUS
  Filled 2018-07-12: qty 2

## 2018-07-12 MED ORDER — METOPROLOL SUCCINATE ER 100 MG PO TB24
100.0000 mg | ORAL_TABLET | Freq: Every day | ORAL | Status: DC
  Administered 2018-07-13: 100 mg via ORAL
  Filled 2018-07-12 (×2): qty 1

## 2018-07-12 MED ORDER — LEVOTHYROXINE SODIUM 75 MCG PO TABS
75.0000 ug | ORAL_TABLET | Freq: Every day | ORAL | Status: DC
  Administered 2018-07-13 – 2018-07-18 (×5): 75 ug via ORAL
  Filled 2018-07-12 (×5): qty 1

## 2018-07-12 MED ORDER — NITROGLYCERIN IN D5W 200-5 MCG/ML-% IV SOLN
INTRAVENOUS | Status: AC
  Filled 2018-07-12: qty 250

## 2018-07-12 MED ORDER — METOPROLOL SUCCINATE ER 100 MG PO TB24: 100.0000 mg | ORAL_TABLET | Freq: Every day | ORAL | Status: DC

## 2018-07-12 MED ORDER — LISINOPRIL 40 MG PO TABS
40.0000 mg | ORAL_TABLET | Freq: Every day | ORAL | Status: DC
  Administered 2018-07-12 – 2018-07-17 (×5): 40 mg via ORAL
  Filled 2018-07-12 (×5): qty 1

## 2018-07-12 MED ORDER — HEPARIN (PORCINE) 25000 UT/250ML-% IV SOLN
1000.0000 [IU]/h | INTRAVENOUS | Status: DC
  Administered 2018-07-12 (×2): 900 [IU]/h via INTRAVENOUS
  Filled 2018-07-12: qty 250

## 2018-07-12 MED ORDER — ASPIRIN EC 81 MG PO TBEC
81.0000 mg | DELAYED_RELEASE_TABLET | Freq: Every day | ORAL | Status: DC
  Administered 2018-07-13: 81 mg via ORAL
  Filled 2018-07-12: qty 1

## 2018-07-12 MED ORDER — ASPIRIN 81 MG PO CHEW
324.0000 mg | CHEWABLE_TABLET | ORAL | Status: AC
  Administered 2018-07-12: 324 mg via ORAL
  Filled 2018-07-12: qty 4

## 2018-07-12 MED ORDER — ASPIRIN 300 MG RE SUPP: 300.0000 mg | RECTAL | Status: AC

## 2018-07-12 MED ORDER — ATORVASTATIN CALCIUM 40 MG PO TABS
40.0000 mg | ORAL_TABLET | Freq: Every day | ORAL | Status: DC
  Administered 2018-07-12 – 2018-07-17 (×5): 40 mg via ORAL
  Filled 2018-07-12 (×5): qty 1

## 2018-07-12 MED ORDER — INSULIN ASPART 100 UNIT/ML ~~LOC~~ SOLN
0.0000 [IU] | SUBCUTANEOUS | Status: DC
  Administered 2018-07-13 (×2): 1 [IU] via SUBCUTANEOUS
  Administered 2018-07-13: 2 [IU] via SUBCUTANEOUS
  Administered 2018-07-14 (×2): 1 [IU] via SUBCUTANEOUS

## 2018-07-12 MED ORDER — DILTIAZEM HCL ER COATED BEADS 180 MG PO CP24
180.0000 mg | ORAL_CAPSULE | Freq: Every day | ORAL | Status: DC
  Administered 2018-07-13: 180 mg via ORAL
  Filled 2018-07-12: qty 1

## 2018-07-12 MED ORDER — ACETAMINOPHEN 325 MG PO TABS
650.0000 mg | ORAL_TABLET | ORAL | Status: DC | PRN
  Administered 2018-07-14 – 2018-07-15 (×2): 650 mg via ORAL
  Filled 2018-07-12 (×2): qty 2

## 2018-07-12 MED ORDER — NITROGLYCERIN IN D5W 200-5 MCG/ML-% IV SOLN
0.0000 ug/min | INTRAVENOUS | Status: DC
  Administered 2018-07-13: 10 ug/min via INTRAVENOUS

## 2018-07-12 MED ORDER — NITROGLYCERIN IN D5W 200-5 MCG/ML-% IV SOLN
0.0000 ug/min | INTRAVENOUS | Status: DC
  Administered 2018-07-12: 5 ug/min via INTRAVENOUS

## 2018-07-12 MED ORDER — LABETALOL HCL 5 MG/ML IV SOLN
5.0000 mg | INTRAVENOUS | Status: DC | PRN
  Administered 2018-07-13: 5 mg via INTRAVENOUS
  Filled 2018-07-12: qty 4

## 2018-07-12 NOTE — Progress Notes (Signed)
ANTICOAGULATION CONSULT NOTE - Initial Consult  Pharmacy Consult for Heparin, PTA Xarelto Indication: chest pain/ACS  Allergies  Allergen Reactions  . Codeine   . Penicillins     Patient Measurements: Height: 5\' 4"  (162.6 cm) Weight: 125 lb 11.2 oz (57 kg) IBW/kg (Calculated) : 54.7 Heparin Dosing Weight: 57 kg  Vital Signs: Temp: 97.3 F (36.3 C) (05/31 2002) Temp Source: Oral (05/31 1958) BP: 116/97 (05/31 2002) Pulse Rate: 96 (05/31 2002)  Labs: No results for input(s): HGB, HCT, PLT, APTT, LABPROT, INR, HEPARINUNFRC, HEPRLOWMOCWT, CREATININE, CKTOTAL, CKMB, TROPONINI in the last 72 hours.  CrCl cannot be calculated (No successful lab value found.).   Medical History: Past Medical History:  Diagnosis Date  . Allergic rhinosinusitis   . Arthritis   . Cataract, bilateral   . Diabetes (HCC)   . Diabetic nephropathy (HCC)   . Elevated liver enzymes   . Essential hypertension   . Fatty liver   . Foot lesion   . Full dentures   . Hyperlipidemia   . Hypothyroidism   . Leg pain, bilateral   . Mild asthma   . Mild asthma   . PVD (peripheral vascular disease) (HCC)   . Vitamin D deficiency    Medications:  Scheduled:  Infusions:   Assessment: 62 yoF  [facility] with syncopal episode, N/V/D. Troponins elevated > [facility] for probable cardiac cath. Begin Heparin infusion, noted that patient received last Xarelto 20mg  on 5/30.  Begin Heparin, no load. Note Xarelto can increase heparin levels, will monitor aPTT, likely dose based on aPTT until Heparin levels correlate with aPTT.      Goal of Therapy:  Heparin level 0.3-0.7 units/ml aPTT 66-102 seconds Monitor platelets by anticoagulation protocol: Yes   Plan:  Heparin infusion at 900 units/hr Check aPTT and Heparin level in 6 hr Order daily aPTT, Hep levels when at steady state  Otho Bellows PharmD 07/12/2018,8:05 PM

## 2018-07-12 NOTE — H&P (Signed)
History and Physical    Elaine Mathews:[id number] DOB: November 03, 1933 DOA: 07/12/2018  PCP: Lucianne Lei, MD  Patient coming from: [facility]  I have personally briefly reviewed patient's old medical records in [facility]  Chief Complaint: Syncope  HPI: Elaine Mathews is a 85 y.o. female with medical history significant of PAF on xarelto, prior stroke, HTN, reported DM2 though not on any meds.  Patient for unclear reasons decided to take herself off of all meds other than synthroid, now tells me "it was a stupid thing".  She was in normal state of health, until today she had syncopal episode at home, BLE weakness, slid down wall she thinks.  Following syncopal episode she had chest "discomfort", R shoulder pain and L pinky pain that is ongoing now at this time.  Also had N/V/D.  Patient seen in [facility] ED.   ED Course: In the ED she was found to have Trop of 0.24 initially, 0.93 on repeat.  EKG shows an old RBBB.  BP 191/110.  Creat 0.5, LFTs in the 70s (chronic), UA with trace LE, pos nitrites, 11-20 WBC.  Given dose of levaquin.  COVID negative.  Given labetalol, started on heparin gtt (hadnt been taking coumadin), transferred to [facility] for NSTEMI and heart cath.  CT head and neck showed no acute findings (just an old stroke, c/w her reported history of such).  Mild chest "discomfort" and R shoulder pain and L pinky pain.  No SOB.  Review of Systems: As per HPI otherwise 10 point review of systems negative.   Past Medical History:  Diagnosis Date   Allergic rhinosinusitis    Arthritis    Cataract, bilateral    Diabetes (HCC)    Diabetic nephropathy (HCC)    Elevated liver enzymes    Essential hypertension    Fatty liver    Foot lesion    Full dentures    Hyperlipidemia    Hypothyroidism    Leg pain, bilateral    Mild asthma    Mild asthma    PVD (peripheral vascular disease) (HCC)    Stroke (HCC)    Vitamin D deficiency     Past Surgical History:   Procedure Laterality Date   APPENDECTOMY     CATARACT EXTRACTION     CHOLECYSTECTOMY     TONSILLECTOMY       reports that she has quit smoking. She has never used smokeless tobacco. She reports that she does not drink alcohol or use drugs.  Allergies  Allergen Reactions   Codeine    Penicillins     Family History  Problem Relation Age of Onset   Hypertension Mother    Stroke Mother    Heart attack Mother    Hypercholesterolemia Mother    Hypertension Father    Stroke Father    Heart attack Son    COPD Son      Prior to Admission medications   Medication Sig Start Date End Date Taking? Authorizing Provider  colesevelam (WELCHOL) 625 MG tablet Take 1 tablet by mouth 2 (two) times daily with a meal.    [provider]  diltiazem (CARDIZEM CD) 180 MG 24 hr capsule Take 180 mg by mouth daily.    [provider]  ergocalciferol (VITAMIN D2) 50000 units capsule Take 50,000 Units by mouth once a week.    [provider]  ezetimibe (ZETIA) 10 MG tablet Take 10 mg by mouth daily.    [provider]  furosemide (  LASIX) 20 MG tablet Take 20 mg by mouth as needed.     [provider]  Glucosamine-Chondroit-Vit C-Mn (GLUCOSAMINE 1500 COMPLEX PO) Take 1 capsule by mouth 3 (three) times daily.    [provider]  glucose blood test strip 1 each by Other route as needed. Use as instructed    [provider]  levothyroxine (SYNTHROID, LEVOTHROID) 75 MCG tablet Take 75 mcg by mouth daily before breakfast. Take half a tablet mon-fri and sat and Sunday whole    [provider]  lisinopril (PRINIVIL,ZESTRIL) 40 MG tablet Take 40 mg by mouth daily.    [provider]  memantine (NAMENDA) 10 MG tablet Take 10 mg by mouth 2 (two) times daily.    [provider]  metoprolol succinate (TOPROL-XL) 50 MG 24 hr tablet Take 100 mg by mouth daily. Take with or immediately following a meal.    [provider]  montelukast (SINGULAIR) 10 MG tablet Take 10 mg by mouth at bedtime.    [provider]  nitroGLYCERIN (NITROSTAT) 0.4 MG SL tablet Place 0.4 mg under the tongue every 5 (five) minutes as needed.    [provider]  rivaroxaban (XARELTO) 20 MG TABS tablet Take 1 tablet (20 mg total) by mouth daily with supper. 04/30/18   RevankarAundra Dubin, Rajan R, MD    Physical Exam: Vitals:   07/12/18 1856 07/12/18 1958 07/12/18 2002  BP: (!) 172/72 (!) 113/97 (!) 116/97  Pulse: 89 84 96  Resp:  19 15  Temp: 98.8 F (37.1 C) 98.2 F (36.8 C) (!) 97.3 F (36.3 C)  TempSrc: Oral Oral   SpO2: 100% 98% 97%  Weight: 57 kg    Height: 5\' 4"  (1.626 m)      Constitutional: NAD, calm, comfortable Eyes: PERRL, lids and conjunctivae normal ENMT: Mucous membranes are moist. Posterior pharynx clear of any exudate or lesions.Normal dentition.  Neck: normal, supple, no masses, no thyromegaly Respiratory: clear to auscultation bilaterally, no wheezing, no crackles. Normal respiratory effort. No accessory muscle use.  Cardiovascular: Regular rate and rhythm, no murmurs / rubs / gallops. No extremity edema. 2+ pedal pulses. No carotid bruits.  Abdomen: no tenderness, no masses palpated. No hepatosplenomegaly. Bowel sounds positive.  Musculoskeletal: no clubbing / cyanosis. No joint deformity upper and lower extremities. Good ROM, no contractures. Normal muscle tone.  Skin: no rashes, lesions, ulcers. No induration Neurologic: CN 2-12 grossly intact. Sensation intact, DTR normal. Strength 5/5 in all 4. Some difficulty with word finding which patient states is chronic since her old stroke and not at all new today. Psychiatric: Normal judgment and insight. Alert and oriented x 3. Normal mood.    Labs on Admission: I have personally reviewed following labs and imaging studies  CBC: No results for input(s): WBC, NEUTROABS, HGB, HCT, MCV, PLT in the last 168 hours. Basic Metabolic Panel: No  results for input(s): NA, K, CL, CO2, GLUCOSE, BUN, CREATININE, CALCIUM, MG, PHOS in the last 168 hours. GFR: CrCl cannot be calculated (No successful lab value found.). Liver Function Tests: No results for input(s): AST, ALT, ALKPHOS, BILITOT, PROT, ALBUMIN in the last 168 hours. No results for input(s): LIPASE, AMYLASE in the last 168 hours. No results for input(s): AMMONIA in the last 168 hours. Coagulation Profile: No results for input(s): INR, PROTIME in the last 168 hours. Cardiac Enzymes: No results for input(s): CKTOTAL, CKMB, CKMBINDEX, TROPONINI in the last 168 hours. BNP (last 3 results) No results for input(s): PROBNP in  the last 8760 hours. HbA1C: No results for input(s): HGBA1C in the last 72 hours. CBG: No results for input(s): GLUCAP in the last 168 hours. Lipid Profile: No results for input(s): CHOL, HDL, LDLCALC, TRIG, CHOLHDL, LDLDIRECT in the last 72 hours. Thyroid Function Tests: No results for input(s): TSH, T4TOTAL, FREET4, T3FREE, THYROIDAB in the last 72 hours. Anemia Panel: No results for input(s): VITAMINB12, FOLATE, FERRITIN, TIBC, IRON, RETICCTPCT in the last 72 hours. Urine analysis: No results found for: COLORURINE, APPEARANCEUR, LABSPEC, PHURINE, GLUCOSEU, HGBUR, BILIRUBINUR, KETONESUR, PROTEINUR, UROBILINOGEN, NITRITE, LEUKOCYTESUR  Radiological Exams on Admission: No results found.  EKG: Independently reviewed.  Assessment/Plan Principal Problem:   NSTEMI (non-ST elevated myocardial infarction) (HCC) Active Problems:   Paroxysmal atrial fibrillation (HCC)   Hypertension   Controlled type 2 diabetes with neuropathy (HCC)   Hypertensive urgency   Elevated LFTs    1. NSTEMI - 1. ACS pathway 2. Giving ASA now 3. On heparin gtt 4. Starting NTG gtt 5. I spoke with cards on call, let them know of her arrival 6. Serial trops 7. Tele monitor 8. Presumably to get [facility] tomorrow as next step 9. Will defer 2d echo decision (given syncope) to  cards 10. FLP and A1C in AM 11. Statin ordered to start tomorrow evening, please adjust dose ordered based on FLP 2. HTN urgency - due to her stopping all meds at home on Tues 1. Labetalol given in ED 2. NTG gtt titrate to CP resolution 3. Resume lisinopril 40mg  PO QHS first dose now 4. Resume Metoprolol and cardizem tomorrow AM 5. In the mean time if BP still elevated tonight after NTG titration and lisinopril, will put in PRN order for labetalol 3. PAF - 1. Heparin gtt 2. Currently sinus 3. Resuming cardizem and metoprolol in AM 4. Tele monitor 4. Nitrites in urine - 1. 1 dose levaquin in ED 2. Will hold off on further ABx pending UCx 3. UCx ordered 5. Chronic LFT elevation - with fatty liver 1. Presumably the 70s-80s they got at [facility] today are her baseline 2. CMP in AM 3. Keep eye on LFTs when starting statin, dont think this is a contraindication at this point though given her history of strokes, and now a NSTEMI. 6. DM2 - 1. Listed in chart but not on any home meds 2. Will put on SSI Q4H sensitive scale for the moment  DVT prophylaxis: Heparin gtt Code Status: Full Family Communication: No family in room Disposition Plan: Home after admit Consults called: Cardiology Admission status: Admit to inpatient  Severity of Illness: The appropriate patient status for this patient is INPATIENT. Inpatient status is judged to be reasonable and necessary in order to provide the required intensity of service to ensure the patient's safety. The patient's presenting symptoms, physical exam findings, and initial radiographic and laboratory data in the context of their chronic comorbidities is felt to place them at high risk for further clinical deterioration. Furthermore, it is not anticipated that the patient will be medically stable for discharge from the hospital within 2 midnights of admission. The following factors support the patient status of inpatient.   IP status for NSTEMI, likely to  need heart cath, NTG gtt, heparin gtt.   * I certify that at the point of admission it is my clinical judgment that the patient will require inpatient hospital care spanning beyond 2 midnights from the point of admission due to high intensity of service, high risk for further deterioration and high frequency of surveillance required.*  ,  Judie Petit DO [facility]  How to contact the [facility] Attending or Consulting provider 7A - 7P or covering provider during after hours 7P -7A, for this patient?  1. Check the care team in [facility] and look for a) attending/consulting TR[facility] provider listed and b) the [facility] team listed 2. Log into [email or web address]  Amion Physician Scheduling and messaging for groups and whole hospitals  On call and physician scheduling software for group practices, residents, hospitalists and other medical providers for call, clinic, rotation and shift schedules. OnCall [employer] is a hospital-wide system for scheduling doctors and paging doctors on call. EasyPlot is for scientific plotting and data analysis.  [email or web address]  and use [place]'s universal password to access. If you do not have the password, please contact the hospital operator.  3. Locate the [facility] provider you are looking for under [facility] and page to a number that you can be directly reached. 4. If you still have difficulty reaching the provider, please page the [facility] (Director on Call) for the Hospitalists listed on amion for assistance.  07/12/2018, 8:25 PM

## 2018-07-12 NOTE — Consult Note (Signed)
[facility] Consult Note   Primary Physician: Lucianne LeiUppin, Nina, MD Primary Cardiologist:  Belva Cromeajan Revankar, MD  Reason for Consultation: Chest discomfort  HPI:    Elaine Mathews is an 85 year old female with a past medical history significant for paroxysmal atrial fibrillation (supposed to be on Xarelto), prior CVA, uncontrolled hypertension, diabetes mellitus, hyperlipidemia, hypothyroidism, and vitamin D deficiency who presents to the hospital after a syncopal episode. The patient states that she was at home and felt dizzy and then fell. She is unsure whether she completely passed out. She did bruise the back of her head. Her grandson was in the house at the time but he did not witness the episode. Afterwards she had chest discomfort on the left side without radiation. She also had nausea and vomited once. No palpitations or diaphoresis. She has been off of all her cardiac medications since last Tuesday (07/07/2018). She says "I don't think they are working". The only medication she was still on was the synthroid.  Her troponin I at 19:59 was 4.12. She is known to have a right bundle branch block morphology on her ECG. Interestingly she had a prolonged QTc of 524 msec. In the hospital she is hypertensive with SBPs as high as 190 mmHg. [facility] and then [facility] work-up. She is currently chest pain free while on a NTG infusion. Also placed on IV heparin.  CT of Head and Neck showed no acute findings.   Home Medications Prior to Admission medications   Medication Sig Start Date End Date Taking? Authorizing Provider  colesevelam (WELCHOL) 625 MG tablet Take 1 tablet by mouth 2 (two) times daily with a meal.    [provider]  diltiazem (CARDIZEM CD) 180 MG 24 hr capsule Take 180 mg by mouth daily.    [provider]  ergocalciferol (VITAMIN D2) 50000 units capsule Take 50,000 Units by mouth once a week.    [provider]  ezetimibe (ZETIA) 10 MG tablet Take 10 mg by mouth daily.    [provider]  furosemide (LASIX) 20 MG tablet Take 20 mg by mouth as needed.     [provider]  Glucosamine-Chondroit-Vit C-Mn (GLUCOSAMINE 1500 COMPLEX PO) Take 1 capsule by mouth 3 (three) times daily.    [provider]  glucose blood test strip 1 each by Other route as needed. Use as instructed    [provider]  levothyroxine (SYNTHROID, LEVOTHROID) 75 MCG tablet Take 75 mcg by mouth daily before breakfast. Take half a tablet mon-fri and sat and Sunday whole    [provider]  lisinopril (PRINIVIL,ZESTRIL) 40 MG tablet Take 40 mg by mouth daily.    [provider]  memantine (NAMENDA) 10 MG tablet Take 10 mg by mouth 2 (two) times daily.    [provider]  metoprolol succinate (TOPROL-XL) 50 MG 24 hr tablet Take 100 mg by mouth daily. Take with or immediately following a meal.    [provider]  montelukast (SINGULAIR) 10 MG tablet Take 10 mg by mouth at bedtime.    [provider]  nitroGLYCERIN (NITROSTAT) 0.4 MG SL tablet Place 0.4 mg under the tongue every 5 (five) minutes as needed.    [provider]  rivaroxaban (XARELTO) 20 MG TABS tablet Take 1 tablet (20 mg total) by mouth daily with supper. 04/30/18   Revankar, Aundra Dubinajan R, MD    Past Medical History: Past Medical  History:  Diagnosis Date  . Allergic rhinosinusitis   . Arthritis   . Cataract, bilateral   . Diabetes (HCC)   . Diabetic nephropathy (HCC)   . Elevated liver enzymes   . Essential hypertension   . Fatty liver   . Foot lesion   . Full dentures   . Hyperlipidemia   . Hypothyroidism   . Leg pain, bilateral   . Mild asthma   . Mild asthma   . PVD (peripheral vascular disease) (HCC)   . Stroke (HCC)   . Vitamin D deficiency     Past Surgical History: Past Surgical History:  Procedure Laterality Date  . APPENDECTOMY    . CATARACT  EXTRACTION    . CHOLECYSTECTOMY    . TONSILLECTOMY      Family History: Family History  Problem Relation Age of Onset  . Hypertension Mother   . Stroke Mother   . Heart attack Mother   . Hypercholesterolemia Mother   . Hypertension Father   . Stroke Father   . Heart attack Son   . COPD Son     Social History: Social History   Socioeconomic History  . Marital status: Divorced    Spouse name: Not on file  . Number of children: Not on file  . Years of education: Not on file  . Highest education level: Not on file  Occupational History  . Not on file  Social Needs  . Financial resource strain: Not on file  . Food insecurity:    Worry: Not on file    Inability: Not on file  . Transportation needs:    Medical: Not on file    Non-medical: Not on file  Tobacco Use  . Smoking status: Former Games developer  . Smokeless tobacco: Never Used  Substance and Sexual Activity  . Alcohol use: No  . Drug use: No  . Sexual activity: Not on file  Lifestyle  . Physical activity:    Days per week: Not on file    Minutes per session: Not on file  . Stress: Not on file  Relationships  . Social connections:    Talks on phone: Not on file    Gets together: Not on file    Attends religious service: Not on file    Active member of club or organization: Not on file    Attends meetings of clubs or organizations: Not on file    Relationship status: Not on file  Other Topics Concern  . Not on file  Social History Narrative  . Not on file    Allergies:  Allergies  Allergen Reactions  . Codeine   . Penicillins      Review of Systems: [y] = yes, [ ]  = no   . General: Weight gain [ ] ; Weight loss [ ] ; Anorexia [ ] ; Fatigue [ ] ; Fever [ ] ; Chills [ ] ; Weakness [ ]   . Cardiac: Chest pain/pressure [Y ]; Resting SOB [ ] ; Exertional SOB [ ] ; Orthopnea [ ] ; Pedal Edema [ ] ; Palpitations [ ] ; Syncope [Y ]; Presyncope [ ] ; Paroxysmal nocturnal dyspnea[ ]   . Pulmonary: Cough [ ] ; Wheezing[ ] ;  Hemoptysis[ ] ; Sputum [ ] ; Snoring [ ]   . GI: Vomiting[ ] ; Dysphagia[ ] ; Melena[ ] ; Hematochezia [ ] ; Heartburn[ ] ; Abdominal pain [ ] ; Constipation [ ] ; Diarrhea [ ] ; BRBPR [ ]   . GU: Hematuria[ ] ; Dysuria [ ] ; Nocturia[ ]   . Vascular: Pain in legs with walking [ ] ; Pain in feet  with lying flat [ ] ; Non-healing sores [ ] ; Stroke [ ] ; TIA [ ] ; Slurred speech [ ] ;  . Neuro: Headaches[ ] ; Vertigo[ ] ; Seizures[ ] ; Paresthesias[ ] ;Blurred vision [ ] ; Diplopia [ ] ; Vision changes [ ]   . Ortho/Skin: Arthritis [ ] ; Joint pain [ ] ; Muscle pain [ ] ; Joint swelling [ ] ; Back Pain [ ] ; Rash [ ]   . Psych: Depression[ ] ; Anxiety[ ]   . Heme: Bleeding problems [ ] ; Clotting disorders [ ] ; Anemia [ ]   . Endocrine: Diabetes [ ] ; Thyroid dysfunction[ ]      Objective:    Vital Signs:   Temp:  [97.3 F (36.3 C)-98.8 F (37.1 C)] 97.3 F (36.3 C) (05/31 2002) Pulse Rate:  [84-96] 96 (05/31 2002) Resp:  [15-19] 15 (05/31 2002) BP: (113-172)/(72-97) 116/97 (05/31 2002) SpO2:  [97 %-100 %] 97 % (05/31 2002) Weight:  [57 kg] 57 kg (05/31 1856)    Weight change: Filed Weights   07/12/18 1856  Weight: 57 kg    Intake/Output:  No intake or output data in the 24 hours ending 07/12/18 2148    Physical Exam    General:  Well appearing. No resp difficulty HEENT: normal Neck: supple. JVP . Carotids 2+ bilat; no bruits. No lymphadenopathy or thyromegaly appreciated. Cor: PMI nondisplaced. Regular rate & rhythm. No rubs, gallops or murmurs. Lungs: clear to auscultation bilaterally Abdomen: soft, nontender, nondistended. No hepatosplenomegaly. No bruits or masses. Good bowel sounds. Extremities: no cyanosis, clubbing, rash, edema Neuro: alert & orientedx3, cranial nerves grossly intact. moves all 4 extremities w/o difficulty. Affect pleasant    EKG    NSR, rate 87 bpm, incomplete RBBB, QTc 524 ms (prolonged)  Labs   Basic Metabolic Panel: No results for input(s): NA, K, CL, CO2, GLUCOSE, BUN,  CREATININE, CALCIUM, MG, PHOS in the last 168 hours.  Liver Function Tests: No results for input(s): AST, ALT, ALKPHOS, BILITOT, PROT, ALBUMIN in the last 168 hours. No results for input(s): LIPASE, AMYLASE in the last 168 hours. No results for input(s): AMMONIA in the last 168 hours.  CBC: No results for input(s): WBC, NEUTROABS, HGB, HCT, MCV, PLT in the last 168 hours.  Cardiac Enzymes: Recent Labs  Lab 07/12/18 1959  TROPONINI 4.12*    BNP: BNP (last 3 results) No results for input(s): BNP in the last 8760 hours.  ProBNP (last 3 results) No results for input(s): PROBNP in the last 8760 hours.   CBG: Recent Labs  Lab 07/12/18 2026  GLUCAP 122*    Coagulation Studies: No results for input(s): LABPROT, INR in the last 72 hours.   Imaging    No results found.   Medications:     Current Medications: . aspirin  324 mg Oral NOW   Or  . aspirin  300 mg Rectal NOW  . [START ON 07/13/2018] aspirin EC  81 mg Oral Daily  . [START ON 07/13/2018] atorvastatin  40 mg Oral q1800  . [START ON 07/13/2018] diltiazem  180 mg Oral Daily  . insulin aspart  0-9 Units Subcutaneous Q4H  . [START ON 07/13/2018] levothyroxine  75 mcg Oral QAC breakfast  . lisinopril  40 mg Oral QHS  . [START ON 07/13/2018] metoprolol succinate  100 mg Oral Daily     Infusions: . heparin 900 Units/hr (07/12/18 2121)  . nitroGLYCERIN    . nitroGLYCERIN 5 mcg/min (07/12/18 2127)       Assessment/Plan   1. Elevated troponin, presyncope/syncope (in the setting of chest pain and  prolonged QTc)  - The patient has the following risk factors for CAD: age, hypertension, and hyperlipidemia - Symptoms of chest discomfort and syncope are worrisome for possible ACS - Monitor on telemetry - Maintain K >4.0 and Mg >2.0 - Trend cardiac biomarkers and obtain serial ECGs (also monitor QT interval) - Start aspirin 81 mg daily - Consider high dose statins (such as Atorvastatin 80 mg nightly). Check a lipid  panel in the morning. - Agree with intravenous unfractionated heparin per pharmacy protocol - Obtain a transthoracic echocardiogram to evaluate LV systolic and diastolic function. -  Reinitiate antihypertensive regimen as tolerated -  Keep patient NPO after midnight -  Recommend cardiac catheterization to define the coronary anatomy.   Lonie Peak, MD  07/12/2018, 9:48 PM  Cardiology Overnight Team Please contact [facility] for night-coverage after hours (4p -7a ) and weekends on [email or web address]

## 2018-07-12 NOTE — Plan of Care (Signed)
Transfer from [facility] discussed with Dr. Littie Deeds. Elaine Mathews is a 85 year old female with past medical history of PAF on Xarelto, HTN, HLD, DM type II,  PVD, and hypothyroidism; who presented after having a syncopal episode at home with complaints of nausea, vomiting, and diarrhea.  Patient noted to be elevated at 191/110 and orthostatic vital signs reported to be within normal limits.  Labs revealed hemoglobin 11.7, the BUN 12, creatinine 0.5, and initial troponin 0.24.  EKG was noted to be within normal limits with a right bundle branch block seen on previous EKGs.  Repeat troponin elevated at 0.93.  Cardiology at [facility] felt patient likely needed to have cardiac cath.  Transfer to a progressive bed as inpatient.  ED physician to start heparin drip as patient last took her Xarelto yesterday.

## 2018-07-13 ENCOUNTER — Encounter (HOSPITAL_COMMUNITY)
Admission: AD | Disposition: A | Discharge status: Skilled Nursing Facility | Payer: Self-pay | Source: Other Acute Inpatient Hospital | Attending: Internal Medicine

## 2018-07-13 ENCOUNTER — Inpatient Hospital Stay (HOSPITAL_COMMUNITY): Payer: Medicare Other

## 2018-07-13 DIAGNOSIS — I480 Paroxysmal atrial fibrillation: Secondary | ICD-10-CM

## 2018-07-13 DIAGNOSIS — I2510 Atherosclerotic heart disease of native coronary artery without angina pectoris: Secondary | ICD-10-CM

## 2018-07-13 DIAGNOSIS — I214 Non-ST elevation (NSTEMI) myocardial infarction: Principal | ICD-10-CM

## 2018-07-13 DIAGNOSIS — R569 Unspecified convulsions: Secondary | ICD-10-CM

## 2018-07-13 DIAGNOSIS — I455 Other specified heart block: Secondary | ICD-10-CM

## 2018-07-13 DIAGNOSIS — R410 Disorientation, unspecified: Secondary | ICD-10-CM

## 2018-07-13 DIAGNOSIS — R1030 Lower abdominal pain, unspecified: Secondary | ICD-10-CM

## 2018-07-13 HISTORY — DX: Other specified heart block: I45.5

## 2018-07-13 HISTORY — PX: LEFT HEART CATH AND CORONARY ANGIOGRAPHY: CATH118249

## 2018-07-13 HISTORY — PX: CORONARY PRESSURE/FFR STUDY: CATH118243

## 2018-07-13 HISTORY — PX: CORONARY STENT INTERVENTION: CATH118234

## 2018-07-13 LAB — LIPID PANEL
Cholesterol: 197 mg/dL (ref 0–200)
HDL: 53 mg/dL (ref >40)
LDL Cholesterol: 121 mg/dL — ABNORMAL HIGH (ref 0–99)
Total CHOL/HDL Ratio: 3.7 RATIO
Triglycerides: 116 mg/dL (ref <150)
VLDL: 23 mg/dL (ref 0–40)

## 2018-07-13 LAB — COMPREHENSIVE METABOLIC PANEL
ALT: 38 U/L (ref 0–44)
ALT: 38 U/L (ref 0–44)
AST: 92 U/L — ABNORMAL HIGH (ref 15–41)
AST: 78 U/L — ABNORMAL HIGH (ref 15–41)
Albumin: 3.4 g/dL — ABNORMAL LOW (ref 3.5–5.0)
Albumin: 3.6 g/dL (ref 3.5–5.0)
Alkaline Phosphatase: 58 U/L (ref 38–126)
Alkaline Phosphatase: 58 U/L (ref 38–126)
Anion gap: 12 (ref 5–15)
Anion gap: 10 (ref 5–15)
BUN: 7 mg/dL — ABNORMAL LOW (ref 8–23)
BUN: 7 mg/dL — ABNORMAL LOW (ref 8–23)
CO2: 21 mmol/L — ABNORMAL LOW (ref 22–32)
CO2: 24 mmol/L (ref 22–32)
Calcium: 8.7 mg/dL — ABNORMAL LOW (ref 8.9–10.3)
Calcium: 8.9 mg/dL (ref 8.9–10.3)
Chloride: 99 mmol/L (ref 98–111)
Chloride: 99 mmol/L (ref 98–111)
Creatinine, Ser: 0.65 mg/dL (ref 0.44–1.00)
Creatinine, Ser: 0.56 mg/dL (ref 0.44–1.00)
GFR calc Af Amer: >60 mL/min (ref >60)
GFR calc Af Amer: >60 mL/min (ref >60)
GFR calc non Af Amer: >60 mL/min (ref >60)
GFR calc non Af Amer: >60 mL/min (ref >60)
Glucose, Bld: 138 mg/dL — ABNORMAL HIGH (ref 70–99)
Glucose, Bld: 150 mg/dL — ABNORMAL HIGH (ref 70–99)
Potassium: 3.5 mmol/L (ref 3.5–5.1)
Potassium: 3.6 mmol/L (ref 3.5–5.1)
Sodium: 132 mmol/L — ABNORMAL LOW (ref 135–145)
Sodium: 133 mmol/L — ABNORMAL LOW (ref 135–145)
Total Bilirubin: 0.9 mg/dL (ref 0.3–1.2)
Total Bilirubin: 1 mg/dL (ref 0.3–1.2)
Total Protein: 6.4 g/dL — ABNORMAL LOW (ref 6.5–8.1)
Total Protein: 6.9 g/dL (ref 6.5–8.1)

## 2018-07-13 LAB — CBC WITH DIFFERENTIAL/PLATELET
Abs Immature Granulocytes: 0.03 10*3/uL (ref 0.00–0.07)
Basophils Absolute: 0 10*3/uL (ref 0.0–0.1)
Basophils Relative: 0 %
Eosinophils Absolute: 0 10*3/uL (ref 0.0–0.5)
Eosinophils Relative: 1 %
HCT: 33.9 % — ABNORMAL LOW (ref 36.0–46.0)
Hemoglobin: 11.3 g/dL — ABNORMAL LOW (ref 12.0–15.0)
Immature Granulocytes: 0 %
Lymphocytes Relative: 13 %
Lymphs Abs: 1 10*3/uL (ref 0.7–4.0)
MCH: 30.1 pg (ref 26.0–34.0)
MCHC: 33.3 g/dL (ref 30.0–36.0)
MCV: 90.2 fL (ref 80.0–100.0)
Monocytes Absolute: 0.6 10*3/uL (ref 0.1–1.0)
Monocytes Relative: 7 %
Neutro Abs: 6.1 10*3/uL (ref 1.7–7.7)
Neutrophils Relative %: 79 %
Platelets: 121 10*3/uL — ABNORMAL LOW (ref 150–400)
RBC: 3.76 MIL/uL — ABNORMAL LOW (ref 3.87–5.11)
RDW: 13.8 % (ref 11.5–15.5)
WBC: 7.7 10*3/uL (ref 4.0–10.5)
nRBC: 0 % (ref 0.0–0.2)

## 2018-07-13 LAB — MAGNESIUM: Magnesium: 1.6 mg/dL — ABNORMAL LOW (ref 1.7–2.4)

## 2018-07-13 LAB — GLUCOSE, CAPILLARY
Glucose-Capillary: 154 mg/dL — ABNORMAL HIGH (ref 70–99)
Glucose-Capillary: 135 mg/dL — ABNORMAL HIGH (ref 70–99)
Glucose-Capillary: 144 mg/dL — ABNORMAL HIGH (ref 70–99)
Glucose-Capillary: 136 mg/dL — ABNORMAL HIGH (ref 70–99)

## 2018-07-13 LAB — BLOOD GAS, ARTERIAL
Acid-base deficit: 0.3 mmol/L (ref 0.0–2.0)
Bicarbonate: 23.4 mmol/L (ref 20.0–28.0)
Drawn by: 330991
O2 Content: 2 L/min
O2 Saturation: 98.4 %
Patient temperature: 98.6
pCO2 arterial: 36 mmHg (ref 32.0–48.0)
pH, Arterial: 7.429 (ref 7.350–7.450)
pO2, Arterial: 111 mmHg — ABNORMAL HIGH (ref 83.0–108.0)

## 2018-07-13 LAB — PROTIME-INR
INR: 1.1 (ref 0.8–1.2)
Prothrombin Time: 14.2 seconds (ref 11.4–15.2)

## 2018-07-13 LAB — CBC
HCT: 32 % — ABNORMAL LOW (ref 36.0–46.0)
Hemoglobin: 10.7 g/dL — ABNORMAL LOW (ref 12.0–15.0)
MCH: 30.1 pg (ref 26.0–34.0)
MCHC: 33.4 g/dL (ref 30.0–36.0)
MCV: 89.9 fL (ref 80.0–100.0)
Platelets: 122 10*3/uL — ABNORMAL LOW (ref 150–400)
RBC: 3.56 MIL/uL — ABNORMAL LOW (ref 3.87–5.11)
RDW: 13.7 % (ref 11.5–15.5)
WBC: 5.7 10*3/uL (ref 4.0–10.5)
nRBC: 0 % (ref 0.0–0.2)

## 2018-07-13 LAB — HEMOGLOBIN A1C
Hgb A1c MFr Bld: 6.5 % — ABNORMAL HIGH (ref 4.8–5.6)
Mean Plasma Glucose: 139.85 mg/dL

## 2018-07-13 LAB — TSH: TSH: 3.049 u[IU]/mL (ref 0.350–4.500)

## 2018-07-13 LAB — TROPONIN I
Troponin I: 7.48 ng/mL (ref <0.03)
Troponin I: 7.17 ng/mL (ref <0.03)

## 2018-07-13 LAB — APTT
aPTT: 127 seconds — ABNORMAL HIGH (ref 24–36)
aPTT: 39 seconds — ABNORMAL HIGH (ref 24–36)

## 2018-07-13 LAB — AMMONIA: Ammonia: 14 umol/L (ref 9–35)

## 2018-07-13 LAB — AMYLASE: Amylase: 12 U/L — ABNORMAL LOW (ref 28–100)

## 2018-07-13 LAB — PHOSPHORUS: Phosphorus: 3.7 mg/dL (ref 2.5–4.6)

## 2018-07-13 LAB — PROCALCITONIN: Procalcitonin: <0.1 ng/mL

## 2018-07-13 LAB — POCT ACTIVATED CLOTTING TIME
Activated Clotting Time: 263 seconds
Activated Clotting Time: 274 seconds

## 2018-07-13 LAB — LACTIC ACID, PLASMA: Lactic Acid, Venous: 1.2 mmol/L (ref 0.5–1.9)

## 2018-07-13 LAB — HEPARIN LEVEL (UNFRACTIONATED)
Heparin Unfractionated: 0.42 IU/mL (ref 0.30–0.70)
Heparin Unfractionated: 0.3 IU/mL (ref 0.30–0.70)

## 2018-07-13 LAB — LIPASE, BLOOD: Lipase: 21 U/L (ref 11–51)

## 2018-07-13 SURGERY — LEFT HEART CATH AND CORONARY ANGIOGRAPHY
Anesthesia: LOCAL

## 2018-07-13 MED ORDER — MIDAZOLAM HCL 2 MG/2ML IJ SOLN
INTRAMUSCULAR | Status: DC | PRN
  Administered 2018-07-13 (×2): 0.5 mg via INTRAVENOUS

## 2018-07-13 MED ORDER — NITROGLYCERIN 1 MG/10 ML FOR IR/CATH LAB
INTRA_ARTERIAL | Status: AC
  Filled 2018-07-13: qty 10

## 2018-07-13 MED ORDER — HEPARIN (PORCINE) IN NACL 1000-0.9 UT/500ML-% IV SOLN
INTRAVENOUS | Status: AC
  Filled 2018-07-13: qty 500

## 2018-07-13 MED ORDER — SODIUM CHLORIDE 0.9% FLUSH: 3.0000 mL | INTRAVENOUS | Status: DC | PRN

## 2018-07-13 MED ORDER — HEPARIN SODIUM (PORCINE) 1000 UNIT/ML IJ SOLN
INTRAMUSCULAR | Status: DC | PRN
  Administered 2018-07-13 (×2): 3000 [IU] via INTRAVENOUS
  Administered 2018-07-13 (×2): 1000 [IU] via INTRAVENOUS

## 2018-07-13 MED ORDER — SODIUM CHLORIDE 0.9 % WEIGHT BASED INFUSION
3.0000 mL/kg/h | INTRAVENOUS | Status: DC
  Administered 2018-07-13: 3 mL/kg/h via INTRAVENOUS

## 2018-07-13 MED ORDER — MIDAZOLAM HCL 2 MG/2ML IJ SOLN
INTRAMUSCULAR | Status: AC
  Filled 2018-07-13: qty 2

## 2018-07-13 MED ORDER — SODIUM CHLORIDE 0.9 % IV SOLN
250.0000 mL | INTRAVENOUS | Status: DC | PRN
  Administered 2018-07-13: 10 mL via INTRAVENOUS

## 2018-07-13 MED ORDER — IOHEXOL 350 MG/ML SOLN
INTRAVENOUS | Status: DC | PRN
  Administered 2018-07-13: 170 mL via INTRA_ARTERIAL

## 2018-07-13 MED ORDER — LIDOCAINE HCL (PF) 1 % IJ SOLN
INTRAMUSCULAR | Status: DC | PRN
  Administered 2018-07-13: 2 mL via INTRADERMAL

## 2018-07-13 MED ORDER — SODIUM CHLORIDE 0.9 % IV SOLN: INTRAVENOUS | Status: DC

## 2018-07-13 MED ORDER — SODIUM CHLORIDE 0.9 % IV SOLN: 250.0000 mL | INTRAVENOUS | Status: DC | PRN

## 2018-07-13 MED ORDER — SODIUM CHLORIDE 0.9 % WEIGHT BASED INFUSION: 1.0000 mL/kg/h | INTRAVENOUS | Status: DC

## 2018-07-13 MED ORDER — MAGNESIUM SULFATE 50 % IJ SOLN: 3.0000 g | Freq: Once | INTRAVENOUS | Status: DC

## 2018-07-13 MED ORDER — MAGNESIUM SULFATE 50 % IJ SOLN
3.0000 g | Freq: Once | INTRAVENOUS | Status: AC
  Administered 2018-07-14: 3 g via INTRAVENOUS
  Filled 2018-07-13: qty 6

## 2018-07-13 MED ORDER — SODIUM CHLORIDE 0.9% FLUSH
3.0000 mL | Freq: Two times a day (BID) | INTRAVENOUS | Status: DC
  Administered 2018-07-14 – 2018-07-18 (×8): 3 mL via INTRAVENOUS

## 2018-07-13 MED ORDER — NITROGLYCERIN 1 MG/10 ML FOR IR/CATH LAB
INTRA_ARTERIAL | Status: DC | PRN
  Administered 2018-07-13 (×2): 200 ug via INTRACORONARY

## 2018-07-13 MED ORDER — SODIUM CHLORIDE 0.9% FLUSH
3.0000 mL | Freq: Two times a day (BID) | INTRAVENOUS | Status: DC
  Administered 2018-07-13 – 2018-07-18 (×7): 3 mL via INTRAVENOUS

## 2018-07-13 MED ORDER — VERAPAMIL HCL 2.5 MG/ML IV SOLN
INTRAVENOUS | Status: DC | PRN
  Administered 2018-07-13: 10 mL via INTRA_ARTERIAL

## 2018-07-13 MED ORDER — CLOPIDOGREL BISULFATE 75 MG PO TABS
75.0000 mg | ORAL_TABLET | Freq: Every day | ORAL | Status: DC
  Administered 2018-07-14 – 2018-07-18 (×5): 75 mg via ORAL
  Filled 2018-07-13 (×5): qty 1

## 2018-07-13 MED ORDER — CLOPIDOGREL BISULFATE 300 MG PO TABS
ORAL_TABLET | ORAL | Status: AC
  Filled 2018-07-13: qty 2

## 2018-07-13 MED ORDER — LACTATED RINGERS IV SOLN
INTRAVENOUS | Status: DC
  Administered 2018-07-13 – 2018-07-14 (×2): via INTRAVENOUS
  Filled 2018-07-13: qty 1000

## 2018-07-13 MED ORDER — HEPARIN (PORCINE) IN NACL 1000-0.9 UT/500ML-% IV SOLN
INTRAVENOUS | Status: DC | PRN
  Administered 2018-07-13 (×2): 500 mL

## 2018-07-13 MED ORDER — CLOPIDOGREL BISULFATE 300 MG PO TABS
ORAL_TABLET | ORAL | Status: DC | PRN
  Administered 2018-07-13: 600 mg via ORAL

## 2018-07-13 MED ORDER — VERAPAMIL HCL 2.5 MG/ML IV SOLN
INTRAVENOUS | Status: AC
  Filled 2018-07-13: qty 2

## 2018-07-13 MED ORDER — LEVETIRACETAM IN NACL 1500 MG/100ML IV SOLN
1500.0000 mg | Freq: Once | INTRAVENOUS | Status: AC
  Administered 2018-07-13: 1500 mg via INTRAVENOUS
  Filled 2018-07-13: qty 100

## 2018-07-13 MED ORDER — LEVETIRACETAM IN NACL 500 MG/100ML IV SOLN
500.0000 mg | Freq: Two times a day (BID) | INTRAVENOUS | Status: DC
  Administered 2018-07-14 – 2018-07-15 (×3): 500 mg via INTRAVENOUS
  Filled 2018-07-13 (×3): qty 100

## 2018-07-13 MED ORDER — LIDOCAINE HCL (PF) 1 % IJ SOLN
INTRAMUSCULAR | Status: AC
  Filled 2018-07-13: qty 30

## 2018-07-13 MED ORDER — FENTANYL CITRATE (PF) 100 MCG/2ML IJ SOLN
INTRAMUSCULAR | Status: AC
  Filled 2018-07-13: qty 2

## 2018-07-13 MED ORDER — HYDRALAZINE HCL 20 MG/ML IJ SOLN: 10.0000 mg | INTRAMUSCULAR | Status: AC | PRN

## 2018-07-13 MED ORDER — FENTANYL CITRATE (PF) 100 MCG/2ML IJ SOLN
INTRAMUSCULAR | Status: DC | PRN
  Administered 2018-07-13: 12.5 ug via INTRAVENOUS
  Administered 2018-07-13: 25 ug via INTRAVENOUS
  Administered 2018-07-13: 12.5 ug via INTRAVENOUS

## 2018-07-13 MED ORDER — HEPARIN SODIUM (PORCINE) 1000 UNIT/ML IJ SOLN
INTRAMUSCULAR | Status: AC
  Filled 2018-07-13: qty 1

## 2018-07-13 SURGICAL SUPPLY — 20 items
BALLN EMERGE MR 2.25X8 (BALLOONS) ×2
BALLN ~~LOC~~ EMERGE MR 2.75X12 (BALLOONS) ×2
BALLOON EMERGE MR 2.25X8 (BALLOONS) ×1 IMPLANT
BALLOON ~~LOC~~ EMERGE MR 2.75X12 (BALLOONS) ×1 IMPLANT
CATH 5FR JL3.5 JR4 ANG PIG MP (CATHETERS) ×2 IMPLANT
CATH VISTA GUIDE 6FR XBLAD3.0 (CATHETERS) ×2 IMPLANT
COVER DOME SNAP 22 D (MISCELLANEOUS) ×2 IMPLANT
DEVICE RAD TR BAND REGULAR (VASCULAR PRODUCTS) ×2 IMPLANT
GLIDESHEATH SLEND SS 6F .021 (SHEATH) ×2 IMPLANT
GUIDEWIRE INQWIRE 1.5J.035X260 (WIRE) ×1 IMPLANT
GUIDEWIRE PRESSURE COMET II (WIRE) ×2 IMPLANT
INQWIRE 1.5J .035X260CM (WIRE) ×2
KIT ENCORE 26 ADVANTAGE (KITS) ×2 IMPLANT
KIT ESSENTIALS PG (KITS) ×2 IMPLANT
KIT HEART LEFT (KITS) ×2 IMPLANT
PACK CARDIAC CATHETERIZATION (CUSTOM PROCEDURE TRAY) ×2 IMPLANT
STENT RESOLUTE ONYX 2.75X18 (Permanent Stent) ×2 IMPLANT
TRANSDUCER W/STOPCOCK (MISCELLANEOUS) ×2 IMPLANT
TUBING CIL FLEX 10 FLL-RA (TUBING) ×2 IMPLANT
WIRE ASAHI GRAND SLAM 180CM (WIRE) ×2 IMPLANT

## 2018-07-13 NOTE — Progress Notes (Signed)
Pt was crying and stating her chest pain and high BP were coming from home.  It is because of the issue with great grand son. She wanted to make doctor aware of it.  Dr. Caleb Popp made aware.  Hinton Dyer, RN

## 2018-07-13 NOTE — Progress Notes (Signed)
[facility] PROGRESS NOTE  FEVEN PETTERSON BSW:[id number] DOB: 07-Mar-1933 DOA: 07/12/2018 PCP: Lucianne Lei, MD  Assessment/Plan: 1. NSTEMI.   Troponin peak of7.48, nonischemic EKG DES to proximal/mid LAD (6/1), heparin for now, discontinue aspirin tomorrow and initiate clopidogrel and rivaroxaban 20 mg x12 months for occluded, branch and apical LAD disease  2. Symptomatic sinus pause. Witnessed LOC by nursing this pm, telemetry confirmed sinus pause . Interventional cardiologist will discuss with EP. D/c'd diltiazem and Toprol  3. Syncope. Initially thought related to NSTEMI; however sinus pause on telemetry after repeat episode after stent placed this afternoon. Reports previous fall at home related to having to use BM, vasovagal episode? discontinue nodal blockers(dilt, toprol), monitor on telemetry. Cardiology will discuss with EP  4. Confusion and difficulty following commands. History of stroke, reports no residual weakness, has difficulty with LUE on my exam, Alert and oriented x 4, no aphasia or facial droop.  Seems different from admitting physician exam (5/5 strength reported yesterday). Stat CT head ordered.   5. Paroxysmal AF. NSR currently. Holding dilt and BB given sinus pause. Already on xarelto at homeMonitor on telemetry  6. Prior stroke hx. Unclear timing as patient unclear of timing.   7. Abnormal UA. No dysuria. UA(t [place]) trace Leuks, pos nitrites, 11-20 WBC. S/p levaquin at [facility]. cx pending, no symptoms here  8. Chronic diarrhea. States hx of IBS, on welchol for  9. Hypothyroidism. Continue Synthroid  10. Hypertension. Stable. Continue lisinopril. Hold BB and CCB given symptomatic sinus Pause. Presented with syncope and occurred after LHC witnessed by nursing. Concer. Initially presumed related to above but now  11. Hyperlipidemia. LDL 121. Started atorvastatin    Code Status: FULL   Family Communication: Spoke with son Elmer Picker at [phone] ,  confirms 6- 8 months ago at [facility] hospital but doesn't think she had any weakness after (indicate person spoken with, relationship, and if by phone, the number)  Disposition Plan: Monitor for bradycardia/pause on telemetry, on anticoagulation for NSTEMI, CT head to ensure no acute neurologic involvement   Consultants:  Cardiology   Procedures:  Left heart cath, 6/1 FINDINGS: 1. Two vessel coronary artery disease with multifocal LAD (40% proximal, 70% mid, and 80 apical).  Proximal and mid LAD disease is significant by DFR (0.74).  Culpril lesion for NSTEMI is likely occluded small OM branch, which is too small for PCI. 2. Normal left ventricular systolic function with mildly elevated LVEDP (15-20 mmHg). 3. Successful DFR-guided PCI to the proximal/mid LAD using a Resolute Onyx 2.75 x 18 mm drug-eluting stent with 0% residual stenosis and TIMI-3 flow.  Antibiotics:  none (indicate start date, and stop date if known)  HPI/Subjective:  Elaine Mathews is a 85 y.o. year old female with medical history significant for prior stroke (at [place]), PAF on xarelto, HTN, HLD, who presented to [facility] ED on 07/12/2018 with sudden diaphoresis, no shoulder pain, nausea followed by weakness fall (reported no loss of consciousness/head trauma) and was found to have elevated troponin ( 0.24-0.93) and EKG with old RBBB which prompted transfer to [facility] for further management for NSTEMI.  Denies chest pain Denies shoulder pain Denies any dyspnea  Objective: Vitals:   07/13/18 1330 07/13/18 1335  BP: 131/63 129/67  Pulse: 60 69  Resp: 16 (!) 21  Temp:    SpO2: 100% 100%    Intake/Output Summary (Last 24 hours) at 07/13/2018 1508 Last data filed at 07/13/2018 1002 Gross per 24 hour  Intake 102.89 ml  Output 1 ml  Net 101.89 ml   Filed Weights   07/12/18 1856 07/13/18 0442  Weight: 57 kg 56.5 kg    Exam:   General: Elderly female, no distress  Cardiovascular: Regular rate  and rhythm, no appreciable murmurs rubs or gallops, no edema  Respiratory: Normal respiratory effort on 2 L, normal breath sounds  Abdomen: Soft, nontender, normal bowel sounds  Musculoskeletal: No range of motion  Skin no rashes or lesions  Neurologic alert is to person, place, time, context.  No appreciable focal deficits  Data Reviewed: Basic Metabolic Panel: Recent Labs  Lab 07/13/18 0811  NA 132*  K 3.5  CL 99  CO2 21*  GLUCOSE 138*  BUN 7*  CREATININE 0.65  CALCIUM 8.7*   Liver Function Tests: Recent Labs  Lab 07/13/18 0811  AST 92*  ALT 38  ALKPHOS 58  BILITOT 0.9  PROT 6.4*  ALBUMIN 3.4*   No results for input(s): LIPASE, AMYLASE in the last 168 hours. No results for input(s): AMMONIA in the last 168 hours. CBC: Recent Labs  Lab 07/13/18 0811  WBC 5.7  HGB 10.7*  HCT 32.0*  MCV 89.9  PLT 122*   Cardiac Enzymes: Recent Labs  Lab 07/12/18 1959 07/13/18 0234 07/13/18 0811  TROPONINI 4.12* 7.48* 7.17*   BNP (last 3 results) No results for input(s): BNP in the last 8760 hours.  ProBNP (last 3 results) No results for input(s): PROBNP in the last 8760 hours.  CBG: Recent Labs  Lab 07/12/18 2026 07/13/18 0737 07/13/18 1416  GLUCAP 122* 154* 135*    No results found for this or any previous visit (from the past 240 hour(s)).   Studies: No results found.  Scheduled Meds: . aspirin EC  81 mg Oral Daily  . atorvastatin  40 mg Oral q1800  . [START ON 07/14/2018] clopidogrel  75 mg Oral Q breakfast  . diltiazem  180 mg Oral Daily  . insulin aspart  0-9 Units Subcutaneous Q4H  . levothyroxine  75 mcg Oral Q0600  . lisinopril  40 mg Oral QHS  . metoprolol succinate  100 mg Oral Daily  . sodium chloride flush  3 mL Intravenous Q12H  . sodium chloride flush  3 mL Intravenous Q12H   Continuous Infusions: . sodium chloride 50 mL/hr at 07/13/18 1407  . sodium chloride    . nitroGLYCERIN 15 mcg/min (07/13/18 0817)    Principal Problem:    NSTEMI (non-ST elevated myocardial infarction) [facility]) Active Problems:   Paroxysmal atrial fibrillation (HCC)   Hypertension   Controlled type 2 diabetes with neuropathy (HCC)   Hypertensive urgency   Elevated LFTs      Laverna Peace  [facility]

## 2018-07-13 NOTE — Progress Notes (Signed)
Troponin jumped to 7.48 but patient is resting pain-free at this time, will continue to monitor, text paged Dr Clarisa Kindred will reulsts and update.

## 2018-07-13 NOTE — Progress Notes (Addendum)
Noted pt having seizure like activity while lying in bed. Called out her name and she did not respond to it for a few moment, then she responded saying "shut up."  She was unable to respond to questions, only responding "stop", or "shut up."  Noted her LUE movement became better from previous assessment.  Called rapid response for status change and Dr. Caleb Popp was also made aware.  While talking to Dr. Caleb Popp, pt had another episode of long sinus arrest.  Applied ZOL and obtained EKG.  Pt had incontinent diarrhea and urine when she had a sinus pause.   Unable to remove air from since she was resisting.   Hinton Dyer, RN

## 2018-07-13 NOTE — Progress Notes (Signed)
Noted pt having difficulty controlling her LUE while transferring her to the bathroom. Had shaffling gate.  Pt started hallucinating in the bathroom.  Dr. Okey Dupre came in while assessing pt at that time.  Dr. Caleb Popp was also informed.  Pt was c/o tenderness in LUQ and headache.  Received order to wean nitro off by DR. End, and  STAT CT of the head and DC toplol and cardizem orders by Dr. Caleb Popp.  Patient was placed on NPO since she did not pass yale swallow screen.  While pt was sipping water, she had a long pause and passed out.  Dr. Okey Dupre and Dr. Caleb Popp made aware.  We were unable to obtain 12 lead EKG due to pt's tremors.   Hinton Dyer, RN

## 2018-07-13 NOTE — H&P (View-Only) (Signed)
Progress Note  Patient Name: Elaine Mathews Date of Encounter: 07/13/2018  Primary Cardiologist: No primary care provider on file.   Subjective   No complaints this morning.   Inpatient Medications    Scheduled Meds: . aspirin EC  81 mg Oral Daily  . atorvastatin  40 mg Oral q1800  . diltiazem  180 mg Oral Daily  . insulin aspart  0-9 Units Subcutaneous Q4H  . levothyroxine  75 mcg Oral Q0600  . lisinopril  40 mg Oral QHS  . metoprolol succinate  100 mg Oral Daily   Continuous Infusions: . heparin 900 Units/hr (07/13/18 0600)  . nitroGLYCERIN    . nitroGLYCERIN 15 mcg/min (07/13/18 0817)   PRN Meds: acetaminophen, labetalol, ondansetron (ZOFRAN) IV   Vital Signs    Vitals:   07/13/18 0353 07/13/18 0407 07/13/18 0442 07/13/18 0738  BP: (!) 126/51 (!) 143/68 (!) 142/65 (!) 138/52  Pulse: 78 77 86 76  Resp:   18 18  Temp:   98.2 F (36.8 C) 98.6 F (37 C)  TempSrc:   Oral Oral  SpO2: 93% 93% 95% 99%  Weight:   56.5 kg   Height:        Intake/Output Summary (Last 24 hours) at 07/13/2018 0845 Last data filed at 07/13/2018 0600 Gross per 24 hour  Intake 102.89 ml  Output -  Net 102.89 ml   Last 3 Weights 07/13/2018 07/12/2018 05/18/2018  Weight (lbs) 124 lb 9.6 oz 125 lb 11.2 oz 126 lb 3.2 oz  Weight (kg) 56.518 kg 57.017 kg 57.244 kg      Telemetry    SR - Personally Reviewed  ECG    SR with RBBB TWI in septal leads, prolonged Qt interval - Personally Reviewed  Physical Exam   GEN: No acute distress.   Neck: No JVD Cardiac: RRR, no murmurs, rubs, or gallops.  Respiratory: Clear to auscultation bilaterally. GI: Soft, nontender, non-distended  MS: No edema; No deformity. Neuro:  Nonfocal  Psych: Normal affect   Labs    ChemistryNo results for input(s): NA, K, CL, CO2, GLUCOSE, BUN, CREATININE, CALCIUM, PROT, ALBUMIN, AST, ALT, ALKPHOS, BILITOT, GFRNONAA, GFRAA, ANIONGAP in the last 168 hours.   Hematology Recent Labs  Lab 07/13/18 0811  WBC 5.7   RBC 3.56*  HGB 10.7*  HCT 32.0*  MCV 89.9  MCH 30.1  MCHC 33.4  RDW 13.7  PLT 122*    Cardiac Enzymes Recent Labs  Lab 07/12/18 1959 07/13/18 0234  TROPONINI 4.12* 7.48*   No results for input(s): TROPIPOC in the last 168 hours.   BNPNo results for input(s): BNP, PROBNP in the last 168 hours.   DDimer No results for input(s): DDIMER in the last 168 hours.   Radiology    No results found.  Cardiac Studies   N/a   Patient Profile     85 y.o. female with a past medical history significant for paroxysmal atrial fibrillation (supposed to be on Xarelto), prior CVA, uncontrolled hypertension, diabetes mellitus, hyperlipidemia, hypothyroidism, and vitamin D deficiency who presented to the hospital after a syncopal episode.  Assessment & Plan    1. NSTEMI in the setting of near syncope: Denies any chest pain. Reports feeling lightheaded and sliding down the wall into the floor on Sunday morning with great grandson calling EMS. Troponin currently 7.48. EKG without acute ischemia. Does have concerning RFs for ACS. Would favor further ischemic work up with cardiac cath to define coronary anatomy. If no CAD noted would  need outpatient monitoring for arrhythmia. -- The patient understands that risks included but are not limited to stroke (1 in 1000), death (1 in 1000), kidney failure [usually temporary] (1 in 500), bleeding (1 in 200), allergic reaction [possibly serious] (1 in 200).  -- remains on IV heparin -- echo pending  2. HTN: Reports she stopped taking her medications about a week ago because she felt they were not helping. Bp was significantly elevated at [place]. Now improved with resumption of meds. Currently on BB, CCB and nitro gtt. Attempt to wean nitro post cath.   3. PAF: should have been on Xarelto prior to admission but stopped taking on 07/07/18 along with all other meds. Currently in SR -- on IV heparin  4. HL: LDL noted at 121, on high dose statin added on  admission.   5. DM: does not appear to be on home meds for this.  -- SSI while inpatient -- Hgb A1c 6.5, she thinks this may be diet controlled  6. Prolonged QT: noted on admission, improved on this morning EKG  For questions or updates, please contact [facility] Please consult [email or web address] for contact info under        Signed, Laverda Page, NP  07/13/2018, 8:45 AM    I have personally seen and examined this patient. I agree with the assessment and plan as outlined above.  She had mild CP this am. Troponin elevated c/w NSTEMI. Will need cardiac cath later today. If PCI is needed would use Plavix as she is on Xarelto as an outpatient for PAF. I personally reviewed the risks of the cath procedure and she agrees to proceed. Labs pending this am.   Verne Carrow 07/13/2018 9:10 AM

## 2018-07-13 NOTE — Progress Notes (Signed)
ANTICOAGULATION CONSULT NOTE  Pharmacy Consult for Heparin, PTA Xarelto Indication: chest pain/ACS  Allergies  Allergen Reactions  . Codeine Other (See Comments)    Patient doesn't remember the reaction, too long ago  . Penicillins Other (See Comments)    Patient doesn't remember the reaction, too long ago.    Patient Measurements: Height: 5\' 4"  (162.6 cm) Weight: 124 lb 9.6 oz (56.5 kg) IBW/kg (Calculated) : 54.7 Heparin Dosing Weight: 57 kg  Vital Signs: Temp: 98.6 F (37 C) (06/01 0738) Temp Source: Oral (06/01 0738) BP: 138/52 (06/01 0738) Pulse Rate: 76 (06/01 0738)  Labs: Recent Labs    07/12/18 1959 07/13/18 0234 07/13/18 0811  HGB  --   --  10.7*  HCT  --   --  32.0*  PLT  --   --  122*  APTT  --  127*  --   HEPARINUNFRC  --  0.42 0.30  CREATININE  --   --  0.65  TROPONINI 4.12* 7.48* 7.17*    Estimated Creatinine Clearance: 46 mL/min (by C-G formula based on SCr of 0.65 mg/dL).   Medical History: Past Medical History:  Diagnosis Date  . Allergic rhinosinusitis   . Arthritis   . Cataract, bilateral   . Diabetes (HCC)   . Diabetic nephropathy (HCC)   . Elevated liver enzymes   . Essential hypertension   . Fatty liver   . Foot lesion   . Full dentures   . Hyperlipidemia   . Hypothyroidism   . Leg pain, bilateral   . Mild asthma   . Mild asthma   . PVD (peripheral vascular disease) (HCC)   . Stroke (HCC)   . Vitamin D deficiency    Medications:  Scheduled:  Infusions:   Assessment: 10 yoF  [facility] with syncopal episode, N/V/D. Troponins elevated > [facility] for probable cardiac cath. Begin Heparin infusion, noted that patient received last Xarelto 20mg  on 5/30.  2nd heparin level lower end therapeutic at 0.30    Goal of Therapy:  Heparin level 0.3-0.7 units/ml aPTT 66-102 seconds Monitor platelets by anticoagulation protocol: Yes   Plan:  Increase heparin gtt to 1000 units/hr F/u 8 hour heparin level  Daylene Posey,  PharmD Clinical Pharmacist Please check AMION for all [facility] numbers 07/13/2018 9:47 AM

## 2018-07-13 NOTE — Consult Note (Signed)
NAME:  Elaine Mathews, MRN:  [id number], DOB:  [birth year], LOS: 1 ADMISSION DATE:  07/12/2018, CONSULTATION DATE: 07/13/2018 REFERRING MD:  Dr. Roberto Scales, CHIEF COMPLAINT: Seizure, abdominal pain, bradycardia   Brief History   Called to evaluate this 85 year old female who presented with an STEMI culminating in cardiac catheterization resulting in stent placement in the LAD.  She had been on nitro drip and heparin drip but this was discontinued after the catheterization. She has had some episodes of unstable this this afternoon exemplified by bradycardia which then transcended to long asystolic pauses.  On one such occasion this led to low output seizure and she was started on Keppra after the neurology service evaluated her.  CT scan was unable to be done secondary to concern for her condition. Critical care service was asked to evaluate her for transfer to higher level of care.  History of present illness   Elaine Mathews is an 85 year old female who was originally seen and transferred from  [facility] for chest pain and syncope. Has medical history with PAF on Xarelto, HTN, HLD, DM type II,  PVD, and hypothyroidism. She initially presented after having a syncopal episode at home with complaints of nausea, vomiting, and diarrhea.  Initial Bp high 191/110 . Initial troponin 0.24.  EKG was unremarkable except right bundle branch block seen on previous EKGs.  Repeat troponin elevated at 0.93.   Went for heart catheterization, synopsis below: 1. Two vessel coronary artery disease with multifocal LAD (40% proximal, 60-70% mid, and 80% apical stenoses). Proximal and mid LAD disease is highly significant by DFR (0.74). Culpril lesion for NSTEMI is likely occluded small OM2 branch, which is too small for PCI. 2. Normal left ventricular systolic function with mildly elevated filling pressure (LVEDP 15-20 mmHg). 3. Successful DFR-guided PCI to the proximal/mid LAD using a Resolute Onyx 2.75 x 18 mm  drug-eluting stent with 0% residual stenosis and TIMI-3 flow.  Recommendations 1. Medical therapy for occluded OM2 branch and apical LAD disease. 2. Restart heparin 2 hours after TR band removal, given history of paroxysmal atrial fibrillation. If no bleeding complications, recommend discontinuation of aspirin tomorrow and initiation of rivaroxaban 20 mg daily. Rivaroxaban and clopidogrel should be continued for at least 12 months, if possible. 3. Aggressive secondary prevention  Past Medical History   Allergic rhinosinusitis    . Arthritis   . Cataract, bilateral   . Diabetes (HCC)   . Diabetic nephropathy (HCC)   . Elevated liver enzymes   . Essential hypertension   . Fatty liver   . Foot lesion   . Full dentures   . Hyperlipidemia   . Hypothyroidism   . Leg pain, bilateral   . Mild asthma   . Mild asthma   . PVD (peripheral vascular disease) (HCC)   . Stroke (HCC)   . Vitamin D deficiency    Past Surgical History:  Procedure Laterality Date  . APPENDECTOMY    . CATARACT EXTRACTION    . CHOLECYSTECTOMY    . TONSILLECTOMY     Social History  Previous tobacco use. No use of smokeless tobacco, alcohol or recreational/abusive drugs Family History  Problem Relation Age of Onset  . Hypertension Mother   . Stroke Mother   . Heart attack Mother   . Hypercholesterolemia Mother   . Hypertension Father   . Stroke Father   . Heart attack Son   . COPD Son     Review of Systems:   Endorses abdominal discomfort.  Has had diarrhea but none today No chest pain, no dyspnea Some what lethargic and uncooperative Denies headache or visual changes.    Significant Hospital Events   As noted above patient had multiple episodes of bradycardia and sinus pause 1 of which culminated in noted seizure. She has had questionable mental status changes related to Versed and fentanyl given earlier however the seizure today is first for her.  No history of  seizures.  Consults:  Critical care Cardiology Internal medicine  Procedures:  Cardiac cath  Significant Diagnostic Tests:  Echocardiogram Cardiac cath  Micro Data:  None  Antimicrobials:  None  Objective   Blood pressure (!) 143/65, pulse 72, temperature 98.6 F (37 C), temperature source Oral, resp. rate (!) 21, height 5\' 4"  (1.626 m), weight 56.5 kg, SpO2 98 %.        Intake/Output Summary (Last 24 hours) at 07/13/2018 2001 Last data filed at 07/13/2018 1002 Gross per 24 hour  Intake 102.89 ml  Output 1 ml  Net 101.89 ml   Filed Weights   07/12/18 1856 07/13/18 0442  Weight: 57 kg 56.5 kg    Examination: General: Elderly, nontoxic but ill-appearing 85 year old female no active distress except when examined and interviewed HENT: Neck is supple.  No JVD.  No icterus or nystagmus. Lungs: Clear to auscultation Cardiovascular: Regular rate and rhythm no audible murmur or gallops Abdomen: Nondistended, slightly firm, tenderness elicited in the lower mid abdomen and right upper quadrant.  No rebound however patient is uncooperative and difficult to ascertain.  Bowel sounds are hypoactive. Extremities: Moves all extremities.  Has a right radial arterial pressure device post cardiac cath Neuro: Glasgow Coma Scale is 3- 5-6, no focal deficits.  No cranial nerve deficits however exam limited by patient cooperativeness.  She is lethargic but is oriented to person place and time GU: No Foley catheter present.  Tenderness over the lower abdomen.   Assessment & Plan:  Seizure-new onset Bradycardia with frequent sinus pause Abdominal pain with concern for vagal reflex Bladder outlet obstruction N STEMI post stent to LAD Prolonged QT Paroxysmal atrial fibrillation with controlled rate on DOAC at home but not currently taking.  Discussion: This patient requires transfer to the ICU for a number of reasons.  She has had unstable heart rhythm with seizure which probably occurred  secondary to poor cerebral blood flow.  I do not sense that she will need Keppra however we will obtain a CT scan of the brain to rule out any focal lesions which may have led to this.  There does not appear to be any stroke pattern on examination.  Part of her mental status changes may also have to do with sedation that she received today and the paradoxical reactions to benzodiazepines. Watch for encephalopathy with the dose of Keppra given her age We will order CT scan of the abdomen and pelvis for pain which may be the etiology for her bradycardia. Monitor closely in the ICU Obtain urine with urinalysis Bladder scan and if full will need Foley catheter Check lactic acid, procalcitonin, BNP, mag, phosphorus, LFTs, amylase and lipase Change IV fluids to LR Restart heparin Neurochecks ordered as well as seizure precautions  Best practice:  Diet: N.p.o. for now Pain/Anxiety/Delirium protocol (if indicated): Follow-up as needed DVT prophylaxis: Will be on heparin and then DOAC GI prophylaxis: Not needed Glucose control: Follow closely Mobility: Will need to be assessed when stable Code Status: Full Family Communication: We will attempt to contact Disposition: Transfer to  critical care unit Labs   CBC: Recent Labs  Lab 07/13/18 0811  WBC 5.7  HGB 10.7*  HCT 32.0*  MCV 89.9  PLT 122*    Basic Metabolic Panel: Recent Labs  Lab 07/13/18 0811  NA 132*  K 3.5  CL 99  CO2 21*  GLUCOSE 138*  BUN 7*  CREATININE 0.65  CALCIUM 8.7*   GFR: Estimated Creatinine Clearance: 46 mL/min (by C-G formula based on SCr of 0.65 mg/dL). Recent Labs  Lab 07/13/18 0811  WBC 5.7    Liver Function Tests: Recent Labs  Lab 07/13/18 0811  AST 92*  ALT 38  ALKPHOS 58  BILITOT 0.9  PROT 6.4*  ALBUMIN 3.4*   No results for input(s): LIPASE, AMYLASE in the last 168 hours. No results for input(s): AMMONIA in the last 168 hours.  ABG No results found for: PHART, PCO2ART, PO2ART, HCO3,  TCO2, ACIDBASEDEF, O2SAT   Coagulation Profile: No results for input(s): INR, PROTIME in the last 168 hours.  Cardiac Enzymes: Recent Labs  Lab 07/12/18 1959 07/13/18 0234 07/13/18 0811  TROPONINI 4.12* 7.48* 7.17*    HbA1C: Hgb A1c MFr Bld  Date/Time Value Ref Range Status  07/13/2018 02:34 AM 6.5 (H) 4.8 - 5.6 % Final    Comment:    (NOTE) Pre diabetes:          5.7%-6.4% Diabetes:              >6.4% Glycemic control for   <7.0% adults with diabetes     CBG: Recent Labs  Lab 07/12/18 2026 07/13/18 0737 07/13/18 1416  GLUCAP 122* 154* 135*      Allergies Allergies  Allergen Reactions  . Codeine Other (See Comments)    Unknown reaction  . Penicillins Other (See Comments)    Unknown reaction Did it involve swelling of the face/tongue/throat, SOB, or low BP? Unknown Did it involve sudden or severe rash/hives, skin peeling, or any reaction on the inside of your mouth or nose? Unknown Did you need to seek medical attention at a hospital or doctor's office? Unknown When did it last happen?unknown (long time ago) If all above answers are "NO", may proceed with cephalosporin use.    Personally reviewed home and current medications  Home Medications  Prior to Admission medications   Medication Sig Start Date End Date Taking? Authorizing Provider  colesevelam (WELCHOL) 625 MG tablet Take 1,875 mg by mouth 2 (two) times daily with a meal.    Yes [provider]  diltiazem (CARDIZEM CD) 180 MG 24 hr capsule Take 180 mg by mouth daily.   Yes [provider]  ergocalciferol (VITAMIN D2) 50000 units capsule Take 50,000 Units by mouth every Wednesday.    Yes [provider]  ezetimibe (ZETIA) 10 MG tablet Take 10 mg by mouth at bedtime.    Yes [provider]  furosemide (LASIX) 20 MG tablet Take 20 mg by mouth daily as needed for fluid or edema.    Yes [provider]  Glucosamine-Chondroit-Vit C-Mn (GLUCOSAMINE 1500  COMPLEX PO) Take 1 capsule by mouth daily at 12 noon.    Yes [provider]  levothyroxine (SYNTHROID, LEVOTHROID) 75 MCG tablet Take 75 mcg by mouth daily before breakfast.    Yes [provider]  lisinopril (PRINIVIL,ZESTRIL) 40 MG tablet Take 40 mg by mouth at bedtime.    Yes [provider]  memantine (NAMENDA) 10 MG tablet Take 10 mg by mouth 2 (two) times daily.  Yes [provider]  metoprolol succinate (TOPROL-XL) 50 MG 24 hr tablet Take 100 mg by mouth daily. Take with or immediately following a meal.   Yes [provider]  montelukast (SINGULAIR) 10 MG tablet Take 10 mg by mouth at bedtime.   Yes [provider]  nitroGLYCERIN (NITROSTAT) 0.4 MG SL tablet Place 0.4 mg under the tongue every 5 (five) minutes as needed for chest pain.    Yes [provider]  Prenatal Vit-Fe Fumarate-FA (PRENATAL VITAMIN PO) Take 1 tablet by mouth daily at 12 noon.   Yes [provider]  Probiotic Product (PROBIOTIC PO) Take 1 tablet by mouth daily.   Yes [provider]  rivaroxaban (XARELTO) 20 MG TABS tablet Take 1 tablet (20 mg total) by mouth daily with supper. 04/30/18  Yes Revankar, Aundra Dubin, MD  glucose blood test strip 1 each by Other route as needed. Use as instructed    [provider]     Critical care time: 71      This patient  is critically ill with concern for decompensation and requires high complexity decision making for assessment and support, frequent evaluation and titration of therapies, application of advanced monitoring technologies and extensive interpretation of multiple databases.    Critical Care Time devoted to patient care services described in this note is 70 minutes minutes. This time reflects time of care of this author, Dr. Shaune Pascal, DO FCCP.  This critical care time does not reflect procedure time, or teaching time or supervisory time of PA/NP/Med student/Med Resident etc but  could involve care discussion time         Shaune Pascal, DO [facility] Intenisivist, Critical Care Medicine [facility] Pulmonary and Critical Care [phone]

## 2018-07-13 NOTE — Care Plan (Addendum)
Called to bedside for acute change in mentation as well as reported sinus pause. Patient because acutely more confused and disoriented per nursing.  She then passed out and was noted to have sinus pause of around 5 seconds on telemetry. This is the second time this occurred ( 1st at 4:30 pm- sinus pause of around 6 seconds)  Seems consistent with vasovagal given IBS history with worsening diarrhea and complaint of abdominal pain. Her dilitiazem and toprol were discontinued earlier today but will take time to get out of her system. Discussed care with overnight Cardiology attending who agrees this is likely related to vasovagal and to control intrabdominal process.  At 6:54 patient again went into symptomatic sinus pause. At bedside I witnessed rigid extension of extremities, head turned, teeth clinched, unresponsive for several seconds(? 20s) with some tonic clonic jerking. She was found to be incontinent of stool and urine. She came to without medications or pacing required.   Neurology was Stat paged- bedside evaluation seemed to be consistent with postictal assessment-. Recommended Keppra load ( 1500mg , and give IV ativan if seizure recurs before loaded).   to neurology given concern for acute seizure, needs higher level of care due to concern for inability to protect airway  PE On 4 L with O2 saturation of 100%. BP 159/74.  Not complaining of abdominal pain actively or chest pain. Only saying "stop, leave me alone" Alert, but not answering orientation questions Moving all extremities ( improved from prior exam which noted left upper extremity weakness and inability to follow commands) no slurred speech, no facial droop Seems to have left sided vision defect on neuro's exam ( of note patient has history of stroke per son 6-8 months ago, in [facility])  Plan Seizure activity -Keppra load,  -stat CMP, CBC, ua, UDS ordered -needs CT head, holding off currently given persistent  symptomatic sinus pauses - transfer to ICU for monitoring (discussed with on call critical care attending), currently alert and agitated but need closer monitoring in case of inability to protect airway  Sinus Pauses. Presumed related to suspected vasovagal activity given syncope x2 prior to admission in setting of IBS and reported worsening diarrhea and abdominal pain -abd xr Stat, pain control  -strict bed rest - avoid av nodal blocking agents (home toprol and diltiazem discontinued) - pacer pads in place  NSTEMI Underwent stent ( see more in today's progress note) -on heparin with plan to transition to home xarelto and clopidogrel  Son Elaine Mathews, [phone] updated over phone about change in clinical status and plan to transfer to higher level of care due to concern she may need intubation and much closer monitoring    Elaine Mathews [facility]

## 2018-07-13 NOTE — Interval H&P Note (Signed)
History and Physical Interval Note:  07/13/2018 12:05 PM  Elaine Mathews  has presented today for cardiac catheterization, with the diagnosis of NSTEMI.  The various methods of treatment have been discussed with the patient and family. After consideration of risks, benefits and other options for treatment, the patient has consented to  Procedure(s): LEFT HEART CATH AND CORONARY ANGIOGRAPHY (N/A) as a surgical intervention.  The patient's history has been reviewed, patient examined, no change in status, stable for surgery.  I have reviewed the patient's chart and labs.  Questions were answered to the patient's satisfaction.    Cath Lab Visit (complete for each Cath Lab visit)  Clinical Evaluation Leading to the Procedure:   ACS: Yes.    Non-ACS:  N/A  Elaine Mathews

## 2018-07-13 NOTE — Progress Notes (Signed)
ANTICOAGULATION CONSULT NOTE - Follow Up Consult  Pharmacy Consult for heparin Indication: chest pain/ACS and atrial fibrillation  Labs: Recent Labs    07/12/18 1959 07/13/18 0234  APTT  --  127*  HEPARINUNFRC  --  0.42  TROPONINI 4.12*  --     Assessment/Plan:  85yo female therapeutic on heparin with initial dosing while Xarelto on hold. Will continue gtt at current rate and confirm stable with additional level.   Vernard Gambles, PharmD, BCPS  07/13/2018,4:05 AM

## 2018-07-13 NOTE — Progress Notes (Signed)
Spoke with Dr Clarisa Kindred re: elevated B/P, and elevated Troponin now 4.12, no chest pain at this time, he wanted an EKG done and will come to see her.

## 2018-07-13 NOTE — Consult Note (Signed)
Neurology Consultation Reason for Consult: Seizures Referring Physician: Dennison Nancy  CC: Seizures  History is obtained from: Referring physician  HPI: Elaine Mathews is a 85 y.o. female admitted with recurrent syncope found to be having frequent long pauses.  Of note, she was rather confused earlier today.  Tonight, she had another long sinus pause and subsequently had tonic-clonic activity with left head turn and bilateral arm extension.  This lasted for approximately 20 seconds, and falling and she has been quite postictal but is gradually improving.   ROS:  Unable to obtain due to altered mental status.   Past Medical History:  Diagnosis Date  . Allergic rhinosinusitis   . Arthritis   . Cataract, bilateral   . Diabetes (HCC)   . Diabetic nephropathy (HCC)   . Elevated liver enzymes   . Essential hypertension   . Fatty liver   . Foot lesion   . Full dentures   . Hyperlipidemia   . Hypothyroidism   . Leg pain, bilateral   . Mild asthma   . Mild asthma   . PVD (peripheral vascular disease) (HCC)   . Stroke (HCC)   . Vitamin D deficiency      Family History  Problem Relation Age of Onset  . Hypertension Mother   . Stroke Mother   . Heart attack Mother   . Hypercholesterolemia Mother   . Hypertension Father   . Stroke Father   . Heart attack Son   . COPD Son      Social History:  reports that she has quit smoking. She has never used smokeless tobacco. She reports that she does not drink alcohol or use drugs.   Exam: Current vital signs: BP (!) 143/65   Pulse 72   Temp 98.6 F (37 C) (Oral)   Resp (!) 21   Ht 5\' 4"  (1.626 m)   Wt 56.5 kg   SpO2 98%   BMI 21.39 kg/m  Vital signs in last 24 hours: Temp:  [97.3 F (36.3 C)-98.6 F (37 C)] 98.6 F (37 C) (06/01 0738) Pulse Rate:  [55-96] 72 (06/01 1616) Resp:  [13-23] 21 (06/01 1335) BP: (113-197)/(51-113) 143/65 (06/01 1616) SpO2:  [90 %-100 %] 98 % (06/01 1616) Weight:  [56.5 kg] 56.5 kg (06/01  0442)   Physical Exam  Constitutional: Appears well-developed and well-nourished.  Psych: Affect appropriate to situation Eyes: No scleral injection HENT: No OP obstrucion Head: Normocephalic.  Cardiovascular: Normal rate and regular rhythm.  Respiratory: Effort normal, non-labored breathing GI: Soft.  No distension. There is no tenderness.  Skin: WDI  Neuro: Mental Status: Patient is somnolent but does arouse, she is very agitated telling me "I am sick leave me alone."  She does tell me her name and follows some simple commands. Cranial Nerves: II:  Pupils are equal, round, and reactive to light.   III,IV, VI: EOMI without ptosis or diploplia.  V: Facial sensation is symmetric to temperature VII: Facial movement is symmetric.  VIII: hearing is intact to voice X: Uvula elevates symmetrically XI: Shoulder shrug is symmetric. XII: tongue is midline without atrophy or fasciculations.  Motor: She moves all extremities spontaneously and to stimulation sensory: Patient responds to mild stimulation in all 4 extremities Cerebellar: Does not perform   I have reviewed labs in epic and the results pertinent to this consultation are: CMP- mild hyponatremia  I have reviewed the images obtained: CT head- right posterior parietal infarct  Impression: 85 year old female with recurrent syncope and  witnessed seizure.  Though it is possible that the sinus pause led to hypoperfusion leading to seizure, but she is at risk for seizure in the partial semiology(head turn).  I would favor starting her on antiepileptics.   Seizure leading to bradycardia is also possible, but the correlation is unclear at this time.  Recommendations: 1) Keppra 1500mg  x 1, then 500mg  BID 2) EEG 3) CT head 4) neurology will continue to follow  Ritta Slot, MD [facility] [phone]  If 7pm- 7am, please page neurology on call as listed in AMION.

## 2018-07-13 NOTE — Progress Notes (Signed)
Pt c/o discomfort under left breast stating "having some excitement in there."  Increased nitro to 15 mcg/min.  No changes noted from previous EKG. Su Hilt, NP made aware.  Hinton Dyer, RN

## 2018-07-13 NOTE — Brief Op Note (Signed)
BRIEF CARDIAC CATHETERIZATION NOTE  07/13/2018  1:45 PM  PATIENT:  Elaine Mathews  85 y.o. female  PRE-OPERATIVE DIAGNOSIS:  NSTEMI  POST-OPERATIVE DIAGNOSIS: NSTEMI  PROCEDURE:  Procedure(s): LEFT HEART CATH AND CORONARY ANGIOGRAPHY (N/A) CORONARY STENT INTERVENTION (N/A) INTRAVASCULAR PRESSURE WIRE/FFR STUDY (N/A)  SURGEON:  Surgeon(s) and Role:    * Ly Wass, Cristal Deer, MD - Primary  FINDINGS: 1. Two vessel coronary artery disease with multifocal LAD (40% proximal, 70% mid, and 80 apical).  Proximal and mid LAD disease is significant by DFR (0.74).  Culpril lesion for NSTEMI is likely occluded small OM branch, which is too small for PCI. 2. Normal left ventricular systolic function with mildly elevated LVEDP (15-20 mmHg). 3. Successful DFR-guided PCI to the proximal/mid LAD using a Resolute Onyx 2.75 x 18 mm drug-eluting stent with 0% residual stenosis and TIMI-3 flow.  RECOMMENDATIONS: 1. Medical therapy for occluded OM branch and apical LAD disease. 2. Restart heparin 2 hours after TR band removal.  If no bleeding complications, recommend discontinuation of aspirin tomorrow and initiation of rivaroxaban 20 mg daily.  Rivaroxaban and clopidogrel should be continued for at least 12 months, if possible. 3. Aggressive secondary prevention.  Yvonne Kendall, MD [facility] Pager: [phone]

## 2018-07-13 NOTE — Progress Notes (Addendum)
[facility] Post-PCI Note  Date: 07/13/18 Time: 4:14 PM  Subjective: Patient notes headache and left-sided abdominal pain.  Abdominal pain began this afternoon following PCI.  HA has been present since NTG infusion was increased shortly before going for cath earlier today.  The patient's RN was concerned about shuffling gait and difficulty following instructions to use walker when going to the bathroom following cath.  Patient has h/o two strokes and notes some balance/strength issues at baseline but is unable to describe these further.  After my initial assessment, patient was noted to have brief loss of consciousness while RN was performing bedside swallow evaluation.  Objective: Temp:  [97.3 F (36.3 C)-98.8 F (37.1 C)] 98.6 F (37 C) (06/01 0738) Pulse Rate:  [55-96] 69 (06/01 1335) Resp:  [13-23] 21 (06/01 1335) BP: (113-197)/(51-113) 133/71 (06/01 1600) SpO2:  [90 %-100 %] 97 % (06/01 1600) Weight:  [56.5 kg-57 kg] 56.5 kg (06/01 0442) Gen: Elderly woman, lying comfortably in bed. Heart: RRR w/o murmurs. Abd: Soft with mild diffuse tenderness.  No rebound or guarding. Neuro: CN III-IX intact.  4+/5 LUE strength (able to move right arm but strength not assessed due to radial arteriotomy and TR band in place).  LE strength 5/5 on the right and 4+/5 on the left.  She is A&O x 3.  Telemetry: NSR with ~25 second episode of sinus arrest with junctional escape rhythm.  Assessment/plan: Patient is s/p PCI to mid LAD.  It is unclear if mild neuro changes are acute or chronic.  Sedation given during the procedure may also be affecting her mental status.  Given headache and history of fall several days ago, I think it would be reasonable to image her head to exclude intracranial hemorrhage.  Patient was discussed with Dr. Caleb Popp, who will arrange for STAT head CT.  We will also wean down NTG infusion.  I have discussed recurrent syncopal episode with Dr. Graciela Husbands (EP); he will review telemetry.   I recommend holding diltiazem for now.  Addendum (07/13/18@4 :51 PM) Telemetry reviewed by Dr. Graciela Husbands.  He agrees that transient bradycardia is consistent with vasovagal mechanism.  As above, I recommend discontinuation of diltiazem and workup of abdominal pain and neurologic concerns per the hospitalist team.  No further EP intervention recommended at this time.  Yvonne Kendall, MD [facility] Pager: [phone]

## 2018-07-13 NOTE — Progress Notes (Addendum)
 Progress Note  Patient Name: Elaine Mathews Date of Encounter: 07/13/2018  Primary Cardiologist: No primary care provider on file.   Subjective   No complaints this morning.   Inpatient Medications    Scheduled Meds: . aspirin EC  81 mg Oral Daily  . atorvastatin  40 mg Oral q1800  . diltiazem  180 mg Oral Daily  . insulin aspart  0-9 Units Subcutaneous Q4H  . levothyroxine  75 mcg Oral Q0600  . lisinopril  40 mg Oral QHS  . metoprolol succinate  100 mg Oral Daily   Continuous Infusions: . heparin 900 Units/hr (07/13/18 0600)  . nitroGLYCERIN    . nitroGLYCERIN 15 mcg/min (07/13/18 0817)   PRN Meds: acetaminophen, labetalol, ondansetron (ZOFRAN) IV   Vital Signs    Vitals:   07/13/18 0353 07/13/18 0407 07/13/18 0442 07/13/18 0738  BP: (!) 126/51 (!) 143/68 (!) 142/65 (!) 138/52  Pulse: 78 77 86 76  Resp:   18 18  Temp:   98.2 F (36.8 C) 98.6 F (37 C)  TempSrc:   Oral Oral  SpO2: 93% 93% 95% 99%  Weight:   56.5 kg   Height:        Intake/Output Summary (Last 24 hours) at 07/13/2018 0845 Last data filed at 07/13/2018 0600 Gross per 24 hour  Intake 102.89 ml  Output -  Net 102.89 ml   Last 3 Weights 07/13/2018 07/12/2018 05/18/2018  Weight (lbs) 124 lb 9.6 oz 125 lb 11.2 oz 126 lb 3.2 oz  Weight (kg) 56.518 kg 57.017 kg 57.244 kg      Telemetry    SR - Personally Reviewed  ECG    SR with RBBB TWI in septal leads, prolonged Qt interval - Personally Reviewed  Physical Exam   GEN: No acute distress.   Neck: No JVD Cardiac: RRR, no murmurs, rubs, or gallops.  Respiratory: Clear to auscultation bilaterally. GI: Soft, nontender, non-distended  MS: No edema; No deformity. Neuro:  Nonfocal  Psych: Normal affect   Labs    ChemistryNo results for input(s): NA, K, CL, CO2, GLUCOSE, BUN, CREATININE, CALCIUM, PROT, ALBUMIN, AST, ALT, ALKPHOS, BILITOT, GFRNONAA, GFRAA, ANIONGAP in the last 168 hours.   Hematology Recent Labs  Lab 07/13/18 0811  WBC 5.7   RBC 3.56*  HGB 10.7*  HCT 32.0*  MCV 89.9  MCH 30.1  MCHC 33.4  RDW 13.7  PLT 122*    Cardiac Enzymes Recent Labs  Lab 07/12/18 1959 07/13/18 0234  TROPONINI 4.12* 7.48*   No results for input(s): TROPIPOC in the last 168 hours.   BNPNo results for input(s): BNP, PROBNP in the last 168 hours.   DDimer No results for input(s): DDIMER in the last 168 hours.   Radiology    No results found.  Cardiac Studies   N/a   Patient Profile     85 y.o. female with a past medical history significant for paroxysmal atrial fibrillation (supposed to be on Xarelto), prior CVA, uncontrolled hypertension, diabetes mellitus, hyperlipidemia, hypothyroidism, and vitamin D deficiency who presented to the hospital after a syncopal episode.  Assessment & Plan    1. NSTEMI in the setting of near syncope: Denies any chest pain. Reports feeling lightheaded and sliding down the wall into the floor on Sunday morning with great grandson calling EMS. Troponin currently 7.48. EKG without acute ischemia. Does have concerning RFs for ACS. Would favor further ischemic work up with cardiac cath to define coronary anatomy. If no CAD noted would   need outpatient monitoring for arrhythmia. -- The patient understands that risks included but are not limited to stroke (1 in 1000), death (1 in 1000), kidney failure [usually temporary] (1 in 500), bleeding (1 in 200), allergic reaction [possibly serious] (1 in 200).  -- remains on IV heparin -- echo pending  2. HTN: Reports she stopped taking her medications about a week ago because she felt they were not helping. Bp was significantly elevated at [place]. Now improved with resumption of meds. Currently on BB, CCB and nitro gtt. Attempt to wean nitro post cath.   3. PAF: should have been on Xarelto prior to admission but stopped taking on 07/07/18 along with all other meds. Currently in SR -- on IV heparin  4. HL: LDL noted at 121, on high dose statin added on  admission.   5. DM: does not appear to be on home meds for this.  -- SSI while inpatient -- Hgb A1c 6.5, she thinks this may be diet controlled  6. Prolonged QT: noted on admission, improved on this morning EKG  For questions or updates, please contact [facility] Please consult [email or web address] for contact info under        Signed, Laverda Page, NP  07/13/2018, 8:45 AM    I have personally seen and examined this patient. I agree with the assessment and plan as outlined above.  She had mild CP this am. Troponin elevated c/w NSTEMI. Will need cardiac cath later today. If PCI is needed would use Plavix as she is on Xarelto as an outpatient for PAF. I personally reviewed the risks of the cath procedure and she agrees to proceed. Labs pending this am.   Verne Carrow 07/13/2018 9:10 AM

## 2018-07-13 NOTE — Progress Notes (Signed)
RN called for pt with tremors and needing to be transferred for a head CT that was ordered at 1627. She reports pt had Left arm weakness and a "shuffling gate" upon returning from bathroom around 1600. MD notified, Dr. Caleb Popp and Dr. Okey Dupre to bedside to evaluate pt. Pt had approx 6 sceond pause on my arrival, I did not witness the event during this pause as it had passed before entering room. RN reports that was the second pause today. They wanted pt to go to CT after placing Zoll pads on pt. I was not comfortable transferring pt to CT with two separate events of 6 second pauses. RN and MD made aware. MD at bedside contacting cardiology. Pt not following commands but would lift extremities to painful stimuli. EKG completed. I had to leave pt with MD at bedside to see another critical pt at 1837. RN and MD aware.

## 2018-07-14 ENCOUNTER — Encounter (HOSPITAL_COMMUNITY): Payer: Self-pay | Admitting: Internal Medicine

## 2018-07-14 ENCOUNTER — Inpatient Hospital Stay (HOSPITAL_COMMUNITY): Payer: Medicare Other

## 2018-07-14 DIAGNOSIS — R1012 Left upper quadrant pain: Secondary | ICD-10-CM

## 2018-07-14 DIAGNOSIS — R001 Bradycardia, unspecified: Secondary | ICD-10-CM

## 2018-07-14 DIAGNOSIS — E1140 Type 2 diabetes mellitus with diabetic neuropathy, unspecified: Secondary | ICD-10-CM

## 2018-07-14 DIAGNOSIS — S36029S Unspecified contusion of spleen, sequela: Secondary | ICD-10-CM

## 2018-07-14 DIAGNOSIS — I10 Essential (primary) hypertension: Secondary | ICD-10-CM

## 2018-07-14 DIAGNOSIS — K589 Irritable bowel syndrome without diarrhea: Secondary | ICD-10-CM

## 2018-07-14 LAB — BASIC METABOLIC PANEL WITH GFR
Anion gap: 11 (ref 5–15)
BUN: 7 mg/dL — ABNORMAL LOW (ref 8–23)
CO2: 23 mmol/L (ref 22–32)
Calcium: 8.7 mg/dL — ABNORMAL LOW (ref 8.9–10.3)
Chloride: 101 mmol/L (ref 98–111)
Creatinine, Ser: 0.72 mg/dL (ref 0.44–1.00)
GFR calc Af Amer: >60 mL/min
GFR calc non Af Amer: >60 mL/min
Glucose, Bld: 113 mg/dL — ABNORMAL HIGH (ref 70–99)
Potassium: 3.2 mmol/L — ABNORMAL LOW (ref 3.5–5.1)
Sodium: 135 mmol/L (ref 135–145)

## 2018-07-14 LAB — PROTIME-INR
INR: 1.1 (ref 0.8–1.2)
Prothrombin Time: 14.5 seconds (ref 11.4–15.2)

## 2018-07-14 LAB — URINALYSIS, ROUTINE W REFLEX MICROSCOPIC
Bilirubin Urine: NEGATIVE
Glucose, UA: NEGATIVE mg/dL
Hgb urine dipstick: NEGATIVE
Ketones, ur: NEGATIVE mg/dL
Leukocytes,Ua: NEGATIVE
Nitrite: NEGATIVE
Protein, ur: NEGATIVE mg/dL
Specific Gravity, Urine: >1.046 — ABNORMAL HIGH (ref 1.005–1.030)
pH: 5 (ref 5.0–8.0)

## 2018-07-14 LAB — CBC WITH DIFFERENTIAL/PLATELET
Abs Immature Granulocytes: 0.02 10*3/uL (ref 0.00–0.07)
Basophils Absolute: 0 10*3/uL (ref 0.0–0.1)
Basophils Relative: 0 %
Eosinophils Absolute: 0.1 10*3/uL (ref 0.0–0.5)
Eosinophils Relative: 1 %
HCT: 31.8 % — ABNORMAL LOW (ref 36.0–46.0)
Hemoglobin: 10.6 g/dL — ABNORMAL LOW (ref 12.0–15.0)
Immature Granulocytes: 0 %
Lymphocytes Relative: 21 %
Lymphs Abs: 1.4 10*3/uL (ref 0.7–4.0)
MCH: 29.9 pg (ref 26.0–34.0)
MCHC: 33.3 g/dL (ref 30.0–36.0)
MCV: 89.8 fL (ref 80.0–100.0)
Monocytes Absolute: 0.5 10*3/uL (ref 0.1–1.0)
Monocytes Relative: 8 %
Neutro Abs: 4.4 10*3/uL (ref 1.7–7.7)
Neutrophils Relative %: 70 %
Platelets: 121 10*3/uL — ABNORMAL LOW (ref 150–400)
RBC: 3.54 MIL/uL — ABNORMAL LOW (ref 3.87–5.11)
RDW: 13.6 % (ref 11.5–15.5)
WBC: 6.4 10*3/uL (ref 4.0–10.5)
nRBC: 0 % (ref 0.0–0.2)

## 2018-07-14 LAB — GLUCOSE, CAPILLARY
Glucose-Capillary: 108 mg/dL — ABNORMAL HIGH (ref 70–99)
Glucose-Capillary: 109 mg/dL — ABNORMAL HIGH (ref 70–99)
Glucose-Capillary: 121 mg/dL — ABNORMAL HIGH (ref 70–99)
Glucose-Capillary: 123 mg/dL — ABNORMAL HIGH (ref 70–99)
Glucose-Capillary: 145 mg/dL — ABNORMAL HIGH (ref 70–99)
Glucose-Capillary: 81 mg/dL (ref 70–99)
Glucose-Capillary: 95 mg/dL (ref 70–99)

## 2018-07-14 LAB — PHOSPHORUS: Phosphorus: 3.5 mg/dL (ref 2.5–4.6)

## 2018-07-14 LAB — MAGNESIUM: Magnesium: 2.5 mg/dL — ABNORMAL HIGH (ref 1.7–2.4)

## 2018-07-14 LAB — APTT: aPTT: 61 seconds — ABNORMAL HIGH (ref 24–36)

## 2018-07-14 LAB — URINE CULTURE: Culture: NO GROWTH

## 2018-07-14 LAB — LACTIC ACID, PLASMA: Lactic Acid, Venous: 1.1 mmol/L (ref 0.5–1.9)

## 2018-07-14 LAB — MRSA PCR SCREENING: MRSA by PCR: NEGATIVE

## 2018-07-14 MED ORDER — RIVAROXABAN 20 MG PO TABS
20.0000 mg | ORAL_TABLET | Freq: Every day | ORAL | Status: DC
  Administered 2018-07-15 – 2018-07-16 (×2): 20 mg via ORAL
  Filled 2018-07-14 (×2): qty 1

## 2018-07-14 MED ORDER — POTASSIUM CHLORIDE 10 MEQ/100ML IV SOLN
10.0000 meq | INTRAVENOUS | Status: AC
  Administered 2018-07-14 (×3): 10 meq via INTRAVENOUS
  Filled 2018-07-14 (×3): qty 100

## 2018-07-14 MED ORDER — CHLORHEXIDINE GLUCONATE CLOTH 2 % EX PADS
6.0000 | MEDICATED_PAD | Freq: Every day | CUTANEOUS | Status: DC
  Administered 2018-07-14: 6 via TOPICAL

## 2018-07-14 MED ORDER — IOHEXOL 300 MG/ML  SOLN
100.0000 mL | Freq: Once | INTRAMUSCULAR | Status: AC | PRN
  Administered 2018-07-14: 100 mL via INTRAVENOUS

## 2018-07-14 MED ORDER — RIVAROXABAN 20 MG PO TABS
20.0000 mg | ORAL_TABLET | Freq: Every day | ORAL | Status: DC
  Administered 2018-07-14: 20 mg via ORAL
  Filled 2018-07-14: qty 1

## 2018-07-14 MED ORDER — HEPARIN (PORCINE) 25000 UT/250ML-% IV SOLN
1000.0000 [IU]/h | INTRAVENOUS | Status: DC
  Administered 2018-07-14: 1000 [IU]/h via INTRAVENOUS
  Filled 2018-07-14: qty 250

## 2018-07-14 MED ORDER — LOPERAMIDE HCL 2 MG PO CAPS
4.0000 mg | ORAL_CAPSULE | Freq: Once | ORAL | Status: AC
  Administered 2018-07-14: 4 mg via ORAL
  Filled 2018-07-14: qty 2

## 2018-07-14 MED ORDER — FENTANYL CITRATE (PF) 100 MCG/2ML IJ SOLN
25.0000 ug | Freq: Once | INTRAMUSCULAR | Status: AC
  Administered 2018-07-14: 25 ug via INTRAVENOUS
  Filled 2018-07-14: qty 2

## 2018-07-14 NOTE — Progress Notes (Addendum)
CARDIAC REHAB PHASE I   PRE:  Rate/Rhythm: 90 SR    BP: sitting 125/78    SaO2: 96 RA  MODE:  Ambulation: pivot to recliner then to [facility]   POST:  Rate/Rhythm: 96 SR    BP: sitting 146/57     SaO2: 95 RA  Pt feeling much better today. C/o slight HA. Willing to get to recliner this pm. Weak, needed assist x2 for unsteadiness and lines. No CP or dizziness or others sx noted with movement. VSS, appropriate. She did need to use BSC for BM after settled in recliner. Therefore transferred her to [facility]. She is quite talkative and wanting to be aware. Closes eyes when talking. She asked for water to clean her mouth but then unaware and turned the water on herself x2 while cleaning her mouth. Also held her left arm up in air for unknown  reason for several minutes when we were cleaning her up. We left her on [facility] with RN as she felt like she still needed to have more BM. Will f/u tomorrow for ambulation. She apparently lives with her grandson who is unreliable. At this point she should not be by herself at home. Suggest PT c/s to help assess home care. [phone]  Harriet Masson CES, ACSM 07/14/2018 2:50 PM

## 2018-07-14 NOTE — Progress Notes (Addendum)
NAME:  Elaine Mathews, MRN:  [id number], DOB:  11/03/1933, LOS: 2 ADMISSION DATE:  07/12/2018, CONSULTATION DATE: 07/13/2018 REFERRING MD:  Dr. Roberto Scales, CHIEF COMPLAINT: Seizure, abdominal pain, bradycardia   Brief History   85 year old female admitted with recurrent syncope and poorly controlled hypertension.  During hospitalization, Cardiology seeing for NSTEMI, underwent LHC 6/1 with stenting to the LAD.  6/1 afternoon, had additional syncope with concurrent bradycardia and sinus pauses with one episode of witnessed seizure thought to be related to hypoperfusion.  Transferred to ICU.  Neurology consulted and CT done, and empirically started on antiepileptics. PCCM consulted given higher level of care.   Past Medical History  PAF on xarelto, prior stroke, HTN, reported DMT2, hypothyroid, HLD, vitamin D deficiency   Significant Hospital Events   5/31 Admit TRH 6/1 LHC/ syncopal/ bradycardic/ long pauses with 1 witnessed seizure during.  Tx to ICU   Consults:  Critical care Cardiology Neurology   Procedures:  6/1 LHC   Significant Diagnostic Tests:  6/1 LHC >> 1. Two vessel coronary artery disease with multifocal LAD (40% proximal, 60-70% mid, and 80% apical stenoses). Proximal and mid LAD disease is highly significant by DFR (0.74). Culpril lesion for NSTEMI is likely occluded small OM2 branch, which is too small for PCI. 2. Normal left ventricular systolic function with mildly elevated filling pressure (LVEDP 15-20 mmHg). 3. Successful DFR-guided PCI to the proximal/mid LAD using a Resolute Onyx 2.75 x 18 mm drug-eluting stent with 0% residual stenosis and TIMI-3 flow.  6/2 CTH >> 1. No acute intracranial abnormality. 2. Chronic small vessel ischemia and old right parietal infarct.  6/2 CT abd/ pelvis >> 1. Probable focal area of traumatic splenic injury/laceration with a small perisplenic or subcapsular hematoma. No extravasation of contrast or evidence of active bleed.  2. Sigmoid diverticulosis. No bowel obstruction or active inflammation.  Micro Data:  6/1 UC >>  Antimicrobials:  None  Subjective:  CT abd/ pelvis showing probable focal area of traumatic splenic injury/ lac with small hematoma- no active bleed; Hgb trend stable   Remained hemodynamically stable and afebrile overnight.  No further bradycardia/ pauses.   Patient currently alert/ oriented.  C/o mild HA and some neck pain.  No SOB/ CP, "soreness" in her abd.   Objective   Blood pressure (!) 139/49, pulse 67, temperature 98.2 F (36.8 C), temperature source Oral, resp. rate 15, height  (1.626 m), weight 56.5 kg, SpO2 100 %.     Intake/Output Summary (Last 24 hours) at 07/14/2018 0850 Last data filed at 07/14/2018 0800 Gross per 24 hour  Intake 568.51 ml  Output 351 ml  Net 217.51 ml   Filed Weights   07/12/18 1856 07/13/18 0442  Weight: 57 kg 56.5 kg   Examination: General:  Elderly female lying in bed in NAD HEENT: MM pink/moist, pupils 4/reactive, minor pain to cervical palpation, no stepoffs, patient turning her head without difficulting Neuro:  Alert/ oriented x 3, MAE CV: SR, no mumur PULM: even/non-labored, lungs bilaterally clear WU:JWJX, mildly tender- diffusely, no guarding, bs active, purwick Extremities: warm/dry, no LE edema, some chronic venous stasis changes Skin: no rashes   Assessment & Plan:   Seizure-new onset, occurring in the hypoperfused state P:  Neurology following, with further AED recs per them.  Ongoing neuro exams but is back to her baseline.  Would assess bedside swallow screen prior to resuming NPOs    Bradycardia with frequent sinus pause- resolved NSTEMI post stent to LAD  6/1 Prolonged QT Paroxysmal atrial fibrillation with controlled rate on DOAC at home but not currently taking. P:  Cardiology following - plans to continue plavix, stop ASA/ restart Xarelto; holding metoprolol and cardizem, continue tele monitoring, amy need EP  consult if ongoing pauses   Hypokalemia - stable sCr  P:  KCL x 3 runs while NPO   Possible focal area of traumatic splenic injury/ lac with small hematoma  P:  Serial abd/ exams; hemoglobin stable this am, monitor while restarting xarelto this pm   Bladder outlet obstruction P:  Able to void this am w/ purwick    Neck pain P:  consider further imaging given her hx of syncopal episodes    Remains hemodynamically stable.  Wean FiO2 for sats goal > 92, currently on 2L.  Remainder per primary.   Can be moved out of ICU today from PCCM standpoint.  Nothing further to add.  PCCM will sign off.  Please do not hesitate to call us back if we can be of any further assistance.   Best practice:  Diet: N.p.o. for now Pain/Anxiety/Delirium protocol (if indicated): n/a DVT prophylaxis:  Restarting xarelto this PM GI prophylaxis: n/a Glucose control: trend on BMP Mobility: per primary  Code Status: Full Family Communication: patient updated on plan of care.  Disposition:    Labs   CBC: Recent Labs  Lab 07/13/18 0811 07/13/18 2048 07/14/18 0746  WBC 5.7 7.7 6.4  NEUTROABS  --  6.1 4.4  HGB 10.7* 11.3* 10.6*  HCT 32.0* 33.9* 31.8*  MCV 89.9 90.2 89.8  PLT 122* 121* 121*    Basic Metabolic Panel: Recent Labs  Lab 07/13/18 0811 07/13/18 2048 07/14/18 0746  NA 132* 133* 135  K 3.5 3.6 3.2*  CL 99 99 101  CO2 21* 24 23  GLUCOSE 138* 150* 113*  BUN 7* 7* 7*  CREATININE 0.65 0.56 0.72  CALCIUM 8.7* 8.9 8.7*  MG  --  1.6* 2.5*  PHOS  --  3.7 3.5   GFR: Estimated Creatinine Clearance: 46 mL/min (by C-G formula based on SCr of 0.72 mg/dL). Recent Labs  Lab 07/13/18 0811 07/13/18 2048 07/14/18 0746  PROCALCITON  --  <0.10  --   WBC 5.7 7.7 6.4  LATICACIDVEN  --  1.2 1.1    Liver Function Tests: Recent Labs  Lab 07/13/18 0811 07/13/18 2048  AST 92* 78*  ALT 38 38  ALKPHOS 58 58  BILITOT 0.9 1.0  PROT 6.4* 6.9  ALBUMIN 3.4* 3.6   Recent Labs  Lab  07/13/18 2048  LIPASE 21  AMYLASE 12*   Recent Labs  Lab 07/13/18 2048  AMMONIA 14    ABG    Component Value Date/Time   PHART 7.429 07/13/2018 2025   PCO2ART 36.0 07/13/2018 2025   PO2ART 111 (H) 07/13/2018 2025   HCO3 23.4 07/13/2018 2025   ACIDBASEDEF 0.3 07/13/2018 2025   O2SAT 98.4 07/13/2018 2025     Coagulation Profile: Recent Labs  Lab 07/13/18 2048 07/14/18 0746  INR 1.1 1.1    Cardiac Enzymes: Recent Labs  Lab 07/12/18 1959 07/13/18 0234 07/13/18 0811  TROPONINI 4.12* 7.48* 7.17*    HbA1C: Hgb A1c MFr Bld  Date/Time Value Ref Range Status  07/13/2018 02:34 AM 6.5 (H) 4.8 - 5.6 % Final    Comment:    (NOTE) Pre diabetes:          5.7%-6.4% Diabetes:              >  6.4% Glycemic control for   <7.0% adults with diabetes     CBG: Recent Labs  Lab 07/13/18 2023 07/13/18 2143 07/14/18 0027 07/14/18 0220 07/14/18 0432  GLUCAP 144* 136* 121* 145* 123*      Allergies Allergies  Allergen Reactions  . Codeine Other (See Comments)    Unknown reaction  . Penicillins Other (See Comments)    Unknown reaction Did it involve swelling of the face/tongue/throat, SOB, or low BP? Unknown Did it involve sudden or severe rash/hives, skin peeling, or any reaction on the inside of your mouth or nose? Unknown Did you need to seek medical attention at a hospital or doctor's office? Unknown When did it last happen?unknown (long time ago) If all above answers are "NO", may proceed with cephalosporin use.     Posey Boyer, MSN, AGACNP-BC [facility] Pgr: [phone] or if no answer [phone] 07/14/2018, 9:30 AM   PCCM attending:  This is an 85 year old female admitted for recurrent syncope and hypertension.  Was found to have a NSTEMI and underwent left heart catheterization on 07/13/2018 with stenting of her LAD.  Additionally had recurrent episodes of bradycardia and sinus pauses an episode of witnessed seizure thought to be  related to hypotensive event and patient was transferred to the intensive care unit for further evaluation.  Overnight the patient has done well.  He remained hemodynamically stable.  Therefore patient is stable at this point for transfer from the intensive care unit.  BP 140/63   Pulse 77   Temp 98.2 F (36.8 C) (Oral)   Resp 15   Ht 5\' 4"  (1.626 m)   Wt 56.5 kg   SpO2 95%   BMI 21.39 kg/m   General: Elderly female resting in bed Heart: Regular rate rhythm, S1-S2 Lungs: Clear to auscultation bilaterally no rales no wheeze  Labs reviewed  A: Possible seizure Bradycardia, sinus pauses following an STEMI status post LAD stenting Prolonged QT Paroxysmal atrial fibrillation on oral anticoagulant Possible focal area of splenic injury/small hematoma seen on CT. Coronary artery disease status post left heart cath  P: Appreciate input from neurology regarding AEDs Continue Plavix and Xarelto Patient seems to be hemodynamically and respiratory stable for transfer from intensive care unit for telemetry monitored floor. Monitor H&H with restart of Xarelto We will discussed with hospitalist   Josephine Igo, DO [facility] 07/14/2018 3:21 PM  Personal pager: [phone] If unanswered, please page CCM On-call: #[phone]

## 2018-07-14 NOTE — Progress Notes (Signed)
Progress Note  Patient Name: Elaine Mathews Date of Encounter: 07/14/2018  Primary Cardiologist: Revankar  Subjective   Pt denies chest pain or dyspnea this am.  Events of last night noted. Pt with possible vagal reaction with sinus pauses.   Inpatient Medications    Scheduled Meds:  aspirin EC  81 mg Oral Daily   atorvastatin  40 mg Oral q1800   Chlorhexidine Gluconate Cloth  6 each Topical Daily   clopidogrel  75 mg Oral Q breakfast   insulin aspart  0-9 Units Subcutaneous Q4H   levothyroxine  75 mcg Oral Q0600   lisinopril  40 mg Oral QHS   sodium chloride flush  3 mL Intravenous Q12H   sodium chloride flush  3 mL Intravenous Q12H   Continuous Infusions:  sodium chloride Stopped (07/13/18 2039)   lactated ringers 50 mL/hr at 07/14/18 0600   levETIRAcetam     nitroGLYCERIN 5 mcg/min (07/13/18 1630)   PRN Meds: sodium chloride, acetaminophen, labetalol, ondansetron (ZOFRAN) IV, sodium chloride flush   Vital Signs    Vitals:   07/14/18 0400 07/14/18 0500 07/14/18 0600 07/14/18 0700  BP: (!) 148/126 (!) 129/47    Pulse: 71 68 74 64  Resp: Temp: 98.5 F (36.9 C)     TempSrc: Oral     SpO2: 100% 100% 93% 98%  Weight:      Height:        Intake/Output Summary (Last 24 hours) at 07/14/2018 0800 Last data filed at 07/14/2018 0600 Gross per 24 hour  Intake 458.59 ml  Output 1 ml  Net 457.59 ml   Last 3 Weights 07/13/2018 07/12/2018 05/18/2018  Weight (lbs) 124 lb 9.6 oz 125 lb 11.2 oz 126 lb 3.2 oz  Weight (kg) 56.518 kg 57.017 kg 57.244 kg      Telemetry    Sinus - Personally Reviewed  ECG     NSR, RBBB- Personally Reviewed  Physical Exam   GEN: No acute distress.   Neck: No JVD Cardiac: RRR, no murmurs, rubs, or gallops.  Respiratory: Clear to auscultation bilaterally. GI: Soft, nontender, non-distended  MS: No edema; No deformity. Neuro:  Nonfocal  Psych: Normal affect   Labs    Chemistry Recent Labs  Lab 07/13/18 0811  07/13/18 2048  NA 132* 133*  K 3.5 3.6  CL 99 99  CO2 21* 24  GLUCOSE 138* 150*  BUN 7* 7*  CREATININE 0.65 0.56  CALCIUM 8.7* 8.9  PROT 6.4* 6.9  ALBUMIN 3.4* 3.6  AST 92* 78*  ALT 38 38  ALKPHOS 58 58  BILITOT 0.9 1.0  GFRNONAA >60 >60  GFRAA >60 >60  ANIONGAP 12 10     Hematology Recent Labs  Lab 07/13/18 0811 07/13/18 2048  WBC 5.7 7.7  RBC 3.56* 3.76*  HGB 10.7* 11.3*  HCT 32.0* 33.9*  MCV 89.9 90.2  MCH 30.1 30.1  MCHC 33.4 33.3  RDW 13.7 13.8  PLT 122* 121*    Cardiac Enzymes Recent Labs  Lab 07/12/18 1959 07/13/18 0234 07/13/18 0811  TROPONINI 4.12* 7.48* 7.17*   No results for input(s): TROPIPOC in the last 168 hours.   BNPNo results for input(s): BNP, PROBNP in the last 168 hours.   DDimer No results for input(s): DDIMER in the last 168 hours.   Radiology    Ct Head Wo Contrast  Result Date: 07/14/2018 CLINICAL DATA:  New onset seizure EXAM: CT HEAD WITHOUT CONTRAST TECHNIQUE: Contiguous axial images  were obtained from the base of the skull through the vertex without intravenous contrast. COMPARISON:  Head CT 07/12/2018 FINDINGS: Brain: There is no mass, hemorrhage or extra-axial collection. There is generalized atrophy without lobar predilection. Hypodensity of the white matter is most commonly associated with chronic microvascular disease. There is an old left parietal infarct, unchanged. Vascular: No abnormal hyperdensity of the major intracranial arteries or dural venous sinuses. No intracranial atherosclerosis. Skull: The visualized skull base, calvarium and extracranial soft tissues are normal. Sinuses/Orbits: Partial left fronto ethmoid opacification. No mastoid or middle ear effusion. The orbits are normal. IMPRESSION: 1. No acute intracranial abnormality. 2. Chronic small vessel ischemia and old right parietal infarct. Electronically Signed   By: Deatra Robinson M.D.   On: 07/14/2018 01:26   Ct Abdomen Pelvis W Contrast  Result Date:  07/14/2018 CLINICAL DATA:  85 year old female with abdominal pain and fever. Concern for abscess. EXAM: CT ABDOMEN AND PELVIS WITH CONTRAST TECHNIQUE: Multidetector CT imaging of the abdomen and pelvis was performed using the standard protocol following bolus administration of intravenous contrast. CONTRAST:  OMNIPAQUE IOHEXOL 300 MG/ML  SOLN COMPARISON:  CT of the abdomen pelvis dated 09/01/2016 FINDINGS: Evaluation of this exam is limited due to respiratory motion artifact. Lower chest: The visualized lung bases are clear. There is coronary vascular calcification as well as calcification of the mitral annulus. No intra-abdominal free air or free fluid. Hepatobiliary: The liver is unremarkable. No intrahepatic biliary ductal dilatation. Cholecystectomy. There is mild dilatation of the central CBD, likely post cholecystectomy. No calcified stone noted in the central CBD. Pancreas: Unremarkable. No pancreatic ductal dilatation or surrounding inflammatory changes. Spleen: There is a focal area of hypoenhancement in the mid to upper pole of the spleen measuring up to 22 x 18 mm likely an area of splenic infarct or laceration. There is a small amount of slightly higher attenuating fluid along the lateral aspect of the spleen which may represent a subacute perisplenic or subcapsular hematoma. No extravasation of contrast to suggest active bleed. Adrenals/Urinary Tract: The adrenal glands are unremarkable. There is no hydronephrosis on either side. Areas of renal cortical scarring noted bilaterally. There is symmetric enhancement and excretion of contrast by both kidneys. The visualized ureters appear unremarkable. Excreted contrast is noted within the urinary bladder. Evaluation of the bladder is somewhat limited due to presence of high attenuating contrast. Stomach/Bowel: There is sigmoid diverticulosis without active inflammatory changes. Evaluation of the bowel is limited due to respiratory motion artifact. There  is no bowel obstruction or active inflammation. Appendectomy. Vascular/Lymphatic: Advanced aortoiliac atherosclerotic disease. No portal venous gas. There is no adenopathy. Reproductive: The uterus and ovaries are grossly unremarkable. Other: None Musculoskeletal: Osteopenia with degenerative changes of the spine. No acute osseous pathology. IMPRESSION: 1. Probable focal area of traumatic splenic injury/laceration with a small perisplenic or subcapsular hematoma. No extravasation of contrast or evidence of active bleed. 2. Sigmoid diverticulosis. No bowel obstruction or active inflammation. Electronically Signed   By: Elgie Collard M.D.   On: 07/14/2018 01:33   Dg Abd Portable 2v  Result Date: 07/13/2018 CLINICAL DATA:  85 year old female with abdominal pain. Altered mental status. EXAM: PORTABLE ABDOMEN - 2 VIEW COMPARISON:  CT of the abdomen pelvis dated 09/01/2016 FINDINGS: There is no bowel dilatation or evidence of obstruction. No free air identified. Excreted intravenous contrast in the renal collecting system and bladder noted. Right upper quadrant cholecystectomy clips. There is degenerative changes of the spine. No acute osseous pathology. IMPRESSION: No acute  findings by radiograph. Electronically Signed   By: Elgie Collard M.D.   On: 07/13/2018 19:51    Cardiac Studies   N/a   Patient Profile     85 y.o. female with a past medical history significant for paroxysmal atrial fibrillation (supposed to be on Xarelto), prior CVA, uncontrolled hypertension, diabetes mellitus, hyperlipidemia, hypothyroidism, and vitamin D deficiency who presented to the hospital after a syncopal episode. Cardiac cath 07/13/18 with severe stenosis LAD treated with a DES.   Assessment & Plan    1. CAD/NSTEMI in the setting of near syncope: No chest pain this am. She is s/p placement of a drug eluting stent in the LAD 07/13/18. Will continue Plavix. Will stop ASA this am and start Xarelto.Sinus pauses last night.  Metoprolol and Cardizem were held.   2. HTN: BP stable this am  3. PAF/sinus pauses: Beta blocker and Cardizem on hold due to sinus pauses yesterday. Possible vagal event. Will not restart at this time. Will follow on telemetry today and if further long pauses, will need EP input for possible pacemaker. Will restart Xarelto today.   4. HLD: Continue statin. .   5. DM: Continue SSI      Signed, Verne Carrow, MD  07/14/2018, 8:00 AM

## 2018-07-14 NOTE — Progress Notes (Signed)
Report given to [facility] RN.Transferred pt.to 2H 21 in bed with assistance of  [place] rapid response nurse .

## 2018-07-14 NOTE — Progress Notes (Signed)
Transferred pt to 3E23 via wheelchair. Escorted by RN & NT. VSS during transport. Report previously called to Okey Regal, Charity fundraiser.

## 2018-07-14 NOTE — Progress Notes (Signed)
Attempted to call report. Call back number provided

## 2018-07-14 NOTE — Progress Notes (Signed)
ANTICOAGULATION CONSULT NOTE - Initial Consult  Pharmacy Consult for heparin Indication: atrial fibrillation  Allergies  Allergen Reactions  . Codeine Other (See Comments)    Unknown reaction  . Penicillins Other (See Comments)    Unknown reaction Did it involve swelling of the face/tongue/throat, SOB, or low BP? Unknown Did it involve sudden or severe rash/hives, skin peeling, or any reaction on the inside of your mouth or nose? Unknown Did you need to seek medical attention at a hospital or doctor's office? Unknown When did it last happen?unknown (long time ago) If all above answers are "NO", may proceed with cephalosporin use.    Patient Measurements: Height: 5\' 4"  (162.6 cm) Weight: 124 lb 9.6 oz (56.5 kg) IBW/kg (Calculated) : 54.7  Vital Signs: Temp: 98.7 F (37.1 C) (06/02 0000) Temp Source: Oral (06/02 0000) BP: 167/73 (06/01 2010) Pulse Rate: 62 (06/01 2010)  Labs: Recent Labs    07/12/18 1959 07/13/18 0234 07/13/18 0811 07/13/18 2048  HGB  --   --  10.7* 11.3*  HCT  --   --  32.0* 33.9*  PLT  --   --  122* 121*  APTT  --  127*  --  39*  LABPROT  --   --   --  14.2  INR  --   --   --  1.1  HEPARINUNFRC  --  0.42 0.30  --   CREATININE  --   --  0.65 0.56  TROPONINI 4.12* 7.48* 7.17*  --     Estimated Creatinine Clearance: 46 mL/min (by C-G formula based on SCr of 0.56 mg/dL).   Medical History: Past Medical History:  Diagnosis Date  . Allergic rhinosinusitis   . Arthritis   . Cataract, bilateral   . Diabetes (HCC)   . Diabetic nephropathy (HCC)   . Elevated liver enzymes   . Essential hypertension   . Fatty liver   . Foot lesion   . Full dentures   . Hyperlipidemia   . Hypothyroidism   . Leg pain, bilateral   . Mild asthma   . Mild asthma   . PVD (peripheral vascular disease) (HCC)   . Stroke (HCC)   . Vitamin D deficiency     Assessment: 85yo female now s/p cardiac cath w/ lesion too small for PCI, to resume heparin 2hr after  TR band removed; band was deflated to 59ml earlier but band was just removed by RN; heparin to resume for medical management of CAD as well as PAF with plan to resume Xarelto shortly.  Goal of Therapy:  Heparin level 0.3-0.7 units/ml Monitor platelets by anticoagulation protocol: Yes   Plan:  Will resume heparin gtt 1000 units/hr at 0330 this am and monitor heparin levels and CBC; f/u resuming Xarelto.  Vernard Gambles, PharmD, BCPS  07/14/2018,1:41 AM

## 2018-07-15 ENCOUNTER — Encounter (HOSPITAL_COMMUNITY): Payer: Self-pay

## 2018-07-15 ENCOUNTER — Other Ambulatory Visit: Payer: Self-pay

## 2018-07-15 DIAGNOSIS — S36029A Unspecified contusion of spleen, initial encounter: Secondary | ICD-10-CM | POA: Clinically undetermined

## 2018-07-15 DIAGNOSIS — R55 Syncope and collapse: Secondary | ICD-10-CM | POA: Diagnosis present

## 2018-07-15 DIAGNOSIS — R569 Unspecified convulsions: Secondary | ICD-10-CM

## 2018-07-15 HISTORY — DX: Syncope and collapse: R55

## 2018-07-15 HISTORY — DX: Unspecified contusion of spleen, initial encounter: S36.029A

## 2018-07-15 LAB — CBC
HCT: 30.2 % — ABNORMAL LOW (ref 36.0–46.0)
Hemoglobin: 10.1 g/dL — ABNORMAL LOW (ref 12.0–15.0)
MCH: 30.1 pg (ref 26.0–34.0)
MCHC: 33.4 g/dL (ref 30.0–36.0)
MCV: 89.9 fL (ref 80.0–100.0)
Platelets: 117 10*3/uL — ABNORMAL LOW (ref 150–400)
RBC: 3.36 MIL/uL — ABNORMAL LOW (ref 3.87–5.11)
RDW: 13.4 % (ref 11.5–15.5)
WBC: 5.3 10*3/uL (ref 4.0–10.5)
nRBC: 0 % (ref 0.0–0.2)

## 2018-07-15 LAB — GLUCOSE, CAPILLARY
Glucose-Capillary: 115 mg/dL — ABNORMAL HIGH (ref 70–99)
Glucose-Capillary: 154 mg/dL — ABNORMAL HIGH (ref 70–99)
Glucose-Capillary: 143 mg/dL — ABNORMAL HIGH (ref 70–99)
Glucose-Capillary: 156 mg/dL — ABNORMAL HIGH (ref 70–99)
Glucose-Capillary: 138 mg/dL — ABNORMAL HIGH (ref 70–99)

## 2018-07-15 MED ORDER — INSULIN ASPART 100 UNIT/ML ~~LOC~~ SOLN
0.0000 [IU] | Freq: Three times a day (TID) | SUBCUTANEOUS | Status: DC
  Administered 2018-07-15: 2 [IU] via SUBCUTANEOUS
  Administered 2018-07-15: 1 [IU] via SUBCUTANEOUS
  Administered 2018-07-16 – 2018-07-17 (×2): 2 [IU] via SUBCUTANEOUS
  Administered 2018-07-18: 1 [IU] via SUBCUTANEOUS

## 2018-07-15 MED ORDER — LEVETIRACETAM 500 MG PO TABS
500.0000 mg | ORAL_TABLET | Freq: Two times a day (BID) | ORAL | Status: DC
  Administered 2018-07-15 – 2018-07-18 (×6): 500 mg via ORAL
  Filled 2018-07-15 (×6): qty 1

## 2018-07-15 MED ORDER — DILTIAZEM HCL ER COATED BEADS 180 MG PO CP24
180.0000 mg | ORAL_CAPSULE | Freq: Every day | ORAL | Status: DC
  Administered 2018-07-15 – 2018-07-18 (×4): 180 mg via ORAL
  Filled 2018-07-15 (×4): qty 1

## 2018-07-15 NOTE — Progress Notes (Signed)
Erin grandaughter called to find out update and if pt coming home, I advised Dr Allena Katz mentioned she might but he was going to talk to cardiology,i told her I would page him to call her for update and she appreciated it as she wanted to know how she was doing. I advised she is up in chair, eaten breakfast and lunch and worked with therapy today

## 2018-07-15 NOTE — Evaluation (Signed)
Occupational Therapy Evaluation Patient Details Name: Elaine Mathews MRN: [id number] DOB: [birth year] Today's Date: 07/15/2018    History of Present Illness Pt is an 85 year old woman admitted from [facility] with NSTEMI on 07/12/18. Hospital course complicated by seizure on 07/14/18 and incidental finding of splenic injury. PMH: CVA, HTN, afib, IBS, hypothyroidism.   Clinical Impression   Pt reports living alone, ambulating with a rollator and functioning independently in ADL. She has intermittent assistance of her grandchildren at home. Pt presents with impaired motor planning, L UE incoordination with decreased sensation, impaired cognition and decreased balance. She requires min assist for ADL and OOB mobility. Pt with bizarre, tangential comments throughout session. Recommending SNF as pt does not have 24 hour care and will need further rehab.     Follow Up Recommendations  SNF;Supervision/Assistance - 24 hour    Equipment Recommendations  3 in 1 bedside commode    Recommendations for Other Services       Precautions / Restrictions Precautions Precautions: Fall Restrictions Weight Bearing Restrictions: No      Mobility Bed Mobility Overal bed mobility: Needs Assistance Bed Mobility: Supine to Sit     Supine to sit: Min guard     General bed mobility comments: min guard for safety, increased time and effort required  Transfers Overall transfer level: Needs assistance Equipment used: 1 person hand held assist;Rolling walker (2 wheeled) Transfers: Stand Pivot Transfers;Sit to/from Stand Sit to Stand: Min assist Stand pivot transfers: Mod assist       General transfer comment: min A for sit to stand, modA and increased verbal and tactile cuing to pivot to [facility] with reaching with L hand to left handrail of BSC.     Balance Overall balance assessment: Needs assistance Sitting-balance support: No upper extremity supported;Feet supported Sitting balance-Leahy Scale:  Fair     Standing balance support: During functional activity;Single extremity supported;No upper extremity supported Standing balance-Leahy Scale: Fair Standing balance comment: statically at sink                           ADL either performed or assessed with clinical judgement   ADL Overall ADL's : Needs assistance/impaired Eating/Feeding: Set up;Sitting   Grooming: Wash/dry hands;Minimal assistance;Standing;Cueing for sequencing   Upper Body Bathing: Minimal assistance;Sitting   Lower Body Bathing: Minimal assistance;Sit to/from stand   Upper Body Dressing : Minimal assistance;Sitting   Lower Body Dressing: Minimal assistance;Sit to/from stand   Toilet Transfer: Stand-pivot;BSC;Moderate Dentist Details (indicate cue type and reason): inefficient movement, difficulty motor planning pivot Toileting- Clothing Manipulation and Hygiene: Minimal assistance;Sit to/from stand       Functional mobility during ADLs: Minimal assistance;Rolling walker;Cueing for safety       Vision Patient Visual Report: No change from baseline       Perception     Praxis      Pertinent Vitals/Pain Pain Assessment: Faces Faces Pain Scale: No hurt     Hand Dominance Right   Extremity/Trunk Assessment Upper Extremity Assessment Upper Extremity Assessment: LUE deficits/detail LUE Deficits / Details: incoordination, assistance needed to grasp walker handle, confused grasping PT's hand with walker handle LUE Sensation: decreased proprioception LUE Coordination: decreased fine motor;decreased gross motor   Lower Extremity Assessment Lower Extremity Assessment: Defer to PT evaluation       Communication Communication Communication: No difficulties   Cognition Arousal/Alertness: Awake/alert Behavior During Therapy: Impulsive;Restless;Anxious Overall Cognitive Status: Impaired/Different from baseline Area of Impairment:  Following  commands;Safety/judgement;Awareness;Problem solving;Attention                   Current Attention Level: Selective   Following Commands: Follows multi-step commands inconsistently;Follows one step commands inconsistently;Follows multi-step commands with increased time;Follows one step commands with increased time Safety/Judgement: Decreased awareness of safety;Decreased awareness of deficits Awareness: Emergent Problem Solving: Slow processing;Decreased initiation;Difficulty sequencing;Requires verbal cues;Requires tactile cues General Comments: pt labile, repeats she is going to heaven, wants to go soon    General Comments  VSS    Exercises     Shoulder Instructions      Home Living Family/patient expects to be discharged to:: Private residence Living Arrangements: Alone Available Help at Discharge: Family;Available PRN/intermittently(grandchildren) Type of Home: Apartment Home Access: Stairs to enter Entrance Stairs-Number of Steps: 1   Home Layout: One level     Bathroom Shower/Tub: Chief Strategy OfficerTub/shower unit   Bathroom Toilet: Standard     Home Equipment: Environmental consultantWalker - 4 wheels          Prior Functioning/Environment Level of Independence: Independent        Comments: reports she has intermittent assist of her grandson, later said granddaughter helps her at times        OT Problem List: Decreased strength;Decreased activity tolerance;Impaired balance (sitting and/or standing);Decreased coordination;Decreased cognition;Decreased safety awareness;Decreased knowledge of use of DME or AE;Impaired UE functional use      OT Treatment/Interventions: Self-care/ADL training;DME and/or AE instruction;Therapeutic activities;Cognitive remediation/compensation;Patient/family education;Balance training    OT Goals(Current goals can be found in the care plan section) Acute Rehab OT Goals Patient Stated Goal: to go to heaven OT Goal Formulation: With patient Time For Goal  Achievement: 07/29/18 Potential to Achieve Goals: Good ADL Goals Pt Will Perform Grooming: with supervision;standing Pt Will Perform Upper Body Dressing: with supervision;sitting Pt Will Perform Lower Body Dressing: with supervision;sit to/from stand Pt Will Transfer to Toilet: with supervision;ambulating;bedside commode Pt Will Perform Toileting - Clothing Manipulation and hygiene: with supervision;sit to/from stand Additional ADL Goal #1: Pt will identify and gather items necessary for ADL around her room with supervision and rollator.  OT Frequency: Min 2X/week   Barriers to D/C: Decreased caregiver support          Co-evaluation PT/OT/SLP Co-Evaluation/Treatment: Yes Reason for Co-Treatment: Necessary to address cognition/behavior during functional activity   OT goals addressed during session: ADL's and self-care      AM-PAC OT "6 Clicks" Daily Activity     Outcome Measure Help from another person eating meals?: A Little Help from another person taking care of personal grooming?: A Little Help from another person toileting, which includes using toliet, bedpan, or urinal?: A Little Help from another person bathing (including washing, rinsing, drying)?: A Little Help from another person to put on and taking off regular upper body clothing?: A Little Help from another person to put on and taking off regular lower body clothing?: A Little 6 Click Score: 18   End of Session Equipment Utilized During Treatment: Gait belt;Rolling walker  Activity Tolerance: Patient tolerated treatment well Patient left: in chair;with call bell/phone within reach;with chair alarm set  OT Visit Diagnosis: Unsteadiness on feet (R26.81);Other abnormalities of gait and mobility (R26.89);History of falling (Z91.81);Muscle weakness (generalized) (M62.81);Other symptoms and signs involving cognitive function;Hemiplegia and hemiparesis Hemiplegia - Right/Left: Left Hemiplegia - dominant/non-dominant:  Non-Dominant Hemiplegia - caused by: Cerebral infarction                Time: [id number] OT Time Calculation (min): 24 min  Charges:  OT General Charges $OT Visit: 1 Visit OT Evaluation $OT Eval Moderate Complexity: 1 Mod  Martie Round, OTR/L Acute Rehabilitation Services Pager: [phone] Office: [phone]  Evern Bio 07/15/2018, 2:09 PM

## 2018-07-15 NOTE — NC FL2 (Signed)
[place] MEDICAID FL2 LEVEL OF CARE SCREENING TOOL     IDENTIFICATION  Patient Name: Elaine Mathews Birthdate: Mar 26, 1933 Sex: female Admission Date (Current Location): 07/12/2018  [facility] and [place] Number:  [employer] and Address:  [facility] [place]. [facility], [address], [place], [place] [address]      Provider Number: [id number]  Attending Physician Name and Address:  Rolly Salter, MD  Relative Name and Phone Number:       Current Level of Care: Hospital Recommended Level of Care: Skilled Nursing Facility Prior Approval Number:    Date Approved/Denied:   PASRR Number: [id number]  Discharge Plan: SNF    Current Diagnoses: Patient Active Problem List   Diagnosis Date Noted  . Spleen injury 07/15/2018  . Spleen hematoma without rupture of capsule, without open wound into cavity 07/15/2018  . Syncope, vasovagal 07/15/2018  . Seizure (HCC) 07/15/2018  . Sinus pause 07/13/2018  . NSTEMI (non-ST elevated myocardial infarction) (HCC) 07/12/2018  . Hypertensive urgency 07/12/2018  . Elevated LFTs 07/12/2018  . Paroxysmal atrial fibrillation (HCC) 11/13/2017  . Hypertension 11/13/2017  . Pre-ulcerative calluses 01/06/2017  . Controlled type 2 diabetes with neuropathy (HCC) 01/06/2017    Orientation RESPIRATION BLADDER Height & Weight     Self, Time, Situation  Normal Incontinent Weight: 126 lb 1.7 oz (57.2 kg)(scale b) Height:  5\' 4"  (162.6 cm)  BEHAVIORAL SYMPTOMS/MOOD NEUROLOGICAL BOWEL NUTRITION STATUS  (None) Convulsions/Seizures Incontinent Diet(Heart healthy)  AMBULATORY STATUS COMMUNICATION OF NEEDS Skin   Limited Assist Verbally Bruising                       Personal Care Assistance Level of Assistance  Bathing, Feeding, Dressing Bathing Assistance: Limited assistance Feeding assistance: Limited assistance Dressing Assistance: Limited assistance     Functional Limitations Info  Sight, Hearing, Speech Sight Info:  Adequate Hearing Info: Adequate Speech Info: Adequate    SPECIAL CARE FACTORS FREQUENCY  PT (By licensed PT), OT (By licensed OT)     PT Frequency: 5 x week OT Frequency: 5 x week            Contractures Contractures Info: Not present    Additional Factors Info  Code Status, Allergies Code Status Info: Full code Allergies Info: Codeine, Penicillins           Current Medications (07/15/2018):  This is the current hospital active medication list Current Facility-Administered Medications  Medication Dose Route Frequency Provider Last Rate Last Dose  . 0.9 %  sodium chloride infusion  250 mL Intravenous PRN Laverna Peace, MD   Stopped at 07/13/18 2039  . acetaminophen (TYLENOL) tablet 650 mg  650 mg Oral Q4H PRN Roberto Scales D, MD   650 mg at 07/15/18 0517  . atorvastatin (LIPITOR) tablet 40 mg  40 mg Oral q1800 Roberto Scales D, MD   40 mg at 07/14/18 1743  . clopidogrel (PLAVIX) tablet 75 mg  75 mg Oral Q breakfast Roberto Scales D, MD   75 mg at 07/15/18 0830  . diltiazem (CARDIZEM CD) 24 hr capsule 180 mg  180 mg Oral Daily Laverda Page B, NP      . insulin aspart (novoLOG) injection 0-9 Units  0-9 Units Subcutaneous TID WC & HS Rolly Salter, MD   1 Units at 07/15/18 1232  . levETIRAcetam (KEPPRA) tablet 500 mg  500 mg Oral BID Rolly Salter, MD      . levothyroxine (SYNTHROID)  tablet 75 mcg  75 mcg Oral Q0600 Roberto Scales D, MD   75 mcg at 07/15/18 0518  . lisinopril (ZESTRIL) tablet 40 mg  40 mg Oral QHS Roberto Scales D, MD   40 mg at 07/14/18 2144  . nitroGLYCERIN 50 mg in dextrose 5 % 250 mL (0.2 mg/mL) infusion  0-60 mcg/min Intravenous Titrated Roberto Scales D, MD 1.5 mL/hr at 07/13/18 1630 5 mcg/min at 07/13/18 1630  . ondansetron (ZOFRAN) injection 4 mg  4 mg Intravenous Q6H PRN Roberto Scales D, MD   4 mg at 07/13/18 1859  . rivaroxaban (XARELTO) tablet 20 mg  20 mg Oral Q supper Roberto Scales D, MD      . sodium chloride flush (NS) 0.9 % injection  3 mL  3 mL Intravenous Q12H Roberto Scales D, MD   3 mL at 07/13/18 2233  . sodium chloride flush (NS) 0.9 % injection 3 mL  3 mL Intravenous Q12H Roberto Scales D, MD   3 mL at 07/15/18 0830  . sodium chloride flush (NS) 0.9 % injection 3 mL  3 mL Intravenous PRN Laverna Peace, MD         Discharge Medications: Please see discharge summary for a list of discharge medications.  Relevant Imaging Results:  Relevant Lab Results:   Additional Information SS#: [id number]  Margarito Liner, LCSW

## 2018-07-15 NOTE — Progress Notes (Signed)
Progress Note  Patient Name: Elaine Mathews Date of Encounter: 07/15/2018  Primary Cardiologist: Revankar  Subjective   No pain or dyspnea.   Inpatient Medications    Scheduled Meds:  atorvastatin  40 mg Oral q1800   clopidogrel  75 mg Oral Q breakfast   insulin aspart  0-9 Units Subcutaneous TID WC & HS   levothyroxine  75 mcg Oral Q0600   lisinopril  40 mg Oral QHS   rivaroxaban  20 mg Oral Q supper   sodium chloride flush  3 mL Intravenous Q12H   sodium chloride flush  3 mL Intravenous Q12H   Continuous Infusions:  sodium chloride Stopped (07/13/18 2039)   lactated ringers 50 mL/hr at 07/14/18 1800   levETIRAcetam Stopped (07/15/18 0849)   nitroGLYCERIN 5 mcg/min (07/13/18 1630)   PRN Meds: sodium chloride, acetaminophen, ondansetron (ZOFRAN) IV, sodium chloride flush   Vital Signs    Vitals:   07/14/18 1700 07/14/18 1929 07/14/18 2350 07/15/18 0509  BP: (!) 154/92 137/80 132/70 (!) 157/71  Pulse: 78 92  87  Resp: (!) Temp:  98 F (36.7 C) (!) 97.2 F (36.2 C) 98 F (36.7 C)  TempSrc:  Oral Oral   SpO2: (!) 89% 96% 97% 94%  Weight: 57.4 kg   57.2 kg  Height:  (1.626 m)       Intake/Output Summary (Last 24 hours) at 07/15/2018 0924 Last data filed at 07/15/2018 0900 Gross per 24 hour  Intake 1912.36 ml  Output 850 ml  Net 1062.36 ml   Last 3 Weights 07/15/2018 07/14/2018 07/13/2018  Weight (lbs) 126 lb 1.7 oz 126 lb 8.7 oz 124 lb 9.6 oz  Weight (kg) 57.2 kg 57.4 kg 56.518 kg      Telemetry    Sinus - Personally Reviewed  ECG    No AM EKG: Personally Reviewed  Physical Exam    General: Well developed, well nourished, NAD  HEENT: OP clear, mucus membranes moist  SKIN: warm, dry. No rashes. Neuro: No focal deficits  Musculoskeletal: Muscle strength 5/5 all ext  Psychiatric: Mood and affect normal  Neck: No JVD, no carotid bruits, no thyromegaly, no lymphadenopathy.  Lungs:Clear bilaterally, no wheezes, rhonci,  crackles Cardiovascular: Regular rate and rhythm. No murmurs, gallops or rubs. Abdomen:Soft. Bowel sounds present. Non-tender.  Extremities: No lower extremity edema. Pulses are 2 + in the bilateral DP/PT.   Labs    Chemistry Recent Labs  Lab 07/13/18 0811 07/13/18 2048 07/14/18 0746  NA 132* 133* 135  K 3.5 3.6 3.2*  CL 99 99 101  CO2 21* 24 23  GLUCOSE 138* 150* 113*  BUN 7* 7* 7*  CREATININE 0.65 0.56 0.72  CALCIUM 8.7* 8.9 8.7*  PROT 6.4* 6.9  --   ALBUMIN 3.4* 3.6  --   AST 92* 78*  --   ALT 38 38  --   ALKPHOS 58 58  --   BILITOT 0.9 1.0  --   GFRNONAA >60 >60 >60  GFRAA >60 >60 >60  ANIONGAP Hematology Recent Labs  Lab 07/13/18 2048 07/14/18 0746 07/15/18 0503  WBC 7.7 6.4 5.3  RBC 3.76* 3.54* 3.36*  HGB 11.3* 10.6* 10.1*  HCT 33.9* 31.8* 30.2*  MCV 90.2 89.8 89.9  MCH 30.1 29.9 30.1  MCHC 33.3 33.3 33.4  RDW 13.8 13.6 13.4  PLT 121* 121* 117*    Cardiac Enzymes Recent Labs  Lab 07/12/18 1959  07/13/18 0234 07/13/18 0811  TROPONINI 4.12* 7.48* 7.17*   No results for input(s): TROPIPOC in the last 168 hours.   BNPNo results for input(s): BNP, PROBNP in the last 168 hours.   DDimer No results for input(s): DDIMER in the last 168 hours.   Radiology    Ct Head Wo Contrast  Result Date: 07/14/2018 CLINICAL DATA:  New onset seizure EXAM: CT HEAD WITHOUT CONTRAST TECHNIQUE: Contiguous axial images were obtained from the base of the skull through the vertex without intravenous contrast. COMPARISON:  Head CT 07/12/2018 FINDINGS: Brain: There is no mass, hemorrhage or extra-axial collection. There is generalized atrophy without lobar predilection. Hypodensity of the white matter is most commonly associated with chronic microvascular disease. There is an old left parietal infarct, unchanged. Vascular: No abnormal hyperdensity of the major intracranial arteries or dural venous sinuses. No intracranial atherosclerosis. Skull: The visualized  skull base, calvarium and extracranial soft tissues are normal. Sinuses/Orbits: Partial left fronto ethmoid opacification. No mastoid or middle ear effusion. The orbits are normal. IMPRESSION: 1. No acute intracranial abnormality. 2. Chronic small vessel ischemia and old right parietal infarct. Electronically Signed   By: Deatra Robinson M.D.   On: 07/14/2018 01:26   Ct Abdomen Pelvis W Contrast  Result Date: 07/14/2018 CLINICAL DATA:  85 year old female with abdominal pain and fever. Concern for abscess. EXAM: CT ABDOMEN AND PELVIS WITH CONTRAST TECHNIQUE: Multidetector CT imaging of the abdomen and pelvis was performed using the standard protocol following bolus administration of intravenous contrast. CONTRAST:  OMNIPAQUE IOHEXOL 300 MG/ML  SOLN COMPARISON:  CT of the abdomen pelvis dated 09/01/2016 FINDINGS: Evaluation of this exam is limited due to respiratory motion artifact. Lower chest: The visualized lung bases are clear. There is coronary vascular calcification as well as calcification of the mitral annulus. No intra-abdominal free air or free fluid. Hepatobiliary: The liver is unremarkable. No intrahepatic biliary ductal dilatation. Cholecystectomy. There is mild dilatation of the central CBD, likely post cholecystectomy. No calcified stone noted in the central CBD. Pancreas: Unremarkable. No pancreatic ductal dilatation or surrounding inflammatory changes. Spleen: There is a focal area of hypoenhancement in the mid to upper pole of the spleen measuring up to 22 x 18 mm likely an area of splenic infarct or laceration. There is a small amount of slightly higher attenuating fluid along the lateral aspect of the spleen which may represent a subacute perisplenic or subcapsular hematoma. No extravasation of contrast to suggest active bleed. Adrenals/Urinary Tract: The adrenal glands are unremarkable. There is no hydronephrosis on either side. Areas of renal cortical scarring noted bilaterally. There is  symmetric enhancement and excretion of contrast by both kidneys. The visualized ureters appear unremarkable. Excreted contrast is noted within the urinary bladder. Evaluation of the bladder is somewhat limited due to presence of high attenuating contrast. Stomach/Bowel: There is sigmoid diverticulosis without active inflammatory changes. Evaluation of the bowel is limited due to respiratory motion artifact. There is no bowel obstruction or active inflammation. Appendectomy. Vascular/Lymphatic: Advanced aortoiliac atherosclerotic disease. No portal venous gas. There is no adenopathy. Reproductive: The uterus and ovaries are grossly unremarkable. Other: None Musculoskeletal: Osteopenia with degenerative changes of the spine. No acute osseous pathology. IMPRESSION: 1. Probable focal area of traumatic splenic injury/laceration with a small perisplenic or subcapsular hematoma. No extravasation of contrast or evidence of active bleed. 2. Sigmoid diverticulosis. No bowel obstruction or active inflammation. Electronically Signed   By: Elgie Collard M.D.   On: 07/14/2018 01:33   Dg Abd  Portable 2v  Result Date: 07/13/2018 CLINICAL DATA:  85 year old female with abdominal pain. Altered mental status. EXAM: PORTABLE ABDOMEN - 2 VIEW COMPARISON:  CT of the abdomen pelvis dated 09/01/2016 FINDINGS: There is no bowel dilatation or evidence of obstruction. No free air identified. Excreted intravenous contrast in the renal collecting system and bladder noted. Right upper quadrant cholecystectomy clips. There is degenerative changes of the spine. No acute osseous pathology. IMPRESSION: No acute findings by radiograph. Electronically Signed   By: Elgie Collard M.D.   On: 07/13/2018 19:51    Cardiac Studies     Patient Profile     85 y.o. female with a past medical history significant for paroxysmal atrial fibrillation (supposed to be on Xarelto), prior CVA, uncontrolled hypertension, diabetes mellitus,  hyperlipidemia, hypothyroidism, and vitamin D deficiency who presented to the hospital after a syncopal episode. Cardiac cath 07/13/18 with severe stenosis LAD treated with a DES. Metoprolol and Cardizem held due to sinus pauses.   Assessment & Plan    1. CAD/NSTEMI in the setting of near syncope: No chest pain this am. She is s/p placement of a drug eluting stent in the LAD 07/13/18. Continue Plavix, statin and Xarelto. No ASA since she is on both Plavix and Xarelto. Metoprolol and Cardizem were held due to sinus arrest post PCI which was felt to be due to a vasovagal mechanism. No further pauses noted over last 36 hours. Tele shows sinus rhythm this am with no pauses. She had been on Toprol and Cardizem at home. I would not restart at this time. She will need a 30 day event monitor at time of discharge. Eventually will need to be restarted on AV nodal blocking agents. If unable to tolerate, would need a pacemaker.   -From a cardiac perspective, she can be discharged. We can arrange an event monitor and close cardiac follow up  2. HTN: BP is controlled  3. PAF/sinus pauses: Beta blocker and Cardizem on hold due to sinus pauses post PCI. Possible vagal event. Will not restart. Will continue Xarelto.    4. HLD: Continue statin. .   5. DM: Continue SSI      Signed, Verne Carrow, MD  07/15/2018, 9:24 AM

## 2018-07-15 NOTE — Progress Notes (Addendum)
CR nurse informed this nurse of afib which appears new, spoke to [facility] in [facility] and appears to revert to afib around 1440, pt asleep in no distress, , bp stable , hr 110-130ss, paged Dr Allena Katz and Su Hilt NP for cardiology,

## 2018-07-15 NOTE — TOC Initial Note (Signed)
Transition of Care [facility]) - Initial/Assessment Note    Patient Details  Name: Elaine Mathews MRN: [id number] Date of Birth: 07/15/1933  [facility]) CM/SW Contact:    Elaine Chroman, LCSW Phone Number: 07/15/2018, 4:21 PM  Clinical Narrative: CSW met with patient, introduced role, and explained that PT recommendations would be discussed. Patient agreeable to SNF placement but wanted CSW to discuss with her granddaughter. Left list of CMS scores for facilities within 25 miles of her zip code in her room to review. Called granddaughter. She is agreeable to SNF as well. Told her how to access CMS Medicare scores. Will follow up once bed offers available. No further concerns. CSW encouraged patient and her granddaughter to contact CSW as needed. CSW will continue to follow patient and her granddaughter for support and facilitate discharge to SNF once medically stable and insurance authorization obtained.                 Expected Discharge Plan: Skilled Nursing Facility Barriers to Discharge: Ship broker, Continued Medical Work up   Patient Goals and CMS Choice   CMS Medicare.gov Compare Post Acute Care list provided to:: Patient(Told granddaughter how to access online.)    Expected Discharge Plan and Services Expected Discharge Plan: [place] Choice: [place] Living arrangements for the past 2 months: Apartment                                      Prior Living Arrangements/Services Living arrangements for the past 2 months: Apartment Lives with:: Self Patient language and need for interpreter reviewed:: Yes(No need.) Do you feel safe going back to the place where you live?: Yes      Need for Family Participation in Patient Care: Yes (Comment) Care giver support system in place?: Yes (comment)(SNF staff.)   Criminal Activity/Legal Involvement Pertinent to Current Situation/Hospitalization: No - Comment  as needed  Activities of Daily Living Home Assistive Devices/Equipment: Walker (specify type), CBG Meter, Blood pressure cuff ADL Screening (condition at time of admission) Patient's cognitive ability adequate to safely complete daily activities?: No Is the patient deaf or have difficulty hearing?: No Does the patient have difficulty seeing, even when wearing glasses/contacts?: No Does the patient have difficulty concentrating, remembering, or making decisions?: Yes Patient able to express need for assistance with ADLs?: Yes Does the patient have difficulty dressing or bathing?: No Independently performs ADLs?: Yes (appropriate for developmental age) Does the patient have difficulty walking or climbing stairs?: Yes Weakness of Legs: Both Weakness of Arms/Hands: None  Permission Sought/Granted Permission sought to share information with : Facility Sport and exercise psychologist, Family Supports Permission granted to share information with : Yes, Verbal Permission Granted  Share Information with NAME: Elaine Mathews  Permission granted to share info w AGENCY: SNF's  Permission granted to share info w Relationship: Granddaughter  Permission granted to share info w Contact Information: [phone]  Emotional Assessment Appearance:: Appears stated age Attitude/Demeanor/Rapport: Engaged, Gracious Affect (typically observed): Accepting, Appropriate, Calm, Pleasant Orientation: : Oriented to Self, Oriented to Place, Oriented to  Time, Oriented to Situation Alcohol / Substance Use: Never Used Psych Involvement: No (comment)  Admission diagnosis:  SYNCOPE  ELEVATED TROPONIN Patient Active Problem List   Diagnosis Date Noted  . Spleen injury 07/15/2018  . Spleen hematoma without rupture of capsule, without open wound into cavity 07/15/2018  .  Syncope, vasovagal 07/15/2018  . Seizure ([place]) 07/15/2018  . Sinus pause 07/13/2018  . NSTEMI (non-ST elevated myocardial infarction) ([place]) 07/12/2018  .  Hypertensive urgency 07/12/2018  . Elevated LFTs 07/12/2018  . Paroxysmal atrial fibrillation ([place]) 11/13/2017  . Hypertension 11/13/2017  . Pre-ulcerative calluses 01/06/2017  . Controlled type 2 diabetes with neuropathy ([place]) 01/06/2017   PCP:  Elaine Johns, MD Pharmacy:   [facility] [address] [place] [address] Phone: [phone] Fax: [phone]  [place], [place] [address] Phone: [phone] Fax: [phone]     Social Determinants of Health (SDOH) Interventions    Readmission Risk Interventions No flowsheet data found.

## 2018-07-15 NOTE — Discharge Instructions (Signed)

## 2018-07-15 NOTE — Progress Notes (Signed)
[facility] PROGRESS NOTE  Elaine Mathews UEA:[id number] DOB: 09/05/1933 DOA: 07/12/2018 PCP: Lucianne Lei, MD  Assessment/Plan: 1. NSTEMI.   Troponin peak of 7.48, nonischemic s/p DES to proximal/mid LAD (6/1), continue home xarelto, added clopidogrel. Holding metoprolol and diltiazem due to #2  2. Symptomatic sinus pause. Witnessed LOC by nursing and myself on 6/2, no recurrent pauses overnight in ICU. ? Related to vasovagal given abdominal pain and syncope prior to admission. Monitored in ICU overnight given recurrent symptomatic pauses and witnessed seizure. Continue holding toprol and dilitazem. EP input needed for possible pacemaker, cardiology following   3. Syncope. Unwitnessed falls prior to admission that occur while on the way to bathroom with dizziness and controlled fall/slode to ground per patient( x 2 no LOC or head trauma or abdominal trauma)Initially thought related to NSTEMI; however sinus pause could be contributing discontinue nodal blockers(dilt, toprol), monitor on telemetry. Cardiology will discuss with EP, PT eval and treat  4. New onset seizure. Witnessed by myself and nursing on 6/2 with tonic-clonic activity and left head term and bilateral arm extension,  post ictal period with stool and urine incontinence. Loaded with keppra, no repeat seizure activity. CT head nonacute, seizure could lead to bradycardia but unclear correlation ( sinus pause--hypotension--> seizure?). Neurology following, on keppra  5. Splenic injury. Abd xr and CT abd obtained for work up of Left abdominal pain. Found to have spleen injury with small subcapsular hematoma with no sign of extravasation. Denies abdominal injury in falls prior to admission, hgb stable, close monitoring, pain control  6. Confusion and difficulty following commands. Occurred on 6/2 shortly after returning from cath, resolved now. Completely following all commands, no focal deficits, completely oriented does have history of  stroke,  no residual weakness, CT head neg for acute. Likely related to postictal stat?  7. Paroxysmal AF. Back in RVR and resumed cardizem. Holding BB given sinus pause. Already on xarelto at home Monitor on telemetry  8. Prior stroke hx. Old Right parietal infarct on CT head. No residual deficits on exam   9. Abnormal UA. No dysuria. UA(t [place]) trace Leuks, pos nitrites, 11-20 WBC. S/p levaquin at [facility]. cx pending, no symptoms here  10. Chronic diarrhea. States hx of IBS, on welchol for. No diarhea here.   11. Hypothyroidism. Continue Synthroid  12. Hypertension. Stable. Continue lisinopril. Hold BB and CCB given symptomatic sinus Pause. Presented with syncope and occurred after LHC witnessed by nursing. Concer. Initially presumed related to above but now  13. Hyperlipidemia. LDL 121. Started atorvastatin    Code Status: FULL   Family Communication: d/w grand daughter 07/15/2018  Disposition Plan:  PT recommended SNF EP may need to see her as she is bck in RVR, further plan depending on cardiology   Consultants:  Cardiology, Neurology, PCCM  Procedures:  Left heart cath, 6/1 FINDINGS: 1. Two vessel coronary artery disease with multifocal LAD (40% proximal, 70% mid, and 80 apical).  Proximal and mid LAD disease is significant by DFR (0.74).  Culpril lesion for NSTEMI is likely occluded small OM branch, which is too small for PCI. 2. Normal left ventricular systolic function with mildly elevated LVEDP (15-20 mmHg). 3. Successful DFR-guided PCI to the proximal/mid LAD using a Resolute Onyx 2.75 x 18 mm drug-eluting stent with 0% residual stenosis and TIMI-3 flow.  Antibiotics:  none (indicate start date, and stop date if known)  HPI/Subjective: No acute complains in AM, later in afternoon hs A fib, not symptomatic.  Objective: Vitals:  07/15/18 1504 07/15/18 1718  BP: 110/85 (!) 136/91  Pulse: (!) 114 78  Resp:    Temp:  97.8 F (36.6 C)  SpO2: 95% 96%     Intake/Output Summary (Last 24 hours) at 07/15/2018 1913 Last data filed at 07/15/2018 1300 Gross per 24 hour  Intake 844.1 ml  Output 1050 ml  Net -205.9 ml   Filed Weights   07/13/18 0442 07/14/18 1700 07/15/18 0509  Weight: 56.5 kg 57.4 kg 57.2 kg    Exam:   General: Elderly female, no distress  Cardiovascular: Regular rate and rhythm, no appreciable murmurs rubs or gallops, no edema  Respiratory: Normal respiratory effort on room air, normal breath sounds  Abdomen: Soft, nontender, normal bowel sounds  Musculoskeletal: No range of motion  Skin no rashes or lesions  Neurologic alert is to person, place, time, context.  Moving all extremities on command. Strength 5/5. No focal deficits noted  Data Reviewed: Basic Metabolic Panel: Recent Labs  Lab 07/13/18 0811 07/13/18 2048 07/14/18 0746  NA 132* 133* 135  K 3.5 3.6 3.2*  CL 99 99 101  CO2 21* 24 23  GLUCOSE 138* 150* 113*  BUN 7* 7* 7*  CREATININE 0.65 0.56 0.72  CALCIUM 8.7* 8.9 8.7*  MG  --  1.6* 2.5*  PHOS  --  3.7 3.5   Liver Function Tests: Recent Labs  Lab 07/13/18 0811 07/13/18 2048  AST 92* 78*  ALT 38 38  ALKPHOS 58 58  BILITOT 0.9 1.0  PROT 6.4* 6.9  ALBUMIN 3.4* 3.6   Recent Labs  Lab 07/13/18 2048  LIPASE 21  AMYLASE 12*   Recent Labs  Lab 07/13/18 2048  AMMONIA 14   CBC: Recent Labs  Lab 07/13/18 0811 07/13/18 2048 07/14/18 0746 07/15/18 0503  WBC 5.7 7.7 6.4 5.3  NEUTROABS  --  6.1 4.4  --   HGB 10.7* 11.3* 10.6* 10.1*  HCT 32.0* 33.9* 31.8* 30.2*  MCV 89.9 90.2 89.8 89.9  PLT 122* 121* 121* 117*   Cardiac Enzymes: Recent Labs  Lab 07/12/18 1959 07/13/18 0234 07/13/18 0811  TROPONINI 4.12* 7.48* 7.17*   BNP (last 3 results) No results for input(s): BNP in the last 8760 hours.  ProBNP (last 3 results) No results for input(s): PROBNP in the last 8760 hours.  CBG: Recent Labs  Lab 07/14/18 2110 07/15/18 0516 07/15/18 0612 07/15/18 1059  07/15/18 1555  GLUCAP 95 115* 154* 143* 156*    Recent Results (from the past 240 hour(s))  Culture, Urine     Status: None   Collection Time: 07/13/18  6:48 AM  Result Value Ref Range Status   Specimen Description URINE, RANDOM  Final   Special Requests NONE  Final   Culture   Final    NO GROWTH Performed at [facility], [address] [place], [place] [address]    Report Status 07/14/2018 FINAL  Final  MRSA PCR Screening     Status: None   Collection Time: 07/14/18  9:18 AM  Result Value Ref Range Status   MRSA by PCR NEGATIVE NEGATIVE Final    Comment:        The GeneXpert MRSA Assay (FDA approved for NASAL specimens only), is one component of a comprehensive MRSA colonization surveillance program. It is not intended to diagnose MRSA infection nor to guide or monitor treatment for MRSA infections. Performed at [facility], [address] [place], [place] [address]      Studies:  Ct Head Wo Contrast  Result Date: 07/14/2018 CLINICAL DATA:  New onset seizure EXAM: CT HEAD WITHOUT CONTRAST TECHNIQUE: Contiguous axial images were obtained from the base of the skull through the vertex without intravenous contrast. COMPARISON:  Head CT 07/12/2018 FINDINGS: Brain: There is no mass, hemorrhage or extra-axial collection. There is generalized atrophy without lobar predilection. Hypodensity of the white matter is most commonly associated with chronic microvascular disease. There is an old left parietal infarct, unchanged. Vascular: No abnormal hyperdensity of the major intracranial arteries or dural venous sinuses. No intracranial atherosclerosis. Skull: The visualized skull base, calvarium and extracranial soft tissues are normal. Sinuses/Orbits: Partial left fronto ethmoid opacification. No mastoid or middle ear effusion. The orbits are normal. IMPRESSION: 1. No acute intracranial abnormality. 2. Chronic small vessel ischemia and old right parietal infarct. Electronically  Signed   By: Deatra Robinson M.D.   On: 07/14/2018 01:26   Ct Abdomen Pelvis W Contrast  Result Date: 07/14/2018 CLINICAL DATA:  85 year old female with abdominal pain and fever. Concern for abscess. EXAM: CT ABDOMEN AND PELVIS WITH CONTRAST TECHNIQUE: Multidetector CT imaging of the abdomen and pelvis was performed using the standard protocol following bolus administration of intravenous contrast. CONTRAST:  OMNIPAQUE IOHEXOL 300 MG/ML  SOLN COMPARISON:  CT of the abdomen pelvis dated 09/01/2016 FINDINGS: Evaluation of this exam is limited due to respiratory motion artifact. Lower chest: The visualized lung bases are clear. There is coronary vascular calcification as well as calcification of the mitral annulus. No intra-abdominal free air or free fluid. Hepatobiliary: The liver is unremarkable. No intrahepatic biliary ductal dilatation. Cholecystectomy. There is mild dilatation of the central CBD, likely post cholecystectomy. No calcified stone noted in the central CBD. Pancreas: Unremarkable. No pancreatic ductal dilatation or surrounding inflammatory changes. Spleen: There is a focal area of hypoenhancement in the mid to upper pole of the spleen measuring up to 22 x 18 mm likely an area of splenic infarct or laceration. There is a small amount of slightly higher attenuating fluid along the lateral aspect of the spleen which may represent a subacute perisplenic or subcapsular hematoma. No extravasation of contrast to suggest active bleed. Adrenals/Urinary Tract: The adrenal glands are unremarkable. There is no hydronephrosis on either side. Areas of renal cortical scarring noted bilaterally. There is symmetric enhancement and excretion of contrast by both kidneys. The visualized ureters appear unremarkable. Excreted contrast is noted within the urinary bladder. Evaluation of the bladder is somewhat limited due to presence of high attenuating contrast. Stomach/Bowel: There is sigmoid diverticulosis without  active inflammatory changes. Evaluation of the bowel is limited due to respiratory motion artifact. There is no bowel obstruction or active inflammation. Appendectomy. Vascular/Lymphatic: Advanced aortoiliac atherosclerotic disease. No portal venous gas. There is no adenopathy. Reproductive: The uterus and ovaries are grossly unremarkable. Other: None Musculoskeletal: Osteopenia with degenerative changes of the spine. No acute osseous pathology. IMPRESSION: 1. Probable focal area of traumatic splenic injury/laceration with a small perisplenic or subcapsular hematoma. No extravasation of contrast or evidence of active bleed. 2. Sigmoid diverticulosis. No bowel obstruction or active inflammation. Electronically Signed   By: Elgie Collard M.D.   On: 07/14/2018 01:33   Dg Abd Portable 2v  Result Date: 07/13/2018 CLINICAL DATA:  85 year old female with abdominal pain. Altered mental status. EXAM: PORTABLE ABDOMEN - 2 VIEW COMPARISON:  CT of the abdomen pelvis dated 09/01/2016 FINDINGS: There is no bowel dilatation or evidence of obstruction. No free air identified. Excreted intravenous contrast in the renal collecting  system and bladder noted. Right upper quadrant cholecystectomy clips. There is degenerative changes of the spine. No acute osseous pathology. IMPRESSION: No acute findings by radiograph. Electronically Signed   By: Elgie Collard M.D.   On: 07/13/2018 19:51    Scheduled Meds: . atorvastatin  40 mg Oral q1800  . clopidogrel  75 mg Oral Q breakfast  . diltiazem  180 mg Oral Daily  . insulin aspart  0-9 Units Subcutaneous TID WC & HS  . levETIRAcetam  500 mg Oral BID  . levothyroxine  75 mcg Oral Q0600  . lisinopril  40 mg Oral QHS  . rivaroxaban  20 mg Oral Q supper  . sodium chloride flush  3 mL Intravenous Q12H  . sodium chloride flush  3 mL Intravenous Q12H   Continuous Infusions: . sodium chloride Stopped (07/13/18 2039)  . nitroGLYCERIN 5 mcg/min (07/13/18 1630)       Author:  Lynden Oxford, MD [facility] 07/15/2018   To reach On-call, see care teams to locate the attending and reach out to them via [email or web address]. If 7PM-7AM, please contact night-coverage If you still have difficulty reaching the attending provider, please page the [facility] (Director on Call) for [facility] on amion for assistance.

## 2018-07-15 NOTE — Progress Notes (Signed)
CCMD called to inquire in HR still elevated in afib, hr 120's, pt asleep and no distress noted, advised cardiology is following and they are being cautious as she was admitted with syncope and had sinus pauses on admit, they restarted her on po dilitiazem and will monitor

## 2018-07-15 NOTE — Progress Notes (Signed)
[facility] PROGRESS NOTE  INESSA WARDROP ZOX:[id number] DOB: 10-16-1933 DOA: 07/12/2018 PCP: Lucianne Lei, MD  Assessment/Plan: 1. NSTEMI.   Troponin peak of 7.48, nonischemic s/p DES to proximal/mid LAD (6/1), continue home xarelto, added clopidogrel. Holding metoprolol and diltiazem due to #2  2. Symptomatic sinus pause. Witnessed LOC by nursing and myself on 6/2, no recurrent pauses overnight in ICU. ? Related to vasovagal given abdominal pain and syncope prior to admission. Monitored in ICU overnight given recurrent symptomatic pauses and witnessed seizure. Continue holding toprol and dilitazem. EP input needed for possible pacemaker, cardiology following   3. Syncope. Unwitnessed falls prior to admission that occur while on the way to bathroom with dizziness and controlled fall/slode to ground per patient( x 2 no LOC or head trauma or abdominal trauma)Initially thought related to NSTEMI; however sinus pause could be contributing discontinue nodal blockers(dilt, toprol), monitor on telemetry. Cardiology will discuss with EP, PT eval and treat  4. New onset seizure. Witnessed by myself and nursing on 6/2 with tonic-clonic activity and left head term and bilateral arm extension,  post ictal period with stool and urine incontinence. Loaded with keppra, no repeat seizure activity. CT head nonacute, seizure could lead to bradycardia but unclear correlation ( sinus pause--hypotension--> seizure?). Neurology following, on keppra  5. Splenic injury. Abd xr and CT abd obtained for work up of Left abdominal pain. Found to have spleen injury with small subcapsular hematoma with no sign of extravasation. Denies abdominal injury in falls prior to admission, hgb stable, close monitoring, pain control  6. Confusion and difficulty following commands. Occurred on 6/2 shortly after returning from cath, resolved now. Completely following all commands, no focal deficits, completely oriented does have history of  stroke,  no residual weakness, CT head neg for acute. Likely related to postictal stat?  7. Paroxysmal AF. NSR currently. Holding dilt and BB given sinus pause. Already on xarelto at home Monitor on telemetry  8. Prior stroke hx. Old Right parietal infarct on CT head. No residual deficits on exam   9. Abnormal UA. No dysuria. UA(t Fleming) trace Leuks, pos nitrites, 11-20 WBC. S/p levaquin at [facility]. cx pending, no symptoms here  10. Chronic diarrhea. States hx of IBS, on welchol for. No diarhea here.   11. Hypothyroidism. Continue Synthroid  12. Hypertension. Stable. Continue lisinopril. Hold BB and CCB given symptomatic sinus Pause. Presented with syncope and occurred after LHC witnessed by nursing. Concer. Initially presumed related to above but now  13. Hyperlipidemia. LDL 121. Started atorvastatin    Code Status: FULL   Family Communication: Spoke with son Elmer Picker at [phone] on 6/2 ( made aware of transfer to ICU at the time) Disposition Plan:  Transfer out of ICU to telemetry given no recurrent sinus pauses or seizures,monitor hgb and abdominal pain given spleen injury, , on anticoagulation for NSTEMI, need EP input regarding sinus pause, neurology following, PT/OT given syncope  Consultants:  Cardiology, Neurology, PCCM  Procedures:  Left heart cath, 6/1 FINDINGS: 1. Two vessel coronary artery disease with multifocal LAD (40% proximal, 70% mid, and 80 apical).  Proximal and mid LAD disease is significant by DFR (0.74).  Culpril lesion for NSTEMI is likely occluded small OM branch, which is too small for PCI. 2. Normal left ventricular systolic function with mildly elevated LVEDP (15-20 mmHg). 3. Successful DFR-guided PCI to the proximal/mid LAD using a Resolute Onyx 2.75 x 18 mm drug-eluting stent with 0% residual stenosis and TIMI-3 flow.  Antibiotics:  none (  indicate start date, and stop date if known)  HPI/Subjective:  TAVIONA TRIBE is a 85 y.o. year  old female with medical history significant for prior stroke (at [place]), PAF on xarelto, HTN, HLD, who presented to [facility] ED on 07/12/2018 with sudden diaphoresis, no shoulder pain, nausea followed by weakness fall (reported no loss of consciousness/head trauma) and was found to have elevated troponin ( 0.24-0.93) and EKG with old RBBB which prompted transfer to [facility] cone for further management for NSTEMI. Hospital course complicated by development of sinus pauses short after cath on 6/2 with witnessed seizure like activity ( with postictal period, urine/stool incontinence) requiring Keppra load and observation in ICU overnight in case of inability to protect airway.  Incidentally found to have spleen injury in workup of abdominal pain and concern for vasovagal syncope.   Doesn't remember much of yesterday Very apologetic  Objective: Vitals:   07/14/18 1929 07/14/18 2350  BP: 137/80 132/70  Pulse: 92   Resp: 18 18  Temp: 98 F (36.7 C) (!) 97.2 F (36.2 C)  SpO2: 96% 97%    Intake/Output Summary (Last 24 hours) at 07/15/2018 0055 Last data filed at 07/14/2018 1700 Gross per 24 hour  Intake 1556.97 ml  Output 600 ml  Net 956.97 ml   Filed Weights   07/12/18 1856 07/13/18 0442 07/14/18 1700  Weight: 57 kg 56.5 kg 57.4 kg    Exam:   General: Elderly female, no distress  Cardiovascular: Regular rate and rhythm, no appreciable murmurs rubs or gallops, no edema  Respiratory: Normal respiratory effort on room air, normal breath sounds  Abdomen: Soft, nontender, normal bowel sounds  Musculoskeletal: No range of motion  Skin no rashes or lesions  Neurologic alert is to person, place, time, context.  Moving all extremities on command. Strength 5/5. No focal deficits noted  Data Reviewed: Basic Metabolic Panel: Recent Labs  Lab 07/13/18 0811 07/13/18 2048 07/14/18 0746  NA 132* 133* 135  K 3.5 3.6 3.2*  CL 99 99 101  CO2 21* 24 23  GLUCOSE 138* 150* 113*  BUN  7* 7* 7*  CREATININE 0.65 0.56 0.72  CALCIUM 8.7* 8.9 8.7*  MG  --  1.6* 2.5*  PHOS  --  3.7 3.5   Liver Function Tests: Recent Labs  Lab 07/13/18 0811 07/13/18 2048  AST 92* 78*  ALT 38 38  ALKPHOS 58 58  BILITOT 0.9 1.0  PROT 6.4* 6.9  ALBUMIN 3.4* 3.6   Recent Labs  Lab 07/13/18 2048  LIPASE 21  AMYLASE 12*   Recent Labs  Lab 07/13/18 2048  AMMONIA 14   CBC: Recent Labs  Lab 07/13/18 0811 07/13/18 2048 07/14/18 0746  WBC 5.7 7.7 6.4  NEUTROABS  --  6.1 4.4  HGB 10.7* 11.3* 10.6*  HCT 32.0* 33.9* 31.8*  MCV 89.9 90.2 89.8  PLT 122* 121* 121*   Cardiac Enzymes: Recent Labs  Lab 07/12/18 1959 07/13/18 0234 07/13/18 0811  TROPONINI 4.12* 7.48* 7.17*   BNP (last 3 results) No results for input(s): BNP in the last 8760 hours.  ProBNP (last 3 results) No results for input(s): PROBNP in the last 8760 hours.  CBG: Recent Labs  Lab 07/14/18 0432 07/14/18 0758 07/14/18 1135 07/14/18 1633 07/14/18 2110  GLUCAP 123* 108* 109* 81 95    Recent Results (from the past 240 hour(s))  Culture, Urine     Status: None   Collection Time: 07/13/18  6:48 AM  Result Value Ref Range Status  Specimen Description URINE, RANDOM  Final   Special Requests NONE  Final   Culture   Final    NO GROWTH Performed at [facility], [address] [place], [place] [address]    Report Status 07/14/2018 FINAL  Final  MRSA PCR Screening     Status: None   Collection Time: 07/14/18  9:18 AM  Result Value Ref Range Status   MRSA by PCR NEGATIVE NEGATIVE Final    Comment:        The GeneXpert MRSA Assay (FDA approved for NASAL specimens only), is one component of a comprehensive MRSA colonization surveillance program. It is not intended to diagnose MRSA infection nor to guide or monitor treatment for MRSA infections. Performed at [facility], [address], [place], [place] [address]      Studies: Ct Head Wo Contrast  Result Date:  07/14/2018 CLINICAL DATA:  New onset seizure EXAM: CT HEAD WITHOUT CONTRAST TECHNIQUE: Contiguous axial images were obtained from the base of the skull through the vertex without intravenous contrast. COMPARISON:  Head CT 07/12/2018 FINDINGS: Brain: There is no mass, hemorrhage or extra-axial collection. There is generalized atrophy without lobar predilection. Hypodensity of the white matter is most commonly associated with chronic microvascular disease. There is an old left parietal infarct, unchanged. Vascular: No abnormal hyperdensity of the major intracranial arteries or dural venous sinuses. No intracranial atherosclerosis. Skull: The visualized skull base, calvarium and extracranial soft tissues are normal. Sinuses/Orbits: Partial left fronto ethmoid opacification. No mastoid or middle ear effusion. The orbits are normal. IMPRESSION: 1. No acute intracranial abnormality. 2. Chronic small vessel ischemia and old right parietal infarct. Electronically Signed   By: Deatra Robinson M.D.   On: 07/14/2018 01:26   Ct Abdomen Pelvis W Contrast  Result Date: 07/14/2018 CLINICAL DATA:  86 year old female with abdominal pain and fever. Concern for abscess. EXAM: CT ABDOMEN AND PELVIS WITH CONTRAST TECHNIQUE: Multidetector CT imaging of the abdomen and pelvis was performed using the standard protocol following bolus administration of intravenous contrast. CONTRAST:  OMNIPAQUE IOHEXOL 300 MG/ML  SOLN COMPARISON:  CT of the abdomen pelvis dated 09/01/2016 FINDINGS: Evaluation of this exam is limited due to respiratory motion artifact. Lower chest: The visualized lung bases are clear. There is coronary vascular calcification as well as calcification of the mitral annulus. No intra-abdominal free air or free fluid. Hepatobiliary: The liver is unremarkable. No intrahepatic biliary ductal dilatation. Cholecystectomy. There is mild dilatation of the central CBD, likely post cholecystectomy. No calcified stone noted in the  central CBD. Pancreas: Unremarkable. No pancreatic ductal dilatation or surrounding inflammatory changes. Spleen: There is a focal area of hypoenhancement in the mid to upper pole of the spleen measuring up to 22 x 18 mm likely an area of splenic infarct or laceration. There is a small amount of slightly higher attenuating fluid along the lateral aspect of the spleen which may represent a subacute perisplenic or subcapsular hematoma. No extravasation of contrast to suggest active bleed. Adrenals/Urinary Tract: The adrenal glands are unremarkable. There is no hydronephrosis on either side. Areas of renal cortical scarring noted bilaterally. There is symmetric enhancement and excretion of contrast by both kidneys. The visualized ureters appear unremarkable. Excreted contrast is noted within the urinary bladder. Evaluation of the bladder is somewhat limited due to presence of high attenuating contrast. Stomach/Bowel: There is sigmoid diverticulosis without active inflammatory changes. Evaluation of the bowel is limited due to respiratory motion artifact. There is no bowel  obstruction or active inflammation. Appendectomy. Vascular/Lymphatic: Advanced aortoiliac atherosclerotic disease. No portal venous gas. There is no adenopathy. Reproductive: The uterus and ovaries are grossly unremarkable. Other: None Musculoskeletal: Osteopenia with degenerative changes of the spine. No acute osseous pathology. IMPRESSION: 1. Probable focal area of traumatic splenic injury/laceration with a small perisplenic or subcapsular hematoma. No extravasation of contrast or evidence of active bleed. 2. Sigmoid diverticulosis. No bowel obstruction or active inflammation. Electronically Signed   By: Elgie Collard M.D.   On: 07/14/2018 01:33   Dg Abd Portable 2v  Result Date: 07/13/2018 CLINICAL DATA:  85 year old female with abdominal pain. Altered mental status. EXAM: PORTABLE ABDOMEN - 2 VIEW COMPARISON:  CT of the abdomen pelvis dated  09/01/2016 FINDINGS: There is no bowel dilatation or evidence of obstruction. No free air identified. Excreted intravenous contrast in the renal collecting system and bladder noted. Right upper quadrant cholecystectomy clips. There is degenerative changes of the spine. No acute osseous pathology. IMPRESSION: No acute findings by radiograph. Electronically Signed   By: Elgie Collard M.D.   On: 07/13/2018 19:51    Scheduled Meds: . atorvastatin  40 mg Oral q1800  . Chlorhexidine Gluconate Cloth  6 each Topical Daily  . clopidogrel  75 mg Oral Q breakfast  . insulin aspart  0-9 Units Subcutaneous Q4H  . levothyroxine  75 mcg Oral Q0600  . lisinopril  40 mg Oral QHS  . rivaroxaban  20 mg Oral Q supper  . sodium chloride flush  3 mL Intravenous Q12H  . sodium chloride flush  3 mL Intravenous Q12H   Continuous Infusions: . sodium chloride Stopped (07/13/18 2039)  . lactated ringers 50 mL/hr at 07/14/18 1800  . levETIRAcetam 500 mg (07/14/18 2147)  . nitroGLYCERIN 5 mcg/min (07/13/18 1630)    Principal Problem:   NSTEMI (non-ST elevated myocardial infarction) (HCC) Active Problems:   Paroxysmal atrial fibrillation (HCC)   Hypertension   Controlled type 2 diabetes with neuropathy (HCC)   Hypertensive urgency   Elevated LFTs   Sinus pause      Laverna Peace  [facility]

## 2018-07-15 NOTE — Progress Notes (Signed)
   07/15/18 1509  MEWS Assessment  Is this an acute change? Yes  Provider Notification  Provider Name/Title Dr Allena Katz  Date Provider Notified 07/15/18  Time Provider Notified 1510  Notification Type Page  Notification Reason Change in status

## 2018-07-15 NOTE — Evaluation (Signed)
Physical Therapy Evaluation Patient Details Name: Elaine Mathews MRN: [id number] DOB: Jul 10, 1933 Today's Date: 07/15/2018   History of Present Illness  Pt is an 85 year old woman admitted from [facility] with NSTEMI on 07/12/18. Hospital course complicated by seizure on 07/14/18 and incidental finding of splenic injury. PMH: CVA, HTN, afib, IBS, hypothyroidism.  Clinical Impression  PTA pt living independently alone in one level apartment, with occasional assist from grandchildren. Pt is limited in safe mobility by decreased safety awareness, trouble sequencing, decreased coordination, and decreased balance. Pt repeating that she "wants to go to heaven." thorough out ambulation. Pt min guard for bed mobility, modA for transfers and min A for ambulation in hallway. PT recommending SNF level rehab at discharge. PT will continue to follow acutely.     Follow Up Recommendations SNF    Equipment Recommendations  None recommended by PT    Recommendations for Other Services       Precautions / Restrictions Precautions Precautions: Fall Restrictions Weight Bearing Restrictions: No      Mobility  Bed Mobility Overal bed mobility: Needs Assistance Bed Mobility: Supine to Sit     Supine to sit: Min guard     General bed mobility comments: min guard for safety, increased time and effort required  Transfers Overall transfer level: Needs assistance Equipment used: 1 person hand held assist;Rolling walker (2 wheeled) Transfers: Stand Pivot Transfers;Sit to/from Stand Sit to Stand: Min assist Stand pivot transfers: Mod assist       General transfer comment: min A for sit to stand, modA and increased verbal and tactile cuing to pivot to [facility] with reaching with L hand to left handrail of BSC.   Ambulation/Gait Ambulation/Gait assistance: Min assist Gait Distance (Feet): 60 Feet Assistive device: Rolling walker (2 wheeled) Gait Pattern/deviations: Step-through pattern;Decreased step  length - right;Decreased step length - left;Trunk flexed Gait velocity: slowed Gait velocity interpretation: <1.8 ft/sec, indicate of risk for recurrent falls General Gait Details: minA for steadying, increased verbal and tactile cuing for opening L hand to grab handle of RW, cuing for continued forward progress and keeping RW on floor          Balance Overall balance assessment: Needs assistance Sitting-balance support: No upper extremity supported;Feet supported Sitting balance-Leahy Scale: Fair     Standing balance support: During functional activity;Single extremity supported;No upper extremity supported Standing balance-Leahy Scale: Fair Standing balance comment: statically at sink                             Pertinent Vitals/Pain Pain Assessment: Faces Faces Pain Scale: No hurt    Home Living Family/patient expects to be discharged to:: Private residence Living Arrangements: Alone Available Help at Discharge: Family;Available PRN/intermittently(grandchildren) Type of Home: Apartment Home Access: Stairs to enter   Entrance Stairs-Number of Steps: 1 Home Layout: One level Home Equipment: Walker - 4 wheels      Prior Function Level of Independence: Independent         Comments: reports she has intermittent assist of her grandson, later said granddaughter helps her at times     Hand Dominance   Dominant Hand: Right    Extremity/Trunk Assessment   Upper Extremity Assessment Upper Extremity Assessment: LUE deficits/detail LUE Deficits / Details: incoordination, assistance needed to grasp walker handle, confused grasping PT's hand with walker handle LUE Sensation: decreased proprioception LUE Coordination: decreased fine motor;decreased gross motor    Lower Extremity Assessment Lower Extremity  Assessment: Defer to PT evaluation       Communication   Communication: No difficulties  Cognition Arousal/Alertness: Awake/alert Behavior During  Therapy: Impulsive;Restless;Anxious Overall Cognitive Status: Impaired/Different from baseline Area of Impairment: Following commands;Safety/judgement;Awareness;Problem solving;Attention                   Current Attention Level: Selective   Following Commands: Follows multi-step commands inconsistently;Follows one step commands inconsistently;Follows multi-step commands with increased time;Follows one step commands with increased time Safety/Judgement: Decreased awareness of safety;Decreased awareness of deficits Awareness: Emergent Problem Solving: Slow processing;Decreased initiation;Difficulty sequencing;Requires verbal cues;Requires tactile cues General Comments: pt labile, repeats she is going to heaven, wants to go soon       General Comments General comments (skin integrity, edema, etc.): VSS    Exercises     Assessment/Plan    PT Assessment Patient needs continued PT services  PT Problem List Decreased safety awareness;Decreased cognition;Decreased strength;Decreased balance       PT Treatment Interventions DME instruction;Gait training;Functional mobility training;Therapeutic activities;Stair training;Therapeutic exercise;Balance training;Cognitive remediation;Patient/family education    PT Goals (Current goals can be found in the Care Plan section)  Acute Rehab PT Goals Patient Stated Goal: to go to heaven PT Goal Formulation: With patient Time For Goal Achievement: 07/29/18 Potential to Achieve Goals: Fair    Frequency Min 2X/week   Barriers to discharge Decreased caregiver support      Co-evaluation   Reason for Co-Treatment: Necessary to address cognition/behavior during functional activity   OT goals addressed during session: ADL's and self-care       AM-PAC PT "6 Clicks" Mobility  Outcome Measure Help needed turning from your back to your side while in a flat bed without using bedrails?: None Help needed moving from lying on your back to  sitting on the side of a flat bed without using bedrails?: None Help needed moving to and from a bed to a chair (including a wheelchair)?: A Little Help needed standing up from a chair using your arms (e.g., wheelchair or bedside chair)?: A Little Help needed to walk in hospital room?: A Little Help needed climbing 3-5 steps with a railing? : A Lot 6 Click Score: 19    End of Session Equipment Utilized During Treatment: Gait belt Activity Tolerance: Patient tolerated treatment well Patient left: in chair;with call bell/phone within reach;with chair alarm set Nurse Communication: Mobility status PT Visit Diagnosis: Other abnormalities of gait and mobility (R26.89);Difficulty in walking, not elsewhere classified (R26.2)    Time: 6010-9323 PT Time Calculation (min) (ACUTE ONLY): 24 min   Charges:   PT Evaluation $PT Eval Moderate Complexity: 1 Mod          Julea Hutto B. Beverely Risen PT, DPT Acute Rehabilitation Services Pager [phone] Office [phone]   Elon Alas Fleet 07/15/2018, 2:20 PM

## 2018-07-15 NOTE — Progress Notes (Signed)
    Called by RN that patient converted into Afib RVR rates 130s. Asymptomatic. Hx of the same. Currently on triple therapy with ASA, plavix and Xarelto. BB and CCB held on admission. Previous note by Dr. Okey Dupre noted sinus arrest post cath on 6/1, but has been in SR since without further arrhthymias or pauses noted. Discussed with attending and will restart home Dilt and observe on telemetry overnight. May warrant an EP consult in the am.   Signed, Laverda Page, NP-C 07/15/2018, 4:43 PM Pager: [phone]

## 2018-07-15 NOTE — Progress Notes (Signed)
Came to ambulate this pm but appears pt is in afib, rate 120-130s while asleep. Notified RN. Will hold ambulation for now. She was seen by PT/OT earlier this am and was not in afib then. [id number] Ethelda Chick CES, ACSM 2:57 PM 07/15/2018

## 2018-07-16 ENCOUNTER — Inpatient Hospital Stay (HOSPITAL_COMMUNITY): Payer: Medicare Other

## 2018-07-16 LAB — GLUCOSE, CAPILLARY
Glucose-Capillary: 121 mg/dL — ABNORMAL HIGH (ref 70–99)
Glucose-Capillary: 125 mg/dL — ABNORMAL HIGH (ref 70–99)
Glucose-Capillary: 158 mg/dL — ABNORMAL HIGH (ref 70–99)
Glucose-Capillary: 132 mg/dL — ABNORMAL HIGH (ref 70–99)
Glucose-Capillary: 124 mg/dL — ABNORMAL HIGH (ref 70–99)

## 2018-07-16 LAB — BASIC METABOLIC PANEL
Anion gap: 11 (ref 5–15)
BUN: 10 mg/dL (ref 8–23)
CO2: 23 mmol/L (ref 22–32)
Calcium: 8.8 mg/dL — ABNORMAL LOW (ref 8.9–10.3)
Chloride: 99 mmol/L (ref 98–111)
Creatinine, Ser: 0.74 mg/dL (ref 0.44–1.00)
GFR calc Af Amer: >60 mL/min (ref >60)
GFR calc non Af Amer: >60 mL/min (ref >60)
Glucose, Bld: 128 mg/dL — ABNORMAL HIGH (ref 70–99)
Potassium: 4.2 mmol/L (ref 3.5–5.1)
Sodium: 133 mmol/L — ABNORMAL LOW (ref 135–145)

## 2018-07-16 LAB — CBC
HCT: 30.7 % — ABNORMAL LOW (ref 36.0–46.0)
Hemoglobin: 10.2 g/dL — ABNORMAL LOW (ref 12.0–15.0)
MCH: 29.7 pg (ref 26.0–34.0)
MCHC: 33.2 g/dL (ref 30.0–36.0)
MCV: 89.5 fL (ref 80.0–100.0)
Platelets: 110 10*3/uL — ABNORMAL LOW (ref 150–400)
RBC: 3.43 MIL/uL — ABNORMAL LOW (ref 3.87–5.11)
RDW: 13.6 % (ref 11.5–15.5)
WBC: 6 10*3/uL (ref 4.0–10.5)
nRBC: 0 % (ref 0.0–0.2)

## 2018-07-16 NOTE — Progress Notes (Signed)
EEG completed, results pending. 

## 2018-07-16 NOTE — Procedures (Signed)
ELECTROENCEPHALOGRAM REPORT   Patient: Elaine Mathews       Room #: 3E23C EEG No. ID: [id number] Age: 85 y.o.        Sex: female Referring Physician: patel Report Date:  07/16/2018        Interpreting Physician: Thana Farr  History: Elaine Mathews is an 85 y.o. female with syncope and seizure-like activity  Medications:  Lipitor, Plavix, Cardizem, Insulin, Synthroid, Zestril, Xarelto  Conditions of Recording:  This is a 21 channel routine scalp EEG performed with bipolar and monopolar montages arranged in accordance to the international 10/20 system of electrode placement. One channel was dedicated to EKG recording.  The patient is in the awake and drowsy states.  Description:  Artifact is prominent during the recording often obscuring the background rhythm. When able to be visualized the patient does appear to be able to achieve an alpha posterior background rhythm but this is rare and poorly sustained with slower rhythms more commonly seen.   The patient drowses with slowing to irregular, low voltage theta and beta activity.   Stage II sleep is not obtained. No epileptiform activity is noted.   Hyperventilation and intermittent photic stimulation were not performed.  IMPRESSION: This is a technically difficult record due to the predominance of artifact.  There appears to be some evidence of normal drowse.  No epileptiform activity is noted.     Thana Farr, MD Neurology [phone] 07/16/2018, 12:05 PM

## 2018-07-16 NOTE — Progress Notes (Signed)
Progress Note  Patient Name: Elaine Mathews Date of Encounter: 07/16/2018  Primary Cardiologist: Revankar  Subjective   No chest pain or dyspnea this am.   Inpatient Medications    Scheduled Meds: . atorvastatin  40 mg Oral q1800  . clopidogrel  75 mg Oral Q breakfast  . diltiazem  180 mg Oral Daily  . insulin aspart  0-9 Units Subcutaneous TID WC & HS  . levETIRAcetam  500 mg Oral BID  . levothyroxine  75 mcg Oral Q0600  . lisinopril  40 mg Oral QHS  . rivaroxaban  20 mg Oral Q supper  . sodium chloride flush  3 mL Intravenous Q12H  . sodium chloride flush  3 mL Intravenous Q12H   Continuous Infusions: . sodium chloride Stopped (07/13/18 2039)  . nitroGLYCERIN 5 mcg/min (07/13/18 1630)   PRN Meds: sodium chloride, acetaminophen, ondansetron (ZOFRAN) IV, sodium chloride flush   Vital Signs    Vitals:   07/15/18 1934 07/16/18 0528 07/16/18 0528 07/16/18 0801  BP: (!) 129/57  (!) 108/54 118/70  Pulse: (!) 116  69 94  Resp: 18  18 18   Temp:   98.3 F (36.8 C) 98 F (36.7 C)  TempSrc:   Oral Oral  SpO2: 95%  92% 100%  Weight:  56.2 kg    Height:        Intake/Output Summary (Last 24 hours) at 07/16/2018 0905 Last data filed at 07/16/2018 0753 Gross per 24 hour  Intake 840 ml  Output 1350 ml  Net -510 ml   Last 3 Weights 07/16/2018 07/15/2018 07/14/2018  Weight (lbs) 123 lb 14.4 oz 126 lb 1.7 oz 126 lb 8.7 oz  Weight (kg) 56.2 kg 57.2 kg 57.4 kg      Telemetry    Atrial fib, rate 90-100 bpm - Personally Reviewed  ECG    No AM EKG: Personally Reviewed  Physical Exam   General: Well developed, well nourished, NAD  HEENT: OP clear, mucus membranes moist  SKIN: warm, dry. No rashes. Neuro: No focal deficits  Musculoskeletal: Muscle strength 5/5 all ext  Psychiatric: Mood and affect normal  Neck: No JVD, no carotid bruits, no thyromegaly, no lymphadenopathy.  Lungs:Clear bilaterally, no wheezes, rhonci, crackles Cardiovascular: Irregular irregular. No  murmurs, gallops or rubs. Abdomen:Soft. Bowel sounds present. Non-tender.  Extremities: No lower extremity edema. Pulses are 2 + in the bilateral DP/PT.  Labs    Chemistry Recent Labs  Lab 07/13/18 [phone] 07/13/18 2048 07/14/18 0746 07/16/18 0638  NA 132* 133* 135 133*  K 3.5 3.6 3.2* 4.2  CL 99 99 101 99  CO2 21* 24 23 23   GLUCOSE 138* 150* 113* 128*  BUN 7* 7* 7* 10  CREATININE 0.65 0.56 0.72 0.74  CALCIUM 8.7* 8.9 8.7* 8.8*  PROT 6.4* 6.9  --   --   ALBUMIN 3.4* 3.6  --   --   AST 92* 78*  --   --   ALT 38 38  --   --   ALKPHOS 58 58  --   --   BILITOT 0.9 1.0  --   --   GFRNONAA >60 >60 >60 >60  GFRAA >60 >60 >60 >60  ANIONGAP 12 10 11 11      Hematology Recent Labs  Lab 07/14/18 0746 07/15/18 0503 07/16/18 0444  WBC 6.4 5.3 6.0  RBC 3.54* 3.36* 3.43*  HGB 10.6* 10.1* 10.2*  HCT 31.8* 30.2* 30.7*  MCV 89.8 89.9 89.5  MCH 29.9 30.1 29.7  MCHC 33.3 33.4 33.2  RDW 13.6 13.4 13.6  PLT 121* 117* 110*    Cardiac Enzymes Recent Labs  Lab 07/12/18 1959 07/13/18 0234 07/13/18 0811  TROPONINI 4.12* 7.48* 7.17*   No results for input(s): TROPIPOC in the last 168 hours.   BNPNo results for input(s): BNP, PROBNP in the last 168 hours.   DDimer No results for input(s): DDIMER in the last 168 hours.   Radiology    No results found.  Cardiac Studies     Patient Profile     85 y.o. female with a past medical history significant for paroxysmal atrial fibrillation (supposed to be on Xarelto), prior CVA, uncontrolled hypertension, diabetes mellitus, hyperlipidemia, hypothyroidism, and vitamin D deficiency who presented to the hospital after a syncopal episode. Cardiac cath 07/13/18 with severe stenosis LAD treated with a DES. Metoprolol and Cardizem held due to sinus pauses.   Assessment & Plan    1. CAD/NSTEMI in the setting of near syncope: No chest pain. She is s/p placement of a drug eluting stent in the LAD 07/13/18. Her metoprolol and Cardizem were held due  to a 25 second sinus arrest likely due to a vasovagal episode post sheath pull. She had onset of atrial fib with RVR yesterday. Cardizem was restarted. Will continue Plavix, statin, Cardizem and Xarelto.   2. HTN: BP is well controlled.   3. PAF/sinus pauses: Heart rate 90-100 bpm in atrial fib. Continue Cardizem. Will not restart Toprol at this time due to soft BP and sinus pause noted 2 days ago. Continue Xarelto. Will arrange cardiac event monitor.   4. HLD: Continue statin. .   5. DM: Continue SSI      Signed, Verne Carrow, MD  07/16/2018, 9:05 AM

## 2018-07-16 NOTE — Progress Notes (Signed)
[facility] PROGRESS NOTE  Elaine HibbsCarol D Mathews WUJ:[id number] DOB: 04/08/1933 DOA: 07/12/2018 PCP: Lucianne LeiUppin, Nina, MD  Assessment/Plan: 1. NSTEMI.   Troponin peak of 7.48, nonischemic s/p DES to proximal/mid LAD (6/1), continue home xarelto, added clopidogrel. Holding metoprolol and Resuming diltiazem due to #2  2. Symptomatic sinus pause. Witnessed LOC by nursing and myself on 6/2, no recurrent pauses overnight in ICU. ? Related to vasovagal given abdominal pain and syncope prior to admission. Monitored in ICU overnight given recurrent symptomatic pauses and witnessed seizure. Continue holding toprol and dilitazem. EP input needed for possible pacemaker, cardiology following   3. Syncope. Unwitnessed falls prior to admission that occur while on the way to bathroom with dizziness and controlled fall/slode to ground per patient( x 2 no LOC or head trauma or abdominal trauma)Initially thought related to NSTEMI; however sinus pause could be contributing discontinue nodal blockers(dilt, toprol), monitor on telemetry. Cardiology will discuss with EP, PT eval and treat  4. New onset seizure. Witnessed by myself and nursing on 6/2 with tonic-clonic activity and left head term and bilateral arm extension,  post ictal period with stool and urine incontinence. Loaded with keppra, no repeat seizure activity. CT head nonacute, seizure could lead to bradycardia but unclear correlation ( sinus pause--hypotension--> seizure?). Neurology following, on keppra, EEG unremarkable for any active seizures.  5. Splenic injury. Abd xr and CT abd obtained for work up of Left abdominal pain. Found to have spleen injury with small subcapsular hematoma with no sign of extravasation. Denies abdominal injury in falls prior to admission, hgb stable, close monitoring, pain control  6. Confusion and difficulty following commands. Occurred on 6/2 shortly after returning from cath, resolved now. Completely following all commands, no focal  deficits, completely oriented does have history of stroke,  no residual weakness, CT head neg for acute. Likely related to postictal stat?  7. Paroxysmal AF. Back in RVR and resumed cardizem. Holding BB given sinus pause. Already on xarelto at home Monitor on telemetry  8. Prior stroke hx. Old Right parietal infarct on CT head. No residual deficits on exam   9. Abnormal UA. No dysuria. UA(t [place]) trace Leuks, pos nitrites, 11-20 WBC. [facility] cx pending, no symptoms here  10. Chronic diarrhea. States hx of IBS, on welchol for. No diarhea here.   11. Hypothyroidism. Continue Synthroid  12. Hypertension. Stable. Continue lisinopril. Hold BB and CCB given symptomatic sinus Pause. Presented with syncope and occurred after LHC witnessed by nursing. Concer. Initially presumed related to above but now  13. Hyperlipidemia. LDL 121. Started atorvastatin    Code Status: FULL   Family Communication: d/w grand daughter 07/15/2018  Disposition Plan:  PT recommended SNF Repeat COVID testing has been sent out  Consultants:  Cardiology, Neurology, PCCM  Procedures:  Left heart cath, 6/1 FINDINGS: 1. Two vessel coronary artery disease with multifocal LAD (40% proximal, 70% mid, and 80 apical).  Proximal and mid LAD disease is significant by DFR (0.74).  Culpril lesion for NSTEMI is likely occluded small OM branch, which is too small for PCI. 2. Normal left ventricular systolic function with mildly elevated LVEDP (15-20 mmHg). 3. Successful DFR-guided PCI to the proximal/mid LAD using a Resolute Onyx 2.75 x 18 mm drug-eluting stent with 0% residual stenosis and TIMI-3 flow.  Antibiotics:  none (indicate start date, and stop date if known)  HPI/Subjective: Reports some neck pain.  No nausea no vomiting no fever no chills.  Objective: Vitals:   07/16/18 1131 07/16/18  1540  BP: 128/63 (!) 114/54  Pulse: 99 91  Resp:  16  Temp:  99.1 F (37.3 C)  SpO2: 99% 96%     Intake/Output Summary (Last 24 hours) at 07/16/2018 2005 Last data filed at 07/16/2018 1800 Gross per 24 hour  Intake 1083 ml  Output 1200 ml  Net -117 ml   Filed Weights   07/14/18 1700 07/15/18 0509 07/16/18 0528  Weight: 57.4 kg 57.2 kg 56.2 kg    Exam:   General: Elderly female, no distress  Cardiovascular: Regular rate and rhythm, no appreciable murmurs rubs or gallops, no edema  Respiratory: Normal respiratory effort on room air, normal breath sounds  Abdomen: Soft, nontender, normal bowel sounds  Musculoskeletal: No range of motion  Skin no rashes or lesions  Neurologic alert is to person, place, time, context.  Moving all extremities on command. Strength 5/5. No focal deficits noted  Data Reviewed: Basic Metabolic Panel: Recent Labs  Lab 07/13/18 0811 07/13/18 2048 07/14/18 0746 07/16/18 0638  NA 132* 133* 135 133*  K 3.5 3.6 3.2* 4.2  CL 99 99 101 99  CO2 21* 24 23 23   GLUCOSE 138* 150* 113* 128*  BUN 7* 7* 7* 10  CREATININE 0.65 0.56 0.72 0.74  CALCIUM 8.7* 8.9 8.7* 8.8*  MG  --  1.6* 2.5*  --   PHOS  --  3.7 3.5  --    Liver Function Tests: Recent Labs  Lab 07/13/18 0811 07/13/18 2048  AST 92* 78*  ALT 38 38  ALKPHOS 58 58  BILITOT 0.9 1.0  PROT 6.4* 6.9  ALBUMIN 3.4* 3.6   Recent Labs  Lab 07/13/18 2048  LIPASE 21  AMYLASE 12*   Recent Labs  Lab 07/13/18 2048  AMMONIA 14   CBC: Recent Labs  Lab 07/13/18 0811 07/13/18 2048 07/14/18 0746 07/15/18 0503 07/16/18 0444  WBC 5.7 7.7 6.4 5.3 6.0  NEUTROABS  --  6.1 4.4  --   --   HGB 10.7* 11.3* 10.6* 10.1* 10.2*  HCT 32.0* 33.9* 31.8* 30.2* 30.7*  MCV 89.9 90.2 89.8 89.9 89.5  PLT 122* 121* 121* 117* 110*   Cardiac Enzymes: Recent Labs  Lab 07/12/18 1959 07/13/18 0234 07/13/18 0811  TROPONINI 4.12* 7.48* 7.17*   BNP (last 3 results) No results for input(s): BNP in the last 8760 hours.  ProBNP (last 3 results) No results for input(s): PROBNP in the last 8760  hours.  CBG: Recent Labs  Lab 07/15/18 1555 07/15/18 2053 07/16/18 0618 07/16/18 1158 07/16/18 1639  GLUCAP 156* 138* 121* 125* 158*    Recent Results (from the past 240 hour(s))  Culture, Urine     Status: None   Collection Time: 07/13/18  6:48 AM  Result Value Ref Range Status   Specimen Description URINE, RANDOM  Final   Special Requests NONE  Final   Culture   Final    NO GROWTH Performed at [facility], [address] [place], [place] [address]    Report Status 07/14/2018 FINAL  Final  MRSA PCR Screening     Status: None   Collection Time: 07/14/18  9:18 AM  Result Value Ref Range Status   MRSA by PCR NEGATIVE NEGATIVE Final    Comment:        The GeneXpert MRSA Assay (FDA approved for NASAL specimens only), is one component of a comprehensive MRSA colonization surveillance program. It is not intended to diagnose MRSA infection nor to guide or monitor treatment  for MRSA infections. Performed at [facility], [address] [place], [place] [address]      Studies: No results found.  Scheduled Meds: . atorvastatin  40 mg Oral q1800  . clopidogrel  75 mg Oral Q breakfast  . diltiazem  180 mg Oral Daily  . insulin aspart  0-9 Units Subcutaneous TID WC & HS  . levETIRAcetam  500 mg Oral BID  . levothyroxine  75 mcg Oral Q0600  . lisinopril  40 mg Oral QHS  . rivaroxaban  20 mg Oral Q supper  . sodium chloride flush  3 mL Intravenous Q12H  . sodium chloride flush  3 mL Intravenous Q12H   Continuous Infusions: . sodium chloride Stopped (07/13/18 2039)  . nitroGLYCERIN 5 mcg/min (07/13/18 1630)      Author:  Lynden Oxford, MD [facility] 07/16/2018   To reach On-call, see care teams to locate the attending and reach out to them via [email or web address]. If 7PM-7AM, please contact night-coverage If you still have difficulty reaching the attending provider, please page the [facility] (Director on Call) for [facility] on amion for  assistance.

## 2018-07-16 NOTE — Progress Notes (Signed)
CARDIAC REHAB PHASE I   PRE:  Rate/Rhythm: 89 afib  BP:  Supine: 128/63  Sitting:   Standing:    SaO2: 98%RA  MODE:  Ambulation: 470 ft   POST:  Rate/Rhythm: 124 afib  BP:  Supine:    Sitting: 147/69  Standing:    SaO2: 97%RA 1126-1207 Pt walked 470 ft on RA with gait belt use, rolling walker and asst x 2. Impulsive and encouraged to slow down. Unsafe to be up by herself. To recliner with call bell. Gave MI booklet and discussed NTG use, MI restrictions, importance of plavix with stent and CRP 2. Made referral to [facility] [place] [facility] but pt does not want to attend.    Luetta Nutting, RN BSN  07/16/2018 12:01 PM

## 2018-07-17 LAB — CBC
HCT: 29.7 % — ABNORMAL LOW (ref 36.0–46.0)
Hemoglobin: 9.9 g/dL — ABNORMAL LOW (ref 12.0–15.0)
MCH: 30 pg (ref 26.0–34.0)
MCHC: 33.3 g/dL (ref 30.0–36.0)
MCV: 90 fL (ref 80.0–100.0)
Platelets: 151 10*3/uL (ref 150–400)
RBC: 3.3 MIL/uL — ABNORMAL LOW (ref 3.87–5.11)
RDW: 13.7 % (ref 11.5–15.5)
WBC: 5.7 10*3/uL (ref 4.0–10.5)
nRBC: 0 % (ref 0.0–0.2)

## 2018-07-17 LAB — NOVEL CORONAVIRUS, NAA (HOSP ORDER, SEND-OUT TO REF LAB; TAT 18-24 HRS): SARS-CoV-2, NAA: NOT DETECTED

## 2018-07-17 LAB — GLUCOSE, CAPILLARY
Glucose-Capillary: 139 mg/dL — ABNORMAL HIGH (ref 70–99)
Glucose-Capillary: 167 mg/dL — ABNORMAL HIGH (ref 70–99)
Glucose-Capillary: 156 mg/dL — ABNORMAL HIGH (ref 70–99)
Glucose-Capillary: 110 mg/dL — ABNORMAL HIGH (ref 70–99)

## 2018-07-17 MED ORDER — RIVAROXABAN 15 MG PO TABS
15.0000 mg | ORAL_TABLET | Freq: Every day | ORAL | Status: DC
  Administered 2018-07-17: 15 mg via ORAL
  Filled 2018-07-17: qty 1

## 2018-07-17 NOTE — Progress Notes (Signed)
[facility] PROGRESS NOTE  Elaine Mathews [id number]:[id number] DOB: Jan 24, 1934 DOA: 07/12/2018 PCP: Lucianne Lei, MD  Assessment/Plan: 1. NSTEMI.   Troponin peak of 7.48, nonischemic s/p DES to proximal/mid LAD (6/1), continue home xarelto, added clopidogrel. Holding metoprolol and Resuming diltiazem due to #2  2. Symptomatic sinus pause. Witnessed LOC by nursing and myself on 6/2, no recurrent pauses overnight in ICU. ? Related to vasovagal given abdominal pain and syncope prior to admission. Monitored in ICU overnight given recurrent symptomatic pauses and witnessed seizure. Continue holding toprol and dilitazem. EP input needed for possible pacemaker, cardiology following   3. Syncope. Unwitnessed falls prior to admission that occur while on the way to bathroom with dizziness and controlled fall/slode to ground per patient( x 2 no LOC or head trauma or abdominal trauma)Initially thought related to NSTEMI; however sinus pause could be contributing discontinue nodal blockers(dilt, toprol), monitor on telemetry. Cardiology will discuss with EP, PT eval and treat  4. New onset seizure. Witnessed by myself and nursing on 6/2 with tonic-clonic activity and left head term and bilateral arm extension,  post ictal period with stool and urine incontinence. Loaded with keppra, no repeat seizure activity. CT head nonacute, seizure could lead to bradycardia but unclear correlation ( sinus pause--hypotension--> seizure?). Neurology following, on keppra, EEG unremarkable for any active seizures.  5. Splenic injury. Abd xr and CT abd obtained for work up of Left abdominal pain. Found to have spleen injury with small subcapsular hematoma with no sign of extravasation. Denies abdominal injury in falls prior to admission, hgb stable, close monitoring, pain control  6. Confusion and difficulty following commands. Occurred on 6/2 shortly after returning from cath, resolved now. Completely following all commands, no focal  deficits, completely oriented does have history of stroke,  no residual weakness, CT head neg for acute. Likely related to postictal stat?  7. Paroxysmal AF. Back in RVR and resumed cardizem. Holding BB given sinus pause. Already on xarelto at home Monitor on telemetry  8. Prior stroke hx. Old Right parietal infarct on CT head. No residual deficits on exam   9. Abnormal UA. No dysuria. UA(t [place]) trace Leuks, pos nitrites, 11-20 WBC. S/p levaquin at [facility]. cx pending, no symptoms here  10. Chronic diarrhea. States hx of IBS, on welchol for. No diarhea here.   11. Hypothyroidism. Continue Synthroid  12. Hypertension. Stable. Continue lisinopril. Hold BB and CCB given symptomatic sinus Pause. Presented with syncope and occurred after LHC witnessed by nursing. Concer. Initially presumed related to above but now  13. Hyperlipidemia. LDL 121. Started atorvastatin    Code Status: FULL   Family Communication: d/w grand daughter 07/15/2018  Disposition Plan:  PT recommended SNF Repeat COVID testing Negative  Consultants:  Cardiology, Neurology, PCCM  Procedures:  Left heart cath, 6/1 FINDINGS: 1. Two vessel coronary artery disease with multifocal LAD (40% proximal, 70% mid, and 80 apical).  Proximal and mid LAD disease is significant by DFR (0.74).  Culpril lesion for NSTEMI is likely occluded small OM branch, which is too small for PCI. 2. Normal left ventricular systolic function with mildly elevated LVEDP (15-20 mmHg). 3. Successful DFR-guided PCI to the proximal/mid LAD using a Resolute Onyx 2.75 x 18 mm drug-eluting stent with 0% residual stenosis and TIMI-3 flow.  Antibiotics:  none (indicate start date, and stop date if known)  HPI/Subjective: No headache no neck pain.  No fever no chills.  Objective: Vitals:   07/17/18 1320 07/17/18 2029  BP: 111/86 113/79  Pulse: 89 93  Resp: 16 20  Temp: 98 F (36.7 C) (!) 97.5 F (36.4 C)  SpO2: 95% 93%    Intake/Output  Summary (Last 24 hours) at 07/17/2018 2108 Last data filed at 07/17/2018 1400 Gross per 24 hour  Intake 780 ml  Output 1500 ml  Net -720 ml   Filed Weights   07/15/18 0509 07/16/18 0528 07/17/18 0640  Weight: 57.2 kg 56.2 kg 56.9 kg    Exam:   General: Elderly female, no distress  Cardiovascular: Regular rate and rhythm, no appreciable murmurs rubs or gallops, no edema  Respiratory: Normal respiratory effort on room air, normal breath sounds  Abdomen: Soft, nontender, normal bowel sounds  Musculoskeletal: No range of motion  Skin no rashes or lesions  Neurologic alert is to person, place, time, context.  Moving all extremities on command. Strength 5/5. No focal deficits noted  Data Reviewed: Basic Metabolic Panel: Recent Labs  Lab 07/13/18 0811 07/13/18 2048 07/14/18 0746 07/16/18 0638  NA 132* 133* 135 133*  K 3.5 3.6 3.2* 4.2  CL 99 99 101 99  CO2 21* 24 23 23   GLUCOSE 138* 150* 113* 128*  BUN 7* 7* 7* 10  CREATININE 0.65 0.56 0.72 0.74  CALCIUM 8.7* 8.9 8.7* 8.8*  MG  --  1.6* 2.5*  --   PHOS  --  3.7 3.5  --    Liver Function Tests: Recent Labs  Lab 07/13/18 0811 07/13/18 2048  AST 92* 78*  ALT 38 38  ALKPHOS 58 58  BILITOT 0.9 1.0  PROT 6.4* 6.9  ALBUMIN 3.4* 3.6   Recent Labs  Lab 07/13/18 2048  LIPASE 21  AMYLASE 12*   Recent Labs  Lab 07/13/18 2048  AMMONIA 14   CBC: Recent Labs  Lab 07/13/18 2048 07/14/18 0746 07/15/18 0503 07/16/18 0444 07/17/18 0413  WBC 7.7 6.4 5.3 6.0 5.7  NEUTROABS 6.1 4.4  --   --   --   HGB 11.3* 10.6* 10.1* 10.2* 9.9*  HCT 33.9* 31.8* 30.2* 30.7* 29.7*  MCV 90.2 89.8 89.9 89.5 90.0  PLT 121* 121* 117* 110* 151   Cardiac Enzymes: Recent Labs  Lab 07/12/18 1959 07/13/18 0234 07/13/18 0811  TROPONINI 4.12* 7.48* 7.17*   BNP (last 3 results) No results for input(s): BNP in the last 8760 hours.  ProBNP (last 3 results) No results for input(s): PROBNP in the last 8760 hours.  CBG: Recent Labs   Lab 07/16/18 2233 07/17/18 0634 07/17/18 1112 07/17/18 1646 07/17/18 2103  GLUCAP 124* 139* 167* 156* 110*    Recent Results (from the past 240 hour(s))  Culture, Urine     Status: None   Collection Time: 07/13/18  6:48 AM  Result Value Ref Range Status   Specimen Description URINE, RANDOM  Final   Special Requests NONE  Final   Culture   Final    NO GROWTH Performed at [facility], [address] [place], [place] [address]    Report Status 07/14/2018 FINAL  Final  MRSA PCR Screening     Status: None   Collection Time: 07/14/18  9:18 AM  Result Value Ref Range Status   MRSA by PCR NEGATIVE NEGATIVE Final    Comment:        The GeneXpert MRSA Assay (FDA approved for NASAL specimens only), is one component of a comprehensive MRSA colonization surveillance program. It is not intended to diagnose MRSA infection nor to guide or monitor treatment for MRSA infections.  Performed at [facility], [address] [place], [place] [address]   Novel Coronavirus, NAA (hospital order; send-out to ref lab)     Status: None   Collection Time: 07/16/18 11:54 AM  Result Value Ref Range Status   SARS-CoV-2, NAA NOT DETECTED NOT DETECTED Final    Comment: (NOTE) This test was developed and its performance characteristics determined by [employer]. This test has not been FDA cleared or approved. This test has been authorized by FDA under an Emergency Use Authorization (EUA). This test is only authorized for the duration of time the declaration that circumstances exist justifying the authorization of the emergency use of in vitro diagnostic tests for detection of SARS-CoV-2 virus and/or diagnosis of COVID-19 infection under section 564(b)(1) of the Act, 21 U.S.C. 088PJS-3(P)(5), unless the authorization is terminated or revoked sooner. When diagnostic testing is negative, the possibility of a false negative result should be considered in the context of  a patient's recent exposures and the presence of clinical signs and symptoms consistent with COVID-19. An individual without symptoms of COVID-19 and who is not shedding SARS-CoV-2 virus would expect to have a negative (not detected) result in this assay. Performed  At: [facility] [address] [place], [place] [id number] Jolene Schimke MD KM:[id number]    Coronavirus Source NASOPHARYNGEAL  Final    Comment: Performed at [facility], [address], [place], [place] [address]     Studies: No results found.  Scheduled Meds: . atorvastatin  40 mg Oral q1800  . clopidogrel  75 mg Oral Q breakfast  . diltiazem  180 mg Oral Daily  . insulin aspart  0-9 Units Subcutaneous TID WC & HS  . levETIRAcetam  500 mg Oral BID  . levothyroxine  75 mcg Oral Q0600  . lisinopril  40 mg Oral QHS  . rivaroxaban  15 mg Oral Q supper  . sodium chloride flush  3 mL Intravenous Q12H  . sodium chloride flush  3 mL Intravenous Q12H   Continuous Infusions: . sodium chloride Stopped (07/13/18 2039)      Author:  Lynden Oxford, MD [facility] 07/17/2018   To reach On-call, see care teams to locate the attending and reach out to them via [email or web address]. If 7PM-7AM, please contact night-coverage If you still have difficulty reaching the attending provider, please page the [facility] (Director on Call) for [facility] on amion for assistance.

## 2018-07-17 NOTE — Progress Notes (Signed)
Physical Therapy Treatment Patient Details Name: Elaine Mathews MRN: [id number] DOB: 1933/11/01 Today's Date: 07/17/2018    History of Present Illness Pt is an 85 year old woman admitted from [facility] with NSTEMI on 07/12/18. Hospital course complicated by seizure on 07/14/18 and incidental finding of splenic injury. PMH: CVA, HTN, afib, IBS, hypothyroidism.    PT Comments    Pt less labile today and agreeable to working with therapy after her lunch. While pt is making progress towards her goals, she is limited in safe mobility by decreased cognition especially safety awareness and decreased knowledge of DME. Pt is min guard for bed mobility and min A for transfers, generally minA for ambulation but experiences LoB requiring modA to steady due to decreased safety with RW. Pt's difficulty with problem solving in moving RW around obstacles, and finding room despite maximal vc for room number and which side of hallway it should be on. PT recommends SNF level rehab for improved safety and balance.      Follow Up Recommendations  SNF     Equipment Recommendations  None recommended by PT       Precautions / Restrictions Precautions Precautions: Fall Restrictions Weight Bearing Restrictions: No    Mobility  Bed Mobility Overal bed mobility: Needs Assistance Bed Mobility: Supine to Sit     Supine to sit: Min guard     General bed mobility comments: min guard for safety, increased time and effort required  Transfers Overall transfer level: Needs assistance Equipment used: 1 person hand held assist;Rolling walker (2 wheeled) Transfers: Stand Pivot Transfers;Sit to/from Stand Sit to Stand: Min assist         General transfer comment: min A for steadying with sit>stand from bed and toilet  Ambulation/Gait Ambulation/Gait assistance: Min assist;Mod assist Gait Distance (Feet): 450 Feet Assistive device: Rolling walker (2 wheeled) Gait Pattern/deviations: Step-through  pattern;Decreased step length - right;Decreased step length - left;Trunk flexed Gait velocity: slowed Gait velocity interpretation: 1.31 - 2.62 ft/sec, indicative of limited community ambulator General Gait Details: minA for majority of gait, however poor ability to safely navigate around obstacles, picking RW up to walk with, pushed RW to sink to wash hands and when cued to put hands back on RW, was backward in walker and could not figure out how to run around and face forward in walker, with turning in hallway experienced LoB requiring modA to steady as she turned walker 180 degrees and tripped over walker leg         Balance Overall balance assessment: Needs assistance Sitting-balance support: No upper extremity supported;Feet supported Sitting balance-Leahy Scale: Fair     Standing balance support: During functional activity;Single extremity supported;No upper extremity supported Standing balance-Leahy Scale: Fair                              Cognition Arousal/Alertness: Awake/alert Behavior During Therapy: Impulsive;Restless;Anxious Overall Cognitive Status: Impaired/Different from baseline Area of Impairment: Following commands;Safety/judgement;Awareness;Problem solving;Attention                   Current Attention Level: Selective   Following Commands: Follows multi-step commands inconsistently;Follows one step commands inconsistently;Follows multi-step commands with increased time;Follows one step commands with increased time Safety/Judgement: Decreased awareness of safety;Decreased awareness of deficits Awareness: Emergent Problem Solving: Slow processing;Decreased initiation;Difficulty sequencing;Requires verbal cues;Requires tactile cues General Comments: uses lots of deflection today to hide deficits, could not find room with maximal vc for room  number and side of hallway          General Comments General comments (skin integrity, edema, etc.):  VSS      Pertinent Vitals/Pain Faces Pain Scale: No hurt           PT Goals (current goals can now be found in the care plan section) Acute Rehab PT Goals Patient Stated Goal: to go to heaven PT Goal Formulation: With patient Time For Goal Achievement: 07/29/18 Potential to Achieve Goals: Fair Progress towards PT goals: Progressing toward goals    Frequency    Min 2X/week      PT Plan Current plan remains appropriate       AM-PAC PT "6 Clicks" Mobility   Outcome Measure  Help needed turning from your back to your side while in a flat bed without using bedrails?: None Help needed moving from lying on your back to sitting on the side of a flat bed without using bedrails?: None Help needed moving to and from a bed to a chair (including a wheelchair)?: A Little Help needed standing up from a chair using your arms (e.g., wheelchair or bedside chair)?: A Little Help needed to walk in hospital room?: A Little Help needed climbing 3-5 steps with a railing? : A Lot 6 Click Score: 19    End of Session Equipment Utilized During Treatment: Gait belt Activity Tolerance: Patient tolerated treatment well Patient left: in chair;with call bell/phone within reach;with chair alarm set Nurse Communication: Mobility status PT Visit Diagnosis: Other abnormalities of gait and mobility (R26.89);Difficulty in walking, not elsewhere classified (R26.2)     Time: 6712-4580 PT Time Calculation (min) (ACUTE ONLY): 26 min  Charges:  $Gait Training: 23-37 mins                      B. Beverely Risen PT, DPT Acute Rehabilitation Services Pager [phone] Office [phone]    Elon Alas Fleet 07/17/2018, 4:12 PM

## 2018-07-17 NOTE — Progress Notes (Signed)
Pharmacy note: Xarelto  67 you female on Xarelto 20mg  po daily PTA for afib. She is s/p PCI on 6/1 on plavix. She is also noted with splenic injury with a small hematoma. -Wt= 56.9 kg, SCr= 0.74, CrCl ~ 45  -Spoke with Dr. Sanjuana Kava: due to the above with the addition or borderline CrCl with reduce the Xarelto dose  Plan -Change Xarelto to 15mg  po daily  Harland German, PharmD Clinical Pharmacist **Pharmacist phone directory can now be found on [email or web address] ([email or web address]).  Listed under [facility].

## 2018-07-17 NOTE — Progress Notes (Signed)
Progress Note  Patient Name: Elaine Mathews Date of Encounter: 07/17/2018  Primary Cardiologist: Revankar  Subjective   No chest pain or dyspnea  Inpatient Medications    Scheduled Meds: . atorvastatin  40 mg Oral q1800  . clopidogrel  75 mg Oral Q breakfast  . diltiazem  180 mg Oral Daily  . insulin aspart  0-9 Units Subcutaneous TID WC & HS  . levETIRAcetam  500 mg Oral BID  . levothyroxine  75 mcg Oral Q0600  . lisinopril  40 mg Oral QHS  . rivaroxaban  20 mg Oral Q supper  . sodium chloride flush  3 mL Intravenous Q12H  . sodium chloride flush  3 mL Intravenous Q12H   Continuous Infusions: . sodium chloride Stopped (07/13/18 2039)   PRN Meds: sodium chloride, acetaminophen, ondansetron (ZOFRAN) IV, sodium chloride flush   Vital Signs    Vitals:   07/16/18 1131 07/16/18 1540 07/16/18 2126 07/17/18 0640  BP: 128/63 (!) 114/54 (!) 132/59   Pulse: 99 91 83   Resp:  16    Temp:  99.1 F (37.3 C) 99 F (37.2 C)   TempSrc:  Oral Oral   SpO2: 99% 96% 95%   Weight:    56.9 kg  Height:        Intake/Output Summary (Last 24 hours) at 07/17/2018 1032 Last data filed at 07/17/2018 0847 Gross per 24 hour  Intake 783 ml  Output 1800 ml  Net -1017 ml   Last 3 Weights 07/17/2018 07/16/2018 07/15/2018  Weight (lbs) 125 lb 6.4 oz 123 lb 14.4 oz 126 lb 1.7 oz  Weight (kg) 56.881 kg 56.2 kg 57.2 kg      Telemetry    Atrial fib, rate 100-110 bpm - Personally Reviewed  ECG    No AM EKG  Physical Exam   General: Well developed, well nourished, NAD  HEENT: OP clear, mucus membranes moist  SKIN: warm, dry. No rashes. Neuro: No focal deficits  Musculoskeletal: Muscle strength 5/5 all ext  Psychiatric: Mood and affect normal  Neck: No JVD, no carotid bruits, no thyromegaly, no lymphadenopathy.  Lungs:Clear bilaterally, no wheezes, rhonci, crackles Cardiovascular: Regular rate and rhythm. No murmurs, gallops or rubs. Abdomen:Soft. Bowel sounds present. Non-tender.   Extremities: No lower extremity edema. Pulses are 2 + in the bilateral DP/PT.  Labs    Chemistry Recent Labs  Lab 07/13/18 [id number] 07/13/18 2048 07/14/18 0746 07/16/18 0638  NA 132* 133* 135 133*  K 3.5 3.6 3.2* 4.2  CL 99 99 101 99  CO2 21* 24 23 23   GLUCOSE 138* 150* 113* 128*  BUN 7* 7* 7* 10  CREATININE 0.65 0.56 0.72 0.74  CALCIUM 8.7* 8.9 8.7* 8.8*  PROT 6.4* 6.9  --   --   ALBUMIN 3.4* 3.6  --   --   AST 92* 78*  --   --   ALT 38 38  --   --   ALKPHOS 58 58  --   --   BILITOT 0.9 1.0  --   --   GFRNONAA >60 >60 >60 >60  GFRAA >60 >60 >60 >60  ANIONGAP 12 10 11 11      Hematology Recent Labs  Lab 07/15/18 0503 07/16/18 0444 07/17/18 0413  WBC 5.3 6.0 5.7  RBC 3.36* 3.43* 3.30*  HGB 10.1* 10.2* 9.9*  HCT 30.2* 30.7* 29.7*  MCV 89.9 89.5 90.0  MCH 30.1 29.7 30.0  MCHC 33.4 33.2 33.3  RDW 13.4 13.6 13.7  PLT 117* 110* 151    Cardiac Enzymes Recent Labs  Lab 07/12/18 1959 07/13/18 0234 07/13/18 0811  TROPONINI 4.12* 7.48* 7.17*   No results for input(s): TROPIPOC in the last 168 hours.   BNPNo results for input(s): BNP, PROBNP in the last 168 hours.   DDimer No results for input(s): DDIMER in the last 168 hours.   Radiology    No results found.  Cardiac Studies     Patient Profile     85 y.o. female with a past medical history significant for paroxysmal atrial fibrillation (supposed to be on Xarelto), prior CVA, uncontrolled hypertension, diabetes mellitus, hyperlipidemia, hypothyroidism, and vitamin D deficiency who presented to the hospital after a syncopal episode. Cardiac cath 07/13/18 with severe stenosis LAD treated with a DES. Metoprolol and Cardizem held due to sinus pauses initially but given atrial fib with RVR on 07/15/18 the Cardizem was restarted.   Assessment & Plan    1. CAD/NSTEMI in the setting of near syncope: She is s/p placement of a drug eluting stent in the LAD 07/13/18. Her metoprolol and Cardizem were held due to a 25 second  sinus arrest likely due to a vasovagal episode post sheath pull. She had onset of atrial fib with RVR 07/15/18. Cardizem was restarted. She has no chest pain today. Continue Plavix, statin, Cardizem and Xarelto.    2. HTN: BP is stable  3. PAF/sinus pauses: Heart rate 90-100 bpm in atrial fib. Continue Cardizem. Will not restart Toprol at this time due to soft BP and sinus pause noted 2 days ago. Continue Xarelto. No high grade heart block noted over last 48 hours. Was likely vagal related.   4. HLD: Continue statin. .   5. DM: Continue SSI  OK to d/c home from a cardiac perspective. If any further dizziness, will need an outpatient cardiac monitor.       Signed, Verne Carrow, MD  07/17/2018, 10:32 AM

## 2018-07-17 NOTE — Care Management Important Message (Signed)
Important Message  Patient Details  Name: Elaine Mathews MRN: [id number] Date of Birth: September 18, 1933   Medicare Important Message Given:  Yes    Renie Ora 07/17/2018, 12:31 PM

## 2018-07-17 NOTE — TOC Progression Note (Addendum)
Transition of Care [facility]) - Progression Note    Patient Details  Name: Elaine Mathews MRN: [id number] Date of Birth: [birth year]  Transition of Care [facility]) CM/SW Contact  Margarito Liner, LCSW Phone Number: 07/17/2018, 12:05 PM  Clinical Narrative: Left granddaughter a voicemail. Patient walked 470 feet with cardiac rehab yesterday. Waiting on updated PT note because pay may not qualify for SNF placement anymore.  1:03 pm: Received call back from granddaughter. Only SNF in [facility] that offered a bed is [facility] and [address]. Granddaughter said she reviewed their scores yesterday and was pleased with them. She discussed it with patient and she was agreeable as well. Admissions coordinator will start insurance authorization. If denied, granddaughter is agreeable to her returning home with home health.  Expected Discharge Plan: Skilled Nursing Facility Barriers to Discharge: English as a second language teacher, Continued Medical Work up  Expected Discharge Plan and Services Expected Discharge Plan: Skilled Nursing Facility     Post Acute Care Choice: Skilled Nursing Facility Living arrangements for the past 2 months: Apartment                                       Social Determinants of Health (SDOH) Interventions    Readmission Risk Interventions No flowsheet data found.

## 2018-07-18 DIAGNOSIS — K219 Gastro-esophageal reflux disease without esophagitis: Secondary | ICD-10-CM | POA: Diagnosis not present

## 2018-07-18 DIAGNOSIS — I4891 Unspecified atrial fibrillation: Secondary | ICD-10-CM | POA: Diagnosis not present

## 2018-07-18 DIAGNOSIS — I249 Acute ischemic heart disease, unspecified: Secondary | ICD-10-CM | POA: Diagnosis not present

## 2018-07-18 DIAGNOSIS — R569 Unspecified convulsions: Secondary | ICD-10-CM | POA: Diagnosis not present

## 2018-07-18 DIAGNOSIS — E782 Mixed hyperlipidemia: Secondary | ICD-10-CM | POA: Diagnosis not present

## 2018-07-18 DIAGNOSIS — E1144 Type 2 diabetes mellitus with diabetic amyotrophy: Secondary | ICD-10-CM | POA: Diagnosis not present

## 2018-07-18 DIAGNOSIS — R0989 Other specified symptoms and signs involving the circulatory and respiratory systems: Secondary | ICD-10-CM | POA: Diagnosis not present

## 2018-07-18 DIAGNOSIS — Z7401 Bed confinement status: Secondary | ICD-10-CM | POA: Diagnosis not present

## 2018-07-18 DIAGNOSIS — I10 Essential (primary) hypertension: Secondary | ICD-10-CM | POA: Diagnosis not present

## 2018-07-18 DIAGNOSIS — I214 Non-ST elevation (NSTEMI) myocardial infarction: Secondary | ICD-10-CM | POA: Diagnosis not present

## 2018-07-18 DIAGNOSIS — I119 Hypertensive heart disease without heart failure: Secondary | ICD-10-CM | POA: Diagnosis not present

## 2018-07-18 DIAGNOSIS — Z955 Presence of coronary angioplasty implant and graft: Secondary | ICD-10-CM | POA: Diagnosis not present

## 2018-07-18 DIAGNOSIS — E7800 Pure hypercholesterolemia, unspecified: Secondary | ICD-10-CM | POA: Diagnosis not present

## 2018-07-18 DIAGNOSIS — I480 Paroxysmal atrial fibrillation: Secondary | ICD-10-CM | POA: Diagnosis not present

## 2018-07-18 DIAGNOSIS — E871 Hypo-osmolality and hyponatremia: Secondary | ICD-10-CM | POA: Diagnosis not present

## 2018-07-18 DIAGNOSIS — N390 Urinary tract infection, site not specified: Secondary | ICD-10-CM | POA: Diagnosis not present

## 2018-07-18 DIAGNOSIS — J156 Pneumonia due to other aerobic Gram-negative bacteria: Secondary | ICD-10-CM | POA: Diagnosis not present

## 2018-07-18 DIAGNOSIS — R55 Syncope and collapse: Secondary | ICD-10-CM | POA: Diagnosis not present

## 2018-07-18 DIAGNOSIS — M2550 Pain in unspecified joint: Secondary | ICD-10-CM | POA: Diagnosis not present

## 2018-07-18 DIAGNOSIS — E1140 Type 2 diabetes mellitus with diabetic neuropathy, unspecified: Secondary | ICD-10-CM | POA: Diagnosis not present

## 2018-07-18 DIAGNOSIS — K589 Irritable bowel syndrome without diarrhea: Secondary | ICD-10-CM | POA: Diagnosis not present

## 2018-07-18 DIAGNOSIS — G932 Benign intracranial hypertension: Secondary | ICD-10-CM | POA: Diagnosis not present

## 2018-07-18 DIAGNOSIS — E039 Hypothyroidism, unspecified: Secondary | ICD-10-CM | POA: Diagnosis not present

## 2018-07-18 LAB — GLUCOSE, CAPILLARY
Glucose-Capillary: 142 mg/dL — ABNORMAL HIGH (ref 70–99)
Glucose-Capillary: 146 mg/dL — ABNORMAL HIGH (ref 70–99)

## 2018-07-18 MED ORDER — ATORVASTATIN CALCIUM 40 MG PO TABS: 40.0000 mg | ORAL_TABLET | Freq: Every day | ORAL | 0 refills | Status: DC

## 2018-07-18 MED ORDER — RIVAROXABAN 15 MG PO TABS: 15.0000 mg | ORAL_TABLET | Freq: Every day | ORAL | 0 refills | Status: DC

## 2018-07-18 MED ORDER — CLOPIDOGREL BISULFATE 75 MG PO TABS: 75.0000 mg | ORAL_TABLET | Freq: Every day | ORAL | 0 refills | Status: DC

## 2018-07-18 MED ORDER — LEVETIRACETAM 500 MG PO TABS: 500.0000 mg | ORAL_TABLET | Freq: Two times a day (BID) | ORAL | 0 refills | Status: DC

## 2018-07-18 NOTE — Plan of Care (Signed)
  Problem: Health Behavior/Discharge Planning: Goal: Ability to manage health-related needs will improve Outcome: Adequate for Discharge   Problem: Clinical Measurements: Goal: Ability to maintain clinical measurements within normal limits will improve Outcome: Adequate for Discharge Goal: Will remain free from infection Outcome: Adequate for Discharge Goal: Diagnostic test results will improve Outcome: Adequate for Discharge Goal: Respiratory complications will improve Outcome: Adequate for Discharge Goal: Cardiovascular complication will be avoided Outcome: Adequate for Discharge   Problem: Activity: Goal: Risk for activity intolerance will decrease Outcome: Adequate for Discharge   Problem: Nutrition: Goal: Adequate nutrition will be maintained Outcome: Adequate for Discharge   Problem: Coping: Goal: Level of anxiety will decrease Outcome: Adequate for Discharge   Problem: Elimination: Goal: Will not experience complications related to bowel motility Outcome: Adequate for Discharge Goal: Will not experience complications related to urinary retention Outcome: Adequate for Discharge   Problem: Pain Managment: Goal: General experience of comfort will improve Outcome: Adequate for Discharge   Problem: Safety: Goal: Ability to remain free from injury will improve Outcome: Adequate for Discharge   Problem: Education: Goal: Understanding of cardiac disease, CV risk reduction, and recovery process will improve Outcome: Adequate for Discharge Goal: Individualized Educational Video(s) Outcome: Adequate for Discharge   Problem: Cardiac: Goal: Ability to achieve and maintain adequate cardiovascular perfusion will improve Outcome: Adequate for Discharge   Problem: Education: Goal: Understanding of CV disease, CV risk reduction, and recovery process will improve Outcome: Adequate for Discharge Goal: Individualized Educational Video(s) Outcome: Adequate for Discharge    Problem: Cardiovascular: Goal: Ability to achieve and maintain adequate cardiovascular perfusion will improve Outcome: Adequate for Discharge Goal: Vascular access site(s) Level 0-1 will be maintained Outcome: Adequate for Discharge   Problem: Education: Goal: Knowledge of disease or condition will improve Outcome: Adequate for Discharge Goal: Understanding of medication regimen will improve Outcome: Adequate for Discharge Goal: Individualized Educational Video(s) Outcome: Adequate for Discharge   Problem: Activity: Goal: Ability to tolerate increased activity will improve Outcome: Adequate for Discharge   Problem: Cardiac: Goal: Ability to achieve and maintain adequate cardiopulmonary perfusion will improve Outcome: Adequate for Discharge   Problem: Health Behavior/Discharge Planning: Goal: Ability to safely manage health-related needs after discharge will improve Outcome: Adequate for Discharge

## 2018-07-18 NOTE — Progress Notes (Signed)
Occupational Therapy Treatment Patient Details Name: Elaine Mathews MRN: [id number] DOB: 1933-11-01 Today's Date: 07/18/2018    History of present illness Pt is an 85 year old woman admitted from [facility] with NSTEMI on 07/12/18. Hospital course complicated by seizure on 07/14/18 and incidental finding of splenic injury. PMH: CVA, HTN, afib, IBS, hypothyroidism.   OT comments  Patient was eager to go to SNF today. Patient required min guard and cues for saftey for dressing UE/LE dressing. Patient requires min guard when completing standing hygiene tasks as decrease safety awareness.Patient tolerated session and was set up with lunch in chair with alarm set.     Follow Up Recommendations  SNF;Supervision/Assistance - 24 hour    Equipment Recommendations  3 in 1 bedside commode    Recommendations for Other Services      Precautions / Restrictions Precautions Precautions: Fall Restrictions Weight Bearing Restrictions: No RUE Weight Bearing: Non weight bearing       Mobility Bed Mobility               General bed mobility comments: presented in bed  Transfers Overall transfer level: Needs assistance Equipment used: Rolling walker (2 wheeled) Transfers: Sit to/from Stand Sit to Stand: Min guard Stand pivot transfers: Min guard            Balance Overall balance assessment: Needs assistance Sitting-balance support: No upper extremity supported;Feet supported Sitting balance-Leahy Scale: Good     Standing balance support: Bilateral upper extremity supported Standing balance-Leahy Scale: Fair                             ADL either performed or assessed with clinical judgement   ADL Overall ADL's : Needs assistance/impaired Eating/Feeding: Set up;Sitting   Grooming: Wash/dry hands;Min guard;Cueing for safety;Cueing for sequencing;Standing;Sitting   Upper Body Bathing: Min guard;Sitting;Standing   Lower Body Bathing: Min guard;Cueing for  safety;Cueing for sequencing   Upper Body Dressing : Supervision/safety;Sitting;Standing   Lower Body Dressing: Min guard;Cueing for safety;Cueing for sequencing   Toilet Transfer: Min guard;Grab bars   Toileting- Clothing Manipulation and Hygiene: Min guard;Cueing for safety;Cueing for sequencing       Functional mobility during ADLs: Min guard;Cueing for safety;Cueing for sequencing       Vision   Vision Assessment?: No apparent visual deficits   Perception     Praxis      Cognition Arousal/Alertness: Awake/alert Behavior During Therapy: Impulsive Overall Cognitive Status: Impaired/Different from baseline Area of Impairment: Safety/judgement;Awareness                   Current Attention Level: Selective   Following Commands: Follows multi-step commands inconsistently;Follows one step commands inconsistently;Follows multi-step commands with increased time;Follows one step commands with increased time Safety/Judgement: Decreased awareness of safety;Decreased awareness of deficits   Problem Solving: Requires verbal cues General Comments: cues for saftey         Exercises     Shoulder Instructions       General Comments      Pertinent Vitals/ Pain       Pain Assessment: Faces Faces Pain Scale: No hurt  Home Living                                          Prior Functioning/Environment  Frequency  Min 2X/week        Progress Toward Goals  OT Goals(current goals can now be found in the care plan section)  Progress towards OT goals: Progressing toward goals  Acute Rehab OT Goals Patient Stated Goal: to go to rehab OT Goal Formulation: With patient Time For Goal Achievement: 07/18/18 Potential to Achieve Goals: Good  Plan Discharge plan remains appropriate    Co-evaluation          OT goals addressed during session: ADL's and self-care      AM-PAC OT "6 Clicks" Daily Activity     Outcome  Measure   Help from another person eating meals?: A Little Help from another person taking care of personal grooming?: A Little Help from another person toileting, which includes using toliet, bedpan, or urinal?: A Little Help from another person bathing (including washing, rinsing, drying)?: A Little Help from another person to put on and taking off regular upper body clothing?: A Little Help from another person to put on and taking off regular lower body clothing?: A Little 6 Click Score: 18    End of Session Equipment Utilized During Treatment: Gait belt;Rolling walker  OT Visit Diagnosis: Unsteadiness on feet (R26.81);Other abnormalities of gait and mobility (R26.89);History of falling (Z91.81);Muscle weakness (generalized) (M62.81);Other symptoms and signs involving cognitive function;Hemiplegia and hemiparesis Hemiplegia - Right/Left: Left Hemiplegia - dominant/non-dominant: Non-Dominant Hemiplegia - caused by: Cerebral infarction   Activity Tolerance Patient tolerated treatment well   Patient Left in chair;with chair alarm set;with call bell/phone within reach   Nurse Communication          Time: 7902-4097 OT Time Calculation (min): 20 min  Charges: OT General Charges $OT Visit: 1 Visit OT Treatments $Self Care/Home Management : 8-22 mins  Joeseph Amor OTR/L  Acute Rehab Services  [phone] office number [phone] pager number    Joeseph Amor 07/18/2018, 1:10 PM

## 2018-07-18 NOTE — TOC Progression Note (Signed)
Transition of Care [facility]) - Progression Note    Patient Details  Name: KAELEE PFEFFER MRN: [id number] Date of Birth: [birth year]  Transition of Care [facility]) CM/SW [place], LCSW Phone Number: 07/18/2018, 10:15 AM  Clinical Narrative:  07/18/18 CSW spoke with Broadus John at [facility] and he stated that authorization was approved. CSW met with pt and pt's nurse at bedside to let her know that the authorization was received and pt stated to follow up with Granddaughter(Erin). CSW spoke with Granddaughter and let her know that authorization was received from [facility] and she is in agreement with pt going there. Pt's Granddaughter inquired if pt could have her own room. CSW informed her that she would relay that information to the SNF. CSW let pt. Granddaughter know that pt does not have yet have discharge ordrers and CSW will let her know when pt will be discharged.     Expected Discharge Plan: Skilled Nursing Facility Barriers to Discharge: Ship broker, Continued Medical Work up  Expected Discharge Plan and Services Expected Discharge Plan: [place] Choice: [place] arrangements for the past 2 months: Apartment                                       Social Determinants of Health (SDOH) Interventions    Readmission Risk Interventions No flowsheet data found.

## 2018-07-18 NOTE — Discharge Summary (Addendum)
[facility] Discharge Summary   Patient: Elaine Mathews YNW:[id number]   PCP: Lucianne Lei, MD DOB: 09/05/1933   Date of admission: 07/12/2018   Date of discharge:  07/18/2018    Discharge Diagnoses:  Principal Problem:   NSTEMI (non-ST elevated myocardial infarction) [facility]) Active Problems:   Paroxysmal atrial fibrillation (HCC)   Hypertension   Controlled type 2 diabetes with neuropathy (HCC)   Hypertensive urgency   Elevated LFTs   Sinus pause   Spleen injury   Spleen hematoma without rupture of capsule, without open wound into cavity   Syncope, vasovagal   Seizure (HCC)   Admitted From: HOME Disposition:  SNF   Recommendations for Outpatient Follow-up:  1. Please follow up with PCP in 1 week 2. May need event recorder outpaitent 3. Will need neurology follow up for seizure    Contact information for follow-up providers    Revankar, Aundra Dubin, MD Follow up on 07/28/2018.   Specialty:  Cardiology Why:  at 8:25am for your follow up appt. This will be a virtual visit.  Contact information: [address] [place] [address] [phone]        Lucianne Lei, MD. Schedule an appointment as soon as possible for a visit in 1 week(s).   Specialty:  Internal Medicine Contact information: [address] [place] [address] [phone]            Contact information for after-discharge care    Destination    [facility] .   Service:  Skilled Nursing Contact information: [address] [place] [address] [phone]                 Diet recommendation: cardiac diet  Activity: The patient is advised to gradually reintroduce usual activities.  Discharge Condition: good  Code Status: full code  History of present illness: As per the H and P dictated on admission, "Elaine Mathews is a 85 y.o. female with medical history significant of PAF on xarelto, prior stroke, HTN, reported DM2 though not  on any meds.  Patient for unclear reasons decided to take herself off of all meds other than synthroid, now tells me "it was a stupid thing".  She was in normal state of health, until today she had syncopal episode at home, BLE weakness, slid down wall she thinks.  Following syncopal episode she had chest "discomfort", R shoulder pain and L pinky pain that is ongoing now at this time.  Also had N/V/D.  Patient seen in [facility] ED."  Hospital Course:  Summary of her active problems in the hospital is as following. 1. NSTEMI.   Troponin peak of 7.48, nonischemic s/p DES to proximal/mid LAD (6/1), continue home xarelto, added clopidogrel. Holding metoprolol and Resuming diltiazem due to #2  2. Symptomatic sinus pause. Witnessed LOC by nursing and myself on 6/2, no recurrent pauses overnight in ICU. ? Related to vasovagal given abdominal pain and syncope prior to admission. Monitored in ICU overnight given recurrent symptomatic pauses and witnessed seizure. Continue holding toprol.  3. Syncope. Unwitnessed falls prior to admission that occur while on the way to bathroom with dizziness and controlled fall/slode to ground per patient( x 2 no LOC or head trauma or abdominal trauma)Initially thought related to NSTEMI; however sinus pause could be contributing discontinue nodal blockers(dilt, toprol).   4. New onset seizure. Witnessed by myself and nursing on 6/2 with tonic-clonic activity and left head term and bilateral arm  extension,  post ictal period with stool and urine incontinence. Loaded with keppra, no repeat seizure activity. CT head nonacute, seizure could lead to bradycardia but unclear correlation ( sinus pause--hypotension--> seizure?). Neurology FOLLOW UP on keppra, EEG unremarkable for any active seizures.  5. Splenic injury. Abd xr and CT abd obtained for work up of Left abdominal pain. Found to have spleen injury with small subcapsular hematoma with no sign of extravasation. Denies  abdominal injury in falls prior to admission, hgb stable, close monitoring, pain control  6. Confusion and difficulty following commands. Occurred on 6/2 shortly after returning from cath, resolved now. Completely following all commands, no focal deficits, completely oriented does have history of stroke,  no residual weakness, CT head neg for acute. Likely related to postictal state  7. Paroxysmal AF. Back in RVR and resumed cardizem. Holding BB given sinus pause. Already on xarelto at home Monitor on telemetry  8. Prior stroke hx. Old Right parietal infarct on CT head. No residual deficits on exam   9. Abnormal UA. No dysuria. UA(t [facility]) trace Leuks, pos nitrites, 11-20 WBC. [facility] at [facility]. cx negative, no symptoms here  10. Chronic diarrhea. States hx of IBS, on welchol for. No diarhea here.   11. Hypothyroidism. Continue Synthroid  12. Hypertension. Stable. Continue lisinopril. Hold BB and CCB given symptomatic sinus Pause. Presented with syncope and occurred after LHC witnessed by nursing. Concer. Initially presumed related to above but now  13. Hyperlipidemia. LDL 121. Started atorvastatin  Patient was seen by physical therapy, who recommended SNF, which was arranged by Child psychotherapistsocial worker. On the day of the discharge the patient's vitals were stable , and no other acute medical condition were reported by patient. the patient was felt safe to be discharge at SNF with therapy.  Consultants: cardiology, neurology, PCCM  Procedures: EEG Echocardiogram  Cardiac Catheterization    DISCHARGE MEDICATION: Allergies as of 07/18/2018      Reactions   Codeine Other (See Comments)   Unknown reaction   Penicillins Other (See Comments)   Unknown reaction Did it involve swelling of the face/tongue/throat, SOB, or low BP? Unknown Did it involve sudden or severe rash/hives, skin peeling, or any reaction on the inside of your mouth or nose? Unknown Did you need to seek medical  attention at a hospital or doctor's office? Unknown When did it last happen?unknown (long time ago) If all above answers are NO, may proceed with cephalosporin use.      Medication List    STOP taking these medications   colesevelam 625 MG tablet Commonly known as:  WELCHOL   furosemide 20 MG tablet Commonly known as:  LASIX   memantine 10 MG tablet Commonly known as:  NAMENDA   metoprolol succinate 50 MG 24 hr tablet Commonly known as:  TOPROL-XL     TAKE these medications   atorvastatin 40 MG tablet Commonly known as:  LIPITOR Take 1 tablet (40 mg total) by mouth daily at 6 PM.   clopidogrel 75 MG tablet Commonly known as:  PLAVIX Take 1 tablet (75 mg total) by mouth daily with breakfast. Start taking on:  July 19, 2018   diltiazem 180 MG 24 hr capsule Commonly known as:  CARDIZEM CD Take 180 mg by mouth daily.   ergocalciferol 1.25 MG (50000 UT) capsule Commonly known as:  VITAMIN D2 Take 50,000 Units by mouth every Wednesday.   ezetimibe 10 MG tablet Commonly known as:  ZETIA Take 10 mg by mouth at bedtime.  GLUCOSAMINE 1500 COMPLEX PO Take 1 capsule by mouth daily at 12 noon.   glucose blood test strip 1 each by Other route as needed. Use as instructed   levETIRAcetam 500 MG tablet Commonly known as:  KEPPRA Take 1 tablet (500 mg total) by mouth 2 (two) times daily.   levothyroxine 75 MCG tablet Commonly known as:  SYNTHROID Take 75 mcg by mouth daily before breakfast.   lisinopril 40 MG tablet Commonly known as:  ZESTRIL Take 40 mg by mouth at bedtime.   montelukast 10 MG tablet Commonly known as:  SINGULAIR Take 10 mg by mouth at bedtime.   nitroGLYCERIN 0.4 MG SL tablet Commonly known as:  NITROSTAT Place 0.4 mg under the tongue every 5 (five) minutes as needed for chest pain.   PRENATAL VITAMIN PO Take 1 tablet by mouth daily at 12 noon.   PROBIOTIC PO Take 1 tablet by mouth daily.   Rivaroxaban 15 MG Tabs tablet Commonly  known as:  XARELTO Take 1 tablet (15 mg total) by mouth daily with supper. What changed:    medication strength  how much to take      Allergies  Allergen Reactions   Codeine Other (See Comments)    Unknown reaction   Penicillins Other (See Comments)    Unknown reaction Did it involve swelling of the face/tongue/throat, SOB, or low BP? Unknown Did it involve sudden or severe rash/hives, skin peeling, or any reaction on the inside of your mouth or nose? Unknown Did you need to seek medical attention at a hospital or doctor's office? Unknown When did it last happen?unknown (long time ago) If all above answers are NO, may proceed with cephalosporin use.   Discharge Instructions    Amb Referral to Cardiac Rehabilitation   Complete by:  As directed    Referring to [place] CRP 2   Diagnosis:   Coronary Stents NSTEMI     After initial evaluation and assessments completed: Virtual Based Care may be provided alone or in conjunction with Phase 2 Cardiac Rehab based on patient barriers.:  Yes   Ambulatory referral to Neurology   Complete by:  As directed    An appointment is requested in approximately: 4 weeks   Diet - low sodium heart healthy   Complete by:  As directed    Discharge instructions   Complete by:  As directed    It is important that you read the given instructions as well as go over your medication list with RN to help you understand your care after this hospitalization.  Discharge Instructions: Please follow-up with PCP in 1-2 weeks  Please request your primary care physician to go over all Hospital Tests and Procedure/Radiological results at the follow up. Please get all Hospital records sent to your PCP by signing hospital release before you go home.   Do not drive, operating heavy machinery, perform activities at heights, swimming or participation in water activities or provide baby sitting services because you were admitted for seizures; until you have  been seen by Primary Care Physician or a Neurologist and advised to do so again. Do not take more than prescribed Pain, Sleep and Anxiety Medications. You were cared for by a hospitalist during your hospital stay. If you have any questions about your discharge medications or the care you received while you were in the hospital after you are discharged, you can call the unit @UNIT @ you were admitted to and ask to speak with the hospitalist on call if  the hospitalist that took care of you is not available.  Once you are discharged, your primary care physician will handle any further medical issues. Please note that NO REFILLS for any discharge medications will be authorized once you are discharged, as it is imperative that you return to your primary care physician (or establish a relationship with a primary care physician if you do not have one) for your aftercare needs so that they can reassess your need for medications and monitor your lab values. You Must read complete instructions/literature along with all the possible adverse reactions/side effects for all the Medicines you take and that have been prescribed to you. Take any new Medicines after you have completely understood and accept all the possible adverse reactions/side effects. Wear Seat belts while driving. If you have smoked or chewed Tobacco in the last 2 yrs please stop smoking and/or stop any Recreational drug use.  If you drink alcohol, please moderate the use and do not drive, operating heavy machinery, perform activities at heights, swimming or participation in water activities or provide baby sitting services under influence.   Driving Restrictions   Complete by:  As directed    Per [facility] [place] DM[place] statutes, patients with seizures are not allowed to drive until  they have been seizure-free for six months.  Use caution when using heavy equipment or power tools.  Avoid working on ladders or at heights.  Take showers instead of baths.  Ensure the water temperature is not too high on the home water heater. Do not go swimming alone.  When caring for infants or small children, sit down when holding, feeding, or changing them to minimize risk of injury to the child in the event you have a seizure.  Maintain good sleep hygiene. Avoid alcohol.   Increase activity slowly   Complete by:  As directed      Discharge Exam: Filed Weights   07/16/18 0528 07/17/18 0640 07/18/18 0041  Weight: 56.2 kg 56.9 kg 57.3 kg   Vitals:   07/17/18 2029 07/18/18 1131  BP: 113/79 (!) 155/67  Pulse: 93 76  Resp: 20 20  Temp: (!) 97.5 F (36.4 C) 98.2 F (36.8 C)  SpO2: 93% 97%   General: Appear in no distress, no Rash; Oral Mucosa Clear, moist. no Abnormal Mass Or lumps Cardiovascular: S1 and S2 Present, no Murmur, Respiratory: normal respiratory effort, Bilateral Air entry present and Clear to Auscultation, no Crackles, no wheezes Abdomen: Bowel Sound present, Soft and no tenderness, no hernia Extremities: no Pedal edema, no calf tenderness Neurology: alert and oriented to time, place, and person affect appropriate. normal without focal findings, mental status, speech normal, alert and oriented x3, PERLA, Motor strength 5/5 and symmetric and sensation grossly normal to light touch   The results of significant diagnostics from this hospitalization (including imaging, microbiology, ancillary and laboratory) are listed below for reference.    Significant Diagnostic Studies: Ct Head Wo Contrast  Result Date: 07/14/2018 CLINICAL DATA:  New onset seizure EXAM: CT HEAD WITHOUT CONTRAST TECHNIQUE: Contiguous axial images were obtained from the base of the skull through the vertex without intravenous contrast. COMPARISON:  Head CT 07/12/2018 FINDINGS: Brain: There is no mass, hemorrhage or extra-axial collection. There is generalized atrophy without lobar predilection. Hypodensity of the white matter is most commonly associated with chronic  microvascular disease. There is an old left parietal infarct, unchanged. Vascular: No abnormal hyperdensity of the major intracranial arteries or dural venous sinuses. No intracranial atherosclerosis. Skull: The visualized  skull base, calvarium and extracranial soft tissues are normal. Sinuses/Orbits: Partial left fronto ethmoid opacification. No mastoid or middle ear effusion. The orbits are normal. IMPRESSION: 1. No acute intracranial abnormality. 2. Chronic small vessel ischemia and old right parietal infarct. Electronically Signed   By: Deatra Robinson M.D.   On: 07/14/2018 01:26   Ct Abdomen Pelvis W Contrast  Result Date: 07/14/2018 CLINICAL DATA:  85 year old female with abdominal pain and fever. Concern for abscess. EXAM: CT ABDOMEN AND PELVIS WITH CONTRAST TECHNIQUE: Multidetector CT imaging of the abdomen and pelvis was performed using the standard protocol following bolus administration of intravenous contrast. CONTRAST:  OMNIPAQUE IOHEXOL 300 MG/ML  SOLN COMPARISON:  CT of the abdomen pelvis dated 09/01/2016 FINDINGS: Evaluation of this exam is limited due to respiratory motion artifact. Lower chest: The visualized lung bases are clear. There is coronary vascular calcification as well as calcification of the mitral annulus. No intra-abdominal free air or free fluid. Hepatobiliary: The liver is unremarkable. No intrahepatic biliary ductal dilatation. Cholecystectomy. There is mild dilatation of the central CBD, likely post cholecystectomy. No calcified stone noted in the central CBD. Pancreas: Unremarkable. No pancreatic ductal dilatation or surrounding inflammatory changes. Spleen: There is a focal area of hypoenhancement in the mid to upper pole of the spleen measuring up to 22 x 18 mm likely an area of splenic infarct or laceration. There is a small amount of slightly higher attenuating fluid along the lateral aspect of the spleen which may represent a subacute perisplenic or subcapsular  hematoma. No extravasation of contrast to suggest active bleed. Adrenals/Urinary Tract: The adrenal glands are unremarkable. There is no hydronephrosis on either side. Areas of renal cortical scarring noted bilaterally. There is symmetric enhancement and excretion of contrast by both kidneys. The visualized ureters appear unremarkable. Excreted contrast is noted within the urinary bladder. Evaluation of the bladder is somewhat limited due to presence of high attenuating contrast. Stomach/Bowel: There is sigmoid diverticulosis without active inflammatory changes. Evaluation of the bowel is limited due to respiratory motion artifact. There is no bowel obstruction or active inflammation. Appendectomy. Vascular/Lymphatic: Advanced aortoiliac atherosclerotic disease. No portal venous gas. There is no adenopathy. Reproductive: The uterus and ovaries are grossly unremarkable. Other: None Musculoskeletal: Osteopenia with degenerative changes of the spine. No acute osseous pathology. IMPRESSION: 1. Probable focal area of traumatic splenic injury/laceration with a small perisplenic or subcapsular hematoma. No extravasation of contrast or evidence of active bleed. 2. Sigmoid diverticulosis. No bowel obstruction or active inflammation. Electronically Signed   By: Elgie Collard M.D.   On: 07/14/2018 01:33   Dg Abd Portable 2v  Result Date: 07/13/2018 CLINICAL DATA:  85 year old female with abdominal pain. Altered mental status. EXAM: PORTABLE ABDOMEN - 2 VIEW COMPARISON:  CT of the abdomen pelvis dated 09/01/2016 FINDINGS: There is no bowel dilatation or evidence of obstruction. No free air identified. Excreted intravenous contrast in the renal collecting system and bladder noted. Right upper quadrant cholecystectomy clips. There is degenerative changes of the spine. No acute osseous pathology. IMPRESSION: No acute findings by radiograph. Electronically Signed   By: Elgie Collard M.D.   On: 07/13/2018 19:51     Microbiology: Recent Results (from the past 240 hour(s))  Culture, Urine     Status: None   Collection Time: 07/13/18  6:48 AM  Result Value Ref Range Status   Specimen Description URINE, RANDOM  Final   Special Requests NONE  Final   Culture   Final    NO  GROWTH Performed at [facility], [address], [place], [place] [address]    Report Status 07/14/2018 FINAL  Final  MRSA PCR Screening     Status: None   Collection Time: 07/14/18  9:18 AM  Result Value Ref Range Status   MRSA by PCR NEGATIVE NEGATIVE Final    Comment:        The GeneXpert MRSA Assay (FDA approved for NASAL specimens only), is one component of a comprehensive MRSA colonization surveillance program. It is not intended to diagnose MRSA infection nor to guide or monitor treatment for MRSA infections. Performed at [facility], [address] [place], [place] [address]   Novel Coronavirus, NAA (hospital order; send-out to ref lab)     Status: None   Collection Time: 07/16/18 11:54 AM  Result Value Ref Range Status   SARS-CoV-2, NAA NOT DETECTED NOT DETECTED Final    Comment: (NOTE) This test was developed and its performance characteristics determined by [employer]. This test has not been FDA cleared or approved. This test has been authorized by FDA under an Emergency Use Authorization (EUA). This test is only authorized for the duration of time the declaration that circumstances exist justifying the authorization of the emergency use of in vitro diagnostic tests for detection of SARS-CoV-2 virus and/or diagnosis of COVID-19 infection under section 564(b)(1) of the Act, 21 U.S.C. 098JXB-1(Y)(7), unless the authorization is terminated or revoked sooner. When diagnostic testing is negative, the possibility of a false negative result should be considered in the context of a patient's recent exposures and the presence of clinical signs and symptoms consistent with COVID-19. An  individual without symptoms of COVID-19 and who is not shedding SARS-CoV-2 virus would expect to have a negative (not detected) result in this assay. Performed  At: [facility] [address] [place], [place] [id number] Jolene Schimke MD QM:[id number]    Coronavirus Source NASOPHARYNGEAL  Final    Comment: Performed at [facility], [address] [place], [place] [address]     Labs: CBC: Recent Labs  Lab 07/13/18 2048 07/14/18 0746 07/15/18 0503 07/16/18 0444 07/17/18 0413  WBC 7.7 6.4 5.3 6.0 5.7  NEUTROABS 6.1 4.4  --   --   --   HGB 11.3* 10.6* 10.1* 10.2* 9.9*  HCT 33.9* 31.8* 30.2* 30.7* 29.7*  MCV 90.2 89.8 89.9 89.5 90.0  PLT 121* 121* 117* 110* 151   Basic Metabolic Panel: Recent Labs  Lab 07/13/18 0811 07/13/18 2048 07/14/18 0746 07/16/18 0638  NA 132* 133* 135 133*  K 3.5 3.6 3.2* 4.2  CL 99 99 101 99  CO2 21* GLUCOSE 138* 150* 113* 128*  BUN 7* 7* 7* 10  CREATININE 0.65 0.56 0.72 0.74  CALCIUM 8.7* 8.9 8.7* 8.8*  MG  --  1.6* 2.5*  --   PHOS  --  3.7 3.5  --    Liver Function Tests: Recent Labs  Lab 07/13/18 0811 07/13/18 2048  AST 92* 78*  ALT 38 38  ALKPHOS 58 58  BILITOT 0.9 1.0  PROT 6.4* 6.9  ALBUMIN 3.4* 3.6   Recent Labs  Lab 07/13/18 2048  LIPASE 21  AMYLASE 12*   Recent Labs  Lab 07/13/18 2048  AMMONIA 14   Cardiac Enzymes: Recent Labs  Lab 07/12/18 1959 07/13/18 0234 07/13/18 0811  TROPONINI 4.12* 7.48* 7.17*   BNP (last 3 results) No results for input(s): BNP in the last 8760 hours. CBG:  Recent Labs  Lab 07/17/18 0634 07/17/18 1112 07/17/18 1646 07/17/18 2103 07/18/18 0621  GLUCAP 139* 167* 156* 110* 142*   Time spent: 35 minutes  Signed:  Lynden OxfordPranav   [facility]  07/18/2018

## 2018-07-18 NOTE — Progress Notes (Signed)
Progress Note  Patient Name: Elaine Mathews Date of Encounter: 07/18/2018  Primary Cardiologist: Jyl Heinz, MD  Subjective   Up ambulating in room.  States that she feels well, no chest pain, palpitations, lightheadedness, or shortness of breath.  Inpatient Medications    Scheduled Meds: . atorvastatin  40 mg Oral q1800  . clopidogrel  75 mg Oral Q breakfast  . diltiazem  180 mg Oral Daily  . insulin aspart  0-9 Units Subcutaneous TID WC & HS  . levETIRAcetam  500 mg Oral BID  . levothyroxine  75 mcg Oral Q0600  . lisinopril  40 mg Oral QHS  . rivaroxaban  15 mg Oral Q supper  . sodium chloride flush  3 mL Intravenous Q12H  . sodium chloride flush  3 mL Intravenous Q12H   Continuous Infusions: . sodium chloride Stopped (07/13/18 2039)   PRN Meds: sodium chloride, acetaminophen, ondansetron (ZOFRAN) IV, sodium chloride flush   Vital Signs    Vitals:   07/17/18 0640 07/17/18 1320 07/17/18 2029 07/18/18 0041  BP:  111/86 113/79   Pulse:  89 93   Resp:  16 20   Temp:  98 F (36.7 C) (!) 97.5 F (36.4 C)   TempSrc:  Oral Oral   SpO2:  95% 93%   Weight: 56.9 kg   57.3 kg  Height:        Intake/Output Summary (Last 24 hours) at 07/18/2018 1022 Last data filed at 07/18/2018 0900 Gross per 24 hour  Intake 720 ml  Output 1200 ml  Net -480 ml   Filed Weights   07/16/18 0528 07/17/18 0640 07/18/18 0041  Weight: 56.2 kg 56.9 kg 57.3 kg    Telemetry    Sinus rhythm.  Personally reviewed.  ECG    Tracing from 07/15/2018 showed rapid atrial fibrillation with incomplete right bundle branch block.  Personally reviewed.  Physical Exam   GEN:  Elderly woman.  No acute distress.   Neck: No JVD. Cardiac: RRR, no gallop.  Respiratory: Nonlabored. Clear to auscultation bilaterally. GI: Soft, nontender, bowel sounds present. MS: No edema; No deformity. Neuro:  Nonfocal. Psych: Alert and oriented x 3. Normal affect.  Labs    Chemistry Recent Labs  Lab 07/13/18  [phone] 07/13/18 2048 07/14/18 0746 07/16/18 0638  NA 132* 133* 135 133*  K 3.5 3.6 3.2* 4.2  CL 99 99 101 99  CO2 21* 24 23 23   GLUCOSE 138* 150* 113* 128*  BUN 7* 7* 7* 10  CREATININE 0.65 0.56 0.72 0.74  CALCIUM 8.7* 8.9 8.7* 8.8*  PROT 6.4* 6.9  --   --   ALBUMIN 3.4* 3.6  --   --   AST 92* 78*  --   --   ALT 38 38  --   --   ALKPHOS 58 58  --   --   BILITOT 0.9 1.0  --   --   GFRNONAA >60 >60 >60 >60  GFRAA >60 >60 >60 >60  ANIONGAP 12 10 11 11      Hematology Recent Labs  Lab 07/15/18 0503 07/16/18 0444 07/17/18 0413  WBC 5.3 6.0 5.7  RBC 3.36* 3.43* 3.30*  HGB 10.1* 10.2* 9.9*  HCT 30.2* 30.7* 29.7*  MCV 89.9 89.5 90.0  MCH 30.1 29.7 30.0  MCHC 33.4 33.2 33.3  RDW 13.4 13.6 13.7  PLT 117* 110* 151    Cardiac Enzymes Recent Labs  Lab 07/12/18 1959 07/13/18 0234 07/13/18 0811  TROPONINI 4.12* 7.48* 7.17*  No results for input(s): TROPIPOC in the last 168 hours.   Radiology    No results found.  Cardiac Studies   Cardiac catheterization 07/13/2018: Conclusions: 1. Two vessel coronary artery disease with multifocal LAD (40% proximal, 60-70% mid, and 80% apical stenoses). Proximal and mid LAD disease is highly significant by DFR (0.74). Culpril lesion for NSTEMI is likely occluded small OM2 branch, which is too small for PCI. 2. Normal left ventricular systolic function with mildly elevated filling pressure (LVEDP 15-20 mmHg). 3. Successful DFR-guided PCI to the proximal/mid LAD using a Resolute Onyx 2.75 x 18 mm drug-eluting stent with 0% residual stenosis and TIMI-3 flow.  Recommendations 1. Medical therapy for occluded OM2 branch and apical LAD disease. 2. Restart heparin 2 hours after TR band removal, given history of paroxysmal atrial fibrillation. If no bleeding complications, recommend discontinuation of aspirin tomorrow and initiation of rivaroxaban 20 mg daily. Rivaroxaban and clopidogrel should be continued for at least 12 months, if  possible. 3. Aggressive secondary prevention.  Patient Profile     85 y.o. female history of paroxysmal atrial fibrillation, previous stroke, hypertension, type 2 diabetes mellitus, hyperlipidemia, and hypothyroidism presented to the hospital after a syncopal event and was subsequently diagnosed with NSTEMI.  Culprit lesion felt to be small obtuse marginal however she also had significant proximal to mid LAD stenosis that was treated with DES.  Assessment & Plan    1.  NSTEMI, culprit lesion felt to be likely a small OM 2 branch.  Residual disease included significant proximal to mid LAD stenosis that was treated with DES.  She is on Plavix although not aspirin given concurrent use of Xarelto.  2.  Paroxysmal atrial fibrillation with sinus pause noted.  She is actually back in sinus rhythm at this time and has tolerated Cardizem CD 880 mg daily.  We will hold off resuming beta-blocker.  She has had no recurrent pauses, no clear indication for pacemaker at this time.  Chart review indicates suspicion for vaguely mediated pause as well.  3.  Mixed hyperlipidemia, on statin therapy.  Recent LDL 121.  4.  Essential hypertension on lisinopril.  [facility] will sign off.   Medication Recommendations:  Would continue Xarelto at current dose, Plavix, Cardizem CD, Lipitor, and lisinopril. Other recommendations (labs, testing, etc):  If patient experiences further episodes of lightheadedness or syncope, recommend outpatient event recorder. Follow up as an outpatient: Should follow-up with Dr. Tomie China in 7 to 10 days.   Signed, Nona Dell, MD  07/18/2018, 10:22 AM

## 2018-07-18 NOTE — TOC Transition Note (Signed)
Transition of Care [facility]) - CM/SW Discharge Note   Patient Details  Name: Elaine Mathews MRN: [id number] Date of Birth: Feb 07, 1934  Transition of Care [facility]) CM/SW Contact:  Bary Castilla, LCSW Phone Number: 07/18/2018, 12:45 PM   Clinical Narrative:     Patient will DC to:[facility] date:?07/18/18 Family notified:?Erin Granddaughter Transport YP:[id number]   Per MD patient ready for DC to [facility].?. RN, patient, patient's family, and facility notified of DC. Discharge Summary sent to facility. RN given number for report   [phone]. Marland Kitchen DC packet on chart. Ambulance transport requested for patient.  CSW signing off.   [place], [place] [phone]     Final next level of care: Skilled Nursing Facility Barriers to Discharge: No Barriers Identified   Patient Goals and CMS Choice Patient states their goals for this hospitalization and ongoing recovery are:: to return home CMS Medicare.gov Compare Post Acute Care list provided to:: Patient Represenative (must comment)(Granddaughter Erin) Choice offered to / list presented to : (Patient Granddaughter)  Discharge Placement              Patient chooses bed at: [facility]) Patient to be transferred to facility by: [facility] Name of family member notified: Granddaughter ERin Patient and family notified of of transfer: 07/18/18  Discharge Plan and Services     Post Acute Care Choice: [place]                               Social Determinants of Health (SDOH) Interventions     Readmission Risk Interventions No flowsheet data found.

## 2018-07-20 DIAGNOSIS — I119 Hypertensive heart disease without heart failure: Secondary | ICD-10-CM | POA: Diagnosis not present

## 2018-07-20 DIAGNOSIS — R569 Unspecified convulsions: Secondary | ICD-10-CM | POA: Diagnosis not present

## 2018-07-20 DIAGNOSIS — I480 Paroxysmal atrial fibrillation: Secondary | ICD-10-CM | POA: Diagnosis not present

## 2018-07-20 DIAGNOSIS — I214 Non-ST elevation (NSTEMI) myocardial infarction: Secondary | ICD-10-CM | POA: Diagnosis not present

## 2018-07-27 DIAGNOSIS — E039 Hypothyroidism, unspecified: Secondary | ICD-10-CM | POA: Diagnosis not present

## 2018-07-27 DIAGNOSIS — R569 Unspecified convulsions: Secondary | ICD-10-CM | POA: Diagnosis not present

## 2018-07-27 DIAGNOSIS — I480 Paroxysmal atrial fibrillation: Secondary | ICD-10-CM | POA: Diagnosis not present

## 2018-07-27 DIAGNOSIS — I214 Non-ST elevation (NSTEMI) myocardial infarction: Secondary | ICD-10-CM | POA: Diagnosis not present

## 2018-07-28 ENCOUNTER — Other Ambulatory Visit: Payer: Self-pay

## 2018-07-28 ENCOUNTER — Telehealth: Payer: Medicare Other | Admitting: Cardiology

## 2018-07-30 DIAGNOSIS — I480 Paroxysmal atrial fibrillation: Secondary | ICD-10-CM | POA: Diagnosis not present

## 2018-07-30 DIAGNOSIS — R0989 Other specified symptoms and signs involving the circulatory and respiratory systems: Secondary | ICD-10-CM | POA: Diagnosis not present

## 2018-08-03 DIAGNOSIS — I214 Non-ST elevation (NSTEMI) myocardial infarction: Secondary | ICD-10-CM | POA: Diagnosis not present

## 2018-08-03 DIAGNOSIS — I480 Paroxysmal atrial fibrillation: Secondary | ICD-10-CM | POA: Diagnosis not present

## 2018-08-03 DIAGNOSIS — E871 Hypo-osmolality and hyponatremia: Secondary | ICD-10-CM | POA: Diagnosis not present

## 2018-08-03 DIAGNOSIS — J156 Pneumonia due to other aerobic Gram-negative bacteria: Secondary | ICD-10-CM | POA: Diagnosis not present

## 2018-08-06 ENCOUNTER — Other Ambulatory Visit: Payer: Self-pay

## 2018-08-06 NOTE — Patient Outreach (Signed)
[facility] [facility]) Care Management  08/06/2018  Elaine Mathews [birth year] [id number]   Medication Adherence call to Elaine Mathews patient did not answer patient is past due on Lisinopril 40 mg under [place].   [place] Management Direct Dial [phone]  Fax [phone] [email or web address]

## 2018-08-07 DIAGNOSIS — N390 Urinary tract infection, site not specified: Secondary | ICD-10-CM | POA: Diagnosis not present

## 2018-08-10 DIAGNOSIS — J156 Pneumonia due to other aerobic Gram-negative bacteria: Secondary | ICD-10-CM | POA: Diagnosis not present

## 2018-08-10 DIAGNOSIS — I480 Paroxysmal atrial fibrillation: Secondary | ICD-10-CM | POA: Diagnosis not present

## 2018-08-10 DIAGNOSIS — I214 Non-ST elevation (NSTEMI) myocardial infarction: Secondary | ICD-10-CM | POA: Diagnosis not present

## 2018-08-10 DIAGNOSIS — R569 Unspecified convulsions: Secondary | ICD-10-CM | POA: Diagnosis not present

## 2018-08-14 DIAGNOSIS — D649 Anemia, unspecified: Secondary | ICD-10-CM | POA: Diagnosis not present

## 2018-08-14 DIAGNOSIS — E871 Hypo-osmolality and hyponatremia: Secondary | ICD-10-CM | POA: Diagnosis not present

## 2018-08-17 DIAGNOSIS — I480 Paroxysmal atrial fibrillation: Secondary | ICD-10-CM | POA: Diagnosis not present

## 2018-08-17 DIAGNOSIS — I214 Non-ST elevation (NSTEMI) myocardial infarction: Secondary | ICD-10-CM | POA: Diagnosis not present

## 2018-08-17 DIAGNOSIS — E039 Hypothyroidism, unspecified: Secondary | ICD-10-CM | POA: Diagnosis not present

## 2018-08-17 DIAGNOSIS — R569 Unspecified convulsions: Secondary | ICD-10-CM | POA: Diagnosis not present

## 2018-08-19 DIAGNOSIS — R2681 Unsteadiness on feet: Secondary | ICD-10-CM | POA: Diagnosis not present

## 2018-08-19 DIAGNOSIS — R569 Unspecified convulsions: Secondary | ICD-10-CM | POA: Diagnosis not present

## 2018-08-19 DIAGNOSIS — E039 Hypothyroidism, unspecified: Secondary | ICD-10-CM | POA: Diagnosis not present

## 2018-08-19 DIAGNOSIS — M6281 Muscle weakness (generalized): Secondary | ICD-10-CM | POA: Diagnosis not present

## 2018-08-19 DIAGNOSIS — R2689 Other abnormalities of gait and mobility: Secondary | ICD-10-CM | POA: Diagnosis not present

## 2018-08-19 DIAGNOSIS — Z87891 Personal history of nicotine dependence: Secondary | ICD-10-CM | POA: Diagnosis not present

## 2018-08-19 DIAGNOSIS — E1140 Type 2 diabetes mellitus with diabetic neuropathy, unspecified: Secondary | ICD-10-CM | POA: Diagnosis not present

## 2018-08-19 DIAGNOSIS — I2510 Atherosclerotic heart disease of native coronary artery without angina pectoris: Secondary | ICD-10-CM | POA: Diagnosis not present

## 2018-08-19 DIAGNOSIS — I482 Chronic atrial fibrillation, unspecified: Secondary | ICD-10-CM | POA: Diagnosis not present

## 2018-08-19 DIAGNOSIS — K589 Irritable bowel syndrome without diarrhea: Secondary | ICD-10-CM | POA: Diagnosis not present

## 2018-08-19 DIAGNOSIS — I249 Acute ischemic heart disease, unspecified: Secondary | ICD-10-CM | POA: Diagnosis not present

## 2018-08-19 DIAGNOSIS — Z602 Problems related to living alone: Secondary | ICD-10-CM | POA: Diagnosis not present

## 2018-08-19 DIAGNOSIS — E7800 Pure hypercholesterolemia, unspecified: Secondary | ICD-10-CM | POA: Diagnosis not present

## 2018-08-19 DIAGNOSIS — Z7982 Long term (current) use of aspirin: Secondary | ICD-10-CM | POA: Diagnosis not present

## 2018-08-19 DIAGNOSIS — K219 Gastro-esophageal reflux disease without esophagitis: Secondary | ICD-10-CM | POA: Diagnosis not present

## 2018-08-19 DIAGNOSIS — I252 Old myocardial infarction: Secondary | ICD-10-CM | POA: Diagnosis not present

## 2018-08-19 DIAGNOSIS — Z9049 Acquired absence of other specified parts of digestive tract: Secondary | ICD-10-CM | POA: Diagnosis not present

## 2018-08-19 DIAGNOSIS — Z7901 Long term (current) use of anticoagulants: Secondary | ICD-10-CM | POA: Diagnosis not present

## 2018-08-19 DIAGNOSIS — Z9181 History of falling: Secondary | ICD-10-CM | POA: Diagnosis not present

## 2018-08-19 DIAGNOSIS — G932 Benign intracranial hypertension: Secondary | ICD-10-CM | POA: Diagnosis not present

## 2018-08-19 DIAGNOSIS — Z8673 Personal history of transient ischemic attack (TIA), and cerebral infarction without residual deficits: Secondary | ICD-10-CM | POA: Diagnosis not present

## 2018-08-19 DIAGNOSIS — Z955 Presence of coronary angioplasty implant and graft: Secondary | ICD-10-CM | POA: Diagnosis not present

## 2018-08-19 DIAGNOSIS — I10 Essential (primary) hypertension: Secondary | ICD-10-CM | POA: Diagnosis not present

## 2018-08-22 DIAGNOSIS — E1140 Type 2 diabetes mellitus with diabetic neuropathy, unspecified: Secondary | ICD-10-CM | POA: Diagnosis not present

## 2018-08-22 DIAGNOSIS — E039 Hypothyroidism, unspecified: Secondary | ICD-10-CM | POA: Diagnosis not present

## 2018-08-22 DIAGNOSIS — E7800 Pure hypercholesterolemia, unspecified: Secondary | ICD-10-CM | POA: Diagnosis not present

## 2018-08-22 DIAGNOSIS — K219 Gastro-esophageal reflux disease without esophagitis: Secondary | ICD-10-CM | POA: Diagnosis not present

## 2018-08-22 DIAGNOSIS — Z87891 Personal history of nicotine dependence: Secondary | ICD-10-CM | POA: Diagnosis not present

## 2018-08-22 DIAGNOSIS — K589 Irritable bowel syndrome without diarrhea: Secondary | ICD-10-CM | POA: Diagnosis not present

## 2018-08-22 DIAGNOSIS — I2510 Atherosclerotic heart disease of native coronary artery without angina pectoris: Secondary | ICD-10-CM | POA: Diagnosis not present

## 2018-08-22 DIAGNOSIS — Z8673 Personal history of transient ischemic attack (TIA), and cerebral infarction without residual deficits: Secondary | ICD-10-CM | POA: Diagnosis not present

## 2018-08-22 DIAGNOSIS — I252 Old myocardial infarction: Secondary | ICD-10-CM | POA: Diagnosis not present

## 2018-08-22 DIAGNOSIS — Z9181 History of falling: Secondary | ICD-10-CM | POA: Diagnosis not present

## 2018-08-22 DIAGNOSIS — M6281 Muscle weakness (generalized): Secondary | ICD-10-CM | POA: Diagnosis not present

## 2018-08-22 DIAGNOSIS — Z9049 Acquired absence of other specified parts of digestive tract: Secondary | ICD-10-CM | POA: Diagnosis not present

## 2018-08-22 DIAGNOSIS — R2689 Other abnormalities of gait and mobility: Secondary | ICD-10-CM | POA: Diagnosis not present

## 2018-08-22 DIAGNOSIS — Z7982 Long term (current) use of aspirin: Secondary | ICD-10-CM | POA: Diagnosis not present

## 2018-08-22 DIAGNOSIS — Z955 Presence of coronary angioplasty implant and graft: Secondary | ICD-10-CM | POA: Diagnosis not present

## 2018-08-22 DIAGNOSIS — I10 Essential (primary) hypertension: Secondary | ICD-10-CM | POA: Diagnosis not present

## 2018-08-22 DIAGNOSIS — I249 Acute ischemic heart disease, unspecified: Secondary | ICD-10-CM | POA: Diagnosis not present

## 2018-08-22 DIAGNOSIS — G932 Benign intracranial hypertension: Secondary | ICD-10-CM | POA: Diagnosis not present

## 2018-08-22 DIAGNOSIS — I482 Chronic atrial fibrillation, unspecified: Secondary | ICD-10-CM | POA: Diagnosis not present

## 2018-08-22 DIAGNOSIS — R569 Unspecified convulsions: Secondary | ICD-10-CM | POA: Diagnosis not present

## 2018-08-22 DIAGNOSIS — Z7901 Long term (current) use of anticoagulants: Secondary | ICD-10-CM | POA: Diagnosis not present

## 2018-08-22 DIAGNOSIS — Z602 Problems related to living alone: Secondary | ICD-10-CM | POA: Diagnosis not present

## 2018-08-22 DIAGNOSIS — R2681 Unsteadiness on feet: Secondary | ICD-10-CM | POA: Diagnosis not present

## 2018-08-25 DIAGNOSIS — Z7901 Long term (current) use of anticoagulants: Secondary | ICD-10-CM | POA: Diagnosis not present

## 2018-08-25 DIAGNOSIS — Z9181 History of falling: Secondary | ICD-10-CM | POA: Diagnosis not present

## 2018-08-25 DIAGNOSIS — I249 Acute ischemic heart disease, unspecified: Secondary | ICD-10-CM | POA: Diagnosis not present

## 2018-08-25 DIAGNOSIS — I482 Chronic atrial fibrillation, unspecified: Secondary | ICD-10-CM | POA: Diagnosis not present

## 2018-08-25 DIAGNOSIS — Z87891 Personal history of nicotine dependence: Secondary | ICD-10-CM | POA: Diagnosis not present

## 2018-08-25 DIAGNOSIS — G932 Benign intracranial hypertension: Secondary | ICD-10-CM | POA: Diagnosis not present

## 2018-08-25 DIAGNOSIS — R2681 Unsteadiness on feet: Secondary | ICD-10-CM | POA: Diagnosis not present

## 2018-08-25 DIAGNOSIS — K589 Irritable bowel syndrome without diarrhea: Secondary | ICD-10-CM | POA: Diagnosis not present

## 2018-08-25 DIAGNOSIS — Z602 Problems related to living alone: Secondary | ICD-10-CM | POA: Diagnosis not present

## 2018-08-25 DIAGNOSIS — Z8673 Personal history of transient ischemic attack (TIA), and cerebral infarction without residual deficits: Secondary | ICD-10-CM | POA: Diagnosis not present

## 2018-08-25 DIAGNOSIS — R569 Unspecified convulsions: Secondary | ICD-10-CM | POA: Diagnosis not present

## 2018-08-25 DIAGNOSIS — I10 Essential (primary) hypertension: Secondary | ICD-10-CM | POA: Diagnosis not present

## 2018-08-25 DIAGNOSIS — E039 Hypothyroidism, unspecified: Secondary | ICD-10-CM | POA: Diagnosis not present

## 2018-08-25 DIAGNOSIS — M6281 Muscle weakness (generalized): Secondary | ICD-10-CM | POA: Diagnosis not present

## 2018-08-25 DIAGNOSIS — R2689 Other abnormalities of gait and mobility: Secondary | ICD-10-CM | POA: Diagnosis not present

## 2018-08-25 DIAGNOSIS — E7800 Pure hypercholesterolemia, unspecified: Secondary | ICD-10-CM | POA: Diagnosis not present

## 2018-08-25 DIAGNOSIS — E1140 Type 2 diabetes mellitus with diabetic neuropathy, unspecified: Secondary | ICD-10-CM | POA: Diagnosis not present

## 2018-08-25 DIAGNOSIS — Z7982 Long term (current) use of aspirin: Secondary | ICD-10-CM | POA: Diagnosis not present

## 2018-08-25 DIAGNOSIS — Z9049 Acquired absence of other specified parts of digestive tract: Secondary | ICD-10-CM | POA: Diagnosis not present

## 2018-08-25 DIAGNOSIS — K219 Gastro-esophageal reflux disease without esophagitis: Secondary | ICD-10-CM | POA: Diagnosis not present

## 2018-08-25 DIAGNOSIS — Z955 Presence of coronary angioplasty implant and graft: Secondary | ICD-10-CM | POA: Diagnosis not present

## 2018-08-25 DIAGNOSIS — I2510 Atherosclerotic heart disease of native coronary artery without angina pectoris: Secondary | ICD-10-CM | POA: Diagnosis not present

## 2018-08-25 DIAGNOSIS — I252 Old myocardial infarction: Secondary | ICD-10-CM | POA: Diagnosis not present

## 2018-08-26 ENCOUNTER — Other Ambulatory Visit: Payer: Self-pay

## 2018-08-26 DIAGNOSIS — Z79899 Other long term (current) drug therapy: Secondary | ICD-10-CM | POA: Diagnosis not present

## 2018-08-26 DIAGNOSIS — I2510 Atherosclerotic heart disease of native coronary artery without angina pectoris: Secondary | ICD-10-CM | POA: Diagnosis not present

## 2018-08-26 DIAGNOSIS — E119 Type 2 diabetes mellitus without complications: Secondary | ICD-10-CM | POA: Diagnosis not present

## 2018-08-26 DIAGNOSIS — Z09 Encounter for follow-up examination after completed treatment for conditions other than malignant neoplasm: Secondary | ICD-10-CM | POA: Diagnosis not present

## 2018-08-26 DIAGNOSIS — I480 Paroxysmal atrial fibrillation: Secondary | ICD-10-CM | POA: Diagnosis not present

## 2018-08-26 NOTE — Patient Outreach (Signed)
[facility]) Care Management  08/26/2018  Elaine Mathews [birth year] [id number]   Medication Adherence call to Mrs. Elaine Mathews [facility] Identifiers Verify spoke with patient she is past due on Atorvastatin 40 mg patient explain she is taking 1 tablet daily but she receives a pill pack every month from the pharmacy and has medication at this time. Elaine Mathews is showing past due under [place].  [place] Management Direct Dial [phone]  Fax [phone] [email or web address]

## 2018-08-27 ENCOUNTER — Encounter: Payer: Self-pay | Admitting: Neurology

## 2018-09-08 DIAGNOSIS — I249 Acute ischemic heart disease, unspecified: Secondary | ICD-10-CM | POA: Diagnosis not present

## 2018-09-08 DIAGNOSIS — K589 Irritable bowel syndrome without diarrhea: Secondary | ICD-10-CM | POA: Diagnosis not present

## 2018-09-08 DIAGNOSIS — Z87891 Personal history of nicotine dependence: Secondary | ICD-10-CM | POA: Diagnosis not present

## 2018-09-08 DIAGNOSIS — G932 Benign intracranial hypertension: Secondary | ICD-10-CM | POA: Diagnosis not present

## 2018-09-08 DIAGNOSIS — R2689 Other abnormalities of gait and mobility: Secondary | ICD-10-CM | POA: Diagnosis not present

## 2018-09-08 DIAGNOSIS — Z7982 Long term (current) use of aspirin: Secondary | ICD-10-CM | POA: Diagnosis not present

## 2018-09-08 DIAGNOSIS — Z8673 Personal history of transient ischemic attack (TIA), and cerebral infarction without residual deficits: Secondary | ICD-10-CM | POA: Diagnosis not present

## 2018-09-08 DIAGNOSIS — E7800 Pure hypercholesterolemia, unspecified: Secondary | ICD-10-CM | POA: Diagnosis not present

## 2018-09-08 DIAGNOSIS — Z9181 History of falling: Secondary | ICD-10-CM | POA: Diagnosis not present

## 2018-09-08 DIAGNOSIS — E1140 Type 2 diabetes mellitus with diabetic neuropathy, unspecified: Secondary | ICD-10-CM | POA: Diagnosis not present

## 2018-09-08 DIAGNOSIS — R2681 Unsteadiness on feet: Secondary | ICD-10-CM | POA: Diagnosis not present

## 2018-09-08 DIAGNOSIS — I252 Old myocardial infarction: Secondary | ICD-10-CM | POA: Diagnosis not present

## 2018-09-08 DIAGNOSIS — Z7901 Long term (current) use of anticoagulants: Secondary | ICD-10-CM | POA: Diagnosis not present

## 2018-09-08 DIAGNOSIS — Z9049 Acquired absence of other specified parts of digestive tract: Secondary | ICD-10-CM | POA: Diagnosis not present

## 2018-09-08 DIAGNOSIS — I2510 Atherosclerotic heart disease of native coronary artery without angina pectoris: Secondary | ICD-10-CM | POA: Diagnosis not present

## 2018-09-08 DIAGNOSIS — Z955 Presence of coronary angioplasty implant and graft: Secondary | ICD-10-CM | POA: Diagnosis not present

## 2018-09-08 DIAGNOSIS — R569 Unspecified convulsions: Secondary | ICD-10-CM | POA: Diagnosis not present

## 2018-09-08 DIAGNOSIS — Z602 Problems related to living alone: Secondary | ICD-10-CM | POA: Diagnosis not present

## 2018-09-08 DIAGNOSIS — M6281 Muscle weakness (generalized): Secondary | ICD-10-CM | POA: Diagnosis not present

## 2018-09-08 DIAGNOSIS — I10 Essential (primary) hypertension: Secondary | ICD-10-CM | POA: Diagnosis not present

## 2018-09-08 DIAGNOSIS — K219 Gastro-esophageal reflux disease without esophagitis: Secondary | ICD-10-CM | POA: Diagnosis not present

## 2018-09-08 DIAGNOSIS — E039 Hypothyroidism, unspecified: Secondary | ICD-10-CM | POA: Diagnosis not present

## 2018-09-08 DIAGNOSIS — I482 Chronic atrial fibrillation, unspecified: Secondary | ICD-10-CM | POA: Diagnosis not present

## 2018-09-15 DIAGNOSIS — Z9181 History of falling: Secondary | ICD-10-CM | POA: Diagnosis not present

## 2018-09-15 DIAGNOSIS — I2510 Atherosclerotic heart disease of native coronary artery without angina pectoris: Secondary | ICD-10-CM | POA: Diagnosis not present

## 2018-09-15 DIAGNOSIS — I249 Acute ischemic heart disease, unspecified: Secondary | ICD-10-CM | POA: Diagnosis not present

## 2018-09-15 DIAGNOSIS — R2689 Other abnormalities of gait and mobility: Secondary | ICD-10-CM | POA: Diagnosis not present

## 2018-09-15 DIAGNOSIS — I10 Essential (primary) hypertension: Secondary | ICD-10-CM | POA: Diagnosis not present

## 2018-09-15 DIAGNOSIS — Z8673 Personal history of transient ischemic attack (TIA), and cerebral infarction without residual deficits: Secondary | ICD-10-CM | POA: Diagnosis not present

## 2018-09-15 DIAGNOSIS — E1140 Type 2 diabetes mellitus with diabetic neuropathy, unspecified: Secondary | ICD-10-CM | POA: Diagnosis not present

## 2018-09-15 DIAGNOSIS — M6281 Muscle weakness (generalized): Secondary | ICD-10-CM | POA: Diagnosis not present

## 2018-09-15 DIAGNOSIS — Z7901 Long term (current) use of anticoagulants: Secondary | ICD-10-CM | POA: Diagnosis not present

## 2018-09-15 DIAGNOSIS — G932 Benign intracranial hypertension: Secondary | ICD-10-CM | POA: Diagnosis not present

## 2018-09-15 DIAGNOSIS — Z9049 Acquired absence of other specified parts of digestive tract: Secondary | ICD-10-CM | POA: Diagnosis not present

## 2018-09-15 DIAGNOSIS — K589 Irritable bowel syndrome without diarrhea: Secondary | ICD-10-CM | POA: Diagnosis not present

## 2018-09-15 DIAGNOSIS — Z955 Presence of coronary angioplasty implant and graft: Secondary | ICD-10-CM | POA: Diagnosis not present

## 2018-09-15 DIAGNOSIS — Z87891 Personal history of nicotine dependence: Secondary | ICD-10-CM | POA: Diagnosis not present

## 2018-09-15 DIAGNOSIS — Z7982 Long term (current) use of aspirin: Secondary | ICD-10-CM | POA: Diagnosis not present

## 2018-09-15 DIAGNOSIS — E039 Hypothyroidism, unspecified: Secondary | ICD-10-CM | POA: Diagnosis not present

## 2018-09-15 DIAGNOSIS — I252 Old myocardial infarction: Secondary | ICD-10-CM | POA: Diagnosis not present

## 2018-09-15 DIAGNOSIS — R2681 Unsteadiness on feet: Secondary | ICD-10-CM | POA: Diagnosis not present

## 2018-09-15 DIAGNOSIS — R569 Unspecified convulsions: Secondary | ICD-10-CM | POA: Diagnosis not present

## 2018-09-15 DIAGNOSIS — I482 Chronic atrial fibrillation, unspecified: Secondary | ICD-10-CM | POA: Diagnosis not present

## 2018-09-15 DIAGNOSIS — Z602 Problems related to living alone: Secondary | ICD-10-CM | POA: Diagnosis not present

## 2018-09-15 DIAGNOSIS — E7800 Pure hypercholesterolemia, unspecified: Secondary | ICD-10-CM | POA: Diagnosis not present

## 2018-09-15 DIAGNOSIS — K219 Gastro-esophageal reflux disease without esophagitis: Secondary | ICD-10-CM | POA: Diagnosis not present

## 2018-09-29 DIAGNOSIS — I10 Essential (primary) hypertension: Secondary | ICD-10-CM | POA: Diagnosis not present

## 2018-09-29 DIAGNOSIS — Z6820 Body mass index (BMI) 20.0-20.9, adult: Secondary | ICD-10-CM | POA: Diagnosis not present

## 2018-09-29 DIAGNOSIS — E782 Mixed hyperlipidemia: Secondary | ICD-10-CM | POA: Diagnosis not present

## 2018-09-29 DIAGNOSIS — K760 Fatty (change of) liver, not elsewhere classified: Secondary | ICD-10-CM | POA: Diagnosis not present

## 2018-09-29 DIAGNOSIS — E1140 Type 2 diabetes mellitus with diabetic neuropathy, unspecified: Secondary | ICD-10-CM | POA: Diagnosis not present

## 2018-10-01 DIAGNOSIS — I2510 Atherosclerotic heart disease of native coronary artery without angina pectoris: Secondary | ICD-10-CM | POA: Diagnosis not present

## 2018-10-01 DIAGNOSIS — G932 Benign intracranial hypertension: Secondary | ICD-10-CM | POA: Diagnosis not present

## 2018-10-01 DIAGNOSIS — I482 Chronic atrial fibrillation, unspecified: Secondary | ICD-10-CM | POA: Diagnosis not present

## 2018-10-01 DIAGNOSIS — Z87891 Personal history of nicotine dependence: Secondary | ICD-10-CM | POA: Diagnosis not present

## 2018-10-01 DIAGNOSIS — E1140 Type 2 diabetes mellitus with diabetic neuropathy, unspecified: Secondary | ICD-10-CM | POA: Diagnosis not present

## 2018-10-01 DIAGNOSIS — E7800 Pure hypercholesterolemia, unspecified: Secondary | ICD-10-CM | POA: Diagnosis not present

## 2018-10-01 DIAGNOSIS — I252 Old myocardial infarction: Secondary | ICD-10-CM | POA: Diagnosis not present

## 2018-10-01 DIAGNOSIS — Z602 Problems related to living alone: Secondary | ICD-10-CM | POA: Diagnosis not present

## 2018-10-01 DIAGNOSIS — Z7901 Long term (current) use of anticoagulants: Secondary | ICD-10-CM | POA: Diagnosis not present

## 2018-10-01 DIAGNOSIS — I249 Acute ischemic heart disease, unspecified: Secondary | ICD-10-CM | POA: Diagnosis not present

## 2018-10-01 DIAGNOSIS — Z7982 Long term (current) use of aspirin: Secondary | ICD-10-CM | POA: Diagnosis not present

## 2018-10-01 DIAGNOSIS — Z955 Presence of coronary angioplasty implant and graft: Secondary | ICD-10-CM | POA: Diagnosis not present

## 2018-10-01 DIAGNOSIS — K589 Irritable bowel syndrome without diarrhea: Secondary | ICD-10-CM | POA: Diagnosis not present

## 2018-10-01 DIAGNOSIS — E039 Hypothyroidism, unspecified: Secondary | ICD-10-CM | POA: Diagnosis not present

## 2018-10-01 DIAGNOSIS — Z9049 Acquired absence of other specified parts of digestive tract: Secondary | ICD-10-CM | POA: Diagnosis not present

## 2018-10-01 DIAGNOSIS — M6281 Muscle weakness (generalized): Secondary | ICD-10-CM | POA: Diagnosis not present

## 2018-10-01 DIAGNOSIS — I10 Essential (primary) hypertension: Secondary | ICD-10-CM | POA: Diagnosis not present

## 2018-10-01 DIAGNOSIS — R2681 Unsteadiness on feet: Secondary | ICD-10-CM | POA: Diagnosis not present

## 2018-10-01 DIAGNOSIS — R569 Unspecified convulsions: Secondary | ICD-10-CM | POA: Diagnosis not present

## 2018-10-01 DIAGNOSIS — Z8673 Personal history of transient ischemic attack (TIA), and cerebral infarction without residual deficits: Secondary | ICD-10-CM | POA: Diagnosis not present

## 2018-10-01 DIAGNOSIS — R2689 Other abnormalities of gait and mobility: Secondary | ICD-10-CM | POA: Diagnosis not present

## 2018-10-01 DIAGNOSIS — K219 Gastro-esophageal reflux disease without esophagitis: Secondary | ICD-10-CM | POA: Diagnosis not present

## 2018-10-01 DIAGNOSIS — Z9181 History of falling: Secondary | ICD-10-CM | POA: Diagnosis not present

## 2018-10-02 ENCOUNTER — Ambulatory Visit: Payer: Medicare Other | Admitting: Neurology

## 2018-10-08 DIAGNOSIS — M436 Torticollis: Secondary | ICD-10-CM | POA: Diagnosis not present

## 2018-10-08 DIAGNOSIS — E538 Deficiency of other specified B group vitamins: Secondary | ICD-10-CM | POA: Diagnosis not present

## 2018-10-08 DIAGNOSIS — E039 Hypothyroidism, unspecified: Secondary | ICD-10-CM | POA: Diagnosis not present

## 2018-10-08 DIAGNOSIS — R5383 Other fatigue: Secondary | ICD-10-CM | POA: Diagnosis not present

## 2018-10-08 DIAGNOSIS — J029 Acute pharyngitis, unspecified: Secondary | ICD-10-CM | POA: Diagnosis not present

## 2018-10-13 DIAGNOSIS — Z87891 Personal history of nicotine dependence: Secondary | ICD-10-CM | POA: Diagnosis not present

## 2018-10-13 DIAGNOSIS — I10 Essential (primary) hypertension: Secondary | ICD-10-CM | POA: Diagnosis not present

## 2018-10-13 DIAGNOSIS — E039 Hypothyroidism, unspecified: Secondary | ICD-10-CM | POA: Diagnosis not present

## 2018-10-13 DIAGNOSIS — Z602 Problems related to living alone: Secondary | ICD-10-CM | POA: Diagnosis not present

## 2018-10-13 DIAGNOSIS — I249 Acute ischemic heart disease, unspecified: Secondary | ICD-10-CM | POA: Diagnosis not present

## 2018-10-13 DIAGNOSIS — Z9049 Acquired absence of other specified parts of digestive tract: Secondary | ICD-10-CM | POA: Diagnosis not present

## 2018-10-13 DIAGNOSIS — I482 Chronic atrial fibrillation, unspecified: Secondary | ICD-10-CM | POA: Diagnosis not present

## 2018-10-13 DIAGNOSIS — Z9181 History of falling: Secondary | ICD-10-CM | POA: Diagnosis not present

## 2018-10-13 DIAGNOSIS — I2510 Atherosclerotic heart disease of native coronary artery without angina pectoris: Secondary | ICD-10-CM | POA: Diagnosis not present

## 2018-10-13 DIAGNOSIS — M6281 Muscle weakness (generalized): Secondary | ICD-10-CM | POA: Diagnosis not present

## 2018-10-13 DIAGNOSIS — R2681 Unsteadiness on feet: Secondary | ICD-10-CM | POA: Diagnosis not present

## 2018-10-13 DIAGNOSIS — Z955 Presence of coronary angioplasty implant and graft: Secondary | ICD-10-CM | POA: Diagnosis not present

## 2018-10-13 DIAGNOSIS — Z7901 Long term (current) use of anticoagulants: Secondary | ICD-10-CM | POA: Diagnosis not present

## 2018-10-13 DIAGNOSIS — E7800 Pure hypercholesterolemia, unspecified: Secondary | ICD-10-CM | POA: Diagnosis not present

## 2018-10-13 DIAGNOSIS — Z8673 Personal history of transient ischemic attack (TIA), and cerebral infarction without residual deficits: Secondary | ICD-10-CM | POA: Diagnosis not present

## 2018-10-13 DIAGNOSIS — R569 Unspecified convulsions: Secondary | ICD-10-CM | POA: Diagnosis not present

## 2018-10-13 DIAGNOSIS — I252 Old myocardial infarction: Secondary | ICD-10-CM | POA: Diagnosis not present

## 2018-10-13 DIAGNOSIS — Z7982 Long term (current) use of aspirin: Secondary | ICD-10-CM | POA: Diagnosis not present

## 2018-10-13 DIAGNOSIS — R2689 Other abnormalities of gait and mobility: Secondary | ICD-10-CM | POA: Diagnosis not present

## 2018-10-13 DIAGNOSIS — K219 Gastro-esophageal reflux disease without esophagitis: Secondary | ICD-10-CM | POA: Diagnosis not present

## 2018-10-13 DIAGNOSIS — E1140 Type 2 diabetes mellitus with diabetic neuropathy, unspecified: Secondary | ICD-10-CM | POA: Diagnosis not present

## 2018-10-13 DIAGNOSIS — K589 Irritable bowel syndrome without diarrhea: Secondary | ICD-10-CM | POA: Diagnosis not present

## 2018-10-13 DIAGNOSIS — G932 Benign intracranial hypertension: Secondary | ICD-10-CM | POA: Diagnosis not present

## 2018-10-14 ENCOUNTER — Encounter: Payer: Self-pay | Admitting: *Deleted

## 2018-10-14 DIAGNOSIS — E039 Hypothyroidism, unspecified: Secondary | ICD-10-CM | POA: Insufficient documentation

## 2018-10-14 DIAGNOSIS — J309 Allergic rhinitis, unspecified: Secondary | ICD-10-CM | POA: Insufficient documentation

## 2018-10-14 DIAGNOSIS — E559 Vitamin D deficiency, unspecified: Secondary | ICD-10-CM | POA: Insufficient documentation

## 2018-10-14 DIAGNOSIS — M79604 Pain in right leg: Secondary | ICD-10-CM | POA: Insufficient documentation

## 2018-10-14 DIAGNOSIS — M1990 Unspecified osteoarthritis, unspecified site: Secondary | ICD-10-CM | POA: Insufficient documentation

## 2018-10-14 DIAGNOSIS — R748 Abnormal levels of other serum enzymes: Secondary | ICD-10-CM | POA: Insufficient documentation

## 2018-10-14 DIAGNOSIS — L989 Disorder of the skin and subcutaneous tissue, unspecified: Secondary | ICD-10-CM | POA: Insufficient documentation

## 2018-10-14 DIAGNOSIS — I639 Cerebral infarction, unspecified: Secondary | ICD-10-CM | POA: Insufficient documentation

## 2018-10-14 DIAGNOSIS — I2510 Atherosclerotic heart disease of native coronary artery without angina pectoris: Secondary | ICD-10-CM | POA: Insufficient documentation

## 2018-10-14 DIAGNOSIS — E785 Hyperlipidemia, unspecified: Secondary | ICD-10-CM | POA: Insufficient documentation

## 2018-10-14 DIAGNOSIS — I739 Peripheral vascular disease, unspecified: Secondary | ICD-10-CM | POA: Insufficient documentation

## 2018-10-14 DIAGNOSIS — H269 Unspecified cataract: Secondary | ICD-10-CM | POA: Insufficient documentation

## 2018-10-14 DIAGNOSIS — J45909 Unspecified asthma, uncomplicated: Secondary | ICD-10-CM | POA: Insufficient documentation

## 2018-10-14 DIAGNOSIS — K760 Fatty (change of) liver, not elsewhere classified: Secondary | ICD-10-CM | POA: Insufficient documentation

## 2018-10-21 ENCOUNTER — Other Ambulatory Visit: Payer: Self-pay

## 2018-10-21 ENCOUNTER — Ambulatory Visit: Payer: Medicare Other | Admitting: Neurology

## 2018-10-21 ENCOUNTER — Telehealth: Payer: Self-pay | Admitting: Cardiology

## 2018-10-21 ENCOUNTER — Encounter: Payer: Self-pay | Admitting: Neurology

## 2018-10-21 VITALS — BP 198/77 | HR 80 | Ht 64.0 in | Wt 120.0 lb

## 2018-10-21 DIAGNOSIS — R413 Other amnesia: Secondary | ICD-10-CM | POA: Diagnosis not present

## 2018-10-21 DIAGNOSIS — R55 Syncope and collapse: Secondary | ICD-10-CM | POA: Diagnosis not present

## 2018-10-21 MED ORDER — LEVETIRACETAM 250 MG PO TABS: ORAL_TABLET | ORAL | 11 refills | Status: DC

## 2018-10-21 NOTE — Progress Notes (Addendum)
NEUROLOGY CONSULTATION NOTE  Elaine Mathews MRN: [id number] DOB: 1933-03-19  Referring provider: Dr. Lucianne Lei Primary care provider: Dr. Lucianne Lei  Reason for consult:  Syncopal seizure  Dear Dr Mathis Bud:  Thank you for your kind referral of Elaine Mathews for consultation of the above symptoms. Although her history is well known to you, please allow me to reiterate it for the purpose of our medical record. The patient was accompanied to the clinic by her son who also provides collateral information. Records and images were personally reviewed where available.  HISTORY OF PRESENT ILLNESS: This is an 85 year old right-handed woman with a history of paroxysmal atrial fibrillation on Xarelto, prior right parietal stroke, hypertension, presenting for evaluation of seizure that occurred last 07/12/2018 while she was at the hospital for syncope. She was at home when she had bilateral leg weakness and slid down a wall, she asked her grandson to call someone and was noted to have chest "discomfort" after the syncopal episodes with right shoulder pain and left pinky pain. Today she denies having any chest pain. She was noted to have elevated troponin levels and was transferred to [facility] for NSTEMI and cardiac cath, s/p DES to proximal/mid LAD on 6/1. Clopidogrel was added to Xarelto. On the evening of 6/1, she became acutely more confused and disoriented, then passed out with a sinus pause of around 5 seconds. She had a similar pause earlier in the day of around 6 seconds. Later on, she had another sinus pause with associated rigid extension of extremities, head turned to the left, teeth clenched, unresponsive for around 20 seconds with some tonic-clonic jerking. She was incontinent of stool and urine. There was note of transient left-sided weakness and left-sided vision deficit. Sinus pause felt due to vasovagal etiology due to worsening diarrhea and complaint of abdominal pain. Head CT did not show any  acute changes, there was an old right parietal infarct, chronic microvascular disease. EEG was obscured by artifact but in between appeared normal drowsy pattern. She was started on Levetiracetam 500mg  BID due to concern about focality with seizure semiology. She was discharged to rehab and has been home alone since June with her son coming in near daily to check on her. She has some drowsiness on the Levetiracetam and states her mood could be better, "I get irritable." Her son notes that she has been more irritable since hospital discharge, however both of them are arguing at each other several times during the visit with her son also noted to be in a bad mood. I spoke to her separately during the visit to address concerns.  She feels her memory is decent. She lives alone and denies any further syncopal episodes or seizures since 07/13/2018. Her grandson visits her, her son is very much against this and feels they are taking advantage of her. He reports "a couple of bad situations in this family." He has not seen any syncopal episodes since hospital discharge. She denies any olfactory/gustatory hallucinations, deja vu, rising epigastric sensation, focal numbness/tingling/weakness, myoclonic jerks. Sleep is good. She states she is good with taking her medications, her son later reports that she was initially not taking her medications then after hospitalization she has been better but would still take only 4 or 5 days a week of the pill pack. He started helping with her bills the past month because she would write out her check for the rent and write the wrong amount, switching numbers. She denies getting  lost driving, he has taken her car and only let her drive one time and "she almost took the fence at her home." No hallucinations or paranoia. Per PCP notes, she had nightmares on Donepezil. She states she thinks she is taking a memory medication but this is not on her medication list.  She denies any headaches,  diplopia, dysarthria/dysphagia, neck/back pain, bowel/bladder dysfunction, anosmia, tremors. She has been feeling lightheaded the past 2 days. She has some numbness in her fingertips. No family history of dementia. She had a normal birth and early development.  There is no history of febrile convulsions, CNS infections such as meningitis/encephalitis, significant traumatic brain injury, neurosurgical procedures, or family history of seizures.    PAST MEDICAL HISTORY: Past Medical History:  Diagnosis Date   Allergic rhinosinusitis    Arthritis    Ataxia    with generalized weakness leg   Atrial fibrillation (HCC)    CAD (coronary artery disease)    Cataract, bilateral    Diabetes (HCC)    Diabetic nephropathy (HCC)    Elevated liver enzymes    Essential hypertension    Fatty liver    Foot lesion    Full dentures    Hyperlipidemia    Hypothyroidism    Leg pain, bilateral    Memory impairment    Mild asthma    PVD (peripheral vascular disease) (HCC)    Seizure (HCC)    Stroke (HCC)    Vitamin D deficiency     PAST SURGICAL HISTORY: Past Surgical History:  Procedure Laterality Date   APPENDECTOMY     CATARACT EXTRACTION Bilateral    CHOLECYSTECTOMY     CORONARY STENT INTERVENTION N/A 07/13/2018   Procedure: CORONARY STENT INTERVENTION;  Surgeon: Yvonne KendallEnd, Christopher, MD;  Location: [facility]IVE [facility];  Service: Cardiovascular;  Laterality: N/A;   INTRAVASCULAR PRESSURE WIRE/FFR STUDY N/A 07/13/2018   Procedure: INTRAVASCULAR PRESSURE WIRE/FFR STUDY;  Surgeon: Yvonne KendallEnd, Christopher, MD;  Location: [facility] INVASIVE CV LAB;  Service: Cardiovascular;  Laterality: N/A;   LEFT HEART CATH AND CORONARY ANGIOGRAPHY N/A 07/13/2018   Procedure: LEFT HEART CATH AND CORONARY ANGIOGRAPHY;  Surgeon: Yvonne KendallEnd, Christopher, MD;  Location: [facility] INVASIVE CV LAB;  Service: Cardiovascular;  Laterality: N/A;   TONSILLECTOMY      MEDICATIONS: Current Outpatient Medications on File Prior to  Visit  Medication Sig Dispense Refill   atorvastatin (LIPITOR) 40 MG tablet Take 1 tablet (40 mg total) by mouth daily at 6 PM. 30 tablet 0   clopidogrel (PLAVIX) 75 MG tablet Take 1 tablet (75 mg total) by mouth daily with breakfast. 30 tablet 0   diltiazem (CARDIZEM CD) 180 MG 24 hr capsule Take 180 mg by mouth daily.     ergocalciferol (VITAMIN D2) 50000 units capsule Take 50,000 Units by mouth every Wednesday.      ezetimibe (ZETIA) 10 MG tablet Take 10 mg by mouth at bedtime.      Glucosamine-Chondroit-Vit C-Mn (GLUCOSAMINE 1500 COMPLEX PO) Take 1 capsule by mouth daily at 12 noon.      glucose blood test strip 1 each by Other route as needed. Use as instructed     levETIRAcetam (KEPPRA) 500 MG tablet Take 1 tablet (500 mg total) by mouth 2 (two) times daily. 60 tablet 0   levothyroxine (SYNTHROID, LEVOTHROID) 75 MCG tablet Take 75 mcg by mouth daily before breakfast.      lisinopril (PRINIVIL,ZESTRIL) 40 MG tablet Take 40 mg by mouth at bedtime.      montelukast (SINGULAIR)  10 MG tablet Take 10 mg by mouth at bedtime.     nitroGLYCERIN (NITROSTAT) 0.4 MG SL tablet Place 0.4 mg under the tongue every 5 (five) minutes as needed for chest pain.      Prenatal Vit-Fe Fumarate-FA (PRENATAL VITAMIN PO) Take 1 tablet by mouth daily at 12 noon.     Probiotic Product (PROBIOTIC PO) Take 1 tablet by mouth daily.     Rivaroxaban (XARELTO) 15 MG TABS tablet Take 1 tablet (15 mg total) by mouth daily with supper. 42 tablet 0   No current facility-administered medications on file prior to visit.     ALLERGIES: Allergies  Allergen Reactions   Codeine Other (See Comments)    Unknown reaction   Penicillins Other (See Comments)    Unknown reaction Did it involve swelling of the face/tongue/throat, SOB, or low BP? Unknown Did it involve sudden or severe rash/hives, skin peeling, or any reaction on the inside of your mouth or nose? Unknown Did you need to seek medical attention at a  hospital or doctor's office? Unknown When did it last happen?unknown (long time ago) If all above answers are NO, may proceed with cephalosporin use.    FAMILY HISTORY: Family History  Problem Relation Age of Onset   Hypertension Mother    Stroke Mother    Heart attack Mother    Hypercholesterolemia Mother    Hypertension Father    Stroke Father    Heart attack Son    COPD Son     SOCIAL HISTORY: Social History   Socioeconomic History   Marital status: Divorced    Spouse name: Not on file   Number of children: Not on file   Years of education: Not on file   Highest education level: Not on file  Occupational History   Not on file  Social Needs   Financial resource strain: Not on file   Food insecurity    Worry: Not on file    Inability: Not on file   Transportation needs    Medical: Not on file    Non-medical: Not on file  Tobacco Use   Smoking status: Former Smoker   Smokeless tobacco: Never Used  Substance and Sexual Activity   Alcohol use: No   Drug use: No   Sexual activity: Not Currently    Partners: Male  Lifestyle   Physical activity    Days per week: Not on file    Minutes per session: Not on file   Stress: Not on file  Relationships   Social connections    Talks on phone: Not on file    Gets together: Not on file    Attends religious service: Not on file    Active member of club or organization: Not on file    Attends meetings of clubs or organizations: Not on file    Relationship status: Not on file   Intimate partner violence    Fear of current or ex partner: Not on file    Emotionally abused: Not on file    Physically abused: Not on file    Forced sexual activity: Not on file  Other Topics Concern   Not on file  Social History Narrative   Lives with      Highest level of edu-      Right handed    REVIEW OF SYSTEMS: Constitutional: No fevers, chills, or sweats, no generalized fatigue, change in  appetite Eyes: No visual changes, double vision, eye pain Ear, nose and  throat: No hearing loss, ear pain, nasal congestion, sore throat Cardiovascular: No chest pain, palpitations Respiratory:  No shortness of breath at rest or with exertion, wheezes GastrointestinaI: No nausea, vomiting, diarrhea, abdominal pain, fecal incontinence Genitourinary:  No dysuria, urinary retention or frequency Musculoskeletal:  No neck pain, back pain Integumentary: No rash, pruritus, skin lesions Neurological: as above Psychiatric: No depression, insomnia, anxiety Endocrine: No palpitations, fatigue, diaphoresis, mood swings, change in appetite, change in weight, increased thirst Hematologic/Lymphatic:  No anemia, purpura, petechiae. Allergic/Immunologic: no itchy/runny eyes, nasal congestion, recent allergic reactions, rashes  PHYSICAL EXAM: Vitals:   10/21/18 1305  BP: (!) 198/77  Pulse: 80   General: No acute distress Head:  Normocephalic/atraumatic Skin/Extremities: No rash, no edema Neurological Exam: Mental status: alert and oriented to person, place, and time, no dysarthria or aphasia, Fund of knowledge is appropriate.  Recent and remote memory are impaired.  Attention and concentration are normal.    Able to name objects and repeat phrases. More difficulty with visuospatial/executive functioning Montreal Cognitive Assessment  10/21/2018  Visuospatial/ Executive (0/5) 1  Naming (0/3) 3  Attention: Read list of digits (0/2) 2  Attention: Read list of letters (0/1) 1  Attention: Serial 7 subtraction starting at 100 (0/3) 1  Language: Repeat phrase (0/2) 1  Language : Fluency (0/1) 0  Abstraction (0/2) 2  Delayed Recall (0/5) 0  Orientation (0/6) 5  Total 16  Adjusted Score (based on education) 17   Cranial nerves: CN I: not tested CN II: pupils equal, round and reactive to light, visual fields intact CN III, IV, VI:  full range of motion, no nystagmus, no ptosis CN V: facial sensation  intact CN VII: upper and lower face symmetric CN VIII: hearing intact to conversation CN IX, X: gag intact, uvula midline CN XI: sternocleidomastoid and trapezius muscles intact CN XII: tongue midline Bulk & Tone: normal, no fasciculations. Motor: 5/5 throughout with no pronator drift. Sensation: intact to light touch, cold, pin, vibration and joint position sense.  No extinction to double simultaneous stimulation.  Romberg test slight sway Deep Tendon Reflexes: +2 throughout, no ankle clonus Plantar responses: downgoing bilaterally Cerebellar: no incoordination on finger to nose testing Gait: slow and cautious reporting legs feel weak, no ataxia Tremor: none  IMPRESSION: This is an 85 year old right-handed woman with a history of  paroxysmal atrial fibrillation on Xarelto, prior right parietal stroke, hypertension, presenting for evaluation of seizure that occurred last 07/12/2018 while she was at the hospital for syncope. She had several prolonged sinus pauses, and with one of them she had a witnessed GTC with left head turn and reported transient left-sided weakness. Syncope/pauses felt due to vasovagal etiology from pain/diarrhea. Syncopal seizure was considered, however with focal symptoms (and possible etiology of seizure from right parietal stroke), she was discharged home on Levetiracetam 500mg  BID. No further seizures since 07/13/2018, however she appears to be having side effects on current dose. Reduce to 250mg  in AM, 500mg  in PM. There is tension between her and her son, it is unclear if this is due to ongoing family issues or frustration with her memory/behavioral changes due to likely dementia. Her MOCA score today is 17/30, she continues to live alone but her son has been starting to help with medications and finances. Neurocognitive testing will be ordered to further evaluate memory concerns and help with long-term planning.   [place] driving laws were discussed with the patient, and she  knows to stop driving after a seizure, until  6 months seizure-free. BP today elevated 198/77, she and her son had been arguing a lot, she was instructed to monitor BP and follow-up with PCP. She will follow-up in 6 months and knows to call for any changes.   Thank you for allowing me to participate in the care of this patient. Please do not hesitate to call for any questions or concerns.   Ellouise Newer, M.D.  CC: Dr. Rica Records

## 2018-10-21 NOTE — Patient Instructions (Signed)
1. Reduce Keppra (Levetiracetam) dose to 250mg  in AM, 500mg  in PM  2. Schedule Neurocognitive testing with Dr. Melvyn Novas  3. As per [place] driving laws, no driving until 6 months seizure-free. Driving also will depend on results of memory testing  4. Follow-up in 6 months, call for any changes

## 2018-10-21 NOTE — Telephone Encounter (Signed)
Patients son called and states that she was supposed to wear a monitor and has not received one yet.

## 2018-10-22 ENCOUNTER — Ambulatory Visit (INDEPENDENT_AMBULATORY_CARE_PROVIDER_SITE_OTHER): Payer: Medicare Other

## 2018-10-22 DIAGNOSIS — I480 Paroxysmal atrial fibrillation: Secondary | ICD-10-CM | POA: Diagnosis not present

## 2018-10-22 NOTE — Telephone Encounter (Signed)
According to [employer] tracking, device was delivered on 10/20/18 at 2:42 pm. RN informed Edd Arbour, patients son. He will check mail and verify receipt. No further questions at this time.

## 2018-10-23 ENCOUNTER — Telehealth: Payer: Self-pay | Admitting: Cardiology

## 2018-10-23 NOTE — Telephone Encounter (Signed)
error 

## 2018-10-26 ENCOUNTER — Encounter: Payer: Self-pay | Admitting: Cardiology

## 2018-10-26 ENCOUNTER — Ambulatory Visit (INDEPENDENT_AMBULATORY_CARE_PROVIDER_SITE_OTHER): Payer: Medicare Other | Admitting: Cardiology

## 2018-10-26 ENCOUNTER — Other Ambulatory Visit: Payer: Self-pay

## 2018-10-26 VITALS — BP 160/70 | HR 65 | Ht 64.0 in | Wt 122.0 lb

## 2018-10-26 DIAGNOSIS — I2510 Atherosclerotic heart disease of native coronary artery without angina pectoris: Secondary | ICD-10-CM

## 2018-10-26 DIAGNOSIS — I480 Paroxysmal atrial fibrillation: Secondary | ICD-10-CM

## 2018-10-26 DIAGNOSIS — E1140 Type 2 diabetes mellitus with diabetic neuropathy, unspecified: Secondary | ICD-10-CM

## 2018-10-26 DIAGNOSIS — I10 Essential (primary) hypertension: Secondary | ICD-10-CM | POA: Diagnosis not present

## 2018-10-26 NOTE — Patient Instructions (Signed)
Medication Instructions:  Your physician recommends that you continue on your current medications as directed. Please refer to the Current Medication list given to you today.  If you need a refill on your cardiac medications before your next appointment, please call your pharmacy.   Lab work: NONE If you have labs (blood work) drawn today and your tests are completely normal, you will receive your results only by: . MyChart Message (if you have MyChart) OR . A paper copy in the mail If you have any lab test that is abnormal or we need to change your treatment, we will call you to review the results.  Testing/Procedures: NONE  Follow-Up: At [facility], you and your health needs are our priority.  As part of our continuing mission to provide you with exceptional heart care, we have created designated Provider Care Teams.  These Care Teams include your primary Cardiologist (physician) and Advanced Practice Providers (APPs -  Physician Assistants and Nurse Practitioners) who all work together to provide you with the care you need, when you need it.    You will need a follow up appointment in 6 weeks.    

## 2018-10-26 NOTE — Progress Notes (Signed)
Cardiology Office Note:    Date:  10/26/2018   ID:  Elaine Mathews, DOB 03-02-1933, MRN [id number]  PCP:  Lucianne LeiUppin, Nina, MD  Cardiologist:  Garwin Brothersajan R , MD   Referring MD: Lucianne LeiUppin, Nina, MD    ASSESSMENT:    1. Paroxysmal atrial fibrillation (HCC)   2. Essential hypertension   3. Coronary artery disease involving native coronary artery of native heart without angina pectoris   4. Controlled type 2 diabetes with neuropathy (HCC)    PLAN:    In order of problems listed above:  1. Spells of lightheadedness: I discussed the findings with my patient at extensive length.  She is undergoing ZIO monitoring.  Her thyroid is a little abnormal and managed by her primary care physician.  I will wait for the results of these tests.  We will make recommendations based on the findings of the monitoring.  If this is unrevealing and symptoms recur then we will consider patient to be referred for a 1 month event monitoring or a loop recorder.  I discussed this with the patient and her son and he vocalized understanding. 2. Coronary artery disease: Secondary prevention stressed with the patient.  Importance of compliance with diet and medication stressed and she vocalized understanding. 3. Essential hypertension: Blood pressure is stable.  She has an element of whitecoat hypertension. 4. Mixed dyslipidemia: Lipids followed by primary care physician.  She understands diet well. 5. Patient will be seen in follow-up appointment in 2 months or earlier if the patient has any concerns    Medication Adjustments/Labs and Tests Ordered: Current medicines are reviewed at length with the patient today.  Concerns regarding medicines are outlined above.  No orders of the defined types were placed in this encounter.  No orders of the defined types were placed in this encounter.    Chief Complaint  Patient presents with  . Follow-up     History of Present Illness:    Elaine HibbsCarol D Aylward is a 85 y.o. female.   Patient has past medical history approximately fibrillation, essential hypertension, dyslipidemia and coronary artery disease.  Overall she is doing well and her son is very supportive and accompanies her for this visit.  She has been having some lightheaded spells which is why she was referred her by primary care physician.  No chest pain orthopnea or PND.  No syncopal spells.  At the time of my evaluation, the patient is alert awake oriented and in no distress.  At the time of my evaluation, the patient is alert awake oriented and in no distress.  At the time of my evaluation, the patient is alert awake oriented and in no distress.   Past Medical History:  Diagnosis Date  . Allergic rhinosinusitis   . Arthritis   . Ataxia    with generalized weakness leg  . Atrial fibrillation (HCC)   . CAD (coronary artery disease)   . Cataract, bilateral   . Diabetes (HCC)   . Diabetic nephropathy (HCC)   . Elevated liver enzymes   . Essential hypertension   . Fatty liver   . Foot lesion   . Full dentures   . Hyperlipidemia   . Hypothyroidism   . Leg pain, bilateral   . Memory impairment   . Mild asthma   . PVD (peripheral vascular disease) (HCC)   . Seizure (HCC)   . Stroke (HCC)   . Vitamin D deficiency     Past Surgical History:  Procedure Laterality Date  .  APPENDECTOMY    . CATARACT EXTRACTION Bilateral   . CHOLECYSTECTOMY    . CORONARY STENT INTERVENTION N/A 07/13/2018   Procedure: CORONARY STENT INTERVENTION;  Surgeon: Nelva Bush, MD;  Location: [facility];  Service: Cardiovascular;  Laterality: N/A;  . INTRAVASCULAR PRESSURE WIRE/FFR STUDY N/A 07/13/2018   Procedure: INTRAVASCULAR PRESSURE WIRE/FFR STUDY;  Surgeon: Nelva Bush, MD;  Location: [facility];  Service: Cardiovascular;  Laterality: N/A;  . LEFT HEART CATH AND CORONARY ANGIOGRAPHY N/A 07/13/2018   Procedure: LEFT HEART CATH AND CORONARY ANGIOGRAPHY;  Surgeon: Nelva Bush, MD;  Location: [facility];  Service: Cardiovascular;  Laterality: N/A;  . TONSILLECTOMY      Current Medications: Current Meds  Medication Sig  . atorvastatin (LIPITOR) 40 MG tablet Take 1 tablet (40 mg total) by mouth daily at 6 PM.  . clopidogrel (PLAVIX) 75 MG tablet Take 1 tablet (75 mg total) by mouth daily with breakfast.  . colesevelam (WELCHOL) 625 MG tablet Take by mouth 2 (two) times daily with a meal.  . diltiazem (CARDIZEM CD) 180 MG 24 hr capsule Take 180 mg by mouth daily.  . ergocalciferol (VITAMIN D2) 50000 units capsule Take 50,000 Units by mouth every Wednesday.   . ezetimibe (ZETIA) 10 MG tablet Take 10 mg by mouth at bedtime.   . Glucosamine-Chondroit-Vit C-Mn (GLUCOSAMINE 1500 COMPLEX PO) Take 1 capsule by mouth daily at 12 noon.   Marland Kitchen glucose blood test strip 1 each by Other route as needed. Use as instructed  . levETIRAcetam (KEPPRA) 250 MG tablet Take 1 tab in AM, 2 tabs in PM  . levothyroxine (SYNTHROID, LEVOTHROID) 75 MCG tablet Take 75 mcg by mouth daily before breakfast.   . lisinopril (PRINIVIL,ZESTRIL) 40 MG tablet Take 40 mg by mouth at bedtime.   . memantine (NAMENDA) 10 MG tablet 10 mg daily.  . montelukast (SINGULAIR) 10 MG tablet Take 10 mg by mouth at bedtime.  . nitroGLYCERIN (NITROSTAT) 0.4 MG SL tablet Place 0.4 mg under the tongue every 5 (five) minutes as needed for chest pain.   . Prenatal Vit-Fe Fumarate-FA (PRENATAL VITAMIN PO) Take 1 tablet by mouth daily at 12 noon.  . Probiotic Product (PROBIOTIC PO) Take 1 tablet by mouth daily.  . Rivaroxaban (XARELTO) 15 MG TABS tablet Take 1 tablet (15 mg total) by mouth daily with supper.     Allergies:   Codeine and Penicillins   Social History   Socioeconomic History  . Marital status: Divorced    Spouse name: Not on file  . Number of children: Not on file  . Years of education: Not on file  . Highest education level: Not on file  Occupational History  . Not on file  Social Needs  . Financial resource  strain: Not on file  . Food insecurity    Worry: Not on file    Inability: Not on file  . Transportation needs    Medical: Not on file    Non-medical: Not on file  Tobacco Use  . Smoking status: Former Research scientist (life sciences)  . Smokeless tobacco: Never Used  Substance and Sexual Activity  . Alcohol use: No  . Drug use: No  . Sexual activity: Not Currently    Partners: Male  Lifestyle  . Physical activity    Days per week: Not on file    Minutes per session: Not on file  . Stress: Not on file  Relationships  . Social Herbalist on  phone: Not on file    Gets together: Not on file    Attends religious service: Not on file    Active member of club or organization: Not on file    Attends meetings of clubs or organizations: Not on file    Relationship status: Not on file  Other Topics Concern  . Not on file  Social History Narrative   Lives with      Highest level of edu-      Right handed     Family History: The patient's family history includes COPD in her son; Heart attack in her mother and son; Hypercholesterolemia in her mother; Hypertension in her father and mother; Stroke in her father and mother.  ROS:   Please see the history of present illness.    All other systems reviewed and are negative.  EKGs/Labs/Other Studies Reviewed:    The following studies were reviewed today: It is undergoing ZIO monitoring for 14 days at this time.   Recent Labs: 07/13/2018: ALT 38; TSH 3.049 07/14/2018: Magnesium 2.5 07/16/2018: BUN 10; Creatinine, Ser 0.74; Potassium 4.2; Sodium 133 07/17/2018: Hemoglobin 9.9; Platelets 151  Recent Lipid Panel    Component Value Date/Time   CHOL 197 07/13/2018 0234   TRIG 116 07/13/2018 0234   HDL 53 07/13/2018 0234   CHOLHDL 3.7 07/13/2018 0234   VLDL 23 07/13/2018 0234   LDLCALC 121 (H) 07/13/2018 0234    Physical Exam:    VS:  BP (!) 160/70 (BP Location: Left Arm, Patient Position: Sitting, Cuff Size: Normal)   Pulse 65   Ht 5\' 4"  (1.626  m)   Wt 122 lb (55.3 kg)   SpO2 96%   BMI 20.94 kg/m     Wt Readings from Last 3 Encounters:  10/26/18 122 lb (55.3 kg)  10/21/18 120 lb (54.4 kg)  07/18/18 126 lb 4.8 oz (57.3 kg)     GEN: Patient is in no acute distress HEENT: Normal NECK: No JVD; No carotid bruits LYMPHATICS: No lymphadenopathy CARDIAC: Hear sounds regular, 2/6 systolic murmur at the apex. RESPIRATORY:  Clear to auscultation without rales, wheezing or rhonchi  ABDOMEN: Soft, non-tender, non-distended MUSCULOSKELETAL:  No edema; No deformity  SKIN: Warm and dry NEUROLOGIC:  Alert and oriented x 3 PSYCHIATRIC:  Normal affect   Signed, Garwin Brothers, MD  10/26/2018 4:35 PM    [facility]

## 2018-10-28 ENCOUNTER — Telehealth: Payer: Self-pay | Admitting: Neurology

## 2018-10-28 NOTE — Telephone Encounter (Signed)
Son said that [facility] was wanting to speak with someone in regards to the Pill Pack. He was trying to get it filled for next month and they are needing more info. Pharm Phone #: [phone]. Thanks!

## 2018-10-29 NOTE — Telephone Encounter (Signed)
Pharmacist informed at [facility] that [place] has been changed to 250mg  am and 500mg  pm. Pharmacist filled #[phone] with 3 refills.

## 2018-11-09 ENCOUNTER — Telehealth: Payer: Self-pay | Admitting: Neurology

## 2018-11-09 NOTE — Telephone Encounter (Signed)
Son was calling in about patient wanting to drive and giving him a hard time.  He took her car away because she was instructed not to drive for 6 months until evaluated again. Please call him back. He is needing some advice. Patient is also scheduled for testing with Melvyn Novas on Thursday this week. Thanks!

## 2018-11-11 NOTE — Telephone Encounter (Signed)
1. Reduce Keppra (Levetiracetam) dose to 250mg  in AM, 500mg  in PM  2. Schedule Neurocognitive testing with Dr. Melvyn Novas  3. As per [place] driving laws, no driving until 6 months seizure-free. Driving also will depend on results of memory testing  4. Follow-up in 6 months, call for any changes   Spoke with patient Son she was informed of this he states that he has lock her car up in the garage. She has appt scheduled tomorrow she will be here with son.

## 2018-11-12 ENCOUNTER — Other Ambulatory Visit: Payer: Self-pay

## 2018-11-12 ENCOUNTER — Ambulatory Visit (INDEPENDENT_AMBULATORY_CARE_PROVIDER_SITE_OTHER): Payer: Medicare Other | Admitting: Psychology

## 2018-11-12 ENCOUNTER — Encounter: Payer: Self-pay | Admitting: Psychology

## 2018-11-12 ENCOUNTER — Ambulatory Visit: Payer: Medicare Other

## 2018-11-12 DIAGNOSIS — F0670 Mild neurocognitive disorder due to known physiological condition without behavioral disturbance: Secondary | ICD-10-CM

## 2018-11-12 DIAGNOSIS — F0150 Vascular dementia without behavioral disturbance: Secondary | ICD-10-CM | POA: Diagnosis not present

## 2018-11-12 DIAGNOSIS — F01A0 Vascular dementia, mild, without behavioral disturbance, psychotic disturbance, mood disturbance, and anxiety: Secondary | ICD-10-CM

## 2018-11-12 DIAGNOSIS — R55 Syncope and collapse: Secondary | ICD-10-CM

## 2018-11-12 NOTE — Progress Notes (Signed)
NEUROPSYCHOLOGICAL EVALUATION [place]. [facility] [place] Department of Neurology  Reason for Referral:   Elaine Mathews is a 85 y.o. Caucasian female referred by Patrcia DollyKaren Aquino, M.D., to characterize her current cognitive functioning and assist with diagnostic clarity and treatment planning in the context of recent seizure onset, history of right parietal stroke, and several medical comorbidities.  Assessment and Plan:   Clinical Impression(s): Overall, Elaine Mathews's pattern of performance is suggestive of primary areas of weakness surrounding visuospatial and executive functioning. Additional performance variability was seen across processing speed and retrieval aspects of memory. Regarding the former, consistent patterns of weakness across visuospatial functioning were exhibited. This included a weakness in visual memory relative to verbal memory as raw scores across the former were very low (learning and later recollection of 1/5 shapes) despite normative performance being in the average range. Consistent weakness in this domain is very consistent with her previous history of a right parietal stroke, and this likely represents the primary etiology of these deficits.  Regarding deficits in executive functioning and performance variability across other domains, performance is largely consistent with what can be seen in individuals with chronic small vessel disease and a history of various cardiovascular ailments, including prior heart attack. 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   Recommendations:  Repeat neuropsychological evaluation in 12-18 months (or sooner if functional decline is noted) would be beneficial in assessing the trajectory of future cognitive decline should it occur.  Elaine Mathews is not currently driving due to seizure-related restrictions. However, I do have concerns  regarding driving once these are lifted given deficits in processing speed, visuospatial abilities, and executive functioning (especially cognitive flexibility), coupled with her report of residual visual acuity difficulties. Prior to resuming driving activities, it is recommended that she be referred to an optometrist to correct visual acuity difficulties as best as possible.  Additionally, it is also recommended that Elaine Mathews complete an occupational therapy driving evaluation to ensure her continued safety behind the wheel.   Optimal control of vascular risk factors (including safe cardiovascular exercise and adherence to dietary recommendations) is encouraged.  Continued participation in activities which provide mental stimulation and social interaction is also recommended.   To address problems with processing speed, she may wish to consider:   -Scheduling more difficult activities for a time of day when she is usually most alert   -Ensuring that she is alerted when essential material or instructions are being presented   -Adjusting the speed at which new information is presented   -Allowing additional processing time or a chance to rehearse novel information   -Allowing for more time in comprehending, processing, and responding in conversation  Review of Records:   Elaine Mathews was seen by [facility] Kitchen(Patrcia DollyKaren Aquino, M.D.) on 10/21/2018 for the evaluation of a seizure which occurred on 07/12/2018 while she was already at the hospital due to syncope. Briefly, on 5/31, Elaine Mathews was at home when she had bilateral leg weakness and slid down a wall. She also noted chest "discomfort" with right shoulder and left pinky pain as well. [facility] was [facility] for NSTEMI and cardiac cath on 6/1. Later that evening, Elaine Mathews became more confused and disoriented and passed out with a sinus pause of about 5 seconds. Later on, she had another sinus pause with associated rigid extension of  extremities, head turned to the left, teeth clenched,  and was unresponsive for around 20 seconds with some tonic-clonic jerking. She was incontinent of stool and urine. There was note of transient left-sided weakness and left-sided vision deficit. Head CT at that time did not show any acute changes. EEG was obscured by artifact. She was started on Levetiracetam 500mg  BID due to concern about focality with seizure semiology. She was discharged to rehab and has been home alone since June with her son coming in near daily to check on her. Further syncopal episodes or seizure activity was denied. Regarding cognition, Elaine Mathews denied short-term memory concerns. Performance on a brief cognitive screening instrument [facility]) was in the abnormal range (17/30). Points were lost across the following domains: visuospatial/executive (1/5), serial 7's (1/3), sentence repetition (1/2), verbal fluency (0/1), delayed recall (0/5), and orientation (5/6). She was awarded one additional point due to her educational background. Subsequently, she as referred for a comprehensive neuropsychological evaluation to characterize her cognitive abilities and to assist with diagnostic clarity and treatment planning.   Head CT on 07/14/2018 revealed chronic small vessel ischemia and a remote right parietal infarct.   Past Medical History:  Diagnosis Date  . Allergic rhinosinusitis   . Arthritis   . Ataxia    With generalized weakness in legs  . CAD (coronary artery disease)   . Cataract, bilateral   . Controlled type 2 diabetes with neuropathy (HCC) 01/06/2017  . Elevated liver enzymes   . Essential hypertension 11/13/2017  . Fatty liver   . Foot lesion   . Hyperlipidemia   . Hypothyroidism   . Mild asthma   . NSTEMI (non-ST elevated myocardial infarction) (HCC) 07/12/2018  . Paroxysmal atrial fibrillation (HCC) 11/13/2017  . PVD (peripheral vascular disease) (HCC)   . Seizure (HCC)   . Spleen hematoma without rupture of  capsule, without open wound into cavity 07/15/2018  . Stroke [facility](HCC)    Right parietal  . Vitamin D deficiency     Past Surgical History:  Procedure Laterality Date  . APPENDECTOMY    . CATARACT EXTRACTION Bilateral   . CHOLECYSTECTOMY    . CORONARY STENT INTERVENTION N/A 07/13/2018   Procedure: CORONARY STENT INTERVENTION;  Surgeon: Yvonne KendallEnd, Christopher, MD;  Location: [facility] INVASIVE CV LAB;  Service: Cardiovascular;  Laterality: N/A;  . INTRAVASCULAR PRESSURE WIRE/FFR STUDY N/A 07/13/2018   Procedure: INTRAVASCULAR PRESSURE WIRE/FFR STUDY;  Surgeon: Yvonne KendallEnd, Christopher, MD;  Location: [facility] INVASIVE CV LAB;  Service: Cardiovascular;  Laterality: N/A;  . LEFT HEART CATH AND CORONARY ANGIOGRAPHY N/A 07/13/2018   Procedure: LEFT HEART CATH AND CORONARY ANGIOGRAPHY;  Surgeon: Yvonne KendallEnd, Christopher, MD;  Location: [facility] INVASIVE CV LAB;  Service: Cardiovascular;  Laterality: N/A;  . TONSILLECTOMY      Family History  Problem Relation Age of Onset  . Hypertension Mother   . Stroke Mother   . Heart attack Mother   . Hypercholesterolemia Mother   . Hypertension Father   . Stroke Father   . Heart attack Son   . COPD Son      Current Outpatient Medications:  .  atorvastatin (LIPITOR) 40 MG tablet, Take 1 tablet (40 mg total) by mouth daily at 6 PM., Disp: 30 tablet, Rfl: 0 .  clopidogrel (PLAVIX) 75 MG tablet, Take 1 tablet (75 mg total) by mouth daily with breakfast., Disp: 30 tablet, Rfl: 0 .  colestipol (COLESTID) 1 g tablet, Take 1 g by mouth daily., Disp: , Rfl:  .  diltiazem (CARDIZEM CD) 180 MG 24 hr capsule, Take 180 mg by mouth  daily., Disp: , Rfl:  .  ergocalciferol (VITAMIN D2) 50000 units capsule, Take 50,000 Units by mouth every Wednesday. , Disp: , Rfl:  .  ezetimibe (ZETIA) 10 MG tablet, Take 10 mg by mouth at bedtime. , Disp: , Rfl:  .  famotidine (PEPCID) 40 MG tablet, Take 40 mg by mouth daily., Disp: , Rfl:  .  fluticasone (FLONASE) 50 MCG/ACT nasal spray, Place 2 sprays into both nostrils daily.,  Disp: , Rfl:  .  furosemide (LASIX) 20 MG tablet, Take 20 mg by mouth 2 (two) times daily. Take 2 at 8AM and 1-2 at at [facility] if needed, Disp: , Rfl:  .  Glucosamine-Chondroit-Vit C-Mn (GLUCOSAMINE 1500 COMPLEX PO), Take 1 capsule by mouth daily at 12 noon. , Disp: , Rfl:  .  glucose blood test strip, 1 each by Other route as needed. Use as instructed, Disp: , Rfl:  .  levETIRAcetam (KEPPRA) 500 MG tablet, Take 500 mg by mouth 2 (two) times daily., Disp: , Rfl:  .  levothyroxine (SYNTHROID, LEVOTHROID) 75 MCG tablet, Take 75 mcg by mouth daily before breakfast. , Disp: , Rfl:  .  memantine (NAMENDA) 10 MG tablet, 10 mg daily., Disp: , Rfl:  .  montelukast (SINGULAIR) 10 MG tablet, Take 10 mg by mouth at bedtime., Disp: , Rfl:  .  nitroGLYCERIN (NITROSTAT) 0.4 MG SL tablet, Place 0.4 mg under the tongue every 5 (five) minutes as needed for chest pain. , Disp: , Rfl:  .  Prenatal Vit-Fe Fumarate-FA (PRENATAL VITAMIN PO), Take 1 tablet by mouth daily at 12 noon., Disp: , Rfl:  .  Probiotic Product (PROBIOTIC PO), Take 1 tablet by mouth daily., Disp: , Rfl:  .  Rivaroxaban (XARELTO) 15 MG TABS tablet, Take 1 tablet (15 mg total) by mouth daily with supper., Disp: 42 tablet, Rfl: 0  Clinical Interview:   Cognitive Symptoms: Decreased short-term memory: Denied. Decreased long-term memory: Denied. Decreased attention/concentration: Denied. Increased ease of distractibility: Denied. Reduced processing speed: Denied. Difficulties with executive functions: Largely denied. Elaine Mathews described mild difficulties with disorganization, as well as longstanding patterns of indecisiveness. Trouble with impulsivity or poor judgment were denied. Difficulties with emotion regulation: Denied. Difficulties with receptive language: Denied. Difficulties with word finding: Endorsed. There were described as mild in nature and have exhibited a generally stable pattern over the years. Decreased visuoperceptual ability:  Denied.  Difficulties completing ADLs: Denied. Elaine Mathews is not currently driving due to seizure restrictions. However, she was driving prior to seizure activity without issue.   Additional Medical History: History of traumatic brain injury/concussion: Denied. She did note one instance in which she fell and hit her head. However, she denied a loss in consciousness, as well as any posttraumatic or retrograde amnesia.  History of stroke: Endorsed. Elaine Mathews reported a history of two strokes (however, medical records only suggest history of a remote right parietal stroke occurring at an unspecified time. This stroke event is believed to have been asymptomatic and Elaine Mathews did not note any residual deficits or known periods of increased cognitive or functional dysfunction.  History of seizure activity: Endorsed. Please see previous sections regarding seizure activity on 07/13/2018. No subsequent seizure activity was reported and Elaine Mathews reported feeling back to her pre-seizure baseline currently.  History of known exposure to toxins: Denied. Symptoms of chronic pain: Endorsed. She reported bilateral leg pain, generally treated with IcyHot patches with positive effect.  Experience of frequent headaches/migraines: Denied. Frequent instances of dizziness/vertigo: Denied. While she  acknowledged instances of dizziness and vertigo in the past, these were not said to occur with any regularity.   Sensory changes: Endorsed. She reported a history of bilateral cataract surgery, as well as some residual visual acuity difficulties. She reported using readers with positive effect, but expressed the need to visit her eye doctor in the future for an updated prescription. No other sensory changes/difficulties (i.e., hearing, taste, or smell) were endorsed. Balance/coordination difficulties: Endorsed. Balance instability was attributed to weakness and pain occurring in her legs bilaterally. She noted feeling less steady  on her feet and often uses the wall or other external supports to assist her in ambulating safely. She did acknowledge a history of several falls; however, these were described as remote in nature.  Other motor difficulties: Denied.  Sleep History: Estimated hours obtained each night: 8 hours. Difficulties falling asleep: Denied. Difficulties staying asleep: Denied. Feels rested and refreshed upon awakening: Endorsed.  History of snoring: Unclear History of waking up gasping for air: Endorsed. However, instances were said to be rare and sporadic in nature.  Witnessed breath cessation while asleep: Denied.  History of vivid dreaming: Denied. Excessive movement while asleep: Denied. Instances of acting out her dreams: Denied.  Psychiatric/Behavioral Health History: Depression: Denied. Ms. Hulon described her current mood as "ok" and denied current or previous mental health concerns. Current or remote suicidal ideation, intent, or plan was denied.  Anxiety: Denied. Mania: Denied. Trauma History: Denied. Visual/auditory hallucinations: Denied. Delusional thoughts: Denied. Mental health treatment: Denied.  Tobacco: Denied. Alcohol: Elaine Mathews denied current alcohol consumption, as well as a history of problematic alcohol use, abuse, or dependence.  Recreational drugs: Denied. Caffeine: Endorsed. She reported consuming one cup of coffee each morning, and occasional Dr. Reino Kent caffeinated sodas throughout the week.  Academic/Vocational History: Highest level of educational attainment: 10 years. She described herself as an average student in academic settings. English was described as a possible relative weakness among subjects.   History of developmental delay: Denied. History of grade repetition: Denied. History of class failures: Denied. Enrollment in special education courses: Denied. History of diagnosed specific learning disability: Denied. History of ADHD: Denied.  Employment:  Retired. 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  Evaluation Results:   Behavioral Observations: Elaine Mathews was accompanied by her son, arrived to her appointment on time, and was appropriately dressed and groomed. Observed gait and station were within normal limits. Gross motor functioning appeared intact upon informal observation and no abnormal movements (e.g., tremors) were noted. Her affect was generally relaxed and positive, but did range appropriately given the subject being discussed during the clinical interview or the task at hand during testing procedures. Spontaneous speech was fluent and word finding difficulties were not observed during the clinical interview or testing procedures. Sustained attention was appropriate throughout. Thought processes were coherent, organized, and normal in content. Task engagement was adequate and she persisted well when challenged. Overall, Elaine Mathews was cooperative with the clinical interview and subsequent testing procedures.   Adequacy of Effort: The validity of neuropsychological testing is limited by the extent to which the individual being tested may be assumed to have exerted adequate effort during testing. Elaine Mathews expressed her intention to perform to the best of her abilities and exhibited adequate task engagement and persistence. Scores across stand-alone and embedded performance validity measures were within expectation. As such, the results of the current evaluation are believed to be a valid representation of Elaine Mathews current cognitive functioning.  Test Results: Elaine Mathews was  fully oriented at the time of the current evaluation.  Intellectual abilities based upon educational and vocational attainment were estimated to be in the average range. Premorbid abilities were estimated to be within the below average range based upon a single-word reading test.   Processing speed was well below average to average. Basic attention was above average.  More complex attention (e.g., working memory) was average. Performance across tasks assessing executive functions (e.g., cognitive flexibility, response inhibition, nonverbal abstract reasoning, analytical problem solving) were exceptionally low to average. Despite well above average range performance on a single task assessing cognitive flexibility (NAB Screening, N&L B), performance exhibited notable calculation errors despite an overall fast performance, inflating this score overall.   Assessed receptive language abilities were within normal limits. Likewise, Ms. Konicki did not exhibit any difficulties comprehending task instructions and answered all questions asked of her appropriately. Assessed expressive language (e.g., verbal fluency and confrontation naming) was below average.     Assessed visuospatial/visuoconstructional abilities were exceptionally low.    Learning (i.e., encoding) of novel verbal and visual information was below average to average. Spontaneous delayed recall (i.e., retrieval) of previously learned information was generally commensurate with performance across initial learning trials. However, variability was noted across a list learning task. Performance across recognition tasks was appropriate, suggesting evidence for appropriate information consolidation.   Results of emotional screening instruments suggested that recent symptoms of generalized anxiety were in the mild range, driven primarily by notable cognitive worry. Symptoms of depression were also within the mild range. A screening instrument assessing recent sleep quality suggested the presence of minimal sleep dysfunction.  Tables of Scores:   Note: This summary of test scores accompanies the interpretive report and should not be considered in isolation without reference to the appropriate sections in the text. Descriptors are based on appropriate normative data and may be adjusted based on clinical judgment. The terms  "impaired" and "within normal limits (WNL)" are used when a more specific level of functioning cannot be determined.       Effort Testing:   DESCRIPTOR       ACS Word Choice: --- --- Within Expectation    *Based on 85 y/o normative data     Dot Counting Test: --- --- Within Expectation  CVLT-III Brief Form Forced Choice Recognition: --- --- Within Expectation       Cognitive Screening:          NAB Screening Battery, Form 2 Standard Score/ T Score Percentile   Total Score 88 21 Below Average    Orientation 29/29 74 Average  Attention Domain 107 68 Average    Digits Forward 57 75 Above Average    Digits Backwards 50 50 Average    Letters & Numbers A Efficiency 38 12 Below Average    Letters & Numbers B Efficiency 67 96 Well Above Average  Language Domain 111 77 Above Average    Auditory Comprehension 55 69 Average    Naming 56 73 Average  Memory Domain 97 42 Average    Shape Learning Immediate Recognition 46 34 Average    Story Learning Immediate Recall 50 50 Average    Shape Learning Delayed Recognition 48 42 Average    Story Learning Delayed Recall 51 54 Average  Spatial Domain 64 1 Exceptionally Low    Visual Discrimination 24 <1 Exceptionally Low    Design Construction 34 5 Well Below Average  Executive Functions Domain 83 13 Below Average    Mazes 53 62 Average  Word Generation 29 2 Exceptionally Low       Intellectual Functioning:           Standard Score Percentile   Test of Premorbid Functioning: 88 21 Below Average       Memory:          Engineer, drilling Test (CVLT-III) Brief Form: Raw Score (Scaled/Standard Score) Percentile     Total Trials 1-4 18/36 (81) 10 Below Average    Short-Delay Free Recall 6/9 (9) 37 Average    Long-Delay Free Recall 3/9 (5) 5 Well Below Average    Long-Delay Cued Recall 5/9 (6) 9 Below Average      Recognition Hits 8/9 (10) 50 Average      False Positive Errors 2 (8) 25 Average       Attention/Executive Function:           Trail Making Test (TMT): Raw Score (T Score) Percentile     Part A 75 secs.,  0 errors (34) 5 Well Below Average    Part B DC'D @ 300,  3 errors --- Impaired       D-KEFS Color-Word Interference Test: Raw Score (Scaled Score) Percentile     Color Naming 38 secs. (9) 37 Average    Word Reading 35 secs. (6) 9 Below Average    Inhibition 156 secs. (2) <1 Exceptionally Low      Total Errors 16 errors (1) <1 Exceptionally Low    Inhibition/Switching 180 secs. (1) <1 Exceptionally Low      Total Errors 6 errors (8) 25 Average       Language:          Verbal Fluency Test: Raw Score (T Score) Percentile     Phonemic Fluency (FAS) 22 (39) 14 Below Average    Animal Fluency 12 (41) 18 Below Average             NAB Language Module, Form 2: T Score Percentile     Naming 40 16 Below Average       Visuospatial/Visuoconstruction:      Raw Score Percentile   Clock Drawing: 6/10 --- Impaired       NAB Spatial Module, Form 2: T Score Percentile     Figure Drawing Copy 29 2 Exceptionally Low        Scaled Score Percentile   WAIS-IV Matrix Reasoning: 5 5 Well Below Average  WAIS-IV Visual Puzzles: 8 25 Average       Mood and Personality:      Raw Score Percentile   Geriatric Depression Scale: 13 --- Mild  Geriatric Anxiety Scale: 19 --- Mild    Somatic 7 --- Mild    Cognitive 10 --- Severe    Affective 2 --- Minimal       Additional Questionnaires:      Raw Score Percentile   PROMIS Sleep Disturbance Questionnaire: 9 --- None to Slight   Informed Consent and Coding/Compliance:   Ms. Rozier was provided with a verbal description of the nature and purpose of the present neuropsychological evaluation. Also reviewed were the foreseeable risks and/or discomforts and benefits of the procedure, limits of confidentiality, and mandatory reporting requirements of this provider. The patient was given the opportunity to ask questions and receive answers about the evaluation. Oral consent to  participate was provided by the patient.   This evaluation was conducted by Christia Reading, Ph.D., licensed clinical neuropsychologist. Ms. Piccini completed a 30-minute clinical interview, billed as one unit [phone],  and 125 minutes of cognitive testing, billed as one unit [phone] and three additional units [id number]. Psychometrist Shan Levans, B.S., assisted Dr. Milbert Coulter with test administration and scoring procedures. As a separate and discrete service, Dr. Milbert Coulter spent a total of 180 minutes in interpretation and report writing, billed as one unit 96132 and two units 96133.

## 2018-11-12 NOTE — Progress Notes (Signed)
   Neuropsychology Note   Elaine Mathews completed 110 minutes of neuropsychological testing with technician, Cruzita Lederer, B.S., under the supervision of Dr. Christia Reading, Ph.D., licensed neuropsychologist. The patient did not appear overtly distressed by the testing session, per behavioral observation or via self-report to the technician. Rest breaks were offered.    In considering the patient's current level of functioning, level of presumed impairment, nature of symptoms, emotional and behavioral responses during the interview, level of literacy, and observed level of motivation/effort, a battery of tests was selected and communicated to the psychometrician.   Communication between the psychologist and technician was ongoing throughout the testing session and changes were made as deemed necessary based on patient performance on testing, technician observations and additional pertinent factors such as those listed above.   Elaine Mathews will return within approximately two weeks for an interactive feedback session with Dr. Melvyn Novas at which time his test performances, clinical impressions, and treatment recommendations will be reviewed in detail. The patient understands she can contact our office should she require our assistance before this time.   Full report to follow.  110 minutes were spent face-to-face with patient administering standardized tests and 15 minutes were spent scoring (technician). [CPT [id number], [id number]]

## 2018-11-13 ENCOUNTER — Encounter: Payer: Self-pay | Admitting: Psychology

## 2018-11-13 DIAGNOSIS — F0670 Mild neurocognitive disorder due to known physiological condition without behavioral disturbance: Secondary | ICD-10-CM

## 2018-11-13 DIAGNOSIS — F01A0 Vascular dementia, mild, without behavioral disturbance, psychotic disturbance, mood disturbance, and anxiety: Secondary | ICD-10-CM | POA: Insufficient documentation

## 2018-11-13 DIAGNOSIS — F0150 Vascular dementia without behavioral disturbance: Secondary | ICD-10-CM | POA: Insufficient documentation

## 2018-11-13 HISTORY — DX: Vascular dementia, mild, without behavioral disturbance, psychotic disturbance, mood disturbance, and anxiety: F01.A0

## 2018-11-13 HISTORY — DX: Vascular dementia without behavioral disturbance: F01.50

## 2018-11-13 HISTORY — DX: Mild neurocognitive disorder due to known physiological condition without behavioral disturbance: F06.70

## 2018-11-18 ENCOUNTER — Ambulatory Visit: Payer: Medicare Other | Admitting: Cardiology

## 2018-11-18 ENCOUNTER — Other Ambulatory Visit: Payer: Self-pay | Admitting: Cardiology

## 2018-11-18 DIAGNOSIS — I480 Paroxysmal atrial fibrillation: Secondary | ICD-10-CM

## 2018-11-19 ENCOUNTER — Telehealth: Payer: Self-pay | Admitting: Cardiology

## 2018-11-19 NOTE — Telephone Encounter (Signed)
Wants her monitor results  °

## 2018-11-20 NOTE — Telephone Encounter (Signed)
Monitor results relayed to DPR, no further questions

## 2018-11-20 NOTE — Telephone Encounter (Signed)
Patient is very anxious and wants resuls, the son called x 2 .Marland Kitchen

## 2018-11-26 ENCOUNTER — Telehealth: Payer: Self-pay

## 2018-11-26 NOTE — Telephone Encounter (Signed)
-----   Message from Rajan R Revankar, MD sent at 11/20/2018  4:56 PM EDT ----- The results of the study is unremarkable. Please inform patient. I will discuss in detail at next appointment. Cc  primary care/referring physician Rajan R Revankar, MD 11/20/2018 4:56 PM 

## 2018-11-26 NOTE — Telephone Encounter (Signed)
Results relayed to [facility]), copy sent to Dr. Rica Records

## 2018-12-03 ENCOUNTER — Ambulatory Visit (INDEPENDENT_AMBULATORY_CARE_PROVIDER_SITE_OTHER): Payer: Medicare Other | Admitting: Psychology

## 2018-12-03 ENCOUNTER — Other Ambulatory Visit: Payer: Self-pay

## 2018-12-03 ENCOUNTER — Encounter: Payer: Self-pay | Admitting: Psychology

## 2018-12-03 DIAGNOSIS — F0150 Vascular dementia without behavioral disturbance: Secondary | ICD-10-CM

## 2018-12-03 DIAGNOSIS — F01A0 Vascular dementia, mild, without behavioral disturbance, psychotic disturbance, mood disturbance, and anxiety: Secondary | ICD-10-CM

## 2018-12-03 DIAGNOSIS — F0670 Mild neurocognitive disorder due to known physiological condition without behavioral disturbance: Secondary | ICD-10-CM

## 2018-12-03 NOTE — Progress Notes (Signed)
   Neuropsychology Feedback Session Elaine Mathews. [place] Department of Neurology  Reason for Referral:   Elaine Mathews a 85 y.o. Caucasian female referred by Ellouise Newer, M.D.,to characterize hercurrent cognitive functioning and assist with diagnostic clarity and treatment planning in the context of recent seizure onset, history of right parietal stroke, and several medical comorbidities.  Feedback:   [name] completed a comprehensive neuropsychological evaluation on 11/12/2018. Please refer to that encounter for the full report. Briefly, results suggested primary areas of weakness surrounding visuospatial and executive functioning. Additional performance variability was seen across processing speed and retrieval aspects of memory. Cognitive performance is largely consistent with what can be seen in individuals with a prior right parietal stroke, chronic small vessel disease, and a history of various cardiovascular ailments, including prior heart attack. 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 Recommendations included a repeat evaluation in 12-18 months, follow-up with an optometrist for ongoing visual acuity concerns, and consideration of a driving evaluation prior to resuming driving.  [name] was was joined by her son on the phone. Content of the current session focused on the results of the evaluation. We also discussed driving. I stated my recommendation for a driving evaluation given deficits in visual acuity, visuospatial functioning, and cognitive flexibility. Unfortunately, this recommendation was not well received. They were encouraged to follow-up with Dr. Delice Lesch if ultimately interested in this evaluation so that she could place a referral. Her son noted that she was driving prior to her seizure without issue and that her 6 month seizure-related driving restrictions  would end in November. [name] and her son were given the opportunity to ask questions and their questions were answered. She was also encouraged to reach out should additional questions arise.     A total of 15 minutes were spent with [name] during the current feedback session.

## 2018-12-16 ENCOUNTER — Other Ambulatory Visit: Payer: Self-pay

## 2018-12-16 ENCOUNTER — Encounter: Payer: Self-pay | Admitting: Cardiology

## 2018-12-16 ENCOUNTER — Ambulatory Visit (INDEPENDENT_AMBULATORY_CARE_PROVIDER_SITE_OTHER): Payer: Medicare Other | Admitting: Cardiology

## 2018-12-16 VITALS — BP 142/56 | HR 62 | Ht 64.0 in | Wt 126.8 lb

## 2018-12-16 DIAGNOSIS — I10 Essential (primary) hypertension: Secondary | ICD-10-CM

## 2018-12-16 DIAGNOSIS — I2510 Atherosclerotic heart disease of native coronary artery without angina pectoris: Secondary | ICD-10-CM | POA: Diagnosis not present

## 2018-12-16 DIAGNOSIS — I480 Paroxysmal atrial fibrillation: Secondary | ICD-10-CM | POA: Diagnosis not present

## 2018-12-16 NOTE — Progress Notes (Signed)
Cardiology Office Note:    Date:  12/16/2018   ID:  Elaine Mathews, DOB [birth year], MRN [id number]  PCP:  Nicholos Johns, MD  Cardiologist:  Jenean Lindau, MD   Referring MD: Nicholos Johns, MD    ASSESSMENT:    1. Paroxysmal atrial fibrillation (HCC)   2. Essential hypertension   3. Coronary artery disease involving native coronary artery of native heart without angina pectoris    PLAN:    In order of problems listed above:  1. Coronary artery disease: Secondary prevention stressed with the patient.  Importance of compliance with diet and medication stressed and she vocalized understanding. 2. Mixed dyslipidemia: I reviewed her records from recently and her lipids are fine and these were done by primary care physician. 3. Essential hypertension: Blood pressure stable and she has brought her home readings.  Patient appears to be stable from cardiovascular standpoint.  She will continue antiplatelet and anticoagulation. 4. Paroxysmal atrial fibrillation:I discussed with the patient atrial fibrillation, disease process. Management and therapy including rate and rhythm control, anticoagulation benefits and potential risks were discussed extensively with the patient. Patient had multiple questions which were answered to patient's satisfaction. 5. She has history of seizures and this will be managed by her primary care physician. 6. Patient will be seen in follow-up appointment in 6 months or earlier if the patient has any concerns    Medication Adjustments/Labs and Tests Ordered: Current medicines are reviewed at length with the patient today.  Concerns regarding medicines are outlined above.  No orders of the defined types were placed in this encounter.  No orders of the defined types were placed in this encounter.    No chief complaint on file.    History of Present Illness:    Elaine Mathews is a 85 y.o. female.  Patient has past medical history of coronary artery disease and is  post stenting.  Details are mentioned below.  She has history of paroxysmal atrial fibrillation.  She denies any problems at this time and takes care of activities of daily living.  No chest pain orthopnea or PND.  She is ambulating in and around the house without any problems.  At the time of my evaluation, the patient is alert awake oriented and in no distress.  Past Medical History:  Diagnosis Date  . Allergic rhinosinusitis   . Arthritis   . Ataxia    With generalized weakness in legs  . CAD (coronary artery disease)   . Cataract, bilateral   . Controlled type 2 diabetes with neuropathy ([place]) 01/06/2017  . Elevated liver enzymes   . Essential hypertension 11/13/2017  . Fatty liver   . Foot lesion   . Hyperlipidemia   . Hypothyroidism   . Mild asthma   . NSTEMI (non-ST elevated myocardial infarction) ([place]) 07/12/2018  . Paroxysmal atrial fibrillation ([place]) 11/13/2017  . PVD (peripheral vascular disease) ([place])   . Seizure ([place])   . Spleen hematoma without rupture of capsule, without open wound into cavity 07/15/2018  . Stroke [facility])    Right parietal  . Vitamin D deficiency     Past Surgical History:  Procedure Laterality Date  . APPENDECTOMY    . CATARACT EXTRACTION Bilateral   . CHOLECYSTECTOMY    . CORONARY STENT INTERVENTION N/A 07/13/2018   Procedure: CORONARY STENT INTERVENTION;  Surgeon: Nelva Bush, MD;  Location: [place] CV LAB;  Service: Cardiovascular;  Laterality: N/A;  . INTRAVASCULAR PRESSURE WIRE/FFR STUDY N/A 07/13/2018   Procedure:  INTRAVASCULAR PRESSURE WIRE/FFR STUDY;  Surgeon: Yvonne KendallEnd, Christopher, MD;  Location: [facility] INVASIVE CV LAB;  Service: Cardiovascular;  Laterality: N/A;  . LEFT HEART CATH AND CORONARY ANGIOGRAPHY N/A 07/13/2018   Procedure: LEFT HEART CATH AND CORONARY ANGIOGRAPHY;  Surgeon: Yvonne KendallEnd, Christopher, MD;  Location: [facility] INVASIVE CV LAB;  Service: Cardiovascular;  Laterality: N/A;  . TONSILLECTOMY      Current Medications: Current Meds   Medication Sig  . atorvastatin (LIPITOR) 40 MG tablet Take 1 tablet (40 mg total) by mouth daily at 6 PM.  . clopidogrel (PLAVIX) 75 MG tablet Take 1 tablet (75 mg total) by mouth daily with breakfast.  . colestipol (COLESTID) 1 g tablet Take 1 g by mouth daily.  Marland Kitchen. diltiazem (CARDIZEM CD) 180 MG 24 hr capsule Take 180 mg by mouth daily.  . ergocalciferol (VITAMIN D2) 50000 units capsule Take 50,000 Units by mouth every Wednesday.   . ezetimibe (ZETIA) 10 MG tablet Take 10 mg by mouth at bedtime.   . famotidine (PEPCID) 40 MG tablet Take 40 mg by mouth daily.  . fluticasone (FLONASE) 50 MCG/ACT nasal spray Place 2 sprays into both nostrils daily.  . Glucosamine-Chondroit-Vit C-Mn (GLUCOSAMINE 1500 COMPLEX PO) Take 1 capsule by mouth daily at 12 noon.   Marland Kitchen. glucose blood test strip 1 each by Other route as needed. Use as instructed  . levETIRAcetam (KEPPRA) 250 MG tablet Take 1 tablet in the morning and take 2 tablets in the evening  . levothyroxine (SYNTHROID, LEVOTHROID) 75 MCG tablet Take 75 mcg by mouth as directed. Take 1.5 tablets daily Mon-Fri and 1 tablet on Sat and Sun  . memantine (NAMENDA) 10 MG tablet 10 mg 2 (two) times daily.   . montelukast (SINGULAIR) 10 MG tablet Take 10 mg by mouth at bedtime.  . nitroGLYCERIN (NITROSTAT) 0.4 MG SL tablet Place 0.4 mg under the tongue every 5 (five) minutes as needed for chest pain.      Allergies:   Codeine and Penicillins   Social History   Socioeconomic History  . Marital status: Divorced    Spouse name: Not on file  . Number of children: Not on file  . Years of education: Not on file  . Highest education level: Not on file  Occupational History  . Not on file  Social Needs  . Financial resource strain: Not on file  . Food insecurity    Worry: Not on file    Inability: Not on file  . Transportation needs    Medical: Not on file    Non-medical: Not on file  Tobacco Use  . Smoking status: Former Smoker    Types: Cigarettes  .  Smokeless tobacco: Never Used  Substance and Sexual Activity  . Alcohol use: No  . Drug use: No  . Sexual activity: Not Currently    Partners: Male  Lifestyle  . Physical activity    Days per week: Not on file    Minutes per session: Not on file  . Stress: Not on file  Relationships  . Social Musicianconnections    Talks on phone: Not on file    Gets together: Not on file    Attends religious service: Not on file    Active member of club or organization: Not on file    Attends meetings of clubs or organizations: Not on file    Relationship status: Not on file  Other Topics Concern  . Not on file  Social History Narrative   Lives with  Highest level of edu-      Right handed     Family History: The patient's family history includes COPD in her son; Heart attack in her mother and son; Hypercholesterolemia in her mother; Hypertension in her father and mother; Stroke in her father and mother.  ROS:   Please see the history of present illness.    All other systems reviewed and are negative.  EKGs/Labs/Other Studies Reviewed:    The following studies were reviewed today: End, Cristal Deer, MD (Primary)    Procedures  CORONARY STENT INTERVENTION  INTRAVASCULAR PRESSURE WIRE/FFR STUDY  LEFT HEART CATH AND CORONARY ANGIOGRAPHY  Conclusion  Conclusions: 7. Two vessel coronary artery disease with multifocal LAD (40% proximal, 60-70% mid, and 80% apical stenoses). Proximal and mid LAD disease is highly significant by DFR (0.74). Culpril lesion for NSTEMI is likely occluded small OM2 branch, which is too small for PCI. 8. Normal left ventricular systolic function with mildly elevated filling pressure (LVEDP 15-20 mmHg). 9. Successful DFR-guided PCI to the proximal/mid LAD using a Resolute Onyx 2.75 x 18 mm drug-eluting stent with 0% residual stenosis and TIMI-3 flow.  Recommendations 1. Medical therapy for occluded OM2 branch and apical LAD disease. 2. Restart heparin 2 hours  after TR band removal, given history of paroxysmal atrial fibrillation. If no bleeding complications, recommend discontinuation of aspirin tomorrow and initiation of rivaroxaban 20 mg daily. Rivaroxaban and clopidogrel should be continued for at least 12 months, if possible. 3. Aggressive secondary prevention.  Yvonne Kendall, MD [facility] Pager: [phone]    EVENT MONITOR REPORT:   Patient was monitored from 10/22/2018 to 11/05/2018. Indication:                    Paroxysmal atrial fibrillation Ordering physician:  Garwin Brothers, MD  Referring physician:        Garwin Brothers, MD    Baseline rhythm: Sinus  Minimum heart rate: 50 BPM.  Average heart rate: 76 BPM.  Maximal heart rate 136 BPM.  Maximum heart rate at a very brief asymptomatic  episode of atrial run was 179.  Atrial arrhythmia: None significant except rare few atrial runs  Ventricular arrhythmia: Occasional PVCs  Conduction abnormality: None significant  Symptoms: None significant   Conclusion:  Unremarkable event monitoring.  Interpreting  cardiologist: Garwin Brothers, MD  Date: 11/20/2018 10:20 AM  Recent Labs: 07/13/2018: ALT 38; TSH 3.049 07/14/2018: Magnesium 2.5 07/16/2018: BUN 10; Creatinine, Ser 0.74; Potassium 4.2; Sodium 133 07/17/2018: Hemoglobin 9.9; Platelets 151  Recent Lipid Panel    Component Value Date/Time   CHOL 197 07/13/2018 0234   TRIG 116 07/13/2018 0234   HDL 53 07/13/2018 0234   CHOLHDL 3.7 07/13/2018 0234   VLDL 23 07/13/2018 0234   LDLCALC 121 (H) 07/13/2018 0234    Physical Exam:    VS:  BP (!) 142/56 (BP Location: Left Arm, Patient Position: Sitting, Cuff Size: Normal)   Pulse 62   Ht 5\' 4"  (1.626 m)   Wt 126 lb 12.8 oz (57.5 kg)   SpO2 98%   BMI 21.77 kg/m     Wt Readings from Last 3 Encounters:  12/16/18 126 lb 12.8 oz (57.5 kg)  10/26/18 122 lb (55.3 kg)  10/21/18 120 lb (54.4 kg)     GEN: Patient is in no acute distress HEENT:  Normal NECK: No JVD; No carotid bruits LYMPHATICS: No lymphadenopathy CARDIAC: Hear sounds regular, 2/6 systolic murmur at the apex. RESPIRATORY:  Clear to  auscultation without rales, wheezing or rhonchi  ABDOMEN: Soft, non-tender, non-distended MUSCULOSKELETAL:  No edema; No deformity  SKIN: Warm and dry NEUROLOGIC:  Alert and oriented x 3 PSYCHIATRIC:  Normal affect   Signed, Garwin Brothers, MD  12/16/2018 3:46 PM    [facility]

## 2018-12-16 NOTE — Patient Instructions (Signed)

## 2018-12-18 ENCOUNTER — Telehealth: Payer: Self-pay | Admitting: Neurology

## 2018-12-18 NOTE — Telephone Encounter (Signed)
Son called in and wanting to speak with the nurse regarding results on testing and is she not allowed to drive for 6 months. Please call him back and clarify this. Thanks!

## 2018-12-18 NOTE — Telephone Encounter (Signed)
Son called requesting advise on how to legally stop pt from driving due to safety concerns. Web address given    [email or web address]

## 2018-12-21 ENCOUNTER — Telehealth: Payer: Self-pay | Admitting: Neurology

## 2018-12-21 NOTE — Telephone Encounter (Signed)
Patient called to see if/when it is okay for her to start driving again.   She'd like Dr. Nicholos Johns at [facility] [phone]) to be made aware if there are any changes.

## 2018-12-21 NOTE — Telephone Encounter (Signed)
Per Dr. Gustavus Bryant last offic note --Recommendations included a repeat evaluation in 12-18 months, follow-up with an optometrist for ongoing visual acuity concerns, and consideration of a driving evaluation prior to resuming driving.

## 2018-12-23 NOTE — Telephone Encounter (Signed)
Patient is calling in about driving and needing more info. She also would like the info faxed to Dr. Lauralee Evener office. Thanks!

## 2018-12-23 NOTE — Telephone Encounter (Signed)
Spoke with pt and informed her that Dr. Melvyn Novas wanted her to see an optometrist and to have a driving evaluation prior to resuming driving. Pt would like to have office note sent to her PCP Dr. Nicholos Johns. Chart was routed to Dr. Rica Records.

## 2018-12-31 DIAGNOSIS — M79605 Pain in left leg: Secondary | ICD-10-CM | POA: Diagnosis not present

## 2019-01-27 DIAGNOSIS — Z9049 Acquired absence of other specified parts of digestive tract: Secondary | ICD-10-CM | POA: Diagnosis not present

## 2019-01-27 DIAGNOSIS — R413 Other amnesia: Secondary | ICD-10-CM | POA: Diagnosis not present

## 2019-01-27 DIAGNOSIS — I252 Old myocardial infarction: Secondary | ICD-10-CM | POA: Diagnosis not present

## 2019-01-27 DIAGNOSIS — M1990 Unspecified osteoarthritis, unspecified site: Secondary | ICD-10-CM | POA: Diagnosis not present

## 2019-01-27 DIAGNOSIS — J45909 Unspecified asthma, uncomplicated: Secondary | ICD-10-CM | POA: Diagnosis not present

## 2019-01-27 DIAGNOSIS — Z7902 Long term (current) use of antithrombotics/antiplatelets: Secondary | ICD-10-CM | POA: Diagnosis not present

## 2019-01-27 DIAGNOSIS — J309 Allergic rhinitis, unspecified: Secondary | ICD-10-CM | POA: Diagnosis not present

## 2019-01-27 DIAGNOSIS — K760 Fatty (change of) liver, not elsewhere classified: Secondary | ICD-10-CM | POA: Diagnosis not present

## 2019-01-27 DIAGNOSIS — I2510 Atherosclerotic heart disease of native coronary artery without angina pectoris: Secondary | ICD-10-CM | POA: Diagnosis not present

## 2019-01-27 DIAGNOSIS — E1142 Type 2 diabetes mellitus with diabetic polyneuropathy: Secondary | ICD-10-CM | POA: Diagnosis not present

## 2019-01-27 DIAGNOSIS — E782 Mixed hyperlipidemia: Secondary | ICD-10-CM | POA: Diagnosis not present

## 2019-01-27 DIAGNOSIS — R600 Localized edema: Secondary | ICD-10-CM | POA: Diagnosis not present

## 2019-01-27 DIAGNOSIS — E039 Hypothyroidism, unspecified: Secondary | ICD-10-CM | POA: Diagnosis not present

## 2019-01-27 DIAGNOSIS — Z7984 Long term (current) use of oral hypoglycemic drugs: Secondary | ICD-10-CM | POA: Diagnosis not present

## 2019-01-27 DIAGNOSIS — I10 Essential (primary) hypertension: Secondary | ICD-10-CM | POA: Diagnosis not present

## 2019-01-27 DIAGNOSIS — R270 Ataxia, unspecified: Secondary | ICD-10-CM | POA: Diagnosis not present

## 2019-01-27 DIAGNOSIS — M5442 Lumbago with sciatica, left side: Secondary | ICD-10-CM | POA: Diagnosis not present

## 2019-01-27 DIAGNOSIS — Z602 Problems related to living alone: Secondary | ICD-10-CM | POA: Diagnosis not present

## 2019-01-27 DIAGNOSIS — I480 Paroxysmal atrial fibrillation: Secondary | ICD-10-CM | POA: Diagnosis not present

## 2019-01-27 DIAGNOSIS — I739 Peripheral vascular disease, unspecified: Secondary | ICD-10-CM | POA: Diagnosis not present

## 2019-01-27 DIAGNOSIS — E559 Vitamin D deficiency, unspecified: Secondary | ICD-10-CM | POA: Diagnosis not present

## 2019-01-27 DIAGNOSIS — Z7901 Long term (current) use of anticoagulants: Secondary | ICD-10-CM | POA: Diagnosis not present

## 2019-01-27 DIAGNOSIS — M25562 Pain in left knee: Secondary | ICD-10-CM | POA: Diagnosis not present

## 2019-01-28 DIAGNOSIS — M545 Low back pain: Secondary | ICD-10-CM | POA: Diagnosis not present

## 2019-01-28 DIAGNOSIS — M1610 Unilateral primary osteoarthritis, unspecified hip: Secondary | ICD-10-CM | POA: Diagnosis not present

## 2019-01-28 DIAGNOSIS — S93491A Sprain of other ligament of right ankle, initial encounter: Secondary | ICD-10-CM | POA: Diagnosis not present

## 2019-01-29 DIAGNOSIS — S93401A Sprain of unspecified ligament of right ankle, initial encounter: Secondary | ICD-10-CM | POA: Diagnosis not present

## 2019-01-29 DIAGNOSIS — Z742 Need for assistance at home and no other household member able to render care: Secondary | ICD-10-CM | POA: Diagnosis not present

## 2019-01-29 DIAGNOSIS — R296 Repeated falls: Secondary | ICD-10-CM | POA: Diagnosis not present

## 2019-01-29 DIAGNOSIS — J310 Chronic rhinitis: Secondary | ICD-10-CM | POA: Diagnosis not present

## 2019-02-01 DIAGNOSIS — E1142 Type 2 diabetes mellitus with diabetic polyneuropathy: Secondary | ICD-10-CM | POA: Diagnosis not present

## 2019-02-01 DIAGNOSIS — I10 Essential (primary) hypertension: Secondary | ICD-10-CM | POA: Diagnosis not present

## 2019-02-01 DIAGNOSIS — R413 Other amnesia: Secondary | ICD-10-CM | POA: Diagnosis not present

## 2019-02-01 DIAGNOSIS — M1990 Unspecified osteoarthritis, unspecified site: Secondary | ICD-10-CM | POA: Diagnosis not present

## 2019-02-01 DIAGNOSIS — E559 Vitamin D deficiency, unspecified: Secondary | ICD-10-CM | POA: Diagnosis not present

## 2019-02-01 DIAGNOSIS — Z7901 Long term (current) use of anticoagulants: Secondary | ICD-10-CM | POA: Diagnosis not present

## 2019-02-01 DIAGNOSIS — J309 Allergic rhinitis, unspecified: Secondary | ICD-10-CM | POA: Diagnosis not present

## 2019-02-01 DIAGNOSIS — R600 Localized edema: Secondary | ICD-10-CM | POA: Diagnosis not present

## 2019-02-01 DIAGNOSIS — J310 Chronic rhinitis: Secondary | ICD-10-CM | POA: Diagnosis not present

## 2019-02-01 DIAGNOSIS — R270 Ataxia, unspecified: Secondary | ICD-10-CM | POA: Diagnosis not present

## 2019-02-01 DIAGNOSIS — Z602 Problems related to living alone: Secondary | ICD-10-CM | POA: Diagnosis not present

## 2019-02-01 DIAGNOSIS — J329 Chronic sinusitis, unspecified: Secondary | ICD-10-CM | POA: Diagnosis not present

## 2019-02-01 DIAGNOSIS — I252 Old myocardial infarction: Secondary | ICD-10-CM | POA: Diagnosis not present

## 2019-02-01 DIAGNOSIS — I2510 Atherosclerotic heart disease of native coronary artery without angina pectoris: Secondary | ICD-10-CM | POA: Diagnosis not present

## 2019-02-01 DIAGNOSIS — Z7902 Long term (current) use of antithrombotics/antiplatelets: Secondary | ICD-10-CM | POA: Diagnosis not present

## 2019-02-01 DIAGNOSIS — E039 Hypothyroidism, unspecified: Secondary | ICD-10-CM | POA: Diagnosis not present

## 2019-02-01 DIAGNOSIS — J45909 Unspecified asthma, uncomplicated: Secondary | ICD-10-CM | POA: Diagnosis not present

## 2019-02-01 DIAGNOSIS — I739 Peripheral vascular disease, unspecified: Secondary | ICD-10-CM | POA: Diagnosis not present

## 2019-02-01 DIAGNOSIS — M25562 Pain in left knee: Secondary | ICD-10-CM | POA: Diagnosis not present

## 2019-02-01 DIAGNOSIS — S93401A Sprain of unspecified ligament of right ankle, initial encounter: Secondary | ICD-10-CM | POA: Diagnosis not present

## 2019-02-01 DIAGNOSIS — R296 Repeated falls: Secondary | ICD-10-CM | POA: Diagnosis not present

## 2019-02-01 DIAGNOSIS — I480 Paroxysmal atrial fibrillation: Secondary | ICD-10-CM | POA: Diagnosis not present

## 2019-02-01 DIAGNOSIS — Z7984 Long term (current) use of oral hypoglycemic drugs: Secondary | ICD-10-CM | POA: Diagnosis not present

## 2019-02-01 DIAGNOSIS — E782 Mixed hyperlipidemia: Secondary | ICD-10-CM | POA: Diagnosis not present

## 2019-02-01 DIAGNOSIS — K760 Fatty (change of) liver, not elsewhere classified: Secondary | ICD-10-CM | POA: Diagnosis not present

## 2019-02-01 DIAGNOSIS — Z9049 Acquired absence of other specified parts of digestive tract: Secondary | ICD-10-CM | POA: Diagnosis not present

## 2019-02-01 DIAGNOSIS — M5442 Lumbago with sciatica, left side: Secondary | ICD-10-CM | POA: Diagnosis not present

## 2019-02-02 DIAGNOSIS — M25562 Pain in left knee: Secondary | ICD-10-CM | POA: Diagnosis not present

## 2019-02-02 DIAGNOSIS — I10 Essential (primary) hypertension: Secondary | ICD-10-CM | POA: Diagnosis not present

## 2019-02-02 DIAGNOSIS — R270 Ataxia, unspecified: Secondary | ICD-10-CM | POA: Diagnosis not present

## 2019-02-02 DIAGNOSIS — Z602 Problems related to living alone: Secondary | ICD-10-CM | POA: Diagnosis not present

## 2019-02-02 DIAGNOSIS — I739 Peripheral vascular disease, unspecified: Secondary | ICD-10-CM | POA: Diagnosis not present

## 2019-02-02 DIAGNOSIS — R413 Other amnesia: Secondary | ICD-10-CM | POA: Diagnosis not present

## 2019-02-02 DIAGNOSIS — J45909 Unspecified asthma, uncomplicated: Secondary | ICD-10-CM | POA: Diagnosis not present

## 2019-02-02 DIAGNOSIS — E039 Hypothyroidism, unspecified: Secondary | ICD-10-CM | POA: Diagnosis not present

## 2019-02-02 DIAGNOSIS — Z7984 Long term (current) use of oral hypoglycemic drugs: Secondary | ICD-10-CM | POA: Diagnosis not present

## 2019-02-02 DIAGNOSIS — E782 Mixed hyperlipidemia: Secondary | ICD-10-CM | POA: Diagnosis not present

## 2019-02-02 DIAGNOSIS — I2510 Atherosclerotic heart disease of native coronary artery without angina pectoris: Secondary | ICD-10-CM | POA: Diagnosis not present

## 2019-02-02 DIAGNOSIS — Z7902 Long term (current) use of antithrombotics/antiplatelets: Secondary | ICD-10-CM | POA: Diagnosis not present

## 2019-02-02 DIAGNOSIS — I480 Paroxysmal atrial fibrillation: Secondary | ICD-10-CM | POA: Diagnosis not present

## 2019-02-02 DIAGNOSIS — M1990 Unspecified osteoarthritis, unspecified site: Secondary | ICD-10-CM | POA: Diagnosis not present

## 2019-02-02 DIAGNOSIS — E559 Vitamin D deficiency, unspecified: Secondary | ICD-10-CM | POA: Diagnosis not present

## 2019-02-02 DIAGNOSIS — R600 Localized edema: Secondary | ICD-10-CM | POA: Diagnosis not present

## 2019-02-02 DIAGNOSIS — E1142 Type 2 diabetes mellitus with diabetic polyneuropathy: Secondary | ICD-10-CM | POA: Diagnosis not present

## 2019-02-02 DIAGNOSIS — Z7901 Long term (current) use of anticoagulants: Secondary | ICD-10-CM | POA: Diagnosis not present

## 2019-02-02 DIAGNOSIS — Z9049 Acquired absence of other specified parts of digestive tract: Secondary | ICD-10-CM | POA: Diagnosis not present

## 2019-02-02 DIAGNOSIS — J309 Allergic rhinitis, unspecified: Secondary | ICD-10-CM | POA: Diagnosis not present

## 2019-02-02 DIAGNOSIS — I252 Old myocardial infarction: Secondary | ICD-10-CM | POA: Diagnosis not present

## 2019-02-02 DIAGNOSIS — K760 Fatty (change of) liver, not elsewhere classified: Secondary | ICD-10-CM | POA: Diagnosis not present

## 2019-02-02 DIAGNOSIS — M5442 Lumbago with sciatica, left side: Secondary | ICD-10-CM | POA: Diagnosis not present

## 2019-02-04 DIAGNOSIS — E782 Mixed hyperlipidemia: Secondary | ICD-10-CM | POA: Diagnosis not present

## 2019-02-04 DIAGNOSIS — M1990 Unspecified osteoarthritis, unspecified site: Secondary | ICD-10-CM | POA: Diagnosis not present

## 2019-02-04 DIAGNOSIS — Z602 Problems related to living alone: Secondary | ICD-10-CM | POA: Diagnosis not present

## 2019-02-04 DIAGNOSIS — M5442 Lumbago with sciatica, left side: Secondary | ICD-10-CM | POA: Diagnosis not present

## 2019-02-04 DIAGNOSIS — I480 Paroxysmal atrial fibrillation: Secondary | ICD-10-CM | POA: Diagnosis not present

## 2019-02-04 DIAGNOSIS — Z7984 Long term (current) use of oral hypoglycemic drugs: Secondary | ICD-10-CM | POA: Diagnosis not present

## 2019-02-04 DIAGNOSIS — I739 Peripheral vascular disease, unspecified: Secondary | ICD-10-CM | POA: Diagnosis not present

## 2019-02-04 DIAGNOSIS — R413 Other amnesia: Secondary | ICD-10-CM | POA: Diagnosis not present

## 2019-02-04 DIAGNOSIS — E039 Hypothyroidism, unspecified: Secondary | ICD-10-CM | POA: Diagnosis not present

## 2019-02-04 DIAGNOSIS — Z9049 Acquired absence of other specified parts of digestive tract: Secondary | ICD-10-CM | POA: Diagnosis not present

## 2019-02-04 DIAGNOSIS — E1142 Type 2 diabetes mellitus with diabetic polyneuropathy: Secondary | ICD-10-CM | POA: Diagnosis not present

## 2019-02-04 DIAGNOSIS — J309 Allergic rhinitis, unspecified: Secondary | ICD-10-CM | POA: Diagnosis not present

## 2019-02-04 DIAGNOSIS — I252 Old myocardial infarction: Secondary | ICD-10-CM | POA: Diagnosis not present

## 2019-02-04 DIAGNOSIS — R600 Localized edema: Secondary | ICD-10-CM | POA: Diagnosis not present

## 2019-02-04 DIAGNOSIS — I10 Essential (primary) hypertension: Secondary | ICD-10-CM | POA: Diagnosis not present

## 2019-02-04 DIAGNOSIS — I2510 Atherosclerotic heart disease of native coronary artery without angina pectoris: Secondary | ICD-10-CM | POA: Diagnosis not present

## 2019-02-04 DIAGNOSIS — Z7901 Long term (current) use of anticoagulants: Secondary | ICD-10-CM | POA: Diagnosis not present

## 2019-02-04 DIAGNOSIS — M25562 Pain in left knee: Secondary | ICD-10-CM | POA: Diagnosis not present

## 2019-02-04 DIAGNOSIS — K760 Fatty (change of) liver, not elsewhere classified: Secondary | ICD-10-CM | POA: Diagnosis not present

## 2019-02-04 DIAGNOSIS — Z7902 Long term (current) use of antithrombotics/antiplatelets: Secondary | ICD-10-CM | POA: Diagnosis not present

## 2019-02-04 DIAGNOSIS — J45909 Unspecified asthma, uncomplicated: Secondary | ICD-10-CM | POA: Diagnosis not present

## 2019-02-04 DIAGNOSIS — R270 Ataxia, unspecified: Secondary | ICD-10-CM | POA: Diagnosis not present

## 2019-02-04 DIAGNOSIS — E559 Vitamin D deficiency, unspecified: Secondary | ICD-10-CM | POA: Diagnosis not present

## 2019-02-08 DIAGNOSIS — I739 Peripheral vascular disease, unspecified: Secondary | ICD-10-CM | POA: Diagnosis not present

## 2019-02-08 DIAGNOSIS — R413 Other amnesia: Secondary | ICD-10-CM | POA: Diagnosis not present

## 2019-02-08 DIAGNOSIS — Z602 Problems related to living alone: Secondary | ICD-10-CM | POA: Diagnosis not present

## 2019-02-08 DIAGNOSIS — E039 Hypothyroidism, unspecified: Secondary | ICD-10-CM | POA: Diagnosis not present

## 2019-02-08 DIAGNOSIS — E559 Vitamin D deficiency, unspecified: Secondary | ICD-10-CM | POA: Diagnosis not present

## 2019-02-08 DIAGNOSIS — I480 Paroxysmal atrial fibrillation: Secondary | ICD-10-CM | POA: Diagnosis not present

## 2019-02-08 DIAGNOSIS — I252 Old myocardial infarction: Secondary | ICD-10-CM | POA: Diagnosis not present

## 2019-02-08 DIAGNOSIS — E1142 Type 2 diabetes mellitus with diabetic polyneuropathy: Secondary | ICD-10-CM | POA: Diagnosis not present

## 2019-02-08 DIAGNOSIS — M5442 Lumbago with sciatica, left side: Secondary | ICD-10-CM | POA: Diagnosis not present

## 2019-02-08 DIAGNOSIS — Z7901 Long term (current) use of anticoagulants: Secondary | ICD-10-CM | POA: Diagnosis not present

## 2019-02-08 DIAGNOSIS — M25562 Pain in left knee: Secondary | ICD-10-CM | POA: Diagnosis not present

## 2019-02-08 DIAGNOSIS — R600 Localized edema: Secondary | ICD-10-CM | POA: Diagnosis not present

## 2019-02-08 DIAGNOSIS — J45909 Unspecified asthma, uncomplicated: Secondary | ICD-10-CM | POA: Diagnosis not present

## 2019-02-08 DIAGNOSIS — R270 Ataxia, unspecified: Secondary | ICD-10-CM | POA: Diagnosis not present

## 2019-02-08 DIAGNOSIS — M1990 Unspecified osteoarthritis, unspecified site: Secondary | ICD-10-CM | POA: Diagnosis not present

## 2019-02-08 DIAGNOSIS — Z7984 Long term (current) use of oral hypoglycemic drugs: Secondary | ICD-10-CM | POA: Diagnosis not present

## 2019-02-08 DIAGNOSIS — E782 Mixed hyperlipidemia: Secondary | ICD-10-CM | POA: Diagnosis not present

## 2019-02-08 DIAGNOSIS — Z7902 Long term (current) use of antithrombotics/antiplatelets: Secondary | ICD-10-CM | POA: Diagnosis not present

## 2019-02-08 DIAGNOSIS — I2510 Atherosclerotic heart disease of native coronary artery without angina pectoris: Secondary | ICD-10-CM | POA: Diagnosis not present

## 2019-02-08 DIAGNOSIS — K760 Fatty (change of) liver, not elsewhere classified: Secondary | ICD-10-CM | POA: Diagnosis not present

## 2019-02-08 DIAGNOSIS — Z9049 Acquired absence of other specified parts of digestive tract: Secondary | ICD-10-CM | POA: Diagnosis not present

## 2019-02-08 DIAGNOSIS — I10 Essential (primary) hypertension: Secondary | ICD-10-CM | POA: Diagnosis not present

## 2019-02-08 DIAGNOSIS — J309 Allergic rhinitis, unspecified: Secondary | ICD-10-CM | POA: Diagnosis not present

## 2019-02-12 DIAGNOSIS — I10 Essential (primary) hypertension: Secondary | ICD-10-CM | POA: Diagnosis not present

## 2019-02-12 DIAGNOSIS — Z8673 Personal history of transient ischemic attack (TIA), and cerebral infarction without residual deficits: Secondary | ICD-10-CM | POA: Diagnosis not present

## 2019-02-15 DIAGNOSIS — I10 Essential (primary) hypertension: Secondary | ICD-10-CM | POA: Diagnosis not present

## 2019-02-15 DIAGNOSIS — E039 Hypothyroidism, unspecified: Secondary | ICD-10-CM | POA: Diagnosis not present

## 2019-02-15 DIAGNOSIS — R270 Ataxia, unspecified: Secondary | ICD-10-CM | POA: Diagnosis not present

## 2019-02-15 DIAGNOSIS — Z9049 Acquired absence of other specified parts of digestive tract: Secondary | ICD-10-CM | POA: Diagnosis not present

## 2019-02-15 DIAGNOSIS — Z7902 Long term (current) use of antithrombotics/antiplatelets: Secondary | ICD-10-CM | POA: Diagnosis not present

## 2019-02-15 DIAGNOSIS — M1990 Unspecified osteoarthritis, unspecified site: Secondary | ICD-10-CM | POA: Diagnosis not present

## 2019-02-15 DIAGNOSIS — I480 Paroxysmal atrial fibrillation: Secondary | ICD-10-CM | POA: Diagnosis not present

## 2019-02-15 DIAGNOSIS — R413 Other amnesia: Secondary | ICD-10-CM | POA: Diagnosis not present

## 2019-02-15 DIAGNOSIS — M5442 Lumbago with sciatica, left side: Secondary | ICD-10-CM | POA: Diagnosis not present

## 2019-02-15 DIAGNOSIS — I739 Peripheral vascular disease, unspecified: Secondary | ICD-10-CM | POA: Diagnosis not present

## 2019-02-15 DIAGNOSIS — J309 Allergic rhinitis, unspecified: Secondary | ICD-10-CM | POA: Diagnosis not present

## 2019-02-15 DIAGNOSIS — E782 Mixed hyperlipidemia: Secondary | ICD-10-CM | POA: Diagnosis not present

## 2019-02-15 DIAGNOSIS — Z602 Problems related to living alone: Secondary | ICD-10-CM | POA: Diagnosis not present

## 2019-02-15 DIAGNOSIS — E1142 Type 2 diabetes mellitus with diabetic polyneuropathy: Secondary | ICD-10-CM | POA: Diagnosis not present

## 2019-02-15 DIAGNOSIS — I252 Old myocardial infarction: Secondary | ICD-10-CM | POA: Diagnosis not present

## 2019-02-15 DIAGNOSIS — K760 Fatty (change of) liver, not elsewhere classified: Secondary | ICD-10-CM | POA: Diagnosis not present

## 2019-02-15 DIAGNOSIS — R600 Localized edema: Secondary | ICD-10-CM | POA: Diagnosis not present

## 2019-02-15 DIAGNOSIS — J45909 Unspecified asthma, uncomplicated: Secondary | ICD-10-CM | POA: Diagnosis not present

## 2019-02-15 DIAGNOSIS — M25562 Pain in left knee: Secondary | ICD-10-CM | POA: Diagnosis not present

## 2019-02-15 DIAGNOSIS — I2510 Atherosclerotic heart disease of native coronary artery without angina pectoris: Secondary | ICD-10-CM | POA: Diagnosis not present

## 2019-02-15 DIAGNOSIS — Z7984 Long term (current) use of oral hypoglycemic drugs: Secondary | ICD-10-CM | POA: Diagnosis not present

## 2019-02-15 DIAGNOSIS — Z7901 Long term (current) use of anticoagulants: Secondary | ICD-10-CM | POA: Diagnosis not present

## 2019-02-15 DIAGNOSIS — E559 Vitamin D deficiency, unspecified: Secondary | ICD-10-CM | POA: Diagnosis not present

## 2019-02-18 DIAGNOSIS — I739 Peripheral vascular disease, unspecified: Secondary | ICD-10-CM | POA: Diagnosis not present

## 2019-02-18 DIAGNOSIS — Z7984 Long term (current) use of oral hypoglycemic drugs: Secondary | ICD-10-CM | POA: Diagnosis not present

## 2019-02-18 DIAGNOSIS — I252 Old myocardial infarction: Secondary | ICD-10-CM | POA: Diagnosis not present

## 2019-02-18 DIAGNOSIS — Z9049 Acquired absence of other specified parts of digestive tract: Secondary | ICD-10-CM | POA: Diagnosis not present

## 2019-02-18 DIAGNOSIS — E1142 Type 2 diabetes mellitus with diabetic polyneuropathy: Secondary | ICD-10-CM | POA: Diagnosis not present

## 2019-02-18 DIAGNOSIS — I2510 Atherosclerotic heart disease of native coronary artery without angina pectoris: Secondary | ICD-10-CM | POA: Diagnosis not present

## 2019-02-18 DIAGNOSIS — K760 Fatty (change of) liver, not elsewhere classified: Secondary | ICD-10-CM | POA: Diagnosis not present

## 2019-02-18 DIAGNOSIS — M1990 Unspecified osteoarthritis, unspecified site: Secondary | ICD-10-CM | POA: Diagnosis not present

## 2019-02-18 DIAGNOSIS — E782 Mixed hyperlipidemia: Secondary | ICD-10-CM | POA: Diagnosis not present

## 2019-02-18 DIAGNOSIS — M25562 Pain in left knee: Secondary | ICD-10-CM | POA: Diagnosis not present

## 2019-02-18 DIAGNOSIS — R600 Localized edema: Secondary | ICD-10-CM | POA: Diagnosis not present

## 2019-02-18 DIAGNOSIS — Z602 Problems related to living alone: Secondary | ICD-10-CM | POA: Diagnosis not present

## 2019-02-18 DIAGNOSIS — I10 Essential (primary) hypertension: Secondary | ICD-10-CM | POA: Diagnosis not present

## 2019-02-18 DIAGNOSIS — R270 Ataxia, unspecified: Secondary | ICD-10-CM | POA: Diagnosis not present

## 2019-02-18 DIAGNOSIS — M5442 Lumbago with sciatica, left side: Secondary | ICD-10-CM | POA: Diagnosis not present

## 2019-02-18 DIAGNOSIS — J309 Allergic rhinitis, unspecified: Secondary | ICD-10-CM | POA: Diagnosis not present

## 2019-02-18 DIAGNOSIS — J45909 Unspecified asthma, uncomplicated: Secondary | ICD-10-CM | POA: Diagnosis not present

## 2019-02-18 DIAGNOSIS — Z7901 Long term (current) use of anticoagulants: Secondary | ICD-10-CM | POA: Diagnosis not present

## 2019-02-18 DIAGNOSIS — Z7902 Long term (current) use of antithrombotics/antiplatelets: Secondary | ICD-10-CM | POA: Diagnosis not present

## 2019-02-18 DIAGNOSIS — R413 Other amnesia: Secondary | ICD-10-CM | POA: Diagnosis not present

## 2019-02-18 DIAGNOSIS — E039 Hypothyroidism, unspecified: Secondary | ICD-10-CM | POA: Diagnosis not present

## 2019-02-18 DIAGNOSIS — E559 Vitamin D deficiency, unspecified: Secondary | ICD-10-CM | POA: Diagnosis not present

## 2019-02-18 DIAGNOSIS — I480 Paroxysmal atrial fibrillation: Secondary | ICD-10-CM | POA: Diagnosis not present

## 2019-02-19 DIAGNOSIS — M1612 Unilateral primary osteoarthritis, left hip: Secondary | ICD-10-CM | POA: Diagnosis not present

## 2019-02-19 DIAGNOSIS — M1610 Unilateral primary osteoarthritis, unspecified hip: Secondary | ICD-10-CM | POA: Diagnosis not present

## 2019-02-23 DIAGNOSIS — M5442 Lumbago with sciatica, left side: Secondary | ICD-10-CM | POA: Diagnosis not present

## 2019-02-23 DIAGNOSIS — K760 Fatty (change of) liver, not elsewhere classified: Secondary | ICD-10-CM | POA: Diagnosis not present

## 2019-02-23 DIAGNOSIS — J309 Allergic rhinitis, unspecified: Secondary | ICD-10-CM | POA: Diagnosis not present

## 2019-02-23 DIAGNOSIS — J45909 Unspecified asthma, uncomplicated: Secondary | ICD-10-CM | POA: Diagnosis not present

## 2019-02-23 DIAGNOSIS — Z602 Problems related to living alone: Secondary | ICD-10-CM | POA: Diagnosis not present

## 2019-02-23 DIAGNOSIS — I10 Essential (primary) hypertension: Secondary | ICD-10-CM | POA: Diagnosis not present

## 2019-02-23 DIAGNOSIS — Z7901 Long term (current) use of anticoagulants: Secondary | ICD-10-CM | POA: Diagnosis not present

## 2019-02-23 DIAGNOSIS — R270 Ataxia, unspecified: Secondary | ICD-10-CM | POA: Diagnosis not present

## 2019-02-23 DIAGNOSIS — R600 Localized edema: Secondary | ICD-10-CM | POA: Diagnosis not present

## 2019-02-23 DIAGNOSIS — E039 Hypothyroidism, unspecified: Secondary | ICD-10-CM | POA: Diagnosis not present

## 2019-02-23 DIAGNOSIS — E1142 Type 2 diabetes mellitus with diabetic polyneuropathy: Secondary | ICD-10-CM | POA: Diagnosis not present

## 2019-02-23 DIAGNOSIS — I739 Peripheral vascular disease, unspecified: Secondary | ICD-10-CM | POA: Diagnosis not present

## 2019-02-23 DIAGNOSIS — Z9049 Acquired absence of other specified parts of digestive tract: Secondary | ICD-10-CM | POA: Diagnosis not present

## 2019-02-23 DIAGNOSIS — R413 Other amnesia: Secondary | ICD-10-CM | POA: Diagnosis not present

## 2019-02-23 DIAGNOSIS — I480 Paroxysmal atrial fibrillation: Secondary | ICD-10-CM | POA: Diagnosis not present

## 2019-02-23 DIAGNOSIS — I252 Old myocardial infarction: Secondary | ICD-10-CM | POA: Diagnosis not present

## 2019-02-23 DIAGNOSIS — Z7984 Long term (current) use of oral hypoglycemic drugs: Secondary | ICD-10-CM | POA: Diagnosis not present

## 2019-02-23 DIAGNOSIS — E559 Vitamin D deficiency, unspecified: Secondary | ICD-10-CM | POA: Diagnosis not present

## 2019-02-23 DIAGNOSIS — M25562 Pain in left knee: Secondary | ICD-10-CM | POA: Diagnosis not present

## 2019-02-23 DIAGNOSIS — M1990 Unspecified osteoarthritis, unspecified site: Secondary | ICD-10-CM | POA: Diagnosis not present

## 2019-02-23 DIAGNOSIS — E782 Mixed hyperlipidemia: Secondary | ICD-10-CM | POA: Diagnosis not present

## 2019-02-23 DIAGNOSIS — Z7902 Long term (current) use of antithrombotics/antiplatelets: Secondary | ICD-10-CM | POA: Diagnosis not present

## 2019-02-23 DIAGNOSIS — I2510 Atherosclerotic heart disease of native coronary artery without angina pectoris: Secondary | ICD-10-CM | POA: Diagnosis not present

## 2019-03-01 ENCOUNTER — Other Ambulatory Visit: Payer: Self-pay | Admitting: Neurology

## 2019-03-01 DIAGNOSIS — I10 Essential (primary) hypertension: Secondary | ICD-10-CM | POA: Diagnosis not present

## 2019-03-01 DIAGNOSIS — Z8673 Personal history of transient ischemic attack (TIA), and cerebral infarction without residual deficits: Secondary | ICD-10-CM | POA: Diagnosis not present

## 2019-03-03 DIAGNOSIS — I10 Essential (primary) hypertension: Secondary | ICD-10-CM | POA: Diagnosis not present

## 2019-03-03 DIAGNOSIS — Z602 Problems related to living alone: Secondary | ICD-10-CM | POA: Diagnosis not present

## 2019-03-03 DIAGNOSIS — I739 Peripheral vascular disease, unspecified: Secondary | ICD-10-CM | POA: Diagnosis not present

## 2019-03-03 DIAGNOSIS — E559 Vitamin D deficiency, unspecified: Secondary | ICD-10-CM | POA: Diagnosis not present

## 2019-03-03 DIAGNOSIS — R413 Other amnesia: Secondary | ICD-10-CM | POA: Diagnosis not present

## 2019-03-03 DIAGNOSIS — J45909 Unspecified asthma, uncomplicated: Secondary | ICD-10-CM | POA: Diagnosis not present

## 2019-03-03 DIAGNOSIS — Z9049 Acquired absence of other specified parts of digestive tract: Secondary | ICD-10-CM | POA: Diagnosis not present

## 2019-03-03 DIAGNOSIS — Z7902 Long term (current) use of antithrombotics/antiplatelets: Secondary | ICD-10-CM | POA: Diagnosis not present

## 2019-03-03 DIAGNOSIS — M5442 Lumbago with sciatica, left side: Secondary | ICD-10-CM | POA: Diagnosis not present

## 2019-03-03 DIAGNOSIS — I2510 Atherosclerotic heart disease of native coronary artery without angina pectoris: Secondary | ICD-10-CM | POA: Diagnosis not present

## 2019-03-03 DIAGNOSIS — J309 Allergic rhinitis, unspecified: Secondary | ICD-10-CM | POA: Diagnosis not present

## 2019-03-03 DIAGNOSIS — M25562 Pain in left knee: Secondary | ICD-10-CM | POA: Diagnosis not present

## 2019-03-03 DIAGNOSIS — E1142 Type 2 diabetes mellitus with diabetic polyneuropathy: Secondary | ICD-10-CM | POA: Diagnosis not present

## 2019-03-03 DIAGNOSIS — R600 Localized edema: Secondary | ICD-10-CM | POA: Diagnosis not present

## 2019-03-03 DIAGNOSIS — E782 Mixed hyperlipidemia: Secondary | ICD-10-CM | POA: Diagnosis not present

## 2019-03-03 DIAGNOSIS — M1990 Unspecified osteoarthritis, unspecified site: Secondary | ICD-10-CM | POA: Diagnosis not present

## 2019-03-03 DIAGNOSIS — Z7901 Long term (current) use of anticoagulants: Secondary | ICD-10-CM | POA: Diagnosis not present

## 2019-03-03 DIAGNOSIS — R270 Ataxia, unspecified: Secondary | ICD-10-CM | POA: Diagnosis not present

## 2019-03-03 DIAGNOSIS — E039 Hypothyroidism, unspecified: Secondary | ICD-10-CM | POA: Diagnosis not present

## 2019-03-03 DIAGNOSIS — I252 Old myocardial infarction: Secondary | ICD-10-CM | POA: Diagnosis not present

## 2019-03-03 DIAGNOSIS — I480 Paroxysmal atrial fibrillation: Secondary | ICD-10-CM | POA: Diagnosis not present

## 2019-03-03 DIAGNOSIS — K760 Fatty (change of) liver, not elsewhere classified: Secondary | ICD-10-CM | POA: Diagnosis not present

## 2019-03-03 DIAGNOSIS — Z7984 Long term (current) use of oral hypoglycemic drugs: Secondary | ICD-10-CM | POA: Diagnosis not present

## 2019-03-08 DIAGNOSIS — I739 Peripheral vascular disease, unspecified: Secondary | ICD-10-CM | POA: Diagnosis not present

## 2019-03-08 DIAGNOSIS — L603 Nail dystrophy: Secondary | ICD-10-CM | POA: Insufficient documentation

## 2019-03-08 DIAGNOSIS — E1140 Type 2 diabetes mellitus with diabetic neuropathy, unspecified: Secondary | ICD-10-CM | POA: Diagnosis not present

## 2019-03-08 DIAGNOSIS — Z7984 Long term (current) use of oral hypoglycemic drugs: Secondary | ICD-10-CM | POA: Diagnosis not present

## 2019-03-08 HISTORY — DX: Nail dystrophy: L60.3

## 2019-03-11 DIAGNOSIS — E559 Vitamin D deficiency, unspecified: Secondary | ICD-10-CM | POA: Diagnosis not present

## 2019-03-11 DIAGNOSIS — E782 Mixed hyperlipidemia: Secondary | ICD-10-CM | POA: Diagnosis not present

## 2019-03-11 DIAGNOSIS — E1140 Type 2 diabetes mellitus with diabetic neuropathy, unspecified: Secondary | ICD-10-CM | POA: Diagnosis not present

## 2019-03-11 DIAGNOSIS — I10 Essential (primary) hypertension: Secondary | ICD-10-CM | POA: Diagnosis not present

## 2019-03-11 DIAGNOSIS — E039 Hypothyroidism, unspecified: Secondary | ICD-10-CM | POA: Diagnosis not present

## 2019-03-12 DIAGNOSIS — E782 Mixed hyperlipidemia: Secondary | ICD-10-CM | POA: Diagnosis not present

## 2019-03-12 DIAGNOSIS — Z79899 Other long term (current) drug therapy: Secondary | ICD-10-CM | POA: Diagnosis not present

## 2019-03-12 DIAGNOSIS — E559 Vitamin D deficiency, unspecified: Secondary | ICD-10-CM | POA: Diagnosis not present

## 2019-03-12 DIAGNOSIS — E039 Hypothyroidism, unspecified: Secondary | ICD-10-CM | POA: Diagnosis not present

## 2019-03-12 DIAGNOSIS — S93491A Sprain of other ligament of right ankle, initial encounter: Secondary | ICD-10-CM | POA: Diagnosis not present

## 2019-03-12 DIAGNOSIS — E1140 Type 2 diabetes mellitus with diabetic neuropathy, unspecified: Secondary | ICD-10-CM | POA: Diagnosis not present

## 2019-03-19 DIAGNOSIS — E119 Type 2 diabetes mellitus without complications: Secondary | ICD-10-CM | POA: Diagnosis not present

## 2019-03-19 DIAGNOSIS — S91302A Unspecified open wound, left foot, initial encounter: Secondary | ICD-10-CM | POA: Diagnosis not present

## 2019-03-19 DIAGNOSIS — R413 Other amnesia: Secondary | ICD-10-CM | POA: Diagnosis not present

## 2019-03-19 DIAGNOSIS — I480 Paroxysmal atrial fibrillation: Secondary | ICD-10-CM | POA: Diagnosis not present

## 2019-03-19 DIAGNOSIS — R600 Localized edema: Secondary | ICD-10-CM | POA: Diagnosis not present

## 2019-03-30 ENCOUNTER — Other Ambulatory Visit: Payer: Self-pay

## 2019-03-30 NOTE — Patient Outreach (Signed)
[facility]) Care Management  03/30/2019  Elaine Mathews [birth year] [id number]   Medication Adherence call to Elaine Mathews [employer] spoke with patients aid,patient is past due on Atorvastatin 40 mg and Lisinopril 40 mg,patient aid said patient receives a pill pack every month ,patient has medication at this time. Elaine Mathews is showing past due under [facility] Ins.  Lillia Abed CPhT Pharmacy Technician [facility] Care Management Direct Dial [phone]  Fax [phone] [email or web address]

## 2019-04-19 DIAGNOSIS — E782 Mixed hyperlipidemia: Secondary | ICD-10-CM | POA: Diagnosis not present

## 2019-04-19 DIAGNOSIS — E119 Type 2 diabetes mellitus without complications: Secondary | ICD-10-CM | POA: Diagnosis not present

## 2019-04-19 DIAGNOSIS — I480 Paroxysmal atrial fibrillation: Secondary | ICD-10-CM | POA: Diagnosis not present

## 2019-04-19 DIAGNOSIS — E039 Hypothyroidism, unspecified: Secondary | ICD-10-CM | POA: Diagnosis not present

## 2019-04-19 DIAGNOSIS — I10 Essential (primary) hypertension: Secondary | ICD-10-CM | POA: Diagnosis not present

## 2019-04-23 DIAGNOSIS — M545 Low back pain: Secondary | ICD-10-CM | POA: Diagnosis not present

## 2019-04-23 DIAGNOSIS — M1610 Unilateral primary osteoarthritis, unspecified hip: Secondary | ICD-10-CM | POA: Diagnosis not present

## 2019-05-05 ENCOUNTER — Ambulatory Visit: Payer: Medicare Other | Admitting: Neurology

## 2019-06-21 ENCOUNTER — Ambulatory Visit: Payer: Medicare Other | Admitting: Neurology

## 2019-06-23 ENCOUNTER — Telehealth: Payer: Self-pay | Admitting: Cardiology

## 2019-06-23 NOTE — Telephone Encounter (Signed)
Elaine Mathews will accompany the pt as she has dementia.

## 2019-06-23 NOTE — Telephone Encounter (Signed)
Pt will need approval from the office for her caregiver to come with her to her upcoming appointment. The patient has memory issues and can not be at the appointment on her own.

## 2019-06-25 ENCOUNTER — Other Ambulatory Visit: Payer: Self-pay

## 2019-06-28 ENCOUNTER — Other Ambulatory Visit: Payer: Self-pay

## 2019-06-28 ENCOUNTER — Encounter: Payer: Self-pay | Admitting: Cardiology

## 2019-06-28 ENCOUNTER — Ambulatory Visit (INDEPENDENT_AMBULATORY_CARE_PROVIDER_SITE_OTHER): Payer: Medicare Other | Admitting: Cardiology

## 2019-06-28 VITALS — BP 150/70 | HR 89 | Ht 64.0 in | Wt 129.0 lb

## 2019-06-28 DIAGNOSIS — I480 Paroxysmal atrial fibrillation: Secondary | ICD-10-CM

## 2019-06-28 DIAGNOSIS — E782 Mixed hyperlipidemia: Secondary | ICD-10-CM

## 2019-06-28 DIAGNOSIS — I10 Essential (primary) hypertension: Secondary | ICD-10-CM

## 2019-06-28 DIAGNOSIS — I2510 Atherosclerotic heart disease of native coronary artery without angina pectoris: Secondary | ICD-10-CM | POA: Diagnosis not present

## 2019-06-28 NOTE — Progress Notes (Signed)
Cardiology Office Note:    Date:  06/28/2019   ID:  Elaine Mathews, DOB 1933/05/14, MRN [id number]  PCP:  Nicholos Johns, MD  Cardiologist:  Jenean Lindau, MD   Referring MD: Nicholos Johns, MD    ASSESSMENT:    1. Coronary artery disease involving native coronary artery of native heart without angina pectoris   2. Essential hypertension   3. Mixed hyperlipidemia   4. Paroxysmal atrial fibrillation (HCC)    PLAN:    In order of problems listed above:  1. Coronary artery disease: Secondary prevention stressed with the patient.  Importance of compliance with diet and medication stressed and she vocalized understanding.  Her blood pressure is stable. 2. Essential hypertension: Blood pressure stable.  Her daughter has brought multiple blood pressure recordings from home and they are fine. 3. Paroxysmal atrial fibrillation: I discussed with the patient atrial fibrillation, disease process. Management and therapy including rate and rhythm control, anticoagulation benefits and potential risks were discussed extensively with the patient. Patient had multiple questions which were answered to patient's satisfaction. 4. Mixed dyslipidemia: Patient had blood work at her primary care physician and we are in the process of obtaining a report 5. Diabetes mellitus: Blood sugars are elevated diet was emphasized and this is managed by primary care physician.  Importance of regular activity and ambulation stressed 6. Patient will be seen in follow-up appointment in 6 months or earlier if the patient has any concerns    Medication Adjustments/Labs and Tests Ordered: Current medicines are reviewed at length with the patient today.  Concerns regarding medicines are outlined above.  No orders of the defined types were placed in this encounter.  No orders of the defined types were placed in this encounter.    Chief Complaint  Patient presents with  . Follow-up     History of Present Illness:     Elaine Mathews is a 86 y.o. female.  Patient has past medical history of coronary artery disease, essential hypertension dyslipidemia and paroxysmal atrial fibrillation.  Patient denies any problems at this time and takes care of activities of daily living.  No chest pain orthopnea or palpitations or PND.  At the time of my evaluation, the patient is alert awake oriented and in no distress.  Past Medical History:  Diagnosis Date  . Allergic rhinosinusitis   . Arthritis   . Ataxia    With generalized weakness in legs  . CAD (coronary artery disease)   . Cataract, bilateral   . Controlled type 2 diabetes with neuropathy ([place]) 01/06/2017  . Elevated LFTs 07/12/2018  . Elevated liver enzymes   . Essential hypertension 11/13/2017  . Fatty liver   . Foot lesion   . Hyperlipidemia   . Hypothyroidism   . Leg pain, bilateral   . Mild asthma   . Mild vascular neurocognitive disorder ([place]) 11/13/2018  . Nail dystrophy 03/08/2019  . NSTEMI (non-ST elevated myocardial infarction) ([place]) 07/12/2018  . Paroxysmal atrial fibrillation ([place]) 11/13/2017  . Pre-ulcerative calluses 01/06/2017  . PVD (peripheral vascular disease) ([place])   . Seizure ([place])   . Sinus pause 07/13/2018  . Spleen hematoma without rupture of capsule, without open wound into cavity 07/15/2018  . Stroke [facility])    Right parietal  . Syncope, vasovagal 07/15/2018  . Vitamin D deficiency     Past Surgical History:  Procedure Laterality Date  . APPENDECTOMY    . CATARACT EXTRACTION Bilateral   . CHOLECYSTECTOMY    . CORONARY  STENT INTERVENTION N/A 07/13/2018   Procedure: CORONARY STENT INTERVENTION;  Surgeon: Yvonne Kendall, MD;  Location: [facility] INVASIVE CV LAB;  Service: Cardiovascular;  Laterality: N/A;  . INTRAVASCULAR PRESSURE WIRE/FFR STUDY N/A 07/13/2018   Procedure: INTRAVASCULAR PRESSURE WIRE/FFR STUDY;  Surgeon: Yvonne Kendall, MD;  Location: [facility] INVASIVE CV LAB;  Service: Cardiovascular;  Laterality: N/A;  . LEFT HEART CATH AND  CORONARY ANGIOGRAPHY N/A 07/13/2018   Procedure: LEFT HEART CATH AND CORONARY ANGIOGRAPHY;  Surgeon: Yvonne Kendall, MD;  Location: [facility] INVASIVE CV LAB;  Service: Cardiovascular;  Laterality: N/A;  . TONSILLECTOMY      Current Medications: Current Meds  Medication Sig  . acetaminophen (TYLENOL) 500 MG tablet Take 500 mg by mouth in the morning and at bedtime.  Marland Kitchen atorvastatin (LIPITOR) 40 MG tablet Take 1 tablet (40 mg total) by mouth daily at 6 PM.  . clopidogrel (PLAVIX) 75 MG tablet Take 1 tablet (75 mg total) by mouth daily with breakfast.  . diltiazem (CARDIZEM CD) 180 MG 24 hr capsule Take 180 mg by mouth daily.  Marland Kitchen ezetimibe (ZETIA) 10 MG tablet Take 10 mg by mouth at bedtime.   . famotidine (PEPCID) 40 MG tablet Take 40 mg by mouth daily.  . fluticasone (FLONASE) 50 MCG/ACT nasal spray Place 2 sprays into both nostrils daily.  . furosemide (LASIX) 20 MG tablet Take 20 mg by mouth daily.  . Glucosamine-Chondroit-Vit C-Mn (GLUCOSAMINE 1500 COMPLEX PO) Take 1 capsule by mouth daily at 12 noon.   Marland Kitchen glucose blood test strip 1 each by Other route as needed. Use as instructed  . Lactobacillus (ACIDOPHILUS PO) Take 175 mg by mouth daily.  Marland Kitchen levETIRAcetam (KEPPRA) 250 MG tablet TAKE 1 TABLET BY MOUTH EVERY MORNING ANDTAKE 2 TABLETS EVERY EVENING  . levothyroxine (SYNTHROID, LEVOTHROID) 75 MCG tablet Take 75 mcg by mouth as directed. Take 1.5 tablets daily Mon-Fri and 1 tablet on Sat and Sun  . lisinopril (ZESTRIL) 40 MG tablet Take 1 tablet by mouth daily.  . memantine (NAMENDA) 10 MG tablet 10 mg 2 (two) times daily.   . metFORMIN (GLUCOPHAGE) 500 MG tablet Take 500 mg by mouth daily.  . montelukast (SINGULAIR) 10 MG tablet Take 10 mg by mouth at bedtime.  . nitroGLYCERIN (NITROSTAT) 0.4 MG SL tablet Place 0.4 mg under the tongue every 5 (five) minutes as needed for chest pain.   . potassium chloride (KLOR-CON) 10 MEQ tablet Take 10 mEq by mouth daily.  . Prenatal Vit-Fe Fumarate-FA  (MULTIVITAMIN-PRENATAL) 27-0.8 MG TABS tablet Take 1 tablet by mouth daily at 12 noon.  . rivaroxaban (XARELTO) 20 MG TABS tablet Take 20 mg by mouth daily with supper.  . sodium chloride 1 g tablet Take 1 g by mouth once.  . [DISCONTINUED] ergocalciferol (VITAMIN D2) 50000 units capsule Take 50,000 Units by mouth every Wednesday.      Allergies:   Codeine and Penicillins   Social History   Socioeconomic History  . Marital status: Divorced    Spouse name: Not on file  . Number of children: Not on file  . Years of education: Not on file  . Highest education level: Not on file  Occupational History  . Not on file  Tobacco Use  . Smoking status: Former Smoker    Types: Cigarettes  . Smokeless tobacco: Never Used  Substance and Sexual Activity  . Alcohol use: No  . Drug use: No  . Sexual activity: Not Currently    Partners: Male  Other Topics Concern  .  Not on file  Social History Narrative   Lives with      Highest level of edu-      Right handed   Social Determinants of Health   Financial Resource Strain:   . Difficulty of Paying Living Expenses:   Food Insecurity:   . Worried About Programme researcher, broadcasting/film/video in the Last Year:   . Barista in the Last Year:   Transportation Needs:   . Freight forwarder (Medical):   Marland Kitchen Lack of Transportation (Non-Medical):   Physical Activity:   . Days of Exercise per Week:   . Minutes of Exercise per Session:   Stress:   . Feeling of Stress :   Social Connections:   . Frequency of Communication with Friends and Family:   . Frequency of Social Gatherings with Friends and Family:   . Attends Religious Services:   . Active Member of Clubs or Organizations:   . Attends Banker Meetings:   Marland Kitchen Marital Status:      Family History: The patient's family history includes COPD in her son; Heart attack in her mother and son; Hypercholesterolemia in her mother; Hypertension in her father and mother; Stroke in her father  and mother.  ROS:   Please see the history of present illness.    All other systems reviewed and are negative.  EKGs/Labs/Other Studies Reviewed:    The following studies were reviewed today: I discussed my findings with the patient at length   Recent Labs: 07/13/2018: ALT 38; TSH 3.049 07/14/2018: Magnesium 2.5 07/16/2018: BUN 10; Creatinine, Ser 0.74; Potassium 4.2; Sodium 133 07/17/2018: Hemoglobin 9.9; Platelets 151  Recent Lipid Panel    Component Value Date/Time   CHOL 197 07/13/2018 0234   TRIG 116 07/13/2018 0234   HDL 53 07/13/2018 0234   CHOLHDL 3.7 07/13/2018 0234   VLDL 23 07/13/2018 0234   LDLCALC 121 (H) 07/13/2018 0234    Physical Exam:    VS:  BP (!) 150/70 (BP Location: Left Arm, Patient Position: Sitting, Cuff Size: Normal)   Pulse 89   Ht 5\' 4"  (1.626 m)   Wt 129 lb (58.5 kg)   SpO2 95%   BMI 22.14 kg/m     Wt Readings from Last 3 Encounters:  06/28/19 129 lb (58.5 kg)  12/16/18 126 lb 12.8 oz (57.5 kg)  10/26/18 122 lb (55.3 kg)     GEN: Patient is in no acute distress HEENT: Normal NECK: No JVD; No carotid bruits LYMPHATICS: No lymphadenopathy CARDIAC: Hear sounds regular, 2/6 systolic murmur at the apex. RESPIRATORY:  Clear to auscultation without rales, wheezing or rhonchi  ABDOMEN: Soft, non-tender, non-distended MUSCULOSKELETAL:  No edema; No deformity  SKIN: Warm and dry NEUROLOGIC:  Alert and oriented x 3 PSYCHIATRIC:  Normal affect   Signed, 10/28/18, MD  06/28/2019 11:07 AM     [facility]

## 2019-06-28 NOTE — Patient Instructions (Signed)

## 2019-07-21 ENCOUNTER — Other Ambulatory Visit: Payer: Self-pay | Admitting: Neurology

## 2019-07-21 DIAGNOSIS — E1140 Type 2 diabetes mellitus with diabetic neuropathy, unspecified: Secondary | ICD-10-CM | POA: Diagnosis not present

## 2019-07-21 DIAGNOSIS — E039 Hypothyroidism, unspecified: Secondary | ICD-10-CM | POA: Diagnosis not present

## 2019-07-21 DIAGNOSIS — I10 Essential (primary) hypertension: Secondary | ICD-10-CM | POA: Diagnosis not present

## 2019-07-21 DIAGNOSIS — E782 Mixed hyperlipidemia: Secondary | ICD-10-CM | POA: Diagnosis not present

## 2019-07-21 DIAGNOSIS — R600 Localized edema: Secondary | ICD-10-CM | POA: Diagnosis not present

## 2019-07-23 DIAGNOSIS — Z79899 Other long term (current) drug therapy: Secondary | ICD-10-CM | POA: Diagnosis not present

## 2019-07-23 DIAGNOSIS — E1140 Type 2 diabetes mellitus with diabetic neuropathy, unspecified: Secondary | ICD-10-CM | POA: Diagnosis not present

## 2019-07-23 DIAGNOSIS — E782 Mixed hyperlipidemia: Secondary | ICD-10-CM | POA: Diagnosis not present

## 2019-07-23 DIAGNOSIS — E559 Vitamin D deficiency, unspecified: Secondary | ICD-10-CM | POA: Diagnosis not present

## 2019-07-23 DIAGNOSIS — E039 Hypothyroidism, unspecified: Secondary | ICD-10-CM | POA: Diagnosis not present

## 2019-07-26 ENCOUNTER — Other Ambulatory Visit: Payer: Self-pay | Admitting: Neurology

## 2019-09-27 ENCOUNTER — Telehealth: Payer: Self-pay | Admitting: Cardiology

## 2019-09-27 NOTE — Telephone Encounter (Signed)
Pt's son concerned that the pt's pill pack has her taking her medications at different times of the day rather than what the doctor ordered. Pt advised to call the insurance to speak with them and notify us of any pharmacy updates as we will be glad to assist him with new RX. Son verbalized understanding and had no additional questions.

## 2019-09-27 NOTE — Telephone Encounter (Signed)
Pt c/o medication issue:  1. Name of Medication: rivaroxaban (XARELTO) 20 MG TABS tablet  2. How are you currently taking this medication (dosage and times per day)? 1 tablet by mouth daily at supper   3. Are you having a reaction (difficulty breathing--STAT)? No   4. What is your medication issue? [name] is calling wanting to know if she should only take this medication at night time due to realizing the care givers had her taking this as a AM medication. He states he is trying to get to all her medications sorted out and knew she was taking it at supper previously. Please advise.

## 2019-09-28 ENCOUNTER — Other Ambulatory Visit: Payer: Self-pay

## 2019-09-28 ENCOUNTER — Telehealth: Payer: Self-pay | Admitting: Cardiology

## 2019-09-28 MED ORDER — LISINOPRIL 40 MG PO TABS: 40.0000 mg | ORAL_TABLET | Freq: Every day | ORAL | 3 refills | Status: DC

## 2019-09-28 MED ORDER — ATORVASTATIN CALCIUM 40 MG PO TABS: 40.0000 mg | ORAL_TABLET | Freq: Every day | ORAL | 3 refills | Status: DC

## 2019-09-28 MED ORDER — CLOPIDOGREL BISULFATE 75 MG PO TABS: 75.0000 mg | ORAL_TABLET | Freq: Every day | ORAL | 3 refills | Status: DC

## 2019-09-28 MED ORDER — FUROSEMIDE 20 MG PO TABS: 20.0000 mg | ORAL_TABLET | Freq: Every day | ORAL | 3 refills | Status: DC

## 2019-09-28 MED ORDER — NITROGLYCERIN 0.4 MG SL SUBL: 0.4000 mg | SUBLINGUAL_TABLET | SUBLINGUAL | 6 refills | Status: DC | PRN

## 2019-09-28 MED ORDER — EZETIMIBE 10 MG PO TABS: 10.0000 mg | ORAL_TABLET | Freq: Every day | ORAL | 3 refills | Status: DC

## 2019-09-28 MED ORDER — DILTIAZEM HCL ER COATED BEADS 180 MG PO CP24: 180.0000 mg | ORAL_CAPSULE | Freq: Every day | ORAL | 3 refills | Status: DC

## 2019-09-28 MED ORDER — RIVAROXABAN 20 MG PO TABS: 20.0000 mg | ORAL_TABLET | Freq: Every day | ORAL | 3 refills | Status: DC

## 2019-09-28 MED ORDER — POTASSIUM CHLORIDE ER 10 MEQ PO TBCR: 10.0000 meq | EXTENDED_RELEASE_TABLET | Freq: Every day | ORAL | 3 refills | Status: DC

## 2019-09-28 NOTE — Telephone Encounter (Signed)
Patient's son, [name] is calling regarding the medications the patient has been prescribed by Dr. Tomie China. He states that they have switched pharmacies from [facility] in [place] to [facility] in [place] due to [facility] not having the correct meds in the correct pill packs.  [name] is requesting all of the medications that Dr. Tomie China has prescribed, to be called in to [facility] at [phone] with how the patient is supposed to take them.   Please advise.

## 2019-09-28 NOTE — Telephone Encounter (Signed)
Patient's son is calling to follow up in regards to pill pack. He states he would like to [place] and will discuss in detail when he speaks with her.

## 2019-09-28 NOTE — Telephone Encounter (Signed)
RX has been faxed to [facility] for 90 days. [name] has been made aware.

## 2019-10-12 DIAGNOSIS — Z79899 Other long term (current) drug therapy: Secondary | ICD-10-CM | POA: Diagnosis not present

## 2019-10-12 DIAGNOSIS — R600 Localized edema: Secondary | ICD-10-CM | POA: Diagnosis not present

## 2019-10-12 DIAGNOSIS — I10 Essential (primary) hypertension: Secondary | ICD-10-CM | POA: Diagnosis not present

## 2019-10-12 DIAGNOSIS — E782 Mixed hyperlipidemia: Secondary | ICD-10-CM | POA: Diagnosis not present

## 2019-10-12 DIAGNOSIS — E1140 Type 2 diabetes mellitus with diabetic neuropathy, unspecified: Secondary | ICD-10-CM | POA: Diagnosis not present

## 2019-10-12 DIAGNOSIS — E039 Hypothyroidism, unspecified: Secondary | ICD-10-CM | POA: Diagnosis not present

## 2019-10-25 DIAGNOSIS — M7989 Other specified soft tissue disorders: Secondary | ICD-10-CM | POA: Diagnosis not present

## 2019-10-25 DIAGNOSIS — R600 Localized edema: Secondary | ICD-10-CM | POA: Diagnosis not present

## 2019-10-25 DIAGNOSIS — L03116 Cellulitis of left lower limb: Secondary | ICD-10-CM | POA: Diagnosis not present

## 2019-10-25 DIAGNOSIS — M79606 Pain in leg, unspecified: Secondary | ICD-10-CM | POA: Diagnosis not present

## 2019-10-26 DIAGNOSIS — M79604 Pain in right leg: Secondary | ICD-10-CM | POA: Diagnosis not present

## 2019-10-26 DIAGNOSIS — M79605 Pain in left leg: Secondary | ICD-10-CM | POA: Diagnosis not present

## 2019-10-26 DIAGNOSIS — R600 Localized edema: Secondary | ICD-10-CM | POA: Diagnosis not present

## 2019-10-26 DIAGNOSIS — M79606 Pain in leg, unspecified: Secondary | ICD-10-CM | POA: Diagnosis not present

## 2019-10-28 ENCOUNTER — Observation Stay (HOSPITAL_COMMUNITY): Payer: Medicare Other

## 2019-10-28 ENCOUNTER — Other Ambulatory Visit: Payer: Self-pay

## 2019-10-28 ENCOUNTER — Emergency Department (HOSPITAL_COMMUNITY): Payer: Medicare Other

## 2019-10-28 ENCOUNTER — Observation Stay (HOSPITAL_COMMUNITY)
Admission: EM | Admit: 2019-10-28 | Discharge: 2019-10-30 | Disposition: A | Discharge status: Home / Self Care | Payer: Medicare Other | Attending: Family Medicine | Admitting: Family Medicine

## 2019-10-28 ENCOUNTER — Encounter (HOSPITAL_COMMUNITY): Payer: Self-pay

## 2019-10-28 DIAGNOSIS — Z9861 Coronary angioplasty status: Secondary | ICD-10-CM | POA: Insufficient documentation

## 2019-10-28 DIAGNOSIS — Z8673 Personal history of transient ischemic attack (TIA), and cerebral infarction without residual deficits: Secondary | ICD-10-CM

## 2019-10-28 DIAGNOSIS — E1169 Type 2 diabetes mellitus with other specified complication: Secondary | ICD-10-CM | POA: Diagnosis present

## 2019-10-28 DIAGNOSIS — S199XXA Unspecified injury of neck, initial encounter: Secondary | ICD-10-CM | POA: Diagnosis not present

## 2019-10-28 DIAGNOSIS — M546 Pain in thoracic spine: Secondary | ICD-10-CM | POA: Diagnosis not present

## 2019-10-28 DIAGNOSIS — J45909 Unspecified asthma, uncomplicated: Secondary | ICD-10-CM | POA: Insufficient documentation

## 2019-10-28 DIAGNOSIS — R404 Transient alteration of awareness: Secondary | ICD-10-CM | POA: Diagnosis not present

## 2019-10-28 DIAGNOSIS — I10 Essential (primary) hypertension: Secondary | ICD-10-CM

## 2019-10-28 DIAGNOSIS — R197 Diarrhea, unspecified: Secondary | ICD-10-CM | POA: Diagnosis not present

## 2019-10-28 DIAGNOSIS — Y92002 Bathroom of unspecified non-institutional (private) residence single-family (private) house as the place of occurrence of the external cause: Secondary | ICD-10-CM | POA: Diagnosis not present

## 2019-10-28 DIAGNOSIS — Y93E1 Activity, personal bathing and showering: Secondary | ICD-10-CM | POA: Diagnosis not present

## 2019-10-28 DIAGNOSIS — E785 Hyperlipidemia, unspecified: Secondary | ICD-10-CM

## 2019-10-28 DIAGNOSIS — J321 Chronic frontal sinusitis: Secondary | ICD-10-CM | POA: Diagnosis not present

## 2019-10-28 DIAGNOSIS — R0902 Hypoxemia: Secondary | ICD-10-CM | POA: Diagnosis not present

## 2019-10-28 DIAGNOSIS — K760 Fatty (change of) liver, not elsewhere classified: Secondary | ICD-10-CM | POA: Diagnosis present

## 2019-10-28 DIAGNOSIS — I2510 Atherosclerotic heart disease of native coronary artery without angina pectoris: Secondary | ICD-10-CM | POA: Diagnosis not present

## 2019-10-28 DIAGNOSIS — I5032 Chronic diastolic (congestive) heart failure: Secondary | ICD-10-CM | POA: Diagnosis not present

## 2019-10-28 DIAGNOSIS — Z20822 Contact with and (suspected) exposure to covid-19: Secondary | ICD-10-CM | POA: Diagnosis not present

## 2019-10-28 DIAGNOSIS — R58 Hemorrhage, not elsewhere classified: Secondary | ICD-10-CM | POA: Diagnosis not present

## 2019-10-28 DIAGNOSIS — R9431 Abnormal electrocardiogram [ECG] [EKG]: Secondary | ICD-10-CM | POA: Diagnosis present

## 2019-10-28 DIAGNOSIS — Z79899 Other long term (current) drug therapy: Secondary | ICD-10-CM | POA: Diagnosis not present

## 2019-10-28 DIAGNOSIS — F0390 Unspecified dementia without behavioral disturbance: Secondary | ICD-10-CM | POA: Diagnosis not present

## 2019-10-28 DIAGNOSIS — S299XXA Unspecified injury of thorax, initial encounter: Secondary | ICD-10-CM | POA: Diagnosis not present

## 2019-10-28 DIAGNOSIS — M25562 Pain in left knee: Secondary | ICD-10-CM | POA: Diagnosis not present

## 2019-10-28 DIAGNOSIS — W19XXXA Unspecified fall, initial encounter: Secondary | ICD-10-CM

## 2019-10-28 DIAGNOSIS — Z743 Need for continuous supervision: Secondary | ICD-10-CM | POA: Diagnosis not present

## 2019-10-28 DIAGNOSIS — M79646 Pain in unspecified finger(s): Secondary | ICD-10-CM | POA: Diagnosis not present

## 2019-10-28 DIAGNOSIS — I480 Paroxysmal atrial fibrillation: Secondary | ICD-10-CM | POA: Diagnosis not present

## 2019-10-28 DIAGNOSIS — S0990XA Unspecified injury of head, initial encounter: Secondary | ICD-10-CM | POA: Insufficient documentation

## 2019-10-28 DIAGNOSIS — W01198A Fall on same level from slipping, tripping and stumbling with subsequent striking against other object, initial encounter: Secondary | ICD-10-CM | POA: Diagnosis not present

## 2019-10-28 DIAGNOSIS — I509 Heart failure, unspecified: Secondary | ICD-10-CM | POA: Insufficient documentation

## 2019-10-28 DIAGNOSIS — G40909 Epilepsy, unspecified, not intractable, without status epilepticus: Secondary | ICD-10-CM

## 2019-10-28 DIAGNOSIS — S80212A Abrasion, left knee, initial encounter: Secondary | ICD-10-CM | POA: Diagnosis present

## 2019-10-28 DIAGNOSIS — E119 Type 2 diabetes mellitus without complications: Secondary | ICD-10-CM | POA: Diagnosis not present

## 2019-10-28 DIAGNOSIS — E039 Hypothyroidism, unspecified: Secondary | ICD-10-CM | POA: Diagnosis present

## 2019-10-28 DIAGNOSIS — J3489 Other specified disorders of nose and nasal sinuses: Secondary | ICD-10-CM | POA: Diagnosis not present

## 2019-10-28 DIAGNOSIS — R55 Syncope and collapse: Secondary | ICD-10-CM | POA: Diagnosis not present

## 2019-10-28 DIAGNOSIS — I110 Hypertensive heart disease with heart failure: Secondary | ICD-10-CM | POA: Diagnosis not present

## 2019-10-28 DIAGNOSIS — M47816 Spondylosis without myelopathy or radiculopathy, lumbar region: Secondary | ICD-10-CM | POA: Diagnosis not present

## 2019-10-28 HISTORY — DX: Abnormal electrocardiogram (ECG) (EKG): R94.31

## 2019-10-28 HISTORY — DX: Abrasion, left knee, initial encounter: S80.212A

## 2019-10-28 HISTORY — DX: Essential (primary) hypertension: I10

## 2019-10-28 HISTORY — DX: Acute myocardial infarction, unspecified: I21.9

## 2019-10-28 HISTORY — DX: Epilepsy, unspecified, not intractable, without status epilepticus: G40.909

## 2019-10-28 HISTORY — DX: Hyperlipidemia, unspecified: E78.5

## 2019-10-28 HISTORY — DX: Chronic diastolic (congestive) heart failure: I50.32

## 2019-10-28 HISTORY — DX: Atherosclerotic heart disease of native coronary artery without angina pectoris: I25.10

## 2019-10-28 HISTORY — DX: Cardiac arrhythmia, unspecified: I49.9

## 2019-10-28 HISTORY — DX: Unspecified atrial fibrillation: I48.91

## 2019-10-28 HISTORY — DX: Type 2 diabetes mellitus without complications: E11.9

## 2019-10-28 HISTORY — DX: Peripheral vascular disease, unspecified: I73.9

## 2019-10-28 HISTORY — DX: Paroxysmal atrial fibrillation: I48.0

## 2019-10-28 HISTORY — DX: Diarrhea, unspecified: R19.7

## 2019-10-28 HISTORY — DX: Heart failure, unspecified: I50.9

## 2019-10-28 HISTORY — DX: Type 2 diabetes mellitus with other specified complication: E11.69

## 2019-10-28 HISTORY — DX: Unspecified asthma, uncomplicated: J45.909

## 2019-10-28 HISTORY — DX: Disorder of thyroid, unspecified: E07.9

## 2019-10-28 HISTORY — DX: Syncope and collapse: R55

## 2019-10-28 HISTORY — DX: Myoneural disorder, unspecified: G70.9

## 2019-10-28 HISTORY — DX: Personal history of transient ischemic attack (TIA), and cerebral infarction without residual deficits: Z86.73

## 2019-10-28 HISTORY — DX: Unspecified dementia, unspecified severity, without behavioral disturbance, psychotic disturbance, mood disturbance, and anxiety: F03.90

## 2019-10-28 LAB — PHOSPHORUS: Phosphorus: 4 mg/dL (ref 2.5–4.6)

## 2019-10-28 LAB — COMPREHENSIVE METABOLIC PANEL
ALT: 29 U/L (ref 0–44)
AST: 50 U/L — ABNORMAL HIGH (ref 15–41)
Albumin: 4.1 g/dL (ref 3.5–5.0)
Alkaline Phosphatase: 63 U/L (ref 38–126)
Anion gap: 13 (ref 5–15)
BUN: 16 mg/dL (ref 8–23)
CO2: 22 mmol/L (ref 22–32)
Calcium: 9.3 mg/dL (ref 8.9–10.3)
Chloride: 103 mmol/L (ref 98–111)
Creatinine, Ser: 0.79 mg/dL (ref 0.44–1.00)
GFR calc Af Amer: >60 mL/min (ref >60)
GFR calc non Af Amer: >60 mL/min (ref >60)
Glucose, Bld: 184 mg/dL — ABNORMAL HIGH (ref 70–99)
Potassium: 3.6 mmol/L (ref 3.5–5.1)
Sodium: 138 mmol/L (ref 135–145)
Total Bilirubin: 0.7 mg/dL (ref 0.3–1.2)
Total Protein: 7.2 g/dL (ref 6.5–8.1)

## 2019-10-28 LAB — I-STAT CHEM 8, ED
BUN: 23 mg/dL (ref 8–23)
Calcium, Ion: 1.06 mmol/L — ABNORMAL LOW (ref 1.15–1.40)
Chloride: 100 mmol/L (ref 98–111)
Creatinine, Ser: 0.7 mg/dL (ref 0.44–1.00)
Glucose, Bld: 183 mg/dL — ABNORMAL HIGH (ref 70–99)
HCT: 33 % — ABNORMAL LOW (ref 36.0–46.0)
Hemoglobin: 11.2 g/dL — ABNORMAL LOW (ref 12.0–15.0)
Potassium: 3.8 mmol/L (ref 3.5–5.1)
Sodium: 137 mmol/L (ref 135–145)
TCO2: 24 mmol/L (ref 22–32)

## 2019-10-28 LAB — SAMPLE TO BLOOD BANK

## 2019-10-28 LAB — CK: Total CK: 165 U/L (ref 38–234)

## 2019-10-28 LAB — CBC
HCT: 35.4 % — ABNORMAL LOW (ref 36.0–46.0)
Hemoglobin: 11.3 g/dL — ABNORMAL LOW (ref 12.0–15.0)
MCH: 29.5 pg (ref 26.0–34.0)
MCHC: 31.9 g/dL (ref 30.0–36.0)
MCV: 92.4 fL (ref 80.0–100.0)
Platelets: 164 10*3/uL (ref 150–400)
RBC: 3.83 MIL/uL — ABNORMAL LOW (ref 3.87–5.11)
RDW: 14.5 % (ref 11.5–15.5)
WBC: 6.5 10*3/uL (ref 4.0–10.5)
nRBC: 0 % (ref 0.0–0.2)

## 2019-10-28 LAB — TROPONIN I (HIGH SENSITIVITY)
Troponin I (High Sensitivity): 5 ng/L (ref <18)
Troponin I (High Sensitivity): 6 ng/L (ref <18)

## 2019-10-28 LAB — PROTIME-INR
INR: 1.6 — ABNORMAL HIGH (ref 0.8–1.2)
Prothrombin Time: 18.6 seconds — ABNORMAL HIGH (ref 11.4–15.2)

## 2019-10-28 LAB — SARS CORONAVIRUS 2 BY RT PCR (HOSPITAL ORDER, PERFORMED IN ~~LOC~~ HOSPITAL LAB): SARS Coronavirus 2: NEGATIVE

## 2019-10-28 LAB — HEMOGLOBIN A1C
Hgb A1c MFr Bld: 6.8 % — ABNORMAL HIGH (ref 4.8–5.6)
Mean Plasma Glucose: 148.46 mg/dL

## 2019-10-28 LAB — MAGNESIUM: Magnesium: 1.5 mg/dL — ABNORMAL LOW (ref 1.7–2.4)

## 2019-10-28 LAB — LACTIC ACID, PLASMA
Lactic Acid, Venous: 2.4 mmol/L (ref 0.5–1.9)
Lactic Acid, Venous: 1.4 mmol/L (ref 0.5–1.9)

## 2019-10-28 LAB — ETHANOL: Alcohol, Ethyl (B): <10 mg/dL (ref <10)

## 2019-10-28 LAB — CBG MONITORING, ED: Glucose-Capillary: 186 mg/dL — ABNORMAL HIGH (ref 70–99)

## 2019-10-28 LAB — BRAIN NATRIURETIC PEPTIDE: B Natriuretic Peptide: 114.6 pg/mL — ABNORMAL HIGH (ref 0.0–100.0)

## 2019-10-28 LAB — TSH: TSH: 4.939 u[IU]/mL — ABNORMAL HIGH (ref 0.350–4.500)

## 2019-10-28 MED ORDER — INSULIN ASPART 100 UNIT/ML ~~LOC~~ SOLN
0.0000 [IU] | SUBCUTANEOUS | Status: DC
  Administered 2019-10-29: 1 [IU] via SUBCUTANEOUS
  Administered 2019-10-29: 2 [IU] via SUBCUTANEOUS
  Administered 2019-10-29: 1 [IU] via SUBCUTANEOUS
  Administered 2019-10-29: 2 [IU] via SUBCUTANEOUS
  Administered 2019-10-29: 1 [IU] via SUBCUTANEOUS

## 2019-10-28 NOTE — Progress Notes (Signed)
   10/28/19 1755  Clinical Encounter Type  Visited With Other (Comment) (Spoke with Unit secretary)  Visit Type ED  Referral From Nurse  Consult/Referral To Chaplain  Checked in at ER bridge. Unable to visit with patient. No family present. Advised to page chaplain office if needed.This note was prepared by Deneen Harts, M.Div..  For questions please contact by phone [phone].

## 2019-10-28 NOTE — ED Triage Notes (Signed)
Pt bib [facility] after 2 syncopal episodes. Pt fell and hit her head on the toilet, edema and hematoma noted to R eye. Pt does take blood thinners. Pt arrives to ED AOx4, neuro intact. EMS VSS.

## 2019-10-28 NOTE — Progress Notes (Signed)
Orthopedic Tech Progress Note Patient Details:  Elaine Mathews 20-Sep-1933 [id number] Level 2 trauma  Patient ID: Elaine Mathews, female   DOB: 28-Mar-1933, 86 y.o.   MRN: [id number]   [name] 10/28/2019, 7:30 PM

## 2019-10-28 NOTE — H&P (Signed)
Elaine Mathews:[id number] DOB: 1933-04-23 DOA: 10/28/2019    PCP: Elaine Lei, MD   Outpatient Specialists:   CARDS:  Elaine Browns, MD    NEurology     Dr. Karel Mathews, Elaine Chris, MD   Patient arrived to ER on 10/28/19 at 1724 Referred by Attending Elaine Munch, MD   Patient coming from: home Lives alone but son checks on her   Chief Complaint: fall,syncope Chief Complaint  Patient presents with  . Trauma    HPI: Elaine Mathews is a 86 y.o. female with medical history significant of HLD, HTN, CAD, p A.fib on xarelto, DM2, Ataxia, seizures , asthma, convulsive syncope, dementia,prior right parietal stroke    Presented with a syncope at rest resulting in a fall and hit head on the bathtub resulting in injury . Patient  came in as trauma. On EMS arrival had another episode of syncope.  Patient reports she had diarrhea and some nausea she was not feeling well.   She took Immodium and help diarrhea a bit. Her visiting nurse was there she got up to close the door after the nurse left.  She was on her way to go to the bath room. She felt a bit lightheaded and started to fall she tried to grab for the sink but fell hitting the toilet bowl. She injured her left knee as well. She woke up soon after and pressed her alarm. EMS arrived at 16;20pm. She was alert and able to aswer questions. She did have BM incontinence,  NO CP no SOB.  She have had some leg swelling recently and had recent Doppler study to Ro DVT That was at [facility] she has been taking blood thinner. Yesterday her Lasix was increased.   EMS noted some HTN but otherwise stable vital signs Past history significant for prior syncope episiodes In June 2020 pt was admitted for syncope found to have NSTEMI had cardiac cath, s/p DES to proximal/mid LAD on 6/1. Clopidogrel was added to Xarelto. Prior to discharge pt had more syncopal episiodes while on telemetry Noted to have sinus pause with associated rigid extension of  extremities, head turned to the left, teeth clenched, unresponsive for around 20 seconds with some tonic-clonic jerking. She was incontinent of stool and urine. There was note of transient left-sided weakness and left-sided vision deficit. Sinus pause felt due to vasovagal etiology (she had significant diarrhea at the time). Head CT did not show any acute changes, there was an old right parietal infarct, chronic microvascular disease. EEG was obscured by artifact but in between appeared normal drowsy pattern. She was started on Levetiracetam  BID due to concern about focality with seizure semiology. She was discharged to rehab and has been home alone since June with her son coming in near daily to check on her.   She has been followed by Neurology and Cardiology since.  Infectious risk factors:  Reports none    Has   been vaccinated against COVID x2   Initial COVID TEST   in house  PCR testing  Pending  No results found for: SARSCOV2NAA   Regarding pertinent Chronic problems:     Hyperlipidemia -  on statins Lipitor, Zetia    HTN on diltiazem, lisinopril   chronic CHF diastolic  - per Cath in June 2020 preserved EF On Lasix  Seizure Do on Keppra    CAD  - On Aspirin, statin,  Plavix                 -  followed by cardiology                - last cardiac cath June 2020 sp stent     DM 2 -  on    PO meds only,     Hypothyroidism on synthroid    Asthma -well  controlled on home inhalers      Hx of CVA -  with/out residual deficits on Aspirin 81 mg,  Plavix   A. Fib -  - CHA2DS2 vas score 6    current  on anticoagulation with Xarelto           -  Rate control:  Currently controlled with   Diltiazem,      Liver disease MELD-Na score: 12 at 10/28/2019  5:40 PM Fatty liver disease   Dementia - on  Nemenda   Chronic anemia - baseline hg Hemoglobin & Hematocrit  Recent Labs    10/28/19 1727 10/28/19 1740  HGB 11.3* 11.2*     While in ER: Troponin WNL   ER  Provider Called: cardiology     Elaine Mathews They Recommend admit to medicine   Will see  in ER  Hospitalist was called for admission for syncope  The following Work up has been ordered so far:  Orders Placed This Encounter  Procedures  . DG Chest Port 1 View  . DG Pelvis Portable  . CT HEAD WO CONTRAST  . CT CERVICAL SPINE WO CONTRAST  . DG Knee Left Port  . DG Lumbar Spine 2-3 Views  . DG Thoracic Spine 2 View  . Comprehensive metabolic panel  . CBC  . Ethanol  . Urinalysis, Routine w reflex microscopic  . Lactic acid, plasma  . Protime-INR  . Brain natriuretic peptide  . TSH  . Diet NPO time specified  . Cardiac monitoring  . Measure blood pressure  . Initiate Carrier Fluid Protocol  . Orthostatic vital signs  . Consult for Unassigned Medical Admission  ALL PATIENTS BEING ADMITTED/HAVING PROCEDURES NEED COVID-19 SCREENING  . Inpatient consult to Cardiology  ALL PATIENTS BEING ADMITTED/HAVING PROCEDURES NEED COVID-19 SCREENING  . Pulse oximetry, continuous  . I-Stat Chem 8, ED  . CBG monitoring, ED  . ED EKG  . EKG 12-Lead  . Sample to Blood Bank    Following Medications were ordered in ER: Medications - No data to display    Significant initial  Findings: Abnormal Labs Reviewed  COMPREHENSIVE METABOLIC PANEL - Abnormal; Notable for the following components:      Result Value   Glucose, Bld 184 (*)    AST 50 (*)    All other components within normal limits  CBC - Abnormal; Notable for the following components:   RBC 3.83 (*)    Hemoglobin 11.3 (*)    HCT 35.4 (*)    All other components within normal limits  LACTIC ACID, PLASMA - Abnormal; Notable for the following components:   Lactic Acid, Venous 2.4 (*)    All other components within normal limits  PROTIME-INR - Abnormal; Notable for the following components:   Prothrombin Time 18.6 (*)    INR 1.6 (*)    All other components within normal limits  BRAIN NATRIURETIC PEPTIDE - Abnormal; Notable for the  following components:   B Natriuretic Peptide 114.6 (*)    All other components within normal limits  I-STAT CHEM 8, ED - Abnormal; Notable for the following components:   Glucose, Bld 183 (*)    Calcium, Ion 1.06 (*)  Hemoglobin 11.2 (*)    HCT 33.0 (*)    All other components within normal limits  CBG MONITORING, ED - Abnormal; Notable for the following components:   Glucose-Capillary 186 (*)    All other components within normal limits    Otherwise labs showing:    Recent Labs  Lab 10/28/19 1727 10/28/19 1740  NA 138 137  K 3.6 3.8  CO2 22  --   GLUCOSE 184* 183*  BUN 16 23  CREATININE 0.79 0.70  CALCIUM 9.3  --     Cr   stable,   Lab Results  Component Value Date   CREATININE 0.70 10/28/2019   CREATININE 0.79 10/28/2019    Recent Labs  Lab 10/28/19 1727  AST 50*  ALT 29  ALKPHOS 63  BILITOT 0.7  PROT 7.2  ALBUMIN 4.1   Lab Results  Component Value Date   CALCIUM 9.3 10/28/2019     WBC       Component Value Date/Time   WBC 6.5 10/28/2019 1727   Plt: Lab Results  Component Value Date   PLT 164 10/28/2019    Lactic Acid, Venous    Component Value Date/Time   LATICACIDVEN 2.4 (HH) 10/28/2019 1727     HG/HCT   Stable,     Component Value Date/Time   HGB 11.2 (L) 10/28/2019 1740   HCT 33.0 (L) 10/28/2019 1740   MCV 92.4 10/28/2019 1727     Troponin 6-5 Cardiac Panel (last 3 results) No results for input(s): CKTOTAL, CKMB, TROPONINI, RELINDX in the last 72 hours.     ECG: Ordered Personally reviewed by me showing: HR : 92 Rhythm: NSR,   , no evidence of ischemic changes QTC 524   BNP (last 3 results) Recent Labs    10/28/19 1729  BNP 114.6*     DM  labs:  HbA1C: No results for input(s): HGBA1C in the last 8760 hours.     CBG (last 3)  Recent Labs    10/28/19 1744  GLUCAP 186*       UA  not ordered   Ordered  CT HEAD  NON acute  CXR -  NON acute   ED Triage Vitals  Enc Vitals Group     BP 10/28/19 1523  (!) 162/70     Pulse Rate 10/28/19 1745 84     Resp 10/28/19 1745 17     Temp 10/28/19 1731 98.1 F (36.7 C)     Temp Source 10/28/19 1731 Oral     SpO2 10/28/19 1745 96 %     Weight 10/28/19 1726 124 lb (56.2 kg)     Height 10/28/19 1726 5\' 4"  (1.626 m)     Head Circumference --      Peak Flow --      Pain Score 10/28/19 1726 2     Pain Loc --      Pain Edu? --      Excl. in GC? --   TMAX(24)@       Latest  Blood pressure (!) 200/72, pulse 91, temperature 98.1 F (36.7 C), temperature source Oral, resp. rate 15, height 5\' 4"  (1.626 m), weight 56.2 kg, SpO2 97 %.      Review of Systems:    Pertinent positives include: syncope , abdominal pain, diarrhea,   Constitutional:  No weight loss, night sweats, Fevers, chills, fatigue, weight loss  HEENT:  No headaches, Difficulty swallowing,Tooth/dental problems,Sore throat,  No sneezing, itching, ear ache, nasal congestion, post nasal  drip,  Cardio-vascular:  No chest pain, Orthopnea, PND, anasarca, dizziness, palpitations.no Bilateral lower extremity swelling  GI:  No heartburn, indigestion nausea, vomiting,change in bowel habits, loss of appetite, melena, blood in stool, hematemesis Resp:  no shortness of breath at rest. No dyspnea on exertion, No excess mucus, no productive cough, No non-productive cough, No coughing up of blood.No change in color of mucus.No wheezing. Skin:  no rash or lesions. No jaundice GU:  no dysuria, change in color of urine, no urgency or frequency. No straining to urinate.  No flank pain.  Musculoskeletal:  No joint pain or no joint swelling. No decreased range of motion. No back pain.  Psych:  No change in mood or affect. No depression or anxiety. No memory loss.  Neuro: no localizing neurological complaints, no tingling, no weakness, no double vision, no gait abnormality, no slurred speech, no confusion  All systems reviewed and apart from HOPI all are negative  Past Medical History:   Past  Medical History:  Diagnosis Date  . A-fib (HCC)   . Asthma   . CHF (congestive heart failure) (HCC)   . Coronary artery disease   . Diabetes mellitus without complication (HCC)   . Dysrhythmia   . Hypertension   . Hypothyroidism   . MI (myocardial infarction) (HCC)   . Neuromuscular disorder (HCC)   . Peripheral vascular disease (HCC)   . Stroke (HCC)   . Thyroid disease       Past Surgical History:  Procedure Laterality Date  . CORONARY ANGIOPLASTY      Social History:  Ambulatory  Dan Humphreys    reports that she has never smoked. She has never used smokeless tobacco. She reports previous alcohol use. No history on file for drug use.    Family History:   Family History  Problem Relation Age of Onset  . Diabetes Mother   . CAD Father   . CAD Other     Allergies: Not on File   Prior to Admission medications   Not on File   Physical Exam: Vitals with BMI 10/28/2019 10/28/2019 10/28/2019  Height - - -  Weight - - -  BMI - - -  Systolic - 200 179  Diastolic - 72 66  Pulse 91 92 84     1. General:  in No  Acute distress    Chronically ill -appearing 2. Psychological: Alert and  Oriented 3. Head/ENT:    Dry Mucous Membranes                          Head   traumatic, Right periorbital swelling and left cheek swelling neck supple                           Poor Dentition 4. SKIN:   decreased Skin turgor,  Skin clean Dry multiple bruises and abrasions 5. Heart: Regular rate and rhythm no  Murmur, no Rub or gallop 6. Lungs:  Clear to auscultation bilaterally, no wheezes or crackles   7. Abdomen: Soft, non-tender, Non distended  bowel sounds present 8. Lower extremities: no clubbing, cyanosis, no edema 9. Neurologically Grossly intact, moving all 4 extremities equally   10. MSK: Normal range of motion   All other LABS:     Recent Labs  Lab 10/28/19 1727 10/28/19 1740  WBC 6.5  --   HGB 11.3* 11.2*  HCT 35.4* 33.0*  MCV 92.4  --  PLT 164  --      Recent  Labs  Lab 10/28/19 1727 10/28/19 1740  NA 138 137  K 3.6 3.8  CL 103 100  CO2 22  --   GLUCOSE 184* 183*  BUN 16 23  CREATININE 0.79 0.70  CALCIUM 9.3  --      Recent Labs  Lab 10/28/19 1727  AST 50*  ALT 29  ALKPHOS 63  BILITOT 0.7  PROT 7.2  ALBUMIN 4.1       Cultures: No results found for: SDES, SPECREQUEST, CULT, REPTSTATUS   Radiological Exams on Admission: DG Thoracic Spine 2 View  Result Date: 10/28/2019 CLINICAL DATA:  Back pain EXAM: THORACIC SPINE 2 VIEWS COMPARISON:  None. FINDINGS: There is no evidence of thoracic spine fracture. Alignment is normal. Mild degenerative changes seen in the midthoracic spine with small anterior osteophytes. IMPRESSION: No definite acute fracture. Electronically Signed   By: Jonna Clark M.D.   On: 10/28/2019 19:15   DG Lumbar Spine 2-3 Views  Result Date: 10/28/2019 CLINICAL DATA:  Back pain EXAM: LUMBAR SPINE - 2-3 VIEW COMPARISON:  None. FINDINGS: There is no evidence of lumbar spine fracture. Alignment is normal. Mild disc height loss with facet arthrosis seen in the lower lumbar spine. Scattered dense vascular calcifications are noted. IMPRESSION: No definite osseous fracture. Electronically Signed   By: Jonna Clark M.D.   On: 10/28/2019 19:17   CT HEAD WO CONTRAST  Result Date: 10/28/2019 CLINICAL DATA:  Syncope, fall, head injury, neck trauma EXAM: CT HEAD WITHOUT CONTRAST CT CERVICAL SPINE WITHOUT CONTRAST TECHNIQUE: Multidetector CT imaging of the head and cervical spine was performed following the standard protocol without intravenous contrast. Multiplanar CT image reconstructions of the cervical spine were also generated. COMPARISON:  None. FINDINGS: CT HEAD FINDINGS Brain: Normal anatomic configuration. Parenchymal volume loss is commensurate with the patient's age. Moderate periventricular white matter changes are present likely reflecting the sequela of small vessel ischemia. Focal encephalomalacia within the posterior  right temporal lobe is in keeping with a remote posterior MCA division cortical infarct. No abnormal intra or extra-axial mass lesion or fluid collection. No abnormal mass effect or midline shift. No evidence of acute intracranial hemorrhage or infarct. Ventricular size is normal. Cerebellum unremarkable. Vascular: No asymmetric hyperdense vasculature at the skull base. Skull: Intact Sinuses/Orbits: There is dense opacification of the left frontal sinus and several left ethmoid air cells. Remaining paranasal sinuses are clear. Orbits are unremarkable. Other: Mastoid air cells and middle ear cavities are clear. CT CERVICAL SPINE FINDINGS Alignment: Normal. Skull base and vertebrae: The craniocervical junction is unremarkable. Atlantodental interval is normal. No acute fracture of the cervical spine. No lytic or blastic bone lesions are seen. Soft tissues and spinal canal: No prevertebral fluid or swelling. No visible canal hematoma. Disc levels: Review of the sagittal reformats demonstrates preservation of vertebral body height. There is intervertebral disc space narrowing and endplate remodeling at C5-6 in keeping with changes of mild degenerative disc disease with small posteriorly oriented disc osteophytes noted at this level. Remaining intervertebral disc heights are preserved. Review of the axial images demonstrates multilevel uncovertebral and facet arthrosis. There is resultant mild to moderate bilateral neural foraminal narrowing at C3-4, mild bilateral neural foraminal narrowing at C4-5, moderate bilateral neural foraminal narrowing at C5-6 as well as mild central canal stenosis secondary to posterior disc osteophyte complex resulting in abutment and subtle flattening of the thecal sac, mild bilateral neural foraminal narrowing at C6-7. Upper chest: Negative.  Other: None significant IMPRESSION: 1. No acute intracranial abnormality. 2. Remote posterior right MCA division cortical infarct. 3. Moderate  periventricular white matter changes likely reflecting the sequela of small vessel ischemia. 4. No acute fracture or malalignment of the cervical spine. 5. Mild degenerative disc disease at C5-6. Multilevel degenerative joint disease resulting in multilevel neural foraminal narrowing, as described above, and mild central canal stenosis at C5-6. 6. Dense opacification of the left frontal sinus and several left ethmoid air cells. Electronically Signed   By: Helyn Numbers MD   On: 10/28/2019 18:31   CT CERVICAL SPINE WO CONTRAST  Result Date: 10/28/2019 CLINICAL DATA:  Syncope, fall, head injury, neck trauma EXAM: CT HEAD WITHOUT CONTRAST CT CERVICAL SPINE WITHOUT CONTRAST TECHNIQUE: Multidetector CT imaging of the head and cervical spine was performed following the standard protocol without intravenous contrast. Multiplanar CT image reconstructions of the cervical spine were also generated. COMPARISON:  None. FINDINGS: CT HEAD FINDINGS Brain: Normal anatomic configuration. Parenchymal volume loss is commensurate with the patient's age. Moderate periventricular white matter changes are present likely reflecting the sequela of small vessel ischemia. Focal encephalomalacia within the posterior right temporal lobe is in keeping with a remote posterior MCA division cortical infarct. No abnormal intra or extra-axial mass lesion or fluid collection. No abnormal mass effect or midline shift. No evidence of acute intracranial hemorrhage or infarct. Ventricular size is normal. Cerebellum unremarkable. Vascular: No asymmetric hyperdense vasculature at the skull base. Skull: Intact Sinuses/Orbits: There is dense opacification of the left frontal sinus and several left ethmoid air cells. Remaining paranasal sinuses are clear. Orbits are unremarkable. Other: Mastoid air cells and middle ear cavities are clear. CT CERVICAL SPINE FINDINGS Alignment: Normal. Skull base and vertebrae: The craniocervical junction is unremarkable.  Atlantodental interval is normal. No acute fracture of the cervical spine. No lytic or blastic bone lesions are seen. Soft tissues and spinal canal: No prevertebral fluid or swelling. No visible canal hematoma. Disc levels: Review of the sagittal reformats demonstrates preservation of vertebral body height. There is intervertebral disc space narrowing and endplate remodeling at C5-6 in keeping with changes of mild degenerative disc disease with small posteriorly oriented disc osteophytes noted at this level. Remaining intervertebral disc heights are preserved. Review of the axial images demonstrates multilevel uncovertebral and facet arthrosis. There is resultant mild to moderate bilateral neural foraminal narrowing at C3-4, mild bilateral neural foraminal narrowing at C4-5, moderate bilateral neural foraminal narrowing at C5-6 as well as mild central canal stenosis secondary to posterior disc osteophyte complex resulting in abutment and subtle flattening of the thecal sac, mild bilateral neural foraminal narrowing at C6-7. Upper chest: Negative. Other: None significant IMPRESSION: 1. No acute intracranial abnormality. 2. Remote posterior right MCA division cortical infarct. 3. Moderate periventricular white matter changes likely reflecting the sequela of small vessel ischemia. 4. No acute fracture or malalignment of the cervical spine. 5. Mild degenerative disc disease at C5-6. Multilevel degenerative joint disease resulting in multilevel neural foraminal narrowing, as described above, and mild central canal stenosis at C5-6. 6. Dense opacification of the left frontal sinus and several left ethmoid air cells. Electronically Signed   By: Helyn Numbers MD   On: 10/28/2019 18:31   DG Pelvis Portable  Result Date: 10/28/2019 CLINICAL DATA:  Fall, pelvic pain EXAM: PORTABLE PELVIS 1-2 VIEWS COMPARISON:  None. FINDINGS: There is no evidence of pelvic fracture or diastasis. No pelvic bone lesions are seen.  IMPRESSION: Negative. Electronically Signed   By: Gloris Ham  Ramiro Harvest MD   On: 10/28/2019 18:18   DG Chest Port 1 View  Result Date: 10/28/2019 CLINICAL DATA:  Fall, mid back pain EXAM: PORTABLE CHEST 1 VIEW COMPARISON:  07/02/2018 FINDINGS: Mild eventration of the right hemidiaphragm is unchanged. Lungs are clear. No pneumothorax or pleural effusion. Cardiac size within normal limits. Coronary artery stenting has been performed. Pulmonary vascularity is normal. No acute bone abnormality. IMPRESSION: No active disease. Electronically Signed   By: Helyn Numbers MD   On: 10/28/2019 18:17   DG Knee Left Port  Result Date: 10/28/2019 CLINICAL DATA:  Fall, left knee pain EXAM: PORTABLE LEFT KNEE - 1-2 VIEW COMPARISON:  None. FINDINGS: Four view radiograph left knee demonstrates normal alignment. No fracture or dislocation. Joint spaces are preserved. No effusion. Vascular calcifications are seen within the posterior soft tissues. IMPRESSION: No acute fracture or dislocation. Electronically Signed   By: Helyn Numbers MD   On: 10/28/2019 18:19    Chart has been reviewed   Assessment/Plan   86 y.o. female with medical history significant of HLD, HTN, CAD, p A.fib on xarelto, DM2, Ataxia, seizures , asthma, convulsive syncope, dementia,prior right parietal stroke     Admitted for syncope  Present on Admission: . Syncope and collapse -  given risk factor will admit rehydrate obtain CE,Echo,  monitor on tele and obtain carotid dopplers, would benefit from Cardiology consult In the past syncope was related to seizure activity as well as cardiac cause will monitor on telemetry obtain EEG to see if seizure has played a role   . CAD in native artery - Sp DES in JUne 2021 on Plavix, CT head non acute will continue plavix for now,  Not on aspirin may need to DC xarelto long term given falls,   continue statin  . Essential hypertension - resume lisinopril, and monitor in the pst syncope was associated with  cardiac pause would hold off on diltiazem for now   . AF (paroxysmal atrial fibrillation) (HCC) -on Xarelto for anticoagulation would need to have discussion with cardiology to see if patient still good candidate given falls.   . Chronic diastolic CHF (congestive heart failure) (HCC) -currently appears to be slightly on the dry side will hold Lasix patient have had some diarrhea which probably contributed to diarrhea  . Dementia without behavioral disturbance (HCC) -given GI symptoms hold Namenda for Now . Hypothyroidism -TSH slightly elevated will check T4 T3 free and continue Synthroid for now   . Fatty liver -chronic unchanged slight elevation of LFTs is chronic.  Continue to follow  . Type 2 diabetes mellitus with hyperlipidemia (HCC) -continue statin order sliding scale   seizure disorder - continue Keppra, order EEG in the past had syncope as a result of seizure, if abnormal EEG would need neurology consult  Diarrhea -unclear etiology, has been ongoing for the past 48 hours, Obtain gastric panel for C. difficile testing Start will obtain plain films may need further imaging if worrisome for infectious etiology   Hx of stroke -continue Plavix ,statin and blood pressure control  DM 2-  - Order Sensitive  SSI   -  check TSH and HgA1C  - Hold by mouth medications    Prolonged QT - - will monitor on tele avoid QT prolonging medications, rehydrate correct electrolytes   Knee abrasion - wound consult  Other plan as per orders.  DVT prophylaxis:  xarelto       Code Status:    Code Status: Not on file  DNR/DNI  per patient    I had personally discussed CODE STATUS with patient   Family Communication:   Family not at  Bedside    Disposition Plan:     To home once workup is complete and patient is stable   Following barriers for discharge:                            Electrolytes corrected                                                           Will likely need home health,                              Will need consultants to evaluate patient prior to discharge                     Would benefit from PT/OT eval prior to DC  Ordered                              Consults called:    Cardiology is aware, will see in consult  Admission status:  ED Disposition    ED Disposition Condition Comment   Admit  Hospital Area: [facility] [[id number]]  Level of Care: Telemetry Cardiac [[id number]]  I expect the patient will be discharged within 24 hours: No (not a candidate for 5C-Observation unit)  Covid Evaluation: Asymptomatic Screening Protocol (No Symptoms)  Diagnosis: Syncope and collapse [780.2.ICD-9-CM]  Admitting Physician: Therisa Doyne [[id number]]  Attending Physician: Therisa Doyne [[id number]]       Obs   Level of care   tele  For  24H     No results found for: SARSCOV2NAA   Precautions: admitted as asymptomatic screening protocol    PPE: Used by the provider:   P100  eye Goggles,  Gloves  gown     Aiyla Baucom 10/28/2019, 10:24 PM    [facility]     after 2 AM please page floor coverage PA If 7AM-7PM, please contact the day team taking care of the patient using [employer]   Patient was evaluated in the context of the global COVID-19 pandemic, which necessitated consideration that the patient might be at risk for infection with the SARS-CoV-2 virus that causes COVID-19. Institutional protocols and algorithms that pertain to the evaluation of patients at risk for COVID-19 are in a state of rapid change based on information released by regulatory bodies including the CDC and federal and state organizations. These policies and algorithms were followed during the patient's care.

## 2019-10-28 NOTE — Progress Notes (Signed)
Received to room 3E25 from ER via stretcher. Assisted to bed and positioned for comfort. Oriented to room, bed and unit. In no acute distress.

## 2019-10-28 NOTE — ED Provider Notes (Signed)
[facility] EMERGENCY DEPARTMENT Provider Note   CSN: [id number] Arrival date & time: 10/28/19  1724     History Chief Complaint  Patient presents with  . Trauma    Elaine Mathews is a 86 y.o. female past medical history of CAD, CHF, A. fib on Eliquis presents the ED after syncope and fall at home.  Patient states that she got up from the toilet in the bathroom, lost consciousness and fell, striking her head against the toilet an hour or so prior to presentation.  EMS was called and after arrival they witnessed yet another syncopal event while at rest.  Vital signs in route stable except for moderate hypertension.  Awake alert and oriented x4 the duration of her transport, also complaining of left knee pain where there is a small abrasion.  The history is provided by the patient and the EMS personnel.  Trauma Mechanism of injury: fall Injury location: head/neck Injury location detail: head Incident location: bathroom Time since incident: 1 hour Arrived directly from scene: yes   Fall:      Height of fall: standing      Point of impact: head      Entrapped after fall: no  EMS/PTA data:      Ambulatory at scene: yes      Oriented to: person, place, situation and time      Loss of consciousness: yes      Amnesic to event: no  Current symptoms:      Associated symptoms:            Reports back pain, headache and loss of consciousness.            Denies abdominal pain, chest pain and vomiting.       Past Medical History:  Diagnosis Date  . A-fib (HCC)   . Asthma   . CHF (congestive heart failure) (HCC)   . Coronary artery disease   . Diabetes mellitus without complication (HCC)   . Dysrhythmia   . Hypertension   . Hypothyroidism   . MI (myocardial infarction) (HCC)   . Neuromuscular disorder (HCC)   . Peripheral vascular disease (HCC)   . Stroke (HCC)   . Thyroid disease     Patient Active Problem List   Diagnosis Date Noted  . Syncope and  collapse 10/28/2019  . DM2 (diabetes mellitus, type 2) (HCC) 10/28/2019  . Seizure disorder (HCC) 10/28/2019  . Diarrhea 10/28/2019  . Syncope, vasovagal 10/28/2019  . CAD in native artery 10/28/2019  . Essential hypertension 10/28/2019  . AF (paroxysmal atrial fibrillation) (HCC) 10/28/2019  . Chronic diastolic CHF (congestive heart failure) (HCC) 10/28/2019  . Dementia without behavioral disturbance (HCC) 10/28/2019  . Hypothyroidism 10/28/2019  . Fatty liver 10/28/2019  . Type 2 diabetes mellitus with hyperlipidemia (HCC) 10/28/2019  . History of stroke 10/28/2019  . Prolonged QT interval 10/28/2019  . Abrasion, knee, left, initial encounter 10/28/2019    Past Surgical History:  Procedure Laterality Date  . CORONARY ANGIOPLASTY       OB History   No obstetric history on file.     Family History  Problem Relation Age of Onset  . Diabetes Mother   . CAD Father   . CAD Other     Social History   Tobacco Use  . Smoking status: Never Smoker  . Smokeless tobacco: Never Used  Substance Use Topics  . Alcohol use: Not Currently  . Drug use: Not on file  Home Medications Prior to Admission medications   Medication Sig Start Date End Date Taking? Authorizing Provider  atorvastatin (LIPITOR) 40 MG tablet Take 40 mg by mouth daily.   Yes [provider]  clopidogrel (PLAVIX) 75 MG tablet Take 75 mg by mouth daily.   Yes [provider]  diltiazem (DILACOR XR) 180 MG 24 hr capsule Take 180 mg by mouth daily.   Yes [provider]  ezetimibe (ZETIA) 10 MG tablet Take 10 mg by mouth daily.   Yes [provider]  famotidine (PEPCID) 40 MG tablet Take 40 mg by mouth daily.   Yes [provider]  furosemide (LASIX) 20 MG tablet Take 20 mg by mouth.   Yes [provider]  levETIRAcetam (KEPPRA) 250 MG tablet Take 250-500 mg by mouth 2 (two) times daily. 250mg  q Am 500mg  q OM   Yes [provider]  levothyroxine  (SYNTHROID) 75 MCG tablet Take 75 mcg by mouth daily before breakfast.   Yes [provider]  lisinopril (ZESTRIL) 40 MG tablet Take 40 mg by mouth daily.   Yes [provider]  memantine (NAMENDA) 10 MG tablet Take 10 mg by mouth 2 (two) times daily.   Yes [provider]  metFORMIN (GLUCOPHAGE) 500 MG tablet Take 500 mg by mouth daily with breakfast.   Yes [provider]  rivaroxaban (XARELTO) 20 MG TABS tablet Take 20 mg by mouth daily with supper.   Yes [provider]    Allergies    Codeine and Penicillins  Review of Systems   Review of Systems  Constitutional: Negative for chills and fever.  HENT: Negative for facial swelling and voice change.   Eyes: Negative for redness and visual disturbance.  Respiratory: Negative for cough and shortness of breath.   Cardiovascular: Negative for chest pain and palpitations.  Gastrointestinal: Negative for abdominal pain and vomiting.  Genitourinary: Negative for difficulty urinating and dysuria.  Musculoskeletal: Positive for back pain. Negative for gait problem and joint swelling.       Positive for knee pain  Skin: Negative for rash and wound.  Neurological: Positive for loss of consciousness, syncope and headaches. Negative for dizziness.  Psychiatric/Behavioral: Negative for confusion and suicidal ideas.    Physical Exam Updated Vital Signs BP (!) 200/72   Pulse 91   Temp 98.1 F (36.7 C) (Oral)   Resp 15   Ht 5\' 4"  (1.626 m)   Wt 56.2 kg   SpO2 97%   BMI 21.28 kg/m   Physical Exam Constitutional:      General: She is not in acute distress. HENT:     Head: Normocephalic.     Comments: Small laceration and surrounding ecchymosis just lateral to the right eye    Mouth/Throat:     Mouth: Mucous membranes are moist.     Pharynx: Oropharynx is clear.  Eyes:     General: No scleral icterus.    Pupils: Pupils are equal, round, and reactive to light.  Cardiovascular:     Rate and  Rhythm: Normal rate and regular rhythm.     Pulses: Normal pulses.  Pulmonary:     Effort: Pulmonary effort is normal. No respiratory distress.  Abdominal:     General: There is no distension.     Tenderness: There is no abdominal tenderness.  Musculoskeletal:        General: No tenderness or deformity.     Cervical back: Normal range of motion and neck supple.  Comments: Midline tenderness at about T2  Skin:    Comments: Skin tear to the left knee, hemostatic  Neurological:     General: No focal deficit present.     Mental Status: She is alert and oriented to person, place, and time.  Psychiatric:        Mood and Affect: Mood normal.        Behavior: Behavior normal.     ED Results / Procedures / Treatments   Labs (all labs ordered are listed, but only abnormal results are displayed) Labs Reviewed  COMPREHENSIVE METABOLIC PANEL - Abnormal; Notable for the following components:      Result Value   Glucose, Bld 184 (*)    AST 50 (*)    All other components within normal limits  CBC - Abnormal; Notable for the following components:   RBC 3.83 (*)    Hemoglobin 11.3 (*)    HCT 35.4 (*)    All other components within normal limits  LACTIC ACID, PLASMA - Abnormal; Notable for the following components:   Lactic Acid, Venous 2.4 (*)    All other components within normal limits  PROTIME-INR - Abnormal; Notable for the following components:   Prothrombin Time 18.6 (*)    INR 1.6 (*)    All other components within normal limits  BRAIN NATRIURETIC PEPTIDE - Abnormal; Notable for the following components:   B Natriuretic Peptide 114.6 (*)    All other components within normal limits  TSH - Abnormal; Notable for the following components:   TSH 4.939 (*)    All other components within normal limits  I-STAT CHEM 8, ED - Abnormal; Notable for the following components:   Glucose, Bld 183 (*)    Calcium, Ion 1.06 (*)    Hemoglobin 11.2 (*)    HCT 33.0 (*)    All other  components within normal limits  CBG MONITORING, ED - Abnormal; Notable for the following components:   Glucose-Capillary 186 (*)    All other components within normal limits  SARS CORONAVIRUS 2 BY RT PCR (HOSPITAL ORDER, PERFORMED IN  HOSPITAL LAB)  GASTROINTESTINAL PANEL BY PCR, STOOL (REPLACES STOOL CULTURE)  C DIFFICILE QUICK SCREEN W PCR REFLEX  ETHANOL  URINALYSIS, ROUTINE W REFLEX MICROSCOPIC  HEMOGLOBIN A1C  CK  MAGNESIUM  PHOSPHORUS  T3  T4, FREE  LACTIC ACID, PLASMA  SAMPLE TO BLOOD BANK  TROPONIN I (HIGH SENSITIVITY)  TROPONIN I (HIGH SENSITIVITY)    EKG None  Radiology DG Thoracic Spine 2 View  Result Date: 10/28/2019 CLINICAL DATA:  Back pain EXAM: THORACIC SPINE 2 VIEWS COMPARISON:  None. FINDINGS: There is no evidence of thoracic spine fracture. Alignment is normal. Mild degenerative changes seen in the midthoracic spine with small anterior osteophytes. IMPRESSION: No definite acute fracture. Electronically Signed   By: Jonna Clark M.D.   On: 10/28/2019 19:15   DG Lumbar Spine 2-3 Views  Result Date: 10/28/2019 CLINICAL DATA:  Back pain EXAM: LUMBAR SPINE - 2-3 VIEW COMPARISON:  None. FINDINGS: There is no evidence of lumbar spine fracture. Alignment is normal. Mild disc height loss with facet arthrosis seen in the lower lumbar spine. Scattered dense vascular calcifications are noted. IMPRESSION: No definite osseous fracture. Electronically Signed   By: Jonna Clark M.D.   On: 10/28/2019 19:17   DG Abd 1 View  Result Date: 10/28/2019 CLINICAL DATA:  Fall, diarrhea EXAM: ABDOMEN - 1 VIEW COMPARISON:  07/13/2018 FINDINGS: 2 supine frontal views of the abdomen  and pelvis demonstrate an unremarkable bowel gas pattern. No masses or abnormal calcifications. No acute bony abnormalities. Multilevel lumbar spondylosis unchanged. IMPRESSION: 1. Unremarkable bowel gas pattern. Electronically Signed   By: Sharlet Salina M.D.   On: 10/28/2019 21:50   CT HEAD WO  CONTRAST  Result Date: 10/28/2019 CLINICAL DATA:  Syncope, fall, head injury, neck trauma EXAM: CT HEAD WITHOUT CONTRAST CT CERVICAL SPINE WITHOUT CONTRAST TECHNIQUE: Multidetector CT imaging of the head and cervical spine was performed following the standard protocol without intravenous contrast. Multiplanar CT image reconstructions of the cervical spine were also generated. COMPARISON:  None. FINDINGS: CT HEAD FINDINGS Brain: Normal anatomic configuration. Parenchymal volume loss is commensurate with the patient's age. Moderate periventricular white matter changes are present likely reflecting the sequela of small vessel ischemia. Focal encephalomalacia within the posterior right temporal lobe is in keeping with a remote posterior MCA division cortical infarct. No abnormal intra or extra-axial mass lesion or fluid collection. No abnormal mass effect or midline shift. No evidence of acute intracranial hemorrhage or infarct. Ventricular size is normal. Cerebellum unremarkable. Vascular: No asymmetric hyperdense vasculature at the skull base. Skull: Intact Sinuses/Orbits: There is dense opacification of the left frontal sinus and several left ethmoid air cells. Remaining paranasal sinuses are clear. Orbits are unremarkable. Other: Mastoid air cells and middle ear cavities are clear. CT CERVICAL SPINE FINDINGS Alignment: Normal. Skull base and vertebrae: The craniocervical junction is unremarkable. Atlantodental interval is normal. No acute fracture of the cervical spine. No lytic or blastic bone lesions are seen. Soft tissues and spinal canal: No prevertebral fluid or swelling. No visible canal hematoma. Disc levels: Review of the sagittal reformats demonstrates preservation of vertebral body height. There is intervertebral disc space narrowing and endplate remodeling at C5-6 in keeping with changes of mild degenerative disc disease with small posteriorly oriented disc osteophytes noted at this level. Remaining  intervertebral disc heights are preserved. Review of the axial images demonstrates multilevel uncovertebral and facet arthrosis. There is resultant mild to moderate bilateral neural foraminal narrowing at C3-4, mild bilateral neural foraminal narrowing at C4-5, moderate bilateral neural foraminal narrowing at C5-6 as well as mild central canal stenosis secondary to posterior disc osteophyte complex resulting in abutment and subtle flattening of the thecal sac, mild bilateral neural foraminal narrowing at C6-7. Upper chest: Negative. Other: None significant IMPRESSION: 1. No acute intracranial abnormality. 2. Remote posterior right MCA division cortical infarct. 3. Moderate periventricular white matter changes likely reflecting the sequela of small vessel ischemia. 4. No acute fracture or malalignment of the cervical spine. 5. Mild degenerative disc disease at C5-6. Multilevel degenerative joint disease resulting in multilevel neural foraminal narrowing, as described above, and mild central canal stenosis at C5-6. 6. Dense opacification of the left frontal sinus and several left ethmoid air cells. Electronically Signed   By: Helyn Numbers MD   On: 10/28/2019 18:31   CT CERVICAL SPINE WO CONTRAST  Result Date: 10/28/2019 CLINICAL DATA:  Syncope, fall, head injury, neck trauma EXAM: CT HEAD WITHOUT CONTRAST CT CERVICAL SPINE WITHOUT CONTRAST TECHNIQUE: Multidetector CT imaging of the head and cervical spine was performed following the standard protocol without intravenous contrast. Multiplanar CT image reconstructions of the cervical spine were also generated. COMPARISON:  None. FINDINGS: CT HEAD FINDINGS Brain: Normal anatomic configuration. Parenchymal volume loss is commensurate with the patient's age. Moderate periventricular white matter changes are present likely reflecting the sequela of small vessel ischemia. Focal encephalomalacia within the posterior right temporal lobe is in  keeping with a remote  posterior MCA division cortical infarct. No abnormal intra or extra-axial mass lesion or fluid collection. No abnormal mass effect or midline shift. No evidence of acute intracranial hemorrhage or infarct. Ventricular size is normal. Cerebellum unremarkable. Vascular: No asymmetric hyperdense vasculature at the skull base. Skull: Intact Sinuses/Orbits: There is dense opacification of the left frontal sinus and several left ethmoid air cells. Remaining paranasal sinuses are clear. Orbits are unremarkable. Other: Mastoid air cells and middle ear cavities are clear. CT CERVICAL SPINE FINDINGS Alignment: Normal. Skull base and vertebrae: The craniocervical junction is unremarkable. Atlantodental interval is normal. No acute fracture of the cervical spine. No lytic or blastic bone lesions are seen. Soft tissues and spinal canal: No prevertebral fluid or swelling. No visible canal hematoma. Disc levels: Review of the sagittal reformats demonstrates preservation of vertebral body height. There is intervertebral disc space narrowing and endplate remodeling at C5-6 in keeping with changes of mild degenerative disc disease with small posteriorly oriented disc osteophytes noted at this level. Remaining intervertebral disc heights are preserved. Review of the axial images demonstrates multilevel uncovertebral and facet arthrosis. There is resultant mild to moderate bilateral neural foraminal narrowing at C3-4, mild bilateral neural foraminal narrowing at C4-5, moderate bilateral neural foraminal narrowing at C5-6 as well as mild central canal stenosis secondary to posterior disc osteophyte complex resulting in abutment and subtle flattening of the thecal sac, mild bilateral neural foraminal narrowing at C6-7. Upper chest: Negative. Other: None significant IMPRESSION: 1. No acute intracranial abnormality. 2. Remote posterior right MCA division cortical infarct. 3. Moderate periventricular white matter changes likely reflecting  the sequela of small vessel ischemia. 4. No acute fracture or malalignment of the cervical spine. 5. Mild degenerative disc disease at C5-6. Multilevel degenerative joint disease resulting in multilevel neural foraminal narrowing, as described above, and mild central canal stenosis at C5-6. 6. Dense opacification of the left frontal sinus and several left ethmoid air cells. Electronically Signed   By: Helyn Numbers MD   On: 10/28/2019 18:31   DG Pelvis Portable  Result Date: 10/28/2019 CLINICAL DATA:  Fall, pelvic pain EXAM: PORTABLE PELVIS 1-2 VIEWS COMPARISON:  None. FINDINGS: There is no evidence of pelvic fracture or diastasis. No pelvic bone lesions are seen. IMPRESSION: Negative. Electronically Signed   By: Helyn Numbers MD   On: 10/28/2019 18:18   DG Chest Port 1 View  Result Date: 10/28/2019 CLINICAL DATA:  Fall, mid back pain EXAM: PORTABLE CHEST 1 VIEW COMPARISON:  07/02/2018 FINDINGS: Mild eventration of the right hemidiaphragm is unchanged. Lungs are clear. No pneumothorax or pleural effusion. Cardiac size within normal limits. Coronary artery stenting has been performed. Pulmonary vascularity is normal. No acute bone abnormality. IMPRESSION: No active disease. Electronically Signed   By: Helyn Numbers MD   On: 10/28/2019 18:17   DG Knee Left Port  Result Date: 10/28/2019 CLINICAL DATA:  Fall, left knee pain EXAM: PORTABLE LEFT KNEE - 1-2 VIEW COMPARISON:  None. FINDINGS: Four view radiograph left knee demonstrates normal alignment. No fracture or dislocation. Joint spaces are preserved. No effusion. Vascular calcifications are seen within the posterior soft tissues. IMPRESSION: No acute fracture or dislocation. Electronically Signed   By: Helyn Numbers MD   On: 10/28/2019 18:19    Procedures Procedures (including critical care time)  Medications Ordered in ED Medications  insulin aspart (novoLOG) injection 0-9 Units (has no administration in time range)    ED Course  I have  reviewed the triage  vital signs and the nursing notes.  Pertinent labs & imaging results that were available during my care of the patient were reviewed by me and considered in my medical decision making (see chart for details).    MDM Rules/Calculators/A&P                          Possible differential diagnoses I considered included:  ICH, skull fracture, concussion, C-spine injury, thoracic trauma, abdominal trauma, spinal injury, fracture, neurovascular injury, laceration  Workup/Thought Process:  Patient presents following syncope at rest and a fall at home, initial presentation soon after getting up from the toilet with no prodrome for which she fell forward and struck her head with obvious facial ecchymosis and skin tears.  Reportedly an additional syncope at rest to follow with EMS, unclear rhythm at that time.  Primary survey performed with findings above  Secondary survey performed with findings above  Vitals, labs, EKGs, and imaging were reviewed by myself, and were significant for: Vitals:   10/28/19 1813 10/28/19 1915  BP: (!) 200/72   Pulse: 92 91  Resp: 18 15  Temp:    SpO2: 94% 97%   EKG findings by my read: Compared to prior: None.  Rate: 92 rhythm: sinus Axis: appropriate  PR: Appropriate QRS: 132 QTc: 524.  QT prolongation with what appears to be incomplete right bundle branch block, otherwise no evidence of ischemia or arrhythmia, nor any other pathologic findings concerning considering patient presentation. Findings discussed with attending who agrees.  Trauma scans including CT head,  CT C-spine, significant for: No clinically significant injuries, C-spine cleared.  Plain films of left wrist, chest pelvis significant for: No clinically significant injuries  Trauma labs including CBC, CMP, urinalysis, lactic acid, indicated. Findings include: Slight lactic acidosis, could be related to traumatic injury or dehydration, low suspicion for sepsis at this time.   No evident source of infection on chest x-ray or urinalysis.  From a traumatic standpoint, patient cleared however her report of 2 episodes of syncope at rest in the setting of her known heart failure is very concerning.  EKG as above reassuring with no troponin elevation, minimally elevated BNP.  Patient would benefit from telemetry, formal echocardiogram and syncope work-up given her high risk.  Hospital medicine consulted for admission.  Spoke with Dr. Adela Glimpse who agreed that the patient will benefit for admission, requesting cardiology consult.  Cardiologist (Dr. Scarlette Calico) consulted for recommendations who agreed to see the patient.  Per their recommendations, recommend admission to hospitalist for telemetry, echo in the morning, have no other additional recommendations at this time.  The plan for this patient was discussed with Dr. Jeraldine Loots who voiced agreement and who oversaw evaluation and treatment of this patient.   Final Clinical Impression(s) / ED Diagnoses Final diagnoses:  Fall  Syncope and collapse    Rx / DC Orders ED Discharge Orders    None     Labs, studies and imaging reviewed by myself and considered in medical decision making if ordered. Imaging interpreted by radiology. Pt was discussed with my attending, Dr. Jeraldine Loots  Electronically signed by:  Christiane Ha Redding9/16/202110:51 PM       Loree Fee, MD 10/28/19 2251    Gerhard Munch, MD 11/03/19 [phone]

## 2019-10-28 NOTE — ED Notes (Signed)
Elaine Mathews, son, [phone] would like an update when available

## 2019-10-28 NOTE — Consult Note (Signed)
Cardiology Consultation:   Patient ID: EILIS CHESTNUTT MRN: [id number]; DOB: 09/20/1933  Admit date: 10/28/2019 Date of Consult: 10/28/2019  Primary Care Provider: Lucianne Lei, MD [facility] Cardiologist: Garwin Brothers, MD  [facility] Electrophysiologist:  None    Patient Profile:   Elaine Mathews is a 86 y.o. female with a hx of NSTEMI, hypertension, hyperlipidemia, and paroxysmal atrial fibrillation who is being seen today for the evaluation of syncope at the request of Dr Adela Glimpse.  History of Present Illness:   [name] called EMS this evening after an episode of syncope.  She was walking through her bathroom when she felt lightheaded and unsteady and subsequently lost consciousness.  She struck her head when falling.  She is on apixaban, which she took last on the morning of 9/16.  On EMS arrival she was alert, oriented, and neurologically intact.  While on the stretcher, she had another episode of witnessed syncope.  In the emergency department, trauma evaluation has been normal and she has had stable vitals.  She does report that she has had "irritable bowel symptoms" with loose stools for the past several days.  She is eating and drinking normally.  She has had no fever or chills, no chest pain, no dyspnea on exertion.  She does have occasional presyncopal episodes.  In January 2020, she was admitted after an episode of syncope.  She has had an NSTEMI with troponin peak of 7.48.  Cardiac catheterization showed two-vessel coronary disease of LAD and left circumflex with normal LV function.  She received PCI to the proximal/mid LAD.  She had a witnessed seizure during that admission and was discharged on Keppra.  She also was noted to have 1 sinus pause on telemetry during that admission.  Beta-blockers were stopped.  Past Medical History:  Diagnosis Date  . A-fib (HCC)   . Diabetes mellitus without complication (HCC)   . Hypertension   . MI (myocardial infarction) (HCC)     . Thyroid disease    Surgical history cholecystectomy, appendectomy, cataract surgery, tonsillectomy   Home Medications:  Current Meds  Medication Sig  . acetaminophen (TYLENOL) 500 MG tablet Take 500 mg by mouth in the morning and at bedtime.  Marland Kitchen atorvastatin (LIPITOR) 40 MG tablet Take 1 tablet (40 mg total) by mouth daily at 6 PM.  . clopidogrel (PLAVIX) 75 MG tablet Take 1 tablet (75 mg total) by mouth daily with breakfast.  . diltiazem (CARDIZEM CD) 180 MG 24 hr capsule Take 180 mg by mouth daily.  Marland Kitchen ezetimibe (ZETIA) 10 MG tablet Take 10 mg by mouth at bedtime.   . famotidine (PEPCID) 40 MG tablet Take 40 mg by mouth daily.  . fluticasone (FLONASE) 50 MCG/ACT nasal spray Place 2 sprays into both nostrils daily.  . furosemide (LASIX) 20 MG tablet Take 20 mg by mouth daily.  . Glucosamine-Chondroit-Vit C-Mn (GLUCOSAMINE 1500 COMPLEX PO) Take 1 capsule by mouth daily at 12 noon.   Marland Kitchen glucose blood test strip 1 each by Other route as needed. Use as instructed  . Lactobacillus (ACIDOPHILUS PO) Take 175 mg by mouth daily.  Marland Kitchen levETIRAcetam (KEPPRA) 250 MG tablet TAKE 1 TABLET BY MOUTH EVERY MORNING ANDTAKE 2 TABLETS EVERY EVENING  . levothyroxine (SYNTHROID, LEVOTHROID) 75 MCG tablet Take 75 mcg by mouth as directed. Take 1.5 tablets daily Mon-Fri and 1 tablet on Sat and Sun  . lisinopril (ZESTRIL) 40 MG tablet Take 1 tablet by mouth daily.  . memantine (NAMENDA) 10 MG  tablet 10 mg 2 (two) times daily.   . metFORMIN (GLUCOPHAGE) 500 MG tablet Take 500 mg by mouth daily.  . montelukast (SINGULAIR) 10 MG tablet Take 10 mg by mouth at bedtime.  . nitroGLYCERIN (NITROSTAT) 0.4 MG SL tablet Place 0.4 mg under the tongue every 5 (five) minutes as needed for chest pain.   . potassium chloride (KLOR-CON) 10 MEQ tablet Take 10 mEq by mouth daily.  . Prenatal Vit-Fe Fumarate-FA (MULTIVITAMIN-PRENATAL) 27-0.8 MG TABS tablet Take 1 tablet by mouth daily at 12 noon.  . rivaroxaban (XARELTO) 20 MG TABS  tablet Take 20 mg by mouth daily with supper.  . sodium chloride 1 g tablet Take 1 g by mouth once.  . [DISCONTINUED] ergocalciferol (VITAMIN D2) 50000 units capsule Take    Allergies:    Allergies  Allergen Reactions  . Codeine   . Penicillins     Social History:   Social History   Socioeconomic History  . Marital status: Divorced    Spouse name: Not on file  . Number of children: Not on file  . Years of education: Not on file  . Highest education level: Not on file  Occupational History  . Not on file  Tobacco Use  . Smoking status: Not on file  Substance and Sexual Activity  . Alcohol use: Not on file  . Drug use: Not on file  . Sexual activity: Not on file  Other Topics Concern  . Not on file  Social History Narrative  . Not on file   Social Determinants of Health   Financial Resource Strain:   . Difficulty of Paying Living Expenses: Not on file  Food Insecurity:   . Worried About Programme researcher, broadcasting/film/video in the Last Year: Not on file  . Ran Out of Food in the Last Year: Not on file  Transportation Needs:   . Lack of Transportation (Medical): Not on file  . Lack of Transportation (Non-Medical): Not on file  Physical Activity:   . Days of Exercise per Week: Not on file  . Minutes of Exercise per Session: Not on file  Stress:   . Feeling of Stress : Not on file  Social Connections:   . Frequency of Communication with Friends and Family: Not on file  . Frequency of Social Gatherings with Friends and Family: Not on file  . Attends Religious Services: Not on file  . Active Member of Clubs or Organizations: Not on file  . Attends Banker Meetings: Not on file  . Marital Status: Not on file  Intimate Partner Violence:   . Fear of Current or Ex-Partner: Not on file  . Emotionally Abused: Not on file  . Physically Abused: Not on file  . Sexually Abused: Not on file    Family History:   Family History  Problem Relation Age of Onset  . Diabetes Mother    . CAD Father   . CAD Other      ROS:  Please see the history of present illness.  All other ROS reviewed and negative.     Physical Exam/Data:   Vitals:   10/28/19 1731 10/28/19 1745 10/28/19 1813 10/28/19 1915  BP:  (!) 179/66 (!) 200/72   Pulse:  84 92 91  Resp:  17 18 15   Temp: 98.1 F (36.7 C)     TempSrc: Oral     SpO2:  96% 94% 97%  Weight:      Height:  No intake or output data in the 24 hours ending 10/28/19 2017 Last 3 Weights 10/28/2019  Weight (lbs) 124 lb  Weight (kg) 56.246 kg     Body mass index is 21.28 kg/m.  General:  Well nourished, well developed, in no acute distress HEENT: Right eyelid is markedly erythematous and swollen with small shallow laceration.  Bruise on left cheek. Neck: no JVD Endocrine:  No thryomegaly Vascular: No carotid bruits; FA pulses 2+ bilaterally without bruits  Cardiac:  normal S1, S2; RRR; no murmur  Lungs:  clear to auscultation bilaterally, no wheezing, rhonchi or rales  Abd: soft, nontender, no hepatomegaly  Ext: no edema Musculoskeletal:  No deformities, BUE and BLE strength normal and equal Skin: warm and dry.  Abrasion on left knee. Neuro:  CNs 2-12 intact, no focal abnormalities noted Psych:  Normal affect   EKG:  The EKG was personally reviewed and demonstrates: Sinus rhythm with right bundle branch block.  QRS is 132 ms.  No preexcitation.   Telemetry:  Telemetry was personally reviewed and demonstrates: Sinus rhythm and sinus tachycardia with no pauses, no nonsustained VT  Relevant CV Studies:  Cardiac catheterization on 07/13/2018 1. Two vessel coronary artery disease with multifocal LAD (40% proximal, 60-70% mid, and 80% apical stenoses). Proximal and mid LAD disease is highly significant by DFR (0.74). Culpril lesion for NSTEMI is likely occluded small OM2 branch, which is too small for PCI. 2. Normal left ventricular systolic function with mildly elevated filling pressure (LVEDP 15-20  mmHg). 3. Successful DFR-guided PCI to the proximal/mid LAD using a Resolute Onyx 2.75 x 18 mm drug-eluting stent with 0% residual stenosis and TIMI-3 flow.  Echocardiogram 10/24/2017 [facility]) Normal left ventricular biventricular function with LVEF 60 to 65%.  No major valvular abnormalities.  RVSP 24 mmHg  Laboratory Data:  High Sensitivity Troponin:   Recent Labs  Lab 10/28/19 1727  TROPONINIHS 5     Chemistry Recent Labs  Lab 10/28/19 1727 10/28/19 1740  NA 138 137  K 3.6 3.8  CL 103 100  CO2 22  --   GLUCOSE 184* 183*  BUN 16 23  CREATININE 0.79 0.70  CALCIUM 9.3  --   GFRNONAA >60  --   GFRAA >60  --   ANIONGAP 13  --     Recent Labs  Lab 10/28/19 1727  PROT 7.2  ALBUMIN 4.1  AST 50*  ALT 29  ALKPHOS 63  BILITOT 0.7   Hematology Recent Labs  Lab 10/28/19 1727 10/28/19 1740  WBC 6.5  --   RBC 3.83*  --   HGB 11.3* 11.2*  HCT 35.4* 33.0*  MCV 92.4  --   MCH 29.5  --   MCHC 31.9  --   RDW 14.5  --   PLT 164  --    BNP Recent Labs  Lab 10/28/19 1729  BNP 114.6*    DDimer No results for input(s): DDIMER in the last 168 hours.   Radiology/Studies:  DG Thoracic Spine 2 View  Result Date: 10/28/2019 CLINICAL DATA:  Back pain EXAM: THORACIC SPINE 2 VIEWS COMPARISON:  None. FINDINGS: There is no evidence of thoracic spine fracture. Alignment is normal. Mild degenerative changes seen in the midthoracic spine with small anterior osteophytes. IMPRESSION: No definite acute fracture. Electronically Signed   By: Jonna Clark M.D.   On: 10/28/2019 19:15   DG Lumbar Spine 2-3 Views  Result Date: 10/28/2019 CLINICAL DATA:  Back pain EXAM: LUMBAR SPINE - 2-3 VIEW COMPARISON:  None. FINDINGS: There is no evidence of lumbar spine fracture. Alignment is normal. Mild disc height loss with facet arthrosis seen in the lower lumbar spine. Scattered dense vascular calcifications are noted. IMPRESSION: No definite osseous fracture. Electronically Signed   By: Jonna Clark M.D.   On: 10/28/2019 19:17   CT HEAD WO CONTRAST  Result Date: 10/28/2019 CLINICAL DATA:  Syncope, fall, head injury, neck trauma EXAM: CT HEAD WITHOUT CONTRAST CT CERVICAL SPINE WITHOUT CONTRAST TECHNIQUE: Multidetector CT imaging of the head and cervical spine was performed following the standard protocol without intravenous contrast. Multiplanar CT image reconstructions of the cervical spine were also generated. COMPARISON:  None. FINDINGS: CT HEAD FINDINGS Brain: Normal anatomic configuration. Parenchymal volume loss is commensurate with the patient's age. Moderate periventricular white matter changes are present likely reflecting the sequela of small vessel ischemia. Focal encephalomalacia within the posterior right temporal lobe is in keeping with a remote posterior MCA division cortical infarct. No abnormal intra or extra-axial mass lesion or fluid collection. No abnormal mass effect or midline shift. No evidence of acute intracranial hemorrhage or infarct. Ventricular size is normal. Cerebellum unremarkable. Vascular: No asymmetric hyperdense vasculature at the skull base. Skull: Intact Sinuses/Orbits: There is dense opacification of the left frontal sinus and several left ethmoid air cells. Remaining paranasal sinuses are clear. Orbits are unremarkable. Other: Mastoid air cells and middle ear cavities are clear. CT CERVICAL SPINE FINDINGS Alignment: Normal. Skull base and vertebrae: The craniocervical junction is unremarkable. Atlantodental interval is normal. No acute fracture of the cervical spine. No lytic or blastic bone lesions are seen. Soft tissues and spinal canal: No prevertebral fluid or swelling. No visible canal hematoma. Disc levels: Review of the sagittal reformats demonstrates preservation of vertebral body height. There is intervertebral disc space narrowing and endplate remodeling at C5-6 in keeping with changes of mild degenerative disc disease with small posteriorly oriented  disc osteophytes noted at this level. Remaining intervertebral disc heights are preserved. Review of the axial images demonstrates multilevel uncovertebral and facet arthrosis. There is resultant mild to moderate bilateral neural foraminal narrowing at C3-4, mild bilateral neural foraminal narrowing at C4-5, moderate bilateral neural foraminal narrowing at C5-6 as well as mild central canal stenosis secondary to posterior disc osteophyte complex resulting in abutment and subtle flattening of the thecal sac, mild bilateral neural foraminal narrowing at C6-7. Upper chest: Negative. Other: None significant IMPRESSION: 1. No acute intracranial abnormality. 2. Remote posterior right MCA division cortical infarct. 3. Moderate periventricular white matter changes likely reflecting the sequela of small vessel ischemia. 4. No acute fracture or malalignment of the cervical spine. 5. Mild degenerative disc disease at C5-6. Multilevel degenerative joint disease resulting in multilevel neural foraminal narrowing, as described above, and mild central canal stenosis at C5-6. 6. Dense opacification of the left frontal sinus and several left ethmoid air cells. Electronically Signed   By: Helyn Numbers MD   On: 10/28/2019 18:31   CT CERVICAL SPINE WO CONTRAST  Result Date: 10/28/2019 CLINICAL DATA:  Syncope, fall, head injury, neck trauma EXAM: CT HEAD WITHOUT CONTRAST CT CERVICAL SPINE WITHOUT CONTRAST TECHNIQUE: Multidetector CT imaging of the head and cervical spine was performed following the standard protocol without intravenous contrast. Multiplanar CT image reconstructions of the cervical spine were also generated. COMPARISON:  None. FINDINGS: CT HEAD FINDINGS Brain: Normal anatomic configuration. Parenchymal volume loss is commensurate with the patient's age. Moderate periventricular white matter changes are present likely reflecting the sequela of small vessel ischemia.  Focal encephalomalacia within the posterior right  temporal lobe is in keeping with a remote posterior MCA division cortical infarct. No abnormal intra or extra-axial mass lesion or fluid collection. No abnormal mass effect or midline shift. No evidence of acute intracranial hemorrhage or infarct. Ventricular size is normal. Cerebellum unremarkable. Vascular: No asymmetric hyperdense vasculature at the skull base. Skull: Intact Sinuses/Orbits: There is dense opacification of the left frontal sinus and several left ethmoid air cells. Remaining paranasal sinuses are clear. Orbits are unremarkable. Other: Mastoid air cells and middle ear cavities are clear. CT CERVICAL SPINE FINDINGS Alignment: Normal. Skull base and vertebrae: The craniocervical junction is unremarkable. Atlantodental interval is normal. No acute fracture of the cervical spine. No lytic or blastic bone lesions are seen. Soft tissues and spinal canal: No prevertebral fluid or swelling. No visible canal hematoma. Disc levels: Review of the sagittal reformats demonstrates preservation of vertebral body height. There is intervertebral disc space narrowing and endplate remodeling at C5-6 in keeping with changes of mild degenerative disc disease with small posteriorly oriented disc osteophytes noted at this level. Remaining intervertebral disc heights are preserved. Review of the axial images demonstrates multilevel uncovertebral and facet arthrosis. There is resultant mild to moderate bilateral neural foraminal narrowing at C3-4, mild bilateral neural foraminal narrowing at C4-5, moderate bilateral neural foraminal narrowing at C5-6 as well as mild central canal stenosis secondary to posterior disc osteophyte complex resulting in abutment and subtle flattening of the thecal sac, mild bilateral neural foraminal narrowing at C6-7. Upper chest: Negative. Other: None significant IMPRESSION: 1. No acute intracranial abnormality. 2. Remote posterior right MCA division cortical infarct. 3. Moderate  periventricular white matter changes likely reflecting the sequela of small vessel ischemia. 4. No acute fracture or malalignment of the cervical spine. 5. Mild degenerative disc disease at C5-6. Multilevel degenerative joint disease resulting in multilevel neural foraminal narrowing, as described above, and mild central canal stenosis at C5-6. 6. Dense opacification of the left frontal sinus and several left ethmoid air cells. Electronically Signed   By: Helyn Numbers MD   On: 10/28/2019 18:31   DG Pelvis Portable  Result Date: 10/28/2019 CLINICAL DATA:  Fall, pelvic pain EXAM: PORTABLE PELVIS 1-2 VIEWS COMPARISON:  None. FINDINGS: There is no evidence of pelvic fracture or diastasis. No pelvic bone lesions are seen. IMPRESSION: Negative. Electronically Signed   By: Helyn Numbers MD   On: 10/28/2019 18:18   DG Chest Port 1 View  Result Date: 10/28/2019 CLINICAL DATA:  Fall, mid back pain EXAM: PORTABLE CHEST 1 VIEW COMPARISON:  07/02/2018 FINDINGS: Mild eventration of the right hemidiaphragm is unchanged. Lungs are clear. No pneumothorax or pleural effusion. Cardiac size within normal limits. Coronary artery stenting has been performed. Pulmonary vascularity is normal. No acute bone abnormality. IMPRESSION: No active disease. Electronically Signed   By: Helyn Numbers MD   On: 10/28/2019 18:17   DG Knee Left Port  Result Date: 10/28/2019 CLINICAL DATA:  Fall, left knee pain EXAM: PORTABLE LEFT KNEE - 1-2 VIEW COMPARISON:  None. FINDINGS: Four view radiograph left knee demonstrates normal alignment. No fracture or dislocation. Joint spaces are preserved. No effusion. Vascular calcifications are seen within the posterior soft tissues. IMPRESSION: No acute fracture or dislocation. Electronically Signed   By: Helyn Numbers MD   On: 10/28/2019 18:19    Assessment and Plan:   Ms. Messineo presents with an episode of syncope on a backdrop coronary artery disease, paroxysmal atrial fibrillation, and prior  presyncopal and  syncopal episodes.  She has no evidence of significant cardiac conduction disease, ventricular arrhythmias, or major structural heart disease including valvular disease.  Diagnosis of exclusion in her case would be some degree of longstanding orthostatic hypotension exacerbated by mild dehydration.  Recommend observation on telemetry and echocardiogram in the morning.  She should receive surveillance imaging for head injury on anticoagulation.  Given that she previously had sinus pauses on telemetry during previous admission, it would be reasonable to discharge her with an ambulatory heart rhythm monitor.  1. Syncope -Telemetry monitoring -Holter on discharge -Echo in the morning  2.  Paroxysmal atrial fibrillation -If surveillance imaging is normal here, resume anticoagulation for stroke prevention  [facility] will sign off.   Medication Recommendations:  Continue home medications  Follow up as an outpatient:  With Dr. Tomie China   For questions or updates, please contact [facility] Please consult [email or web address] for contact info under    Signed, Cynda Acres, MD  10/28/2019 8:17 PM

## 2019-10-29 ENCOUNTER — Observation Stay (HOSPITAL_BASED_OUTPATIENT_CLINIC_OR_DEPARTMENT_OTHER): Payer: Medicare Other

## 2019-10-29 ENCOUNTER — Observation Stay (HOSPITAL_COMMUNITY): Payer: Medicare Other

## 2019-10-29 DIAGNOSIS — R55 Syncope and collapse: Secondary | ICD-10-CM | POA: Diagnosis not present

## 2019-10-29 DIAGNOSIS — G40909 Epilepsy, unspecified, not intractable, without status epilepticus: Secondary | ICD-10-CM | POA: Diagnosis not present

## 2019-10-29 DIAGNOSIS — I480 Paroxysmal atrial fibrillation: Secondary | ICD-10-CM | POA: Diagnosis not present

## 2019-10-29 DIAGNOSIS — R569 Unspecified convulsions: Secondary | ICD-10-CM

## 2019-10-29 DIAGNOSIS — E119 Type 2 diabetes mellitus without complications: Secondary | ICD-10-CM | POA: Diagnosis not present

## 2019-10-29 LAB — GLUCOSE, CAPILLARY
Glucose-Capillary: 133 mg/dL — ABNORMAL HIGH (ref 70–99)
Glucose-Capillary: 135 mg/dL — ABNORMAL HIGH (ref 70–99)
Glucose-Capillary: 142 mg/dL — ABNORMAL HIGH (ref 70–99)
Glucose-Capillary: 142 mg/dL — ABNORMAL HIGH (ref 70–99)
Glucose-Capillary: 143 mg/dL — ABNORMAL HIGH (ref 70–99)
Glucose-Capillary: 157 mg/dL — ABNORMAL HIGH (ref 70–99)
Glucose-Capillary: 181 mg/dL — ABNORMAL HIGH (ref 70–99)

## 2019-10-29 LAB — ECHOCARDIOGRAM COMPLETE
Height: 64 in
S' Lateral: 2.5 cm
Weight: 1968 oz

## 2019-10-29 LAB — COMPREHENSIVE METABOLIC PANEL
ALT: 26 U/L (ref 0–44)
AST: 40 U/L (ref 15–41)
Albumin: 3.7 g/dL (ref 3.5–5.0)
Alkaline Phosphatase: 60 U/L (ref 38–126)
Anion gap: 10 (ref 5–15)
BUN: 12 mg/dL (ref 8–23)
CO2: 24 mmol/L (ref 22–32)
Calcium: 8.9 mg/dL (ref 8.9–10.3)
Chloride: 102 mmol/L (ref 98–111)
Creatinine, Ser: 0.54 mg/dL (ref 0.44–1.00)
GFR calc Af Amer: >60 mL/min (ref >60)
GFR calc non Af Amer: >60 mL/min (ref >60)
Glucose, Bld: 129 mg/dL — ABNORMAL HIGH (ref 70–99)
Potassium: 3.5 mmol/L (ref 3.5–5.1)
Sodium: 136 mmol/L (ref 135–145)
Total Bilirubin: 1 mg/dL (ref 0.3–1.2)
Total Protein: 6.6 g/dL (ref 6.5–8.1)

## 2019-10-29 LAB — CBC WITH DIFFERENTIAL/PLATELET
Abs Immature Granulocytes: 0.03 10*3/uL (ref 0.00–0.07)
Basophils Absolute: 0 10*3/uL (ref 0.0–0.1)
Basophils Relative: 0 %
Eosinophils Absolute: 0.1 10*3/uL (ref 0.0–0.5)
Eosinophils Relative: 1 %
HCT: 31.7 % — ABNORMAL LOW (ref 36.0–46.0)
Hemoglobin: 10.6 g/dL — ABNORMAL LOW (ref 12.0–15.0)
Immature Granulocytes: 0 %
Lymphocytes Relative: 17 %
Lymphs Abs: 1.4 10*3/uL (ref 0.7–4.0)
MCH: 30 pg (ref 26.0–34.0)
MCHC: 33.4 g/dL (ref 30.0–36.0)
MCV: 89.8 fL (ref 80.0–100.0)
Monocytes Absolute: 0.5 10*3/uL (ref 0.1–1.0)
Monocytes Relative: 7 %
Neutro Abs: 6 10*3/uL (ref 1.7–7.7)
Neutrophils Relative %: 75 %
Platelets: 160 10*3/uL (ref 150–400)
RBC: 3.53 MIL/uL — ABNORMAL LOW (ref 3.87–5.11)
RDW: 14.3 % (ref 11.5–15.5)
WBC: 8.1 10*3/uL (ref 4.0–10.5)
nRBC: 0 % (ref 0.0–0.2)

## 2019-10-29 LAB — MAGNESIUM: Magnesium: 1.6 mg/dL — ABNORMAL LOW (ref 1.7–2.4)

## 2019-10-29 LAB — T4, FREE: Free T4: 1 ng/dL (ref 0.61–1.12)

## 2019-10-29 LAB — PHOSPHORUS: Phosphorus: 3.6 mg/dL (ref 2.5–4.6)

## 2019-10-29 MED ORDER — LEVETIRACETAM 250 MG PO TABS
250.0000 mg | ORAL_TABLET | Freq: Every day | ORAL | Status: DC
  Administered 2019-10-29 – 2019-10-30 (×2): 250 mg via ORAL
  Filled 2019-10-29 (×2): qty 1

## 2019-10-29 MED ORDER — SODIUM CHLORIDE 0.9% FLUSH
3.0000 mL | Freq: Two times a day (BID) | INTRAVENOUS | Status: DC
  Administered 2019-10-29 (×2): 3 mL via INTRAVENOUS

## 2019-10-29 MED ORDER — CLOPIDOGREL BISULFATE 75 MG PO TABS
75.0000 mg | ORAL_TABLET | Freq: Every day | ORAL | Status: DC
  Administered 2019-10-29 – 2019-10-30 (×2): 75 mg via ORAL
  Filled 2019-10-29 (×2): qty 1

## 2019-10-29 MED ORDER — MAGNESIUM SULFATE 2 GM/50ML IV SOLN
2.0000 g | Freq: Once | INTRAVENOUS | Status: AC
  Administered 2019-10-29: 2 g via INTRAVENOUS
  Filled 2019-10-29: qty 50

## 2019-10-29 MED ORDER — ACETAMINOPHEN 650 MG RE SUPP: 650.0000 mg | Freq: Four times a day (QID) | RECTAL | Status: DC | PRN

## 2019-10-29 MED ORDER — ATORVASTATIN CALCIUM 40 MG PO TABS
40.0000 mg | ORAL_TABLET | Freq: Every day | ORAL | Status: DC
  Administered 2019-10-29 – 2019-10-30 (×2): 40 mg via ORAL
  Filled 2019-10-29 (×2): qty 1

## 2019-10-29 MED ORDER — POLYVINYL ALCOHOL 1.4 % OP SOLN
1.0000 [drp] | OPHTHALMIC | Status: DC | PRN
  Filled 2019-10-29: qty 15

## 2019-10-29 MED ORDER — LISINOPRIL 40 MG PO TABS
40.0000 mg | ORAL_TABLET | Freq: Every day | ORAL | Status: DC
  Administered 2019-10-29 – 2019-10-30 (×2): 40 mg via ORAL
  Filled 2019-10-29 (×2): qty 1

## 2019-10-29 MED ORDER — SODIUM CHLORIDE 0.9 % IV SOLN: INTRAVENOUS | Status: AC

## 2019-10-29 MED ORDER — BACITRACIN ZINC 500 UNIT/GM EX OINT
TOPICAL_OINTMENT | Freq: Two times a day (BID) | CUTANEOUS | Status: DC
  Administered 2019-10-29 (×2): 1 via TOPICAL
  Filled 2019-10-29: qty 28.4

## 2019-10-29 MED ORDER — LEVOTHYROXINE SODIUM 75 MCG PO TABS
75.0000 ug | ORAL_TABLET | Freq: Every day | ORAL | Status: DC
  Administered 2019-10-29 – 2019-10-30 (×2): 75 ug via ORAL
  Filled 2019-10-29 (×2): qty 1

## 2019-10-29 MED ORDER — EZETIMIBE 10 MG PO TABS
10.0000 mg | ORAL_TABLET | Freq: Every day | ORAL | Status: DC
  Administered 2019-10-29 – 2019-10-30 (×2): 10 mg via ORAL
  Filled 2019-10-29 (×2): qty 1

## 2019-10-29 MED ORDER — LEVETIRACETAM 250 MG PO TABS
500.0000 mg | ORAL_TABLET | Freq: Every day | ORAL | Status: DC
  Administered 2019-10-29 (×2): 500 mg via ORAL
  Filled 2019-10-29 (×2): qty 2

## 2019-10-29 MED ORDER — HYDROCODONE-ACETAMINOPHEN 5-325 MG PO TABS
1.0000 | ORAL_TABLET | ORAL | Status: DC | PRN
  Administered 2019-10-29: 1 via ORAL
  Filled 2019-10-29: qty 1

## 2019-10-29 MED ORDER — FAMOTIDINE 20 MG PO TABS
40.0000 mg | ORAL_TABLET | Freq: Every day | ORAL | Status: DC
  Administered 2019-10-29 – 2019-10-30 (×2): 40 mg via ORAL
  Filled 2019-10-29 (×2): qty 2

## 2019-10-29 MED ORDER — ACETAMINOPHEN 325 MG PO TABS: 650.0000 mg | ORAL_TABLET | Freq: Four times a day (QID) | ORAL | Status: DC | PRN

## 2019-10-29 MED ORDER — RIVAROXABAN 20 MG PO TABS
20.0000 mg | ORAL_TABLET | Freq: Every day | ORAL | Status: DC
  Administered 2019-10-29: 20 mg via ORAL
  Filled 2019-10-29: qty 1

## 2019-10-29 NOTE — Discharge Summary (Signed)
Physician Discharge Summary  Elaine Mathews WUJ:[id number] DOB: [birth year] DOA: 10/28/2019  PCP: Lucianne Lei, MD  Admit date: 10/28/2019 Discharge date: 10/29/2019  Recommendations for Outpatient Follow-up:  1. Follow-up syncope.  Consider outpatient Holter monitor.  Deferred to PCP and primary cardiologist 2. Home health physical therapy, wheelchair   Follow-up Information    Lucianne Lei, MD. Schedule an appointment as soon as possible for a visit in 1 week(s).   Specialty: Internal Medicine Contact information: [address] [place]  [address] [phone]        [facility] Follow up.   Why: Wheelchair to be delivered to the room prior to transition home.  Contact information: [address] [place] [address] [phone]        [facility], [facility] Follow up.   Specialty: Home Health Services Why: Physical Therapy-office to call with a visit time.  Contact information: [address] [place] [address] [phone]                Discharge Diagnoses: Principal diagnosis is #1 Principal Problem:   Syncope, vasovagal Active Problems:   Syncope and collapse   DM2 (diabetes mellitus, type 2) (HCC)   Seizure disorder (HCC)   Diarrhea   CAD in native artery   Essential hypertension   AF (paroxysmal atrial fibrillation) (HCC)   Chronic diastolic CHF (congestive heart failure) (HCC)   Dementia without behavioral disturbance (HCC)   Hypothyroidism   Fatty liver   Type 2 diabetes mellitus with hyperlipidemia (HCC)   History of stroke   Prolonged QT interval   Abrasion, knee, left, initial encounter   Discharge Condition: improved Disposition: home  Diet recommendation: heart healthy, diabetic diet  Filed Weights   10/28/19 1726 10/29/19 0012  Weight: 56.2 kg 55.8 kg    History of present illness/Hospital course:  86yow PMH CAD, seizures, afib on DOAC presented after syncope at home, hit head on  toilet. Had second syncopal episode w/ EMS. Had diarrhea and nausea at home. Felt lightheaded. Admitted for syncope  PMH of syncope 07/2018 found to have NSTEMI, had DES to LAD then and was placed on Plavix. She had a seizure light episode as an inpatient was started on Keppra.    Patient was seen by cardiology, recommendation was for Holter monitor on discharge and follow-up echocardiogram.  Work-up was reassuring.  History most suggestive of vasovagal syncope.  Seen by physical therapy with recommendation for home health and wheelchair.  Patient agreeable to this.  No further inpatient recommendations.  Syncope --vasovagal by history. CT head negative. Given h/o seizures, EEG was checked and given cardiac history cardiology was consulted. --EEG mild diffuse encephalopathy, nonspecific, no seizures.  --no evidence of significant cardiac conduction disease, ventricular arrhythmias, or major structural heart disease including valvular disease.  --echo reassuring --given h/o sinus pauses previous admit > holter on discharge recommended.  Patient not sure she wants to do this.  I have asked her to follow-up with her PCP and Dr. Tomie China her cardiologist  Seizure disorder on Keppra --EEG unrevealing.  Continue Keppra.  CAD --stable, continue  On Aspirin, statin, Plavix  - followed by cardiology - last cardiac cath June 2020 sp stent  Chronic diastolic CHF --Lasix held; dry on admission; may have contributed to syncope  DM type 2, HgbA1c 6.8 --Stable CBG   Hypothyroidism, on Synthroid --TSH slightly high, no change now will continue levothyroxine at current dose  Diarrhea --Probably contributed to dehydration.  Atrial fibrillation.  CHA2DS2 vas score 6 Continue anticoagulation with Xarelto  Continue diltiazem  Today's assessment: S: feels good, ready to go home Saw a helper out the door then went to bathroom at home, was lightheaded she thinks and passed  out O: Vitals:  Vitals:   10/29/19 1204 10/29/19 1740  BP: (!) 154/67 (!) 157/67  Pulse: 83 86  Resp: 18 18  Temp: 98.7 F (37.1 C) 98 F (36.7 C)  SpO2: 96% 98%    Constitutional:  . Appears calm and comfortable Eyes:  . Grossly unremarkable . EOMI ENMT:  . grossly normal hearing  . Right sided significant ecchymosis periorbital and over the face.  Left-sided facial swelling and ecchymosis noted. Respiratory:  . CTA bilaterally, no w/r/r.  . Respiratory effort normal.  Cardiovascular:  . RRR, no m/r/g . No LE extremity edema   Musculoskeletal:  . RUE, LUE, RLE, LLE   o strength and tone grossly normal, no atrophy, no abnormal movements Skin:  . As above Neurologic:  . Grossly nonfocal Psychiatric:  . judgement and insight appear normal . Mental status o Mood, affect appropriate  CBG stable, BMP unremarkable, LFTs unremarkable.  Phosphorus within normal limits.  Magnesium slightly low at 1.6.  CBC unremarkable.  CT head, 2D echocardiogram, bilateral carotid ultrasound unrevealing.  EEG nonspecific.  EKG sinus rhythm with right bundle branch block, no old for comparison.  Discharge Instructions  Discharge Instructions    Diet - low sodium heart healthy   Complete by: As directed    Diet Carb Modified   Complete by: As directed    Discharge instructions   Complete by: As directed    Call your physician or seek immediate medical attention for pain, falls, passing out or worsening of condition.   Discharge wound care:   Complete by: As directed    Wound care  Daily      Comments: Wound care to left knee skin tears:  Cleanse with NS, pat dry. Cover with folded xeroform gauze, top with dry gauze 2x2s, secure with a few turns of Kerlix roll gauze/paper tape. Change twice daily.  Comments: Wound care to chronic, nonhealing wound (full thickness) on left front leg:  clean and pat dry. Paint with betadine swabstick and allow to air-dry. No dressing. Wound care to facial  abrasions:  Cleanse and pat dry. Apply a thin layer of bacitracin ointment, no dressing.   Increase activity slowly   Complete by: As directed      Allergies as of 10/29/2019      Reactions   Codeine Other (See Comments)   Possibly a rash   Other Diarrhea   Reaction to canteloupe   Penicillins Rash      Medication List    TAKE these medications   Artificial Tears 1.4 % ophthalmic solution Generic drug: polyvinyl alcohol Place 1 drop into both eyes as needed for dry eyes.   atorvastatin 40 MG tablet Commonly known as: LIPITOR Take 40 mg by mouth every evening.   clopidogrel 75 MG tablet Commonly known as: PLAVIX Take 75 mg by mouth daily.   diltiazem 180 MG 24 hr capsule Commonly known as: DILACOR XR Take 180 mg by mouth every evening.   doxycycline 100 MG capsule Commonly known as: MONODOX Take 100-200 mg by mouth See admin instructions. Take one capsule (100 mg) by mouth twice on 1st day  and then take one capsule (100 mg) daily for 9 days.   ezetimibe 10 MG tablet Commonly  known as: ZETIA Take 10 mg by mouth every evening.   famotidine 40 MG tablet Commonly known as: PEPCID Take 40 mg by mouth daily.   Florajen3 Caps Take 1 capsule by mouth daily.   furosemide 40 MG tablet Commonly known as: LASIX Take 40 mg by mouth 2 (two) times daily.   Glucosamine Chondroitin Triple Tabs Take 2 tablets by mouth every evening.   levETIRAcetam 250 MG tablet Commonly known as: KEPPRA Take 250-500 mg by mouth See admin instructions. Take one tablet (250 mg) by mouth every morning and two tablets (500 mg) every evening   levothyroxine 75 MCG tablet Commonly known as: SYNTHROID Take 75 mcg by mouth daily before breakfast.   lisinopril 40 MG tablet Commonly known as: ZESTRIL Take 40 mg by mouth every evening.   memantine 10 MG tablet Commonly known as: NAMENDA Take 10 mg by mouth 2 (two) times daily.   metFORMIN 500 MG tablet Commonly known as: GLUCOPHAGE Take  250 mg by mouth 2 (two) times daily.   montelukast 10 MG tablet Commonly known as: SINGULAIR Take 10 mg by mouth every evening.   potassium chloride 10 MEQ CR capsule Commonly known as: MICRO-K Take 10 mEq by mouth daily.   PRENATAL VITAMIN PO Take 1 tablet by mouth daily.   rivaroxaban 20 MG Tabs tablet Commonly known as: XARELTO Take 20 mg by mouth every evening.   sertraline 50 MG tablet Commonly known as: ZOLOFT Take 50 mg by mouth daily.   sodium chloride 1 g tablet Take 1 g by mouth daily.   Vitamin D (Ergocalciferol) 1.25 MG (50000 UNIT) Caps capsule Commonly known as: DRISDOL Take 50,000 Units by mouth every Wednesday.            Durable Medical Equipment  (From admission, onward)         Start     Ordered   10/29/19 1612  For home use only DME lightweight manual wheelchair with seat cushion  Once       Comments: Patient suffers from gait instability which impairs their ability to perform daily activities like feeding and grooming in the home.  A cane or walker will not resolve  issue with performing activities of daily living. A wheelchair will allow patient to safely perform daily activities. Patient is not able to propel themselves in the home using a standard weight wheelchair due to endurance and general weakness. Patient can self propel in the lightweight wheelchair. Length of need Lifetime. Accessories: elevating leg rests (ELRs), wheel locks, extensions and anti-tippers.   10/29/19 1612           Discharge Care Instructions  (From admission, onward)         Start     Ordered   10/29/19 0000  Discharge wound care:       Comments: Wound care  Daily      Comments: Wound care to left knee skin tears:  Cleanse with NS, pat dry. Cover with folded xeroform gauze, top with dry gauze 2x2s, secure with a few turns of Kerlix roll gauze/paper tape. Change twice daily.  Comments: Wound care to chronic, nonhealing wound (full thickness) on left front leg:   clean and pat dry. Paint with betadine swabstick and allow to air-dry. No dressing. Wound care to facial abrasions:  Cleanse and pat dry. Apply a thin layer of bacitracin ointment, no dressing.   10/29/19 1610         Allergies  Allergen Reactions  .  Codeine Other (See Comments)    Possibly a rash  . Other Diarrhea    Reaction to canteloupe  . Penicillins Rash    The results of significant diagnostics from this hospitalization (including imaging, microbiology, ancillary and laboratory) are listed below for reference.    Significant Diagnostic Studies: DG Thoracic Spine 2 View  Result Date: 10/28/2019 CLINICAL DATA:  Back pain EXAM: THORACIC SPINE 2 VIEWS COMPARISON:  None. FINDINGS: There is no evidence of thoracic spine fracture. Alignment is normal. Mild degenerative changes seen in the midthoracic spine with small anterior osteophytes. IMPRESSION: No definite acute fracture. Electronically Signed   By: Jonna Clark M.D.   On: 10/28/2019 19:15   DG Lumbar Spine 2-3 Views  Result Date: 10/28/2019 CLINICAL DATA:  Back pain EXAM: LUMBAR SPINE - 2-3 VIEW COMPARISON:  None. FINDINGS: There is no evidence of lumbar spine fracture. Alignment is normal. Mild disc height loss with facet arthrosis seen in the lower lumbar spine. Scattered dense vascular calcifications are noted. IMPRESSION: No definite osseous fracture. Electronically Signed   By: Jonna Clark M.D.   On: 10/28/2019 19:17   DG Abd 1 View  Result Date: 10/28/2019 CLINICAL DATA:  Fall, diarrhea EXAM: ABDOMEN - 1 VIEW COMPARISON:  07/13/2018 FINDINGS: 2 supine frontal views of the abdomen and pelvis demonstrate an unremarkable bowel gas pattern. No masses or abnormal calcifications. No acute bony abnormalities. Multilevel lumbar spondylosis unchanged. IMPRESSION: 1. Unremarkable bowel gas pattern. Electronically Signed   By: Sharlet Salina M.D.   On: 10/28/2019 21:50   CT HEAD WO CONTRAST  Result Date: 10/28/2019 CLINICAL DATA:   Syncope, fall, head injury, neck trauma EXAM: CT HEAD WITHOUT CONTRAST CT CERVICAL SPINE WITHOUT CONTRAST TECHNIQUE: Multidetector CT imaging of the head and cervical spine was performed following the standard protocol without intravenous contrast. Multiplanar CT image reconstructions of the cervical spine were also generated. COMPARISON:  None. FINDINGS: CT HEAD FINDINGS Brain: Normal anatomic configuration. Parenchymal volume loss is commensurate with the patient's age. Moderate periventricular white matter changes are present likely reflecting the sequela of small vessel ischemia. Focal encephalomalacia within the posterior right temporal lobe is in keeping with a remote posterior MCA division cortical infarct. No abnormal intra or extra-axial mass lesion or fluid collection. No abnormal mass effect or midline shift. No evidence of acute intracranial hemorrhage or infarct. Ventricular size is normal. Cerebellum unremarkable. Vascular: No asymmetric hyperdense vasculature at the skull base. Skull: Intact Sinuses/Orbits: There is dense opacification of the left frontal sinus and several left ethmoid air cells. Remaining paranasal sinuses are clear. Orbits are unremarkable. Other: Mastoid air cells and middle ear cavities are clear. CT CERVICAL SPINE FINDINGS Alignment: Normal. Skull base and vertebrae: The craniocervical junction is unremarkable. Atlantodental interval is normal. No acute fracture of the cervical spine. No lytic or blastic bone lesions are seen. Soft tissues and spinal canal: No prevertebral fluid or swelling. No visible canal hematoma. Disc levels: Review of the sagittal reformats demonstrates preservation of vertebral body height. There is intervertebral disc space narrowing and endplate remodeling at C5-6 in keeping with changes of mild degenerative disc disease with small posteriorly oriented disc osteophytes noted at this level. Remaining intervertebral disc heights are preserved. Review of  the axial images demonstrates multilevel uncovertebral and facet arthrosis. There is resultant mild to moderate bilateral neural foraminal narrowing at C3-4, mild bilateral neural foraminal narrowing at C4-5, moderate bilateral neural foraminal narrowing at C5-6 as well as mild central canal stenosis secondary to posterior disc  osteophyte complex resulting in abutment and subtle flattening of the thecal sac, mild bilateral neural foraminal narrowing at C6-7. Upper chest: Negative. Other: None significant IMPRESSION: 1. No acute intracranial abnormality. 2. Remote posterior right MCA division cortical infarct. 3. Moderate periventricular white matter changes likely reflecting the sequela of small vessel ischemia. 4. No acute fracture or malalignment of the cervical spine. 5. Mild degenerative disc disease at C5-6. Multilevel degenerative joint disease resulting in multilevel neural foraminal narrowing, as described above, and mild central canal stenosis at C5-6. 6. Dense opacification of the left frontal sinus and several left ethmoid air cells. Electronically Signed   By: Helyn Numbers MD   On: 10/28/2019 18:31   CT CERVICAL SPINE WO CONTRAST  Result Date: 10/28/2019 CLINICAL DATA:  Syncope, fall, head injury, neck trauma EXAM: CT HEAD WITHOUT CONTRAST CT CERVICAL SPINE WITHOUT CONTRAST TECHNIQUE: Multidetector CT imaging of the head and cervical spine was performed following the standard protocol without intravenous contrast. Multiplanar CT image reconstructions of the cervical spine were also generated. COMPARISON:  None. FINDINGS: CT HEAD FINDINGS Brain: Normal anatomic configuration. Parenchymal volume loss is commensurate with the patient's age. Moderate periventricular white matter changes are present likely reflecting the sequela of small vessel ischemia. Focal encephalomalacia within the posterior right temporal lobe is in keeping with a remote posterior MCA division cortical infarct. No abnormal intra  or extra-axial mass lesion or fluid collection. No abnormal mass effect or midline shift. No evidence of acute intracranial hemorrhage or infarct. Ventricular size is normal. Cerebellum unremarkable. Vascular: No asymmetric hyperdense vasculature at the skull base. Skull: Intact Sinuses/Orbits: There is dense opacification of the left frontal sinus and several left ethmoid air cells. Remaining paranasal sinuses are clear. Orbits are unremarkable. Other: Mastoid air cells and middle ear cavities are clear. CT CERVICAL SPINE FINDINGS Alignment: Normal. Skull base and vertebrae: The craniocervical junction is unremarkable. Atlantodental interval is normal. No acute fracture of the cervical spine. No lytic or blastic bone lesions are seen. Soft tissues and spinal canal: No prevertebral fluid or swelling. No visible canal hematoma. Disc levels: Review of the sagittal reformats demonstrates preservation of vertebral body height. There is intervertebral disc space narrowing and endplate remodeling at C5-6 in keeping with changes of mild degenerative disc disease with small posteriorly oriented disc osteophytes noted at this level. Remaining intervertebral disc heights are preserved. Review of the axial images demonstrates multilevel uncovertebral and facet arthrosis. There is resultant mild to moderate bilateral neural foraminal narrowing at C3-4, mild bilateral neural foraminal narrowing at C4-5, moderate bilateral neural foraminal narrowing at C5-6 as well as mild central canal stenosis secondary to posterior disc osteophyte complex resulting in abutment and subtle flattening of the thecal sac, mild bilateral neural foraminal narrowing at C6-7. Upper chest: Negative. Other: None significant IMPRESSION: 1. No acute intracranial abnormality. 2. Remote posterior right MCA division cortical infarct. 3. Moderate periventricular white matter changes likely reflecting the sequela of small vessel ischemia. 4. No acute fracture  or malalignment of the cervical spine. 5. Mild degenerative disc disease at C5-6. Multilevel degenerative joint disease resulting in multilevel neural foraminal narrowing, as described above, and mild central canal stenosis at C5-6. 6. Dense opacification of the left frontal sinus and several left ethmoid air cells. Electronically Signed   By: Helyn Numbers MD   On: 10/28/2019 18:31   DG Pelvis Portable  Result Date: 10/28/2019 CLINICAL DATA:  Fall, pelvic pain EXAM: PORTABLE PELVIS 1-2 VIEWS COMPARISON:  None. FINDINGS: There is no  evidence of pelvic fracture or diastasis. No pelvic bone lesions are seen. IMPRESSION: Negative. Electronically Signed   By: Helyn Numbers MD   On: 10/28/2019 18:18   DG Chest Port 1 View  Result Date: 10/28/2019 CLINICAL DATA:  Fall, mid back pain EXAM: PORTABLE CHEST 1 VIEW COMPARISON:  07/02/2018 FINDINGS: Mild eventration of the right hemidiaphragm is unchanged. Lungs are clear. No pneumothorax or pleural effusion. Cardiac size within normal limits. Coronary artery stenting has been performed. Pulmonary vascularity is normal. No acute bone abnormality. IMPRESSION: No active disease. Electronically Signed   By: Helyn Numbers MD   On: 10/28/2019 18:17   DG Knee Left Port  Result Date: 10/28/2019 CLINICAL DATA:  Fall, left knee pain EXAM: PORTABLE LEFT KNEE - 1-2 VIEW COMPARISON:  None. FINDINGS: Four view radiograph left knee demonstrates normal alignment. No fracture or dislocation. Joint spaces are preserved. No effusion. Vascular calcifications are seen within the posterior soft tissues. IMPRESSION: No acute fracture or dislocation. Electronically Signed   By: Helyn Numbers MD   On: 10/28/2019 18:19   EEG adult  Result Date: 10/29/2019 Charlsie Quest, MD     10/29/2019  2:54 PM Patient Name: AZALEAH ALARID MRN: [id number] Epilepsy Attending: Charlsie Quest Referring Physician/Provider: Dr Lana Fish Date: 10/29/2019 Duration: 24.41 mins Patient history:  86yo F with syncope. EEG to evaluate for seizure Level of alertness: Awake AEDs during EEG study: LEV Technical aspects: This EEG study was done with scalp electrodes positioned according to the 10-20 International system of electrode placement. Electrical activity was acquired at a sampling rate of 500Hz  and reviewed with a high frequency filter of 70Hz  and a low frequency filter of 1Hz . EEG data were recorded continuously and digitally stored. Description: The posterior dominant rhythm consists of 8-9 Hz activity of moderate voltage (25-35 uV) seen predominantly in posterior head regions, symmetric and reactive to eye opening and eye closing. EEG showed continuous generalized 3 to 6 Hz theta-delta slowing. Hyperventilation and photic stimulation were not performed.   ABNORMALITY -Continuous slow, generalized IMPRESSION: This study is suggestive of mild diffuse encephalopathy, nonspecific etiology. No seizures or epileptiform discharges were seen throughout the recording. Charlsie Quest   ECHOCARDIOGRAM COMPLETE  Result Date: 10/29/2019    ECHOCARDIOGRAM REPORT   Patient Name:   SUKHMAN OKRAY Date of Exam: 10/29/2019 Medical Rec #:  [id number]     Height:       64.0 in Accession #:    [id number]    Weight:       123.0 lb Date of Birth:  [birth year]     BSA:          1.591 m Patient Age:    86 years      BP:           149/67 mmHg Patient Gender: F             HR:           89 bpm. Exam Location:  Inpatient Procedure: 2D Echo Indications:    syncope 780.2  History:        Patient has no prior history of Echocardiogram examinations.                 CHF, CAD, Arrythmias:Atrial Fibrillation,                 Signs/Symptoms:Syncope; Risk Factors:Diabetes and Hypertension.  Sonographer:    Delcie Roch Referring Phys: [id number] ANASTASSIA DOUTOVA IMPRESSIONS  1. Left  ventricular ejection fraction, by estimation, is 70 to 75%. The left ventricle has hyperdynamic function. The left ventricle has no regional wall motion  abnormalities. Left ventricular diastolic parameters are consistent with Grade I diastolic dysfunction (impaired relaxation).  2. Right ventricular systolic function is normal. The right ventricular size is normal. There is normal pulmonary artery systolic pressure. The estimated right ventricular systolic pressure is 33.0 mmHg.  3. A small pericardial effusion is present. The pericardial effusion is circumferential. There is no evidence of cardiac tamponade.  4. The mitral valve is degenerative. Trivial mitral valve regurgitation. No evidence of mitral stenosis.  5. The aortic valve is tricuspid. There is mild calcification of the aortic valve. There is mild thickening of the aortic valve. Aortic valve regurgitation is not visualized. Mild aortic valve sclerosis is present, with no evidence of aortic valve stenosis.  6. The inferior vena cava is normal in size with greater than 50% respiratory variability, suggesting right atrial pressure of 3 mmHg. FINDINGS  Left Ventricle: Left ventricular ejection fraction, by estimation, is 70 to 75%. The left ventricle has hyperdynamic function. The left ventricle has no regional wall motion abnormalities. The left ventricular internal cavity size was normal in size. There is no left ventricular hypertrophy. Left ventricular diastolic parameters are consistent with Grade I diastolic dysfunction (impaired relaxation). Right Ventricle: The right ventricular size is normal. No increase in right ventricular wall thickness. Right ventricular systolic function is normal. There is normal pulmonary artery systolic pressure. The tricuspid regurgitant velocity is 2.74 m/s, and  with an assumed right atrial pressure of 3 mmHg, the estimated right ventricular systolic pressure is 33.0 mmHg. Left Atrium: Left atrial size was normal in size. Right Atrium: Right atrial size was normal in size. Pericardium: A small pericardial effusion is present. The pericardial effusion is circumferential.  There is no evidence of cardiac tamponade. Presence of pericardial fat pad. Mitral Valve: The mitral valve is degenerative in appearance. Mild mitral annular calcification. Trivial mitral valve regurgitation. No evidence of mitral valve stenosis. Tricuspid Valve: The tricuspid valve is grossly normal. Tricuspid valve regurgitation is trivial. No evidence of tricuspid stenosis. Aortic Valve: The aortic valve is tricuspid. There is mild calcification of the aortic valve. There is mild thickening of the aortic valve. Aortic valve regurgitation is not visualized. Mild aortic valve sclerosis is present, with no evidence of aortic valve stenosis. Pulmonic Valve: The pulmonic valve was grossly normal. Pulmonic valve regurgitation is not visualized. No evidence of pulmonic stenosis. Aorta: The aortic root and ascending aorta are structurally normal, with no evidence of dilitation. Venous: The inferior vena cava is normal in size with greater than 50% respiratory variability, suggesting right atrial pressure of 3 mmHg. IAS/Shunts: The interatrial septum appears to be lipomatous. The atrial septum is grossly normal.  LEFT VENTRICLE PLAX 2D LVIDd:         4.20 cm  Diastology LVIDs:         2.50 cm  LV e' medial:    6.85 cm/s LV PW:         1.00 cm  LV E/e' medial:  13.4 LV IVS:        1.00 cm  LV e' lateral:   5.33 cm/s LVOT diam:     1.70 cm  LV E/e' lateral: 17.3 LV SV:         47 LV SV Index:   30 LVOT Area:     2.27 cm  RIGHT VENTRICLE  IVC RV S prime:     12.20 cm/s  IVC diam: 1.50 cm TAPSE (M-mode): 1.8 cm LEFT ATRIUM             Index       RIGHT ATRIUM          Index LA diam:        3.20 cm 2.01 cm/m  RA Area:     9.49 cm LA Vol (A2C):   30.9 ml 19.42 ml/m RA Volume:   19.70 ml 12.38 ml/m LA Vol (A4C):   36.8 ml 23.13 ml/m LA Biplane Vol: 37.0 ml 23.25 ml/m  AORTIC VALVE LVOT Vmax:   90.70 cm/s LVOT Vmean:  61.000 cm/s LVOT VTI:    0.209 m  AORTA Ao Root diam: 2.50 cm Ao Asc diam:  3.20 cm MV E  velocity: 92.00 cm/s   TRICUSPID VALVE MV A velocity: 148.00 cm/s  TR Peak grad:   30.0 mmHg MV E/A ratio:  0.62         TR Vmax:        274.00 cm/s                              SHUNTS                             Systemic VTI:  0.21 m                             Systemic Diam: 1.70 cm Lennie Odor MD Electronically signed by Lennie Odor MD Signature Date/Time: 10/29/2019/10:32:33 AM    Final    VAS US CAROTID  Result Date: 10/29/2019 Carotid Arterial Duplex Study Indications:       Syncope. Risk Factors:      Hypertension, Diabetes. Comparison Study:  No prior studies. Performing Technologist: Chanda Busing RVT  Examination Guidelines: A complete evaluation includes B-mode imaging, spectral Doppler, color Doppler, and power Doppler as needed of all accessible portions of each vessel. Bilateral testing is considered an integral part of a complete examination. Limited examinations for reoccurring indications may be performed as noted.  Right Carotid Findings: +----------+--------+--------+--------+-----------------------+--------+           PSV cm/sEDV cm/sStenosisPlaque Description     Comments +----------+--------+--------+--------+-----------------------+--------+ CCA Prox  64      15              smooth and heterogenous         +----------+--------+--------+--------+-----------------------+--------+ CCA Distal73      15                                              +----------+--------+--------+--------+-----------------------+--------+ ICA Prox  136     41              calcific                        +----------+--------+--------+--------+-----------------------+--------+ ICA Distal99      26                                              +----------+--------+--------+--------+-----------------------+--------+  ECA       124                                                     +----------+--------+--------+--------+-----------------------+--------+  +----------+--------+-------+--------+-------------------+           PSV cm/sEDV cmsDescribeArm Pressure (mmHG) +----------+--------+-------+--------+-------------------+ [id number]                                         +----------+--------+-------+--------+-------------------+ +---------+--------+--+--------+-+---------+ VertebralPSV cm/s35EDV cm/s5Antegrade +---------+--------+--+--------+-+---------+  Left Carotid Findings: +----------+--------+--------+--------+-----------------------+--------+           PSV cm/sEDV cm/sStenosisPlaque Description     Comments +----------+--------+--------+--------+-----------------------+--------+ CCA Prox  95      17              smooth and heterogenous         +----------+--------+--------+--------+-----------------------+--------+ CCA Distal125     35              smooth and heterogenous         +----------+--------+--------+--------+-----------------------+--------+ ICA Prox  129     31              calcific                        +----------+--------+--------+--------+-----------------------+--------+ ICA Distal105     26                                              +----------+--------+--------+--------+-----------------------+--------+ ECA       224     18                                              +----------+--------+--------+--------+-----------------------+--------+ +----------+--------+--------+--------+-------------------+           PSV cm/sEDV cm/sDescribeArm Pressure (mmHG) +----------+--------+--------+--------+-------------------+ [id number]                                         +----------+--------+--------+--------+-------------------+ +---------+--------+--+--------+-+---------+ VertebralPSV cm/s38EDV cm/s5Antegrade +---------+--------+--+--------+-+---------+   Summary: Right Carotid: Velocities in the right ICA are consistent with a 40-59%                stenosis.  Left Carotid: Velocities in the left ICA are consistent with a 1-39% stenosis. Vertebrals: Bilateral vertebral arteries demonstrate antegrade flow. *See table(s) above for measurements and observations.  Electronically signed by Lemar Livings MD on 10/29/2019 at 11:56:50 AM.    Final     Microbiology: Recent Results (from the past 240 hour(s))  SARS Coronavirus 2 by RT PCR (hospital order, performed in [facility] hospital lab) Nasopharyngeal Nasopharyngeal Swab     Status: None   Collection Time: 10/28/19  8:47 PM   Specimen: Nasopharyngeal Swab  Result Value Ref Range Status   SARS Coronavirus 2 NEGATIVE NEGATIVE Final    Comment: (NOTE) SARS-CoV-2 target nucleic acids are NOT DETECTED.  The SARS-CoV-2 RNA is generally detectable in upper  and lower respiratory specimens during the acute phase of infection. The lowest concentration of SARS-CoV-2 viral copies this assay can detect is 250 copies / mL. A negative result does not preclude SARS-CoV-2 infection and should not be used as the sole basis for treatment or other patient management decisions.  A negative result may occur with improper specimen collection / handling, submission of specimen other than nasopharyngeal swab, presence of viral mutation(s) within the areas targeted by this assay, and inadequate number of viral copies (<250 copies / mL). A negative result must be combined with clinical observations, patient history, and epidemiological information.  Fact Sheet for Patients:   BoilerBrush.com.cy  Fact Sheet for Healthcare Providers: https://pope.com/  This test is not yet approved or  cleared by the [place] FDA and has been authorized for detection and/or diagnosis of SARS-CoV-2 by FDA under an Emergency Use Authorization (EUA).  This EUA will remain in effect (meaning this test can be used) for the duration of the COVID-19 declaration under Section 564(b)(1) of the  Act, 21 U.S.C. section 360bbb-3(b)(1), unless the authorization is terminated or revoked sooner.  Performed at [facility], [address] [place], [place] [address]      Labs: Basic Metabolic Panel: Recent Labs  Lab 10/28/19 1727 10/28/19 1740 10/28/19 2251 10/29/19 0316  NA 138 137  --  136  K 3.6 3.8  --  3.5  CL 103 100  --  102  CO2 22  --   --  24  GLUCOSE 184* 183*  --  129*  BUN 16 23  --  12  CREATININE 0.79 0.70  --  0.54  CALCIUM 9.3  --   --  8.9  MG  --   --  1.5* 1.6*  PHOS  --   --  4.0 3.6   Liver Function Tests: Recent Labs  Lab 10/28/19 1727 10/29/19 0316  AST 50* 40  ALT 29 26  ALKPHOS 63 60  BILITOT 0.7 1.0  PROT 7.2 6.6  ALBUMIN 4.1 3.7   CBC: Recent Labs  Lab 10/28/19 1727 10/28/19 1740 10/29/19 0316  WBC 6.5  --  8.1  NEUTROABS  --   --  6.0  HGB 11.3* 11.2* 10.6*  HCT 35.4* 33.0* 31.7*  MCV 92.4  --  89.8  PLT 164  --  160   Cardiac Enzymes: Recent Labs  Lab 10/28/19 2251  CKTOTAL 165    Recent Labs    10/28/19 1729  BNP 114.6*     CBG: Recent Labs  Lab 10/29/19 0040 10/29/19 0413 10/29/19 0740 10/29/19 1203 10/29/19 1621  GLUCAP 157* 133* 142* 142* 181*    Principal Problem:   Syncope, vasovagal Active Problems:   Syncope and collapse   DM2 (diabetes mellitus, type 2) (HCC)   Seizure disorder (HCC)   Diarrhea   CAD in native artery   Essential hypertension   AF (paroxysmal atrial fibrillation) (HCC)   Chronic diastolic CHF (congestive heart failure) (HCC)   Dementia without behavioral disturbance (HCC)   Hypothyroidism   Fatty liver   Type 2 diabetes mellitus with hyperlipidemia (HCC)   History of stroke   Prolonged QT interval   Abrasion, knee, left, initial encounter   Time coordinating discharge: 40 minutes  Signed:  Brendia Sacks, MD  [facility]  10/29/2019, 7:14 PM

## 2019-10-29 NOTE — TOC Initial Note (Signed)
Transition of Care [facility]) - Initial/Assessment Note    Patient Details  Name: Elaine Mathews MRN: [id number] Date of Birth: [birth year]  Transition of Care [facility]) CM/SW Contact:    Gala Lewandowsky, RN Phone Number: 10/29/2019, 4:59 PM  Clinical Narrative: Patient presented from home for syncopal episode. Patient has support of her son. Patient gave Case Manager verbal permission to call son [name]. Case Manager spoke with [name] and he is agreeable to have home health services come into the home. Medicare.gov list provided to patient and family agreeable to [facility]. Referral made to liaison and start of care to begin within 24-48 hours post transition of care. Per liaison it would probably be Monday start. Patient in need of durable medical equipment lightweight wheelchair. Family agreeable to [employer] for DME. Wheelchair to be delivered to room prior to transition home. Son was notified that patient will transition home today. Son expressed that he did not realize that patient would transition home today-no one had notified him. Son will provide transportation home via private vehicle. No further needs from Case Manager at this time.                Expected Discharge Plan: Home w Home Health Services Barriers to Discharge: No Barriers Identified   Patient Goals and CMS Choice Patient states their goals for this hospitalization and ongoing recovery are:: to return home CMS Medicare.gov Compare Post Acute Care list provided to:: Patient Choice offered to / list presented to : Patient  Expected Discharge Plan and Services Expected Discharge Plan: Home w Home Health Services In-house Referral: NA Discharge Planning Services: CM Consult Post Acute Care Choice: Durable Medical Equipment, Home Health Living arrangements for the past 2 months: Apartment Expected Discharge Date: 10/29/19               DME Arranged: High strength lightweight manual wheelchair with seat cushion DME Agency:  [facility] Date DME Agency Contacted: 10/29/19 Time DME Agency Contacted: 1645 Representative spoke with at DME Agency: Cassie HH Arranged: PT HH Agency: [facility] Date [facility] Agency Contacted: 10/29/19 Time HH Agency Contacted: 1659 Representative spoke with at [facility] Agency: Kandee Keen  Prior Living Arrangements/Services Living arrangements for the past 2 months: Apartment Lives with:: Adult Children Patient language and need for interpreter reviewed:: Yes Do you feel safe going back to the place where you live?: Yes      Need for Family Participation in Patient Care: Yes (Comment) Care giver support system in place?: Yes (comment)   Criminal Activity/Legal Involvement Pertinent to Current Situation/Hospitalization: No - Comment as needed  Activities of Daily Living      Permission Sought/Granted Permission sought to share information with : Facility Medical sales representative, Sports coach, Family Supports Permission granted to share information with : Yes, Verbal Permission Granted     Permission granted to share info w AGENCY: [employer], [facility]        Emotional Assessment Appearance:: Appears stated age   Affect (typically observed): Appropriate Orientation: : Oriented to Self, Oriented to  Time, Oriented to Situation, Oriented to Place Alcohol / Substance Use: Not Applicable Psych Involvement: No (comment)  Admission diagnosis:  Syncope and collapse [R55] Diarrhea [R19.7] Fall [W19.XXXA] Patient Active Problem List   Diagnosis Date Noted  . Syncope and collapse 10/28/2019  . DM2 (diabetes mellitus, type 2) (HCC) 10/28/2019  . Seizure disorder (HCC) 10/28/2019  . Diarrhea 10/28/2019  . Syncope, vasovagal 10/28/2019  . CAD in native artery 10/28/2019  . Essential  hypertension 10/28/2019  . AF (paroxysmal atrial fibrillation) (HCC) 10/28/2019  . Chronic diastolic CHF (congestive heart failure) (HCC) 10/28/2019  . Dementia without behavioral disturbance (HCC)  10/28/2019  . Hypothyroidism 10/28/2019  . Fatty liver 10/28/2019  . Type 2 diabetes mellitus with hyperlipidemia (HCC) 10/28/2019  . History of stroke 10/28/2019  . Prolonged QT interval 10/28/2019  . Abrasion, knee, left, initial encounter 10/28/2019   PCP:  Lucianne Lei, MD Pharmacy:   [facility] - [place], [place] - [address] [place] [address] Phone: [phone] Fax: [phone]   Readmission Risk Interventions No flowsheet data found.

## 2019-10-29 NOTE — Progress Notes (Signed)
Carotid artery duplex has been completed. Preliminary results can be found in CV Proc through chart review.   10/29/19 11:29 AM Olen Cordial RVT

## 2019-10-29 NOTE — Progress Notes (Signed)
OT Cancellation Note  Patient Details Name: Elaine Mathews MRN: [id number] DOB: 1933-09-25   Cancelled Treatment:    Reason Eval/Treat Not Completed: Patient at procedure or test/ unavailable  Pt having EEG at time OT checked on. Will check back later this day or next day Elaine Mathews, [place] Acute Rehabilitation Services Pager- [phone] Office- [phone]     Elaine Mathews, [place] [facility] 10/29/2019, 2:06 PM

## 2019-10-29 NOTE — Progress Notes (Signed)
EEG complete - results pending 

## 2019-10-29 NOTE — Consult Note (Signed)
WOC Nurse Consult Note: Reason for Consult: Abrasions from fall.  Skin tears to left knee.  Chronic wound on left pretibial area, full thickness, dry (from trauma: patient bumped leg on walker). Wound type: Trauma Pressure Injury POA: N/A Measurement: Scattered abrasions to face (right eye, right jaw, left jaw, cheek), dry with ecchymosis, erythema, no drainage Left LE, pretibial area: 2cm x 1.4cm area of dried eschar. So surrounding erythema, drainage. Left knee with two (2) skin tears), ISTAP Classification Type 2 (Partial flap loss), the larger of which is slightly proximal to the other and measures 3cm x 2cm x 0.1cm. Red, moist wound bed with no exudate on either. Wound bed:As noted above Drainage (amount, consistency, odor) As noted above Periwound:As noted above. Dressing procedure/placement/frequency: I have provided Nursing with guidance for the care of this lovely patient's wounds including an antimicrobial nonadherent to the left knee skin tears, bacitracin to the facial abrasions and painting the chronic nonhealing wound on the left anterior LE with a betadine swabstick twice daily. A sacral prophylactic foam dressing is placed to prevent pressure injury and a pressure redistribution chair pad is provided for her use both here and at home while OOB in the chair.    I discussed the POC with her Bedside RN, Merla Riches, who is a wound treatment associate (WTA.)  WOC nursing team will not follow, but will remain available to this patient, the nursing and medical teams.  Please re-consult if needed. Thanks, Ladona Mow, MSN, RN, GNP, [facility] [place]  Pager# [phone]

## 2019-10-29 NOTE — Evaluation (Signed)
Physical Therapy Evaluation Patient Details Name: Elaine Mathews MRN: [id number] DOB: 1933/04/25 Today's Date: 10/29/2019   History of Present Illness  Elaine Mathews is a 86 y.o. female with a hx of NSTEMI, hypertension, hyperlipidemia, and paroxysmal atrial fibrillation who is being seen today for the evaluation of syncope at the request of Dr Adela Glimpse.  Clinical Impression  Pt admitted with above diagnosis. Pt was able to ambulate with RW with min to min guard assist.  No LOB with RW.  Pt states she feels unsteady at times.  Pt with loose BM and states she wears Depends for this at home.  Pt does feel she needs a wheelchair for community mobility and this PT agrees.  Pt shuold progress well.  Pt currently with functional limitations due to the deficits listed below (see PT Problem List). Pt will benefit from skilled PT to increase their independence and safety with mobility to allow discharge to the venue listed below.      Follow Up Recommendations Home health PT    Equipment Recommendations  Wheelchair [phone] lightweight with desk armrests, elevating legrests and anti tippers);Wheelchair cushion (18x16 pressure relieving cushion)    Recommendations for Other Services       Precautions / Restrictions Precautions Precautions: Fall Restrictions Weight Bearing Restrictions: No      Mobility  Bed Mobility Overal bed mobility: Needs Assistance Bed Mobility: Supine to Sit     Supine to sit: Min assist     General bed mobility comments: Pt needed a little assist to come to eOB  Transfers Overall transfer level: Needs assistance Equipment used: Rolling walker (2 wheeled) Transfers: Sit to/from [employer] Sit to Stand: Min guard;Min assist;From elevated surface Stand pivot transfers: Min assist       General transfer comment: Pt needed min assist to steady once up.  Asked to pivot to the 3N1 as she needed to have BM and pt was able to take pivotal steps.    Ambulation/Gait Ambulation/Gait assistance: Min assist Gait Distance (Feet): 175 Feet Assistive device: Rolling walker (2 wheeled) Gait Pattern/deviations: Step-through pattern;Decreased stride length;Trunk flexed   Gait velocity interpretation: <1.31 ft/sec, indicative of household ambulator General Gait Details: Pt was able to ambulate with RW and with good stability. Pt felt steadier with RW than without and does have a walker at home.  Pt asked to use 3N1 again upon return to room and this PT assisted her to 3N1 again.   Stairs            Wheelchair Mobility    Modified Rankin (Stroke Patients Only)       Balance Overall balance assessment: Needs assistance Sitting-balance support: No upper extremity supported;Feet supported Sitting balance-Leahy Scale: Fair     Standing balance support: Bilateral upper extremity supported;During functional activity Standing balance-Leahy Scale: Poor Standing balance comment: Pt relies on UE support for balance.                              Pertinent Vitals/Pain Pain Assessment: No/denies pain    Home Living Family/patient expects to be discharged to:: Private residence Living Arrangements: Alone Available Help at Discharge: Family;Available PRN/intermittently;Personal care attendant (caregiver 8-4, 5 days week; Sun 8-2) Type of Home: Apartment Home Access: Stairs to enter Entrance Stairs-Rails: Right Entrance Stairs-Number of Steps: 1+1 Home Layout: One level Home Equipment: Walker - 4 wheels;Bedside commode;Shower seat;Grab bars - tub/shower;Hand held shower head  Prior Function Level of Independence: Independent with assistive device(s)         Comments: USed rollator at all time outdoors and sometimes indoors, B/D self, caregiver is there to supervise     Hand Dominance   Dominant Hand: Right    Extremity/Trunk Assessment   Upper Extremity Assessment Upper Extremity Assessment: Defer to OT  evaluation    Lower Extremity Assessment Lower Extremity Assessment: Generalized weakness    Cervical / Trunk Assessment Cervical / Trunk Assessment: Normal  Communication   Communication: No difficulties  Cognition Arousal/Alertness: Awake/alert Behavior During Therapy: WFL for tasks assessed/performed Overall Cognitive Status: Within Functional Limits for tasks assessed                                        General Comments      Exercises     Assessment/Plan    PT Assessment Patient needs continued PT services  PT Problem List Decreased activity tolerance;Decreased balance;Decreased mobility;Decreased knowledge of use of DME;Decreased safety awareness;Decreased knowledge of precautions       PT Treatment Interventions DME instruction;Gait training;Functional mobility training;Therapeutic activities;Therapeutic exercise;Balance training;Patient/family education;Stair training    PT Goals (Current goals can be found in the Care Plan section)  Acute Rehab PT Goals Patient Stated Goal: to go home PT Goal Formulation: With patient Time For Goal Achievement: 11/12/19 Potential to Achieve Goals: Good    Frequency Min 3X/week   Barriers to discharge        Co-evaluation               AM-PAC PT "6 Clicks" Mobility  Outcome Measure Help needed turning from your back to your side while in a flat bed without using bedrails?: A Little Help needed moving from lying on your back to sitting on the side of a flat bed without using bedrails?: A Little Help needed moving to and from a bed to a chair (including a wheelchair)?: A Little Help needed standing up from a chair using your arms (e.g., wheelchair or bedside chair)?: A Little Help needed to walk in hospital room?: A Little Help needed climbing 3-5 steps with a railing? : A Little 6 Click Score: 18    End of Session Equipment Utilized During Treatment: Gait belt Activity Tolerance: Patient  limited by fatigue Patient left: in chair;with call bell/phone within reach;with chair alarm set Nurse Communication: Mobility status PT Visit Diagnosis: Unsteadiness on feet (R26.81);Muscle weakness (generalized) (M62.81)    Time: 1005-1035 PT Time Calculation (min) (ACUTE ONLY): 30 min   Charges:   PT Evaluation $PT Eval Moderate Complexity: 1 Mod PT Treatments $Gait Training: 8-22 mins        Ephram Kornegay W,PT Acute Rehabilitation Services Pager:  [phone]  Office:  [phone]    Berline Lopes 10/29/2019, 1:57 PM

## 2019-10-29 NOTE — Hospital Course (Addendum)
86yow PMH CAD, seizures, afib on DOAC presented after syncope at home, hit head on toilet. Had second syncopal episode w/ EMS. Had diarrhea and nausea at home. Felt lightheaded. Admitted for syncope  PMH of syncope 07/2018 found to have NSTEMI, had DES to LAD then and was placed on Plavix. She had a seizure light episode as an inpatient was started on Keppra.

## 2019-10-29 NOTE — Progress Notes (Signed)
Patient son said he will not be able to pick his Mom up until tomorrow morning, MD notified. Will continue to monitor the patient.

## 2019-10-29 NOTE — Care Management (Signed)
    Durable Medical Equipment  (From admission, onward)         Start     Ordered   10/29/19 1612  For home use only DME lightweight manual wheelchair with seat cushion  Once       Comments: Patient suffers from gait instability which impairs their ability to perform daily activities like feeding and grooming in the home.  A cane or walker will not resolve  issue with performing activities of daily living. A wheelchair will allow patient to safely perform daily activities. Patient is not able to propel themselves in the home using a standard weight wheelchair due to endurance and general weakness. Patient can self propel in the lightweight wheelchair. Length of need Lifetime. Accessories: elevating leg rests (ELRs), wheel locks, extensions and anti-tippers.   10/29/19 1612

## 2019-10-29 NOTE — Progress Notes (Signed)
  Echocardiogram 2D Echocardiogram has been performed.  Delcie Roch 10/29/2019, 8:53 AM

## 2019-10-29 NOTE — Procedures (Signed)
Patient Name: ANUJA MANKA  MRN: [id number]  Epilepsy Attending: Charlsie Quest  Referring Physician/Provider: Dr Lana Fish  Date: 10/29/2019 Duration: 24.41 mins  Patient history: 87yo F with syncope. EEG to evaluate for seizure  Level of alertness: Awake  AEDs during EEG study: LEV  Technical aspects: This EEG study was done with scalp electrodes positioned according to the 10-20 International system of electrode placement. Electrical activity was acquired at a sampling rate of 500Hz  and reviewed with a high frequency filter of 70Hz  and a low frequency filter of 1Hz . EEG data were recorded continuously and digitally stored.   Description: The posterior dominant rhythm consists of 8-9 Hz activity of moderate voltage (25-35 uV) seen predominantly in posterior head regions, symmetric and reactive to eye opening and eye closing. EEG showed continuous generalized 3 to 6 Hz theta-delta slowing. Hyperventilation and photic stimulation were not performed.     ABNORMALITY -Continuous slow, generalized  IMPRESSION: This study is suggestive of mild diffuse encephalopathy, nonspecific etiology. No seizures or epileptiform discharges were seen throughout the recording.  Hettie Roselli 

## 2019-10-30 DIAGNOSIS — R55 Syncope and collapse: Secondary | ICD-10-CM | POA: Diagnosis not present

## 2019-10-30 LAB — GLUCOSE, CAPILLARY
Glucose-Capillary: 151 mg/dL — ABNORMAL HIGH (ref 70–99)
Glucose-Capillary: 144 mg/dL — ABNORMAL HIGH (ref 70–99)

## 2019-10-30 LAB — T3: T3, Total: 88 ng/dL (ref 71–180)

## 2019-10-30 NOTE — Evaluation (Signed)
Occupational Therapy Evaluation Patient Details Name: Elaine Mathews MRN: [id number] DOB: Jan 10, 1934 Today's Date: 10/30/2019    History of Present Illness Elaine Mathews is a 86 y.o. female with a hx of NSTEMI, hypertension, hyperlipidemia, and paroxysmal atrial fibrillation who is being seen today for the evaluation of syncope at the request of Dr Adela Glimpse.   Clinical Impression   Pt focused on going home today and reaching her son to communicate what time. Pt demonstrating ability to mobilize with min guard to min assist and perform ADL with set up to min guard assist. Pt likely very close to her baseline. Pt has assistance for ADL and IADL at home. No further OT needs.     Follow Up Recommendations  No OT follow up    Equipment Recommendations  None recommended by OT    Recommendations for Other Services       Precautions / Restrictions Precautions Precautions: Fall      Mobility Bed Mobility Overal bed mobility: Needs Assistance Bed Mobility: Supine to Sit     Supine to sit: Min assist     General bed mobility comments: min assist to raise trunk  Transfers Overall transfer level: Needs assistance Equipment used: 1 person hand held assist Transfers: Sit to/from Stand;Stand Pivot Transfers Sit to Stand: Min guard;Min assist Stand pivot transfers: Min guard;Min assist       General transfer comment: transferred bed>3 in1 >chair    Balance Overall balance assessment: Needs assistance   Sitting balance-Leahy Scale: Fair     Standing balance support: Single extremity supported Standing balance-Leahy Scale: Poor Standing balance comment: dependent on at least one hand support                           ADL either performed or assessed with clinical judgement   ADL Overall ADL's : Needs assistance/impaired Eating/Feeding: Independent   Grooming: Wash/dry hands;Wash/dry face;Sitting;Set up   Upper Body Bathing: Set up;Sitting   Lower Body  Bathing: Min guard;Sit to/from stand   Upper Body Dressing : Set up;Sitting   Lower Body Dressing: Min guard;Sit to/from stand   Toilet Transfer: Min guard;Stand-pivot;BSC   Toileting- Architect and Hygiene: Set up;Sitting/lateral lean               Vision Baseline Vision/History: No visual deficits       Perception     Praxis      Pertinent Vitals/Pain Pain Assessment: No/denies pain     Hand Dominance Right   Extremity/Trunk Assessment Upper Extremity Assessment Upper Extremity Assessment: Overall WFL for tasks assessed   Lower Extremity Assessment Lower Extremity Assessment: Defer to PT evaluation   Cervical / Trunk Assessment Cervical / Trunk Assessment: Normal   Communication Communication Communication: No difficulties   Cognition Arousal/Alertness: Awake/alert Behavior During Therapy: Anxious Overall Cognitive Status: No family/caregiver present to determine baseline cognitive functioning                                 General Comments: pt highly focused on plan for discharge today and contacting her son   General Comments       Exercises     Shoulder Instructions      Home Living Family/patient expects to be discharged to:: Private residence Living Arrangements: Alone Available Help at Discharge: Family;Available PRN/intermittently;Personal care attendant (caregiver 8-4 weekdays, 8-2 Sunday) Type of Home: Apartment Home Access:  Stairs to enter [employer] of Steps: 1+1 Entrance Stairs-Rails: Right Home Layout: One level     Bathroom Shower/Tub: Chief Strategy Officer: Standard     Home Equipment: Environmental consultant - 4 wheels;Bedside commode;Shower seat;Grab bars - tub/shower;Hand held shower head          Prior Functioning/Environment Level of Independence: Independent with assistive device(s)        Comments: USed rollator at all time outdoors and sometimes indoors, B/D self, caregiver is  there to supervise        OT Problem List:        OT Treatment/Interventions:      OT Goals(Current goals can be found in the care plan section) Acute Rehab OT Goals Patient Stated Goal: to go home  OT Frequency:     Barriers to D/C:            Co-evaluation              AM-PAC OT "6 Clicks" Daily Activity     Outcome Measure Help from another person eating meals?: None Help from another person taking care of personal grooming?: A Little Help from another person toileting, which includes using toliet, bedpan, or urinal?: A Little Help from another person bathing (including washing, rinsing, drying)?: A Little Help from another person to put on and taking off regular upper body clothing?: None Help from another person to put on and taking off regular lower body clothing?: A Little 6 Click Score: 20   End of Session Equipment Utilized During Treatment: Gait belt Nurse Communication: Other (comment) (wants to reach son about d/c time)  Activity Tolerance: Patient tolerated treatment well Patient left: in chair;with call bell/phone within reach;with chair alarm set;with nursing/sitter in room  OT Visit Diagnosis: Other abnormalities of gait and mobility (R26.89);History of falling (Z91.81);Muscle weakness (generalized) (M62.81)                Time: 8413-2440 OT Time Calculation (min): 23 min Charges:  OT General Charges $OT Visit: 1 Visit OT Evaluation $OT Eval Moderate Complexity: 1 Mod OT Treatments $Self Care/Home Management : 8-22 mins  Martie Round, OTR/L Acute Rehabilitation Services Pager: [phone] Office: [phone] Evern Bio 10/30/2019, 8:48 AM

## 2019-11-01 ENCOUNTER — Encounter: Payer: Self-pay | Admitting: Cardiology

## 2019-11-01 DIAGNOSIS — S81002S Unspecified open wound, left knee, sequela: Secondary | ICD-10-CM | POA: Diagnosis not present

## 2019-11-01 DIAGNOSIS — R600 Localized edema: Secondary | ICD-10-CM | POA: Diagnosis not present

## 2019-11-01 DIAGNOSIS — R55 Syncope and collapse: Secondary | ICD-10-CM | POA: Diagnosis not present

## 2019-11-01 DIAGNOSIS — L03116 Cellulitis of left lower limb: Secondary | ICD-10-CM | POA: Diagnosis not present

## 2019-11-01 DIAGNOSIS — Z09 Encounter for follow-up examination after completed treatment for conditions other than malignant neoplasm: Secondary | ICD-10-CM | POA: Diagnosis not present

## 2019-11-02 DIAGNOSIS — Z8673 Personal history of transient ischemic attack (TIA), and cerebral infarction without residual deficits: Secondary | ICD-10-CM | POA: Diagnosis not present

## 2019-11-02 DIAGNOSIS — I0981 Rheumatic heart failure: Secondary | ICD-10-CM | POA: Diagnosis not present

## 2019-11-02 DIAGNOSIS — I2510 Atherosclerotic heart disease of native coronary artery without angina pectoris: Secondary | ICD-10-CM | POA: Diagnosis not present

## 2019-11-02 DIAGNOSIS — I110 Hypertensive heart disease with heart failure: Secondary | ICD-10-CM | POA: Diagnosis not present

## 2019-11-02 DIAGNOSIS — Z9181 History of falling: Secondary | ICD-10-CM | POA: Diagnosis not present

## 2019-11-02 DIAGNOSIS — I252 Old myocardial infarction: Secondary | ICD-10-CM | POA: Diagnosis not present

## 2019-11-02 DIAGNOSIS — M2578 Osteophyte, vertebrae: Secondary | ICD-10-CM | POA: Diagnosis not present

## 2019-11-02 DIAGNOSIS — E119 Type 2 diabetes mellitus without complications: Secondary | ICD-10-CM | POA: Diagnosis not present

## 2019-11-02 DIAGNOSIS — M47814 Spondylosis without myelopathy or radiculopathy, thoracic region: Secondary | ICD-10-CM | POA: Diagnosis not present

## 2019-11-02 DIAGNOSIS — Z7902 Long term (current) use of antithrombotics/antiplatelets: Secondary | ICD-10-CM | POA: Diagnosis not present

## 2019-11-02 DIAGNOSIS — I4891 Unspecified atrial fibrillation: Secondary | ICD-10-CM | POA: Diagnosis not present

## 2019-11-02 DIAGNOSIS — M50322 Other cervical disc degeneration at C5-C6 level: Secondary | ICD-10-CM | POA: Diagnosis not present

## 2019-11-02 DIAGNOSIS — I5032 Chronic diastolic (congestive) heart failure: Secondary | ICD-10-CM | POA: Diagnosis not present

## 2019-11-02 DIAGNOSIS — Z7984 Long term (current) use of oral hypoglycemic drugs: Secondary | ICD-10-CM | POA: Diagnosis not present

## 2019-11-02 DIAGNOSIS — K760 Fatty (change of) liver, not elsewhere classified: Secondary | ICD-10-CM | POA: Diagnosis not present

## 2019-11-02 DIAGNOSIS — E039 Hypothyroidism, unspecified: Secondary | ICD-10-CM | POA: Diagnosis not present

## 2019-11-02 DIAGNOSIS — Z955 Presence of coronary angioplasty implant and graft: Secondary | ICD-10-CM | POA: Diagnosis not present

## 2019-11-02 DIAGNOSIS — M4802 Spinal stenosis, cervical region: Secondary | ICD-10-CM | POA: Diagnosis not present

## 2019-11-02 DIAGNOSIS — I083 Combined rheumatic disorders of mitral, aortic and tricuspid valves: Secondary | ICD-10-CM | POA: Diagnosis not present

## 2019-11-02 DIAGNOSIS — M47812 Spondylosis without myelopathy or radiculopathy, cervical region: Secondary | ICD-10-CM | POA: Diagnosis not present

## 2019-11-02 DIAGNOSIS — S81002D Unspecified open wound, left knee, subsequent encounter: Secondary | ICD-10-CM | POA: Diagnosis not present

## 2019-11-02 DIAGNOSIS — Z7901 Long term (current) use of anticoagulants: Secondary | ICD-10-CM | POA: Diagnosis not present

## 2019-11-02 DIAGNOSIS — M47816 Spondylosis without myelopathy or radiculopathy, lumbar region: Secondary | ICD-10-CM | POA: Diagnosis not present

## 2019-11-15 DIAGNOSIS — M7989 Other specified soft tissue disorders: Secondary | ICD-10-CM | POA: Diagnosis not present

## 2019-11-15 DIAGNOSIS — G709 Myoneural disorder, unspecified: Secondary | ICD-10-CM | POA: Insufficient documentation

## 2019-11-15 DIAGNOSIS — H269 Unspecified cataract: Secondary | ICD-10-CM | POA: Insufficient documentation

## 2019-11-15 DIAGNOSIS — I499 Cardiac arrhythmia, unspecified: Secondary | ICD-10-CM | POA: Insufficient documentation

## 2019-11-15 DIAGNOSIS — I509 Heart failure, unspecified: Secondary | ICD-10-CM | POA: Insufficient documentation

## 2019-11-15 DIAGNOSIS — E079 Disorder of thyroid, unspecified: Secondary | ICD-10-CM | POA: Insufficient documentation

## 2019-11-15 DIAGNOSIS — I4891 Unspecified atrial fibrillation: Secondary | ICD-10-CM | POA: Insufficient documentation

## 2019-11-15 DIAGNOSIS — E119 Type 2 diabetes mellitus without complications: Secondary | ICD-10-CM | POA: Insufficient documentation

## 2019-11-15 DIAGNOSIS — I10 Essential (primary) hypertension: Secondary | ICD-10-CM | POA: Insufficient documentation

## 2019-11-15 DIAGNOSIS — I2510 Atherosclerotic heart disease of native coronary artery without angina pectoris: Secondary | ICD-10-CM | POA: Insufficient documentation

## 2019-11-15 DIAGNOSIS — R270 Ataxia, unspecified: Secondary | ICD-10-CM | POA: Insufficient documentation

## 2019-11-15 DIAGNOSIS — I739 Peripheral vascular disease, unspecified: Secondary | ICD-10-CM | POA: Insufficient documentation

## 2019-11-15 DIAGNOSIS — R600 Localized edema: Secondary | ICD-10-CM | POA: Diagnosis not present

## 2019-11-15 DIAGNOSIS — E1142 Type 2 diabetes mellitus with diabetic polyneuropathy: Secondary | ICD-10-CM | POA: Diagnosis not present

## 2019-11-15 DIAGNOSIS — I951 Orthostatic hypotension: Secondary | ICD-10-CM | POA: Diagnosis not present

## 2019-11-15 DIAGNOSIS — I219 Acute myocardial infarction, unspecified: Secondary | ICD-10-CM | POA: Insufficient documentation

## 2019-11-15 DIAGNOSIS — J45909 Unspecified asthma, uncomplicated: Secondary | ICD-10-CM | POA: Insufficient documentation

## 2019-11-15 DIAGNOSIS — M79606 Pain in leg, unspecified: Secondary | ICD-10-CM | POA: Diagnosis not present

## 2019-11-16 ENCOUNTER — Telehealth: Payer: Self-pay | Admitting: Cardiology

## 2019-11-16 ENCOUNTER — Ambulatory Visit: Payer: Medicare Other | Admitting: Cardiology

## 2019-11-16 DIAGNOSIS — K219 Gastro-esophageal reflux disease without esophagitis: Secondary | ICD-10-CM | POA: Diagnosis not present

## 2019-11-16 DIAGNOSIS — Z8673 Personal history of transient ischemic attack (TIA), and cerebral infarction without residual deficits: Secondary | ICD-10-CM | POA: Diagnosis not present

## 2019-11-16 DIAGNOSIS — E1151 Type 2 diabetes mellitus with diabetic peripheral angiopathy without gangrene: Secondary | ICD-10-CM | POA: Diagnosis not present

## 2019-11-16 DIAGNOSIS — Z9181 History of falling: Secondary | ICD-10-CM | POA: Diagnosis not present

## 2019-11-16 DIAGNOSIS — E538 Deficiency of other specified B group vitamins: Secondary | ICD-10-CM | POA: Diagnosis not present

## 2019-11-16 DIAGNOSIS — E039 Hypothyroidism, unspecified: Secondary | ICD-10-CM | POA: Diagnosis not present

## 2019-11-16 DIAGNOSIS — E1142 Type 2 diabetes mellitus with diabetic polyneuropathy: Secondary | ICD-10-CM | POA: Diagnosis not present

## 2019-11-16 DIAGNOSIS — M159 Polyosteoarthritis, unspecified: Secondary | ICD-10-CM | POA: Diagnosis not present

## 2019-11-16 DIAGNOSIS — M5440 Lumbago with sciatica, unspecified side: Secondary | ICD-10-CM | POA: Diagnosis not present

## 2019-11-16 DIAGNOSIS — F32A Depression, unspecified: Secondary | ICD-10-CM | POA: Diagnosis not present

## 2019-11-16 DIAGNOSIS — Z87891 Personal history of nicotine dependence: Secondary | ICD-10-CM | POA: Diagnosis not present

## 2019-11-16 DIAGNOSIS — I10 Essential (primary) hypertension: Secondary | ICD-10-CM | POA: Diagnosis not present

## 2019-11-16 DIAGNOSIS — E559 Vitamin D deficiency, unspecified: Secondary | ICD-10-CM | POA: Diagnosis not present

## 2019-11-16 DIAGNOSIS — E782 Mixed hyperlipidemia: Secondary | ICD-10-CM | POA: Diagnosis not present

## 2019-11-16 DIAGNOSIS — Z7901 Long term (current) use of anticoagulants: Secondary | ICD-10-CM | POA: Diagnosis not present

## 2019-11-16 DIAGNOSIS — I480 Paroxysmal atrial fibrillation: Secondary | ICD-10-CM | POA: Diagnosis not present

## 2019-11-16 DIAGNOSIS — R296 Repeated falls: Secondary | ICD-10-CM | POA: Diagnosis not present

## 2019-11-16 DIAGNOSIS — Z7902 Long term (current) use of antithrombotics/antiplatelets: Secondary | ICD-10-CM | POA: Diagnosis not present

## 2019-11-16 DIAGNOSIS — Z7984 Long term (current) use of oral hypoglycemic drugs: Secondary | ICD-10-CM | POA: Diagnosis not present

## 2019-11-16 DIAGNOSIS — J45909 Unspecified asthma, uncomplicated: Secondary | ICD-10-CM | POA: Diagnosis not present

## 2019-11-16 DIAGNOSIS — K760 Fatty (change of) liver, not elsewhere classified: Secondary | ICD-10-CM | POA: Diagnosis not present

## 2019-11-16 NOTE — Telephone Encounter (Signed)
Appointment made for pt tos have a hospital FU. Son states pt needs a monitor.

## 2019-11-16 NOTE — Telephone Encounter (Signed)
° ° °  Elaine Mathews calling, he said they need help to put the heart monitor to the pt.

## 2019-11-17 ENCOUNTER — Ambulatory Visit (INDEPENDENT_AMBULATORY_CARE_PROVIDER_SITE_OTHER): Payer: Medicare Other

## 2019-11-17 ENCOUNTER — Ambulatory Visit (INDEPENDENT_AMBULATORY_CARE_PROVIDER_SITE_OTHER): Payer: Medicare Other | Admitting: Cardiology

## 2019-11-17 ENCOUNTER — Other Ambulatory Visit: Payer: Self-pay

## 2019-11-17 ENCOUNTER — Encounter: Payer: Self-pay | Admitting: Cardiology

## 2019-11-17 VITALS — BP 134/79 | HR 104 | Ht 64.0 in | Wt 117.2 lb

## 2019-11-17 DIAGNOSIS — E088 Diabetes mellitus due to underlying condition with unspecified complications: Secondary | ICD-10-CM

## 2019-11-17 DIAGNOSIS — I480 Paroxysmal atrial fibrillation: Secondary | ICD-10-CM

## 2019-11-17 DIAGNOSIS — I2510 Atherosclerotic heart disease of native coronary artery without angina pectoris: Secondary | ICD-10-CM

## 2019-11-17 DIAGNOSIS — R55 Syncope and collapse: Secondary | ICD-10-CM

## 2019-11-17 DIAGNOSIS — I10 Essential (primary) hypertension: Secondary | ICD-10-CM

## 2019-11-17 DIAGNOSIS — E1140 Type 2 diabetes mellitus with diabetic neuropathy, unspecified: Secondary | ICD-10-CM

## 2019-11-17 HISTORY — DX: Diabetes mellitus due to underlying condition with unspecified complications: E08.8

## 2019-11-17 NOTE — Progress Notes (Signed)
Cardiology Office Note:    Date:  11/17/2019   ID:  Elaine Mathews, DOB Jun 15, 1933, MRN [id number]  PCP:  Lucianne Lei, MD  Cardiologist:  Garwin Brothers, MD   Referring MD: Lucianne Lei, MD    ASSESSMENT:    1. Paroxysmal atrial fibrillation (HCC)   2. Essential hypertension   3. Coronary artery disease involving native coronary artery of native heart without angina pectoris   4. AF (paroxysmal atrial fibrillation) (HCC)   5. Syncope and collapse   6. Controlled type 2 diabetes with neuropathy (HCC)   7. Diabetes mellitus due to underlying condition with unspecified complications (HCC)    PLAN:    In order of problems listed above:  1. I discussed my findings with the patient at extensive length and following recommendations were made. 2. Paroxysmal atrial fibrillation:I discussed with the patient atrial fibrillation, disease process. Management and therapy including rate and rhythm control, anticoagulation benefits and potential risks were discussed extensively with the patient. Patient had multiple questions which were answered to patient's satisfaction. 3. Borderline blood pressure: For this reason patient was advised to stop diltiazem at the hospital and she has.  Son accompanies her for this visit and is very meticulous with medications. 4. Syncope and collapse: For this evaluation I would like to do a 1 month event monitor.  She is agreeable on this.  We want to make sure that there is no dysrhythmia causing her syncopal episodes. 5. Coronary artery disease: Stable.  Medical management. 6. Patient request blood work and will do all blood work today including IMA globin A1c. 7. Patient will be seen in follow-up appointment in 6 months or earlier if the patient has any concerns    Medication Adjustments/Labs and Tests Ordered: Current medicines are reviewed at length with the patient today.  Concerns regarding medicines are outlined above.  Orders Placed This Encounter   Procedures  . Basic metabolic panel  . CBC  . TSH  . Lipid panel  . Vitamin D (25 hydroxy)  . Hemoglobin A1c   No orders of the defined types were placed in this encounter.    No chief complaint on file.    History of Present Illness:    Elaine Mathews is a 86 y.o. female.  Patient has past medical history of essential hypertension, paroxysmal defibrillation coronary artery disease.  She was in the hospital recently for syncope and I reviewed those records.  There was no arrhythmia documented.  Patient had echocardiogram and carotid Dopplers done and the details are mentioned below.  They were unremarkable.  At the time of my evaluation, the patient is alert awake oriented and in no distress.  Past Medical History:  Diagnosis Date  . A-fib (HCC)   . Abrasion, knee, left, initial encounter 10/28/2019  . AF (paroxysmal atrial fibrillation) (HCC) 10/28/2019  . Allergic rhinosinusitis   . Arthritis   . Asthma   . Ataxia    With generalized weakness in legs  . CAD (coronary artery disease)   . CAD in native artery 10/28/2019  . Cataract, bilateral   . CHF (congestive heart failure) (HCC)   . Chronic diastolic CHF (congestive heart failure) (HCC) 10/28/2019  . Controlled type 2 diabetes with neuropathy (HCC) 01/06/2017  . Coronary artery disease   . Dementia without behavioral disturbance (HCC) 10/28/2019  . Diabetes mellitus without complication (HCC)   . Diarrhea 10/28/2019  . DM2 (diabetes mellitus, type 2) (HCC) 10/28/2019  . Dysrhythmia   .  Elevated LFTs 07/12/2018  . Elevated liver enzymes   . Essential hypertension 11/13/2017  . Fatty liver   . Foot lesion   . History of stroke 10/28/2019  . Hyperlipidemia   . Hypertension   . Hypothyroidism   . Leg pain, bilateral   . MI (myocardial infarction) (HCC)   . Mild asthma   . Mild vascular neurocognitive disorder (HCC) 11/13/2018  . Nail dystrophy 03/08/2019  . Neuromuscular disorder (HCC)   . NSTEMI (non-ST elevated  myocardial infarction) (HCC) 07/12/2018  . Paroxysmal atrial fibrillation (HCC) 11/13/2017  . Peripheral vascular disease (HCC)   . Pre-ulcerative calluses 01/06/2017  . Prolonged QT interval 10/28/2019  . PVD (peripheral vascular disease) (HCC)   . Seizure (HCC)   . Seizure disorder (HCC) 10/28/2019  . Sinus pause 07/13/2018  . Spleen hematoma without rupture of capsule, without open wound into cavity 07/15/2018  . Stroke [facility])    Right parietal  . Stroke (HCC)   . Syncope and collapse 10/28/2019  . Syncope, vasovagal 07/15/2018  . Thyroid disease   . Type 2 diabetes mellitus with hyperlipidemia (HCC) 10/28/2019  . Vitamin D deficiency     Past Surgical History:  Procedure Laterality Date  . APPENDECTOMY    . CATARACT EXTRACTION Bilateral   . CHOLECYSTECTOMY    . CORONARY ANGIOPLASTY    . CORONARY STENT INTERVENTION N/A 07/13/2018   Procedure: CORONARY STENT INTERVENTION;  Surgeon: Yvonne Kendall, MD;  Location: [facility] INVASIVE CV LAB;  Service: Cardiovascular;  Laterality: N/A;  . INTRAVASCULAR PRESSURE WIRE/FFR STUDY N/A 07/13/2018   Procedure: INTRAVASCULAR PRESSURE WIRE/FFR STUDY;  Surgeon: Yvonne Kendall, MD;  Location: [facility] INVASIVE CV LAB;  Service: Cardiovascular;  Laterality: N/A;  . LEFT HEART CATH AND CORONARY ANGIOGRAPHY N/A 07/13/2018   Procedure: LEFT HEART CATH AND CORONARY ANGIOGRAPHY;  Surgeon: Yvonne Kendall, MD;  Location: [facility] INVASIVE CV LAB;  Service: Cardiovascular;  Laterality: N/A;  . TONSILLECTOMY      Current Medications: Current Meds  Medication Sig  . acetaminophen (TYLENOL) 500 MG tablet Take 500 mg by mouth in the morning and at bedtime.  Marland Kitchen atorvastatin (LIPITOR) 40 MG tablet Take 1 tablet (40 mg total) by mouth daily at 6 PM.  . clopidogrel (PLAVIX) 75 MG tablet Take 1 tablet (75 mg total) by mouth daily with breakfast.  . ezetimibe (ZETIA) 10 MG tablet Take 10 mg by mouth every evening.   . famotidine (PEPCID) 40 MG tablet Take 40 mg by mouth daily.  .  fluticasone (FLONASE) 50 MCG/ACT nasal spray Place 2 sprays into both nostrils daily.  . Glucosamine-Chondroit-Vit C-Mn (GLUCOSAMINE 1500 COMPLEX PO) Take 1 capsule by mouth 3 (three) times a week.  Marland Kitchen glucose blood test strip 1 each by Other route as needed. Use as instructed  . levETIRAcetam (KEPPRA) 250 MG tablet TAKE 1 TABLET BY MOUTH EVERY MORNING ANDTAKE 2 TABLETS EVERY EVENING  . levothyroxine (SYNTHROID) 75 MCG tablet Take 75 mcg by mouth daily before breakfast.  . lisinopril (ZESTRIL) 40 MG tablet Take 1 tablet (40 mg total) by mouth daily.  . memantine (NAMENDA) 10 MG tablet Take 10 mg by mouth 2 (two) times daily.  . metFORMIN (GLUCOPHAGE) 500 MG tablet Take 250 mg by mouth 2 (two) times daily.   . Misc Natural Products (GLUCOSAMINE CHONDROITIN TRIPLE) TABS Take 2 tablets by mouth every evening.  . montelukast (SINGULAIR) 10 MG tablet Take 10 mg by mouth every evening.   . nitroGLYCERIN (NITROSTAT) 0.4 MG SL tablet Place 1  tablet (0.4 mg total) under the tongue every 5 (five) minutes as needed for chest pain.  . polyvinyl alcohol (ARTIFICIAL TEARS) 1.4 % ophthalmic solution Place 1 drop into both eyes as needed for dry eyes.  . potassium chloride (KLOR-CON) 10 MEQ tablet Take 1 tablet (10 mEq total) by mouth daily.  . Prenatal Vit-Fe Fumarate-FA (PRENATAL VITAMIN PO) Take 1 tablet by mouth daily.  . Probiotic Product [facility]) CAPS Take 1 capsule by mouth daily.  . rivaroxaban (XARELTO) 20 MG TABS tablet Take 1 tablet (20 mg total) by mouth daily with supper.  . sertraline (ZOLOFT) 50 MG tablet Take 50 mg by mouth daily.  . sodium chloride 1 g tablet Take 1 g by mouth daily.  . Vitamin D, Ergocalciferol, (DRISDOL) 1.25 MG (50000 UNIT) CAPS capsule Take 50,000 Units by mouth every Wednesday.     Allergies:   Codeine, Other, Codeine, Penicillins, and Penicillins   Social History   Socioeconomic History  . Marital status: Divorced    Spouse name: Not on file  . Number of  children: Not on file  . Years of education: Not on file  . Highest education level: Not on file  Occupational History  . Not on file  Tobacco Use  . Smoking status: Never Smoker  . Smokeless tobacco: Never Used  Vaping Use  . Vaping Use: Never used  Substance and Sexual Activity  . Alcohol use: Not Currently  . Drug use: No  . Sexual activity: Not Currently    Partners: Male  Other Topics Concern  . Not on file  Social History Narrative   ** Merged History Encounter **       Lives with  Highest level of edu-  Right handed    Social Determinants of Health   Financial Resource Strain:   . Difficulty of Paying Living Expenses: Not on file  Food Insecurity:   . Worried About Programme researcher, broadcasting/film/video in the Last Year: Not on file  . Ran Out of Food in the Last Year: Not on file  Transportation Needs:   . Lack of Transportation (Medical): Not on file  . Lack of Transportation (Non-Medical): Not on file  Physical Activity:   . Days of Exercise per Week: Not on file  . Minutes of Exercise per Session: Not on file  Stress:   . Feeling of Stress : Not on file  Social Connections:   . Frequency of Communication with Friends and Family: Not on file  . Frequency of Social Gatherings with Friends and Family: Not on file  . Attends Religious Services: Not on file  . Active Member of Clubs or Organizations: Not on file  . Attends Banker Meetings: Not on file  . Marital Status: Not on file     Family History: The patient's family history includes CAD in her father and another family member; COPD in her son; Diabetes in her mother; Heart attack in her mother and son; Hypercholesterolemia in her mother; Hypertension in her father and mother; Stroke in her father and mother.  ROS:   Please see the history of present illness.    All other systems reviewed and are negative.  EKGs/Labs/Other Studies Reviewed:    The following studies were reviewed today: Summary:   Right Carotid: Velocities in the right ICA are consistent with a 40-59%         stenosis.   Left Carotid: Velocities in the left ICA are consistent with a 1-39%  stenosis.  Vertebrals: Bilateral vertebral arteries demonstrate antegrade flow.   *See table(s) above for measurements and observations.      Electronically signed by Lemar Livings MD on 10/29/2019 at 11:56:50 AM.    IMPRESSIONS    1. Left ventricular ejection fraction, by estimation, is 70 to 75%. The  left ventricle has hyperdynamic function. The left ventricle has no  regional wall motion abnormalities. Left ventricular diastolic parameters  are consistent with Grade I diastolic  dysfunction (impaired relaxation).  2. Right ventricular systolic function is normal. The right ventricular  size is normal. There is normal pulmonary artery systolic pressure. The  estimated right ventricular systolic pressure is 33.0 mmHg.  3. A small pericardial effusion is present. The pericardial effusion is  circumferential. There is no evidence of cardiac tamponade.  4. The mitral valve is degenerative. Trivial mitral valve regurgitation.  No evidence of mitral stenosis.  5. The aortic valve is tricuspid. There is mild calcification of the  aortic valve. There is mild thickening of the aortic valve. Aortic valve  regurgitation is not visualized. Mild aortic valve sclerosis is present,  with no evidence of aortic valve  stenosis.  6. The inferior vena cava is normal in size with greater than 50%  respiratory variability, suggesting right atrial pressure of 3 mmHg.      Recent Labs: 10/28/2019: B Natriuretic Peptide 114.6; TSH 4.939 10/29/2019: ALT 26; BUN 12; Creatinine, Ser 0.54; Hemoglobin 10.6; Magnesium 1.6; Platelets 160; Potassium 3.5; Sodium 136  Recent Lipid Panel    Component Value Date/Time   CHOL 197 07/13/2018 0234   TRIG 116 07/13/2018 0234   HDL 53 07/13/2018 0234   CHOLHDL 3.7 07/13/2018 0234    VLDL 23 07/13/2018 0234   LDLCALC 121 (H) 07/13/2018 0234    Physical Exam:    VS:  BP 134/79   Pulse (!) 104   Ht 5\' 4"  (1.626 m)   Wt 117 lb 3.2 oz (53.2 kg)   SpO2 96%   BMI 20.12 kg/m     Wt Readings from Last 3 Encounters:  11/17/19 117 lb 3.2 oz (53.2 kg)  10/30/19 122 lb 12.8 oz (55.7 kg)  06/28/19 129 lb (58.5 kg)     GEN: Patient is in no acute distress HEENT: Normal NECK: No JVD; No carotid bruits LYMPHATICS: No lymphadenopathy CARDIAC: Hear sounds regular, 2/6 systolic murmur at the apex. RESPIRATORY:  Clear to auscultation without rales, wheezing or rhonchi  ABDOMEN: Soft, non-tender, non-distended MUSCULOSKELETAL:  No edema; No deformity  SKIN: Warm and dry NEUROLOGIC:  Alert and oriented x 3 PSYCHIATRIC:  Normal affect   Signed, Garwin Brothers, MD  11/17/2019 4:22 PM    [facility]

## 2019-11-17 NOTE — Patient Instructions (Signed)
Medication Instructions:  Your physician recommends that you continue on your current medications as directed. Please refer to the Current Medication list given to you today.  *If you need a refill on your cardiac medications before your next appointment, please call your pharmacy*   Lab Work: Your physician recommends that you return for lab work today: bmp, cbc, tsh, lipids, vitamin d, and hemoglobin a1c   If you have labs (blood work) drawn today and your tests are completely normal, you will receive your results only by: Marland Kitchen MyChart Message (if you have MyChart) OR . A paper copy in the mail If you have any lab test that is abnormal or we need to change your treatment, we will call you to review the results.   Testing/Procedures: Your physician has recommended that you wear an event monitor. Event monitors are medical devices that record the heart's electrical activity. Doctors most often [place] these monitors to diagnose arrhythmias. Arrhythmias are problems with the speed or rhythm of the heartbeat. The monitor is a small, portable device. You can wear one while you do your normal daily activities. This is usually used to diagnose what is causing palpitations/syncope (passing out).     Follow-Up: At [facility], you and your health needs are our priority.  As part of our continuing mission to provide you with exceptional heart care, we have created designated Provider Care Teams.  These Care Teams include your primary Cardiologist (physician) and Advanced Practice Providers (APPs -  Physician Assistants and Nurse Practitioners) who all work together to provide you with the care you need, when you need it.  We recommend signing up for the patient portal called "MyChart".  Sign up information is provided on this After Visit Summary.  MyChart is used to connect with patients for Virtual Visits (Telemedicine).  Patients are able to view lab/test results, encounter notes, upcoming appointments,  etc.  Non-urgent messages can be sent to your provider as well.   To learn more about what you can do with MyChart, go to [email or web address].    Your next appointment:   6 week(s)  The format for your next appointment:   In Person  Provider:   Belva Crome, MD   Other Instructions

## 2019-11-18 ENCOUNTER — Telehealth: Payer: Self-pay

## 2019-11-18 DIAGNOSIS — Z87891 Personal history of nicotine dependence: Secondary | ICD-10-CM | POA: Diagnosis not present

## 2019-11-18 DIAGNOSIS — M5440 Lumbago with sciatica, unspecified side: Secondary | ICD-10-CM | POA: Diagnosis not present

## 2019-11-18 DIAGNOSIS — E782 Mixed hyperlipidemia: Secondary | ICD-10-CM | POA: Diagnosis not present

## 2019-11-18 DIAGNOSIS — Z9181 History of falling: Secondary | ICD-10-CM | POA: Diagnosis not present

## 2019-11-18 DIAGNOSIS — Z7984 Long term (current) use of oral hypoglycemic drugs: Secondary | ICD-10-CM | POA: Diagnosis not present

## 2019-11-18 DIAGNOSIS — I10 Essential (primary) hypertension: Secondary | ICD-10-CM | POA: Diagnosis not present

## 2019-11-18 DIAGNOSIS — K219 Gastro-esophageal reflux disease without esophagitis: Secondary | ICD-10-CM | POA: Diagnosis not present

## 2019-11-18 DIAGNOSIS — E039 Hypothyroidism, unspecified: Secondary | ICD-10-CM | POA: Diagnosis not present

## 2019-11-18 DIAGNOSIS — E1142 Type 2 diabetes mellitus with diabetic polyneuropathy: Secondary | ICD-10-CM | POA: Diagnosis not present

## 2019-11-18 DIAGNOSIS — J45909 Unspecified asthma, uncomplicated: Secondary | ICD-10-CM | POA: Diagnosis not present

## 2019-11-18 DIAGNOSIS — Z7901 Long term (current) use of anticoagulants: Secondary | ICD-10-CM | POA: Diagnosis not present

## 2019-11-18 DIAGNOSIS — Z7902 Long term (current) use of antithrombotics/antiplatelets: Secondary | ICD-10-CM | POA: Diagnosis not present

## 2019-11-18 DIAGNOSIS — E559 Vitamin D deficiency, unspecified: Secondary | ICD-10-CM | POA: Diagnosis not present

## 2019-11-18 DIAGNOSIS — E538 Deficiency of other specified B group vitamins: Secondary | ICD-10-CM | POA: Diagnosis not present

## 2019-11-18 DIAGNOSIS — K760 Fatty (change of) liver, not elsewhere classified: Secondary | ICD-10-CM | POA: Diagnosis not present

## 2019-11-18 DIAGNOSIS — M159 Polyosteoarthritis, unspecified: Secondary | ICD-10-CM | POA: Diagnosis not present

## 2019-11-18 DIAGNOSIS — R296 Repeated falls: Secondary | ICD-10-CM | POA: Diagnosis not present

## 2019-11-18 DIAGNOSIS — I480 Paroxysmal atrial fibrillation: Secondary | ICD-10-CM | POA: Diagnosis not present

## 2019-11-18 DIAGNOSIS — Z8673 Personal history of transient ischemic attack (TIA), and cerebral infarction without residual deficits: Secondary | ICD-10-CM | POA: Diagnosis not present

## 2019-11-18 DIAGNOSIS — F32A Depression, unspecified: Secondary | ICD-10-CM | POA: Diagnosis not present

## 2019-11-18 DIAGNOSIS — E1151 Type 2 diabetes mellitus with diabetic peripheral angiopathy without gangrene: Secondary | ICD-10-CM | POA: Diagnosis not present

## 2019-11-18 LAB — CBC
Hematocrit: 36.3 % (ref 34.0–46.6)
Hemoglobin: 12.2 g/dL (ref 11.1–15.9)
MCH: 30 pg (ref 26.6–33.0)
MCHC: 33.6 g/dL (ref 31.5–35.7)
MCV: 89 fL (ref 79–97)
Platelets: 202 10*3/uL (ref 150–450)
RBC: 4.07 x10E6/uL (ref 3.77–5.28)
RDW: 14.2 % (ref 11.7–15.4)
WBC: 8.9 10*3/uL (ref 3.4–10.8)

## 2019-11-18 LAB — BASIC METABOLIC PANEL
BUN/Creatinine Ratio: 27 (ref 12–28)
BUN: 21 mg/dL (ref 8–27)
CO2: 22 mmol/L (ref 20–29)
Calcium: 9.4 mg/dL (ref 8.7–10.3)
Chloride: 98 mmol/L (ref 96–106)
Creatinine, Ser: 0.79 mg/dL (ref 0.57–1.00)
GFR calc Af Amer: 79 mL/min/{1.73_m2} (ref >59)
GFR calc non Af Amer: 69 mL/min/{1.73_m2} (ref >59)
Glucose: 139 mg/dL — ABNORMAL HIGH (ref 65–99)
Potassium: 4.3 mmol/L (ref 3.5–5.2)
Sodium: 140 mmol/L (ref 134–144)

## 2019-11-18 LAB — LIPID PANEL
Chol/HDL Ratio: 2.9 ratio (ref 0.0–4.4)
Cholesterol, Total: 137 mg/dL (ref 100–199)
HDL: 48 mg/dL (ref >39)
LDL Chol Calc (NIH): 65 mg/dL (ref 0–99)
Triglycerides: 139 mg/dL (ref 0–149)
VLDL Cholesterol Cal: 24 mg/dL (ref 5–40)

## 2019-11-18 LAB — VITAMIN D 25 HYDROXY (VIT D DEFICIENCY, FRACTURES): Vit D, 25-Hydroxy: 62.3 ng/mL (ref 30.0–100.0)

## 2019-11-18 LAB — HEMOGLOBIN A1C
Est. average glucose Bld gHb Est-mCnc: 146 mg/dL
Hgb A1c MFr Bld: 6.7 % — ABNORMAL HIGH (ref 4.8–5.6)

## 2019-11-18 LAB — TSH: TSH: 0.881 u[IU]/mL (ref 0.450–4.500)

## 2019-11-18 NOTE — Telephone Encounter (Signed)
Spoke with patient regarding results and recommendation.  Patient verbalizes understanding and is agreeable to plan of care. Advised patient to call back with any issues or concerns.  

## 2019-11-18 NOTE — Telephone Encounter (Signed)
-----   Message from Garwin Brothers, MD sent at 11/18/2019  8:35 AM EDT ----- The results of the study is unremarkable. Please inform patient. I will discuss in detail at next appointment. Cc  primary care/referring physician Garwin Brothers, MD 11/18/2019 8:35 AM

## 2019-11-19 DIAGNOSIS — F32A Depression, unspecified: Secondary | ICD-10-CM | POA: Diagnosis not present

## 2019-11-19 DIAGNOSIS — E039 Hypothyroidism, unspecified: Secondary | ICD-10-CM | POA: Diagnosis not present

## 2019-11-19 DIAGNOSIS — R296 Repeated falls: Secondary | ICD-10-CM | POA: Diagnosis not present

## 2019-11-19 DIAGNOSIS — M159 Polyosteoarthritis, unspecified: Secondary | ICD-10-CM | POA: Diagnosis not present

## 2019-11-19 DIAGNOSIS — I480 Paroxysmal atrial fibrillation: Secondary | ICD-10-CM | POA: Diagnosis not present

## 2019-11-19 DIAGNOSIS — Z7984 Long term (current) use of oral hypoglycemic drugs: Secondary | ICD-10-CM | POA: Diagnosis not present

## 2019-11-19 DIAGNOSIS — E782 Mixed hyperlipidemia: Secondary | ICD-10-CM | POA: Diagnosis not present

## 2019-11-19 DIAGNOSIS — Z9181 History of falling: Secondary | ICD-10-CM | POA: Diagnosis not present

## 2019-11-19 DIAGNOSIS — K760 Fatty (change of) liver, not elsewhere classified: Secondary | ICD-10-CM | POA: Diagnosis not present

## 2019-11-19 DIAGNOSIS — K219 Gastro-esophageal reflux disease without esophagitis: Secondary | ICD-10-CM | POA: Diagnosis not present

## 2019-11-19 DIAGNOSIS — E559 Vitamin D deficiency, unspecified: Secondary | ICD-10-CM | POA: Diagnosis not present

## 2019-11-19 DIAGNOSIS — Z7902 Long term (current) use of antithrombotics/antiplatelets: Secondary | ICD-10-CM | POA: Diagnosis not present

## 2019-11-19 DIAGNOSIS — Z8673 Personal history of transient ischemic attack (TIA), and cerebral infarction without residual deficits: Secondary | ICD-10-CM | POA: Diagnosis not present

## 2019-11-19 DIAGNOSIS — E1142 Type 2 diabetes mellitus with diabetic polyneuropathy: Secondary | ICD-10-CM | POA: Diagnosis not present

## 2019-11-19 DIAGNOSIS — J45909 Unspecified asthma, uncomplicated: Secondary | ICD-10-CM | POA: Diagnosis not present

## 2019-11-19 DIAGNOSIS — E538 Deficiency of other specified B group vitamins: Secondary | ICD-10-CM | POA: Diagnosis not present

## 2019-11-19 DIAGNOSIS — Z87891 Personal history of nicotine dependence: Secondary | ICD-10-CM | POA: Diagnosis not present

## 2019-11-19 DIAGNOSIS — M5440 Lumbago with sciatica, unspecified side: Secondary | ICD-10-CM | POA: Diagnosis not present

## 2019-11-19 DIAGNOSIS — E1151 Type 2 diabetes mellitus with diabetic peripheral angiopathy without gangrene: Secondary | ICD-10-CM | POA: Diagnosis not present

## 2019-11-19 DIAGNOSIS — Z7901 Long term (current) use of anticoagulants: Secondary | ICD-10-CM | POA: Diagnosis not present

## 2019-11-19 DIAGNOSIS — I10 Essential (primary) hypertension: Secondary | ICD-10-CM | POA: Diagnosis not present

## 2019-11-22 DIAGNOSIS — R197 Diarrhea, unspecified: Secondary | ICD-10-CM | POA: Diagnosis not present

## 2019-11-22 DIAGNOSIS — R600 Localized edema: Secondary | ICD-10-CM | POA: Diagnosis not present

## 2019-11-22 DIAGNOSIS — R42 Dizziness and giddiness: Secondary | ICD-10-CM | POA: Diagnosis not present

## 2019-11-22 DIAGNOSIS — I951 Orthostatic hypotension: Secondary | ICD-10-CM | POA: Diagnosis not present

## 2019-11-23 DIAGNOSIS — E039 Hypothyroidism, unspecified: Secondary | ICD-10-CM | POA: Diagnosis not present

## 2019-11-23 DIAGNOSIS — Z7984 Long term (current) use of oral hypoglycemic drugs: Secondary | ICD-10-CM | POA: Diagnosis not present

## 2019-11-23 DIAGNOSIS — R296 Repeated falls: Secondary | ICD-10-CM | POA: Diagnosis not present

## 2019-11-23 DIAGNOSIS — E782 Mixed hyperlipidemia: Secondary | ICD-10-CM | POA: Diagnosis not present

## 2019-11-23 DIAGNOSIS — Z8673 Personal history of transient ischemic attack (TIA), and cerebral infarction without residual deficits: Secondary | ICD-10-CM | POA: Diagnosis not present

## 2019-11-23 DIAGNOSIS — M5440 Lumbago with sciatica, unspecified side: Secondary | ICD-10-CM | POA: Diagnosis not present

## 2019-11-23 DIAGNOSIS — K760 Fatty (change of) liver, not elsewhere classified: Secondary | ICD-10-CM | POA: Diagnosis not present

## 2019-11-23 DIAGNOSIS — M159 Polyosteoarthritis, unspecified: Secondary | ICD-10-CM | POA: Diagnosis not present

## 2019-11-23 DIAGNOSIS — Z7902 Long term (current) use of antithrombotics/antiplatelets: Secondary | ICD-10-CM | POA: Diagnosis not present

## 2019-11-23 DIAGNOSIS — I480 Paroxysmal atrial fibrillation: Secondary | ICD-10-CM | POA: Diagnosis not present

## 2019-11-23 DIAGNOSIS — E1142 Type 2 diabetes mellitus with diabetic polyneuropathy: Secondary | ICD-10-CM | POA: Diagnosis not present

## 2019-11-23 DIAGNOSIS — E1151 Type 2 diabetes mellitus with diabetic peripheral angiopathy without gangrene: Secondary | ICD-10-CM | POA: Diagnosis not present

## 2019-11-23 DIAGNOSIS — E538 Deficiency of other specified B group vitamins: Secondary | ICD-10-CM | POA: Diagnosis not present

## 2019-11-23 DIAGNOSIS — K219 Gastro-esophageal reflux disease without esophagitis: Secondary | ICD-10-CM | POA: Diagnosis not present

## 2019-11-23 DIAGNOSIS — Z7901 Long term (current) use of anticoagulants: Secondary | ICD-10-CM | POA: Diagnosis not present

## 2019-11-23 DIAGNOSIS — E559 Vitamin D deficiency, unspecified: Secondary | ICD-10-CM | POA: Diagnosis not present

## 2019-11-23 DIAGNOSIS — I10 Essential (primary) hypertension: Secondary | ICD-10-CM | POA: Diagnosis not present

## 2019-11-23 DIAGNOSIS — Z9181 History of falling: Secondary | ICD-10-CM | POA: Diagnosis not present

## 2019-11-23 DIAGNOSIS — Z87891 Personal history of nicotine dependence: Secondary | ICD-10-CM | POA: Diagnosis not present

## 2019-11-23 DIAGNOSIS — F32A Depression, unspecified: Secondary | ICD-10-CM | POA: Diagnosis not present

## 2019-11-23 DIAGNOSIS — J45909 Unspecified asthma, uncomplicated: Secondary | ICD-10-CM | POA: Diagnosis not present

## 2019-11-25 DIAGNOSIS — J45909 Unspecified asthma, uncomplicated: Secondary | ICD-10-CM | POA: Diagnosis not present

## 2019-11-25 DIAGNOSIS — E1142 Type 2 diabetes mellitus with diabetic polyneuropathy: Secondary | ICD-10-CM | POA: Diagnosis not present

## 2019-11-25 DIAGNOSIS — F32A Depression, unspecified: Secondary | ICD-10-CM | POA: Diagnosis not present

## 2019-11-25 DIAGNOSIS — I480 Paroxysmal atrial fibrillation: Secondary | ICD-10-CM | POA: Diagnosis not present

## 2019-11-25 DIAGNOSIS — Z7901 Long term (current) use of anticoagulants: Secondary | ICD-10-CM | POA: Diagnosis not present

## 2019-11-25 DIAGNOSIS — E1151 Type 2 diabetes mellitus with diabetic peripheral angiopathy without gangrene: Secondary | ICD-10-CM | POA: Diagnosis not present

## 2019-11-25 DIAGNOSIS — E559 Vitamin D deficiency, unspecified: Secondary | ICD-10-CM | POA: Diagnosis not present

## 2019-11-25 DIAGNOSIS — Z7984 Long term (current) use of oral hypoglycemic drugs: Secondary | ICD-10-CM | POA: Diagnosis not present

## 2019-11-25 DIAGNOSIS — Z8673 Personal history of transient ischemic attack (TIA), and cerebral infarction without residual deficits: Secondary | ICD-10-CM | POA: Diagnosis not present

## 2019-11-25 DIAGNOSIS — E039 Hypothyroidism, unspecified: Secondary | ICD-10-CM | POA: Diagnosis not present

## 2019-11-25 DIAGNOSIS — R296 Repeated falls: Secondary | ICD-10-CM | POA: Diagnosis not present

## 2019-11-25 DIAGNOSIS — E782 Mixed hyperlipidemia: Secondary | ICD-10-CM | POA: Diagnosis not present

## 2019-11-25 DIAGNOSIS — K760 Fatty (change of) liver, not elsewhere classified: Secondary | ICD-10-CM | POA: Diagnosis not present

## 2019-11-25 DIAGNOSIS — Z9181 History of falling: Secondary | ICD-10-CM | POA: Diagnosis not present

## 2019-11-25 DIAGNOSIS — M159 Polyosteoarthritis, unspecified: Secondary | ICD-10-CM | POA: Diagnosis not present

## 2019-11-25 DIAGNOSIS — Z7902 Long term (current) use of antithrombotics/antiplatelets: Secondary | ICD-10-CM | POA: Diagnosis not present

## 2019-11-25 DIAGNOSIS — E538 Deficiency of other specified B group vitamins: Secondary | ICD-10-CM | POA: Diagnosis not present

## 2019-11-25 DIAGNOSIS — I10 Essential (primary) hypertension: Secondary | ICD-10-CM | POA: Diagnosis not present

## 2019-11-25 DIAGNOSIS — K219 Gastro-esophageal reflux disease without esophagitis: Secondary | ICD-10-CM | POA: Diagnosis not present

## 2019-11-25 DIAGNOSIS — M5440 Lumbago with sciatica, unspecified side: Secondary | ICD-10-CM | POA: Diagnosis not present

## 2019-11-25 DIAGNOSIS — Z87891 Personal history of nicotine dependence: Secondary | ICD-10-CM | POA: Diagnosis not present

## 2019-11-26 DIAGNOSIS — F32A Depression, unspecified: Secondary | ICD-10-CM | POA: Diagnosis not present

## 2019-11-26 DIAGNOSIS — K219 Gastro-esophageal reflux disease without esophagitis: Secondary | ICD-10-CM | POA: Diagnosis not present

## 2019-11-26 DIAGNOSIS — Z7984 Long term (current) use of oral hypoglycemic drugs: Secondary | ICD-10-CM | POA: Diagnosis not present

## 2019-11-26 DIAGNOSIS — E782 Mixed hyperlipidemia: Secondary | ICD-10-CM | POA: Diagnosis not present

## 2019-11-26 DIAGNOSIS — K760 Fatty (change of) liver, not elsewhere classified: Secondary | ICD-10-CM | POA: Diagnosis not present

## 2019-11-26 DIAGNOSIS — E538 Deficiency of other specified B group vitamins: Secondary | ICD-10-CM | POA: Diagnosis not present

## 2019-11-26 DIAGNOSIS — R296 Repeated falls: Secondary | ICD-10-CM | POA: Diagnosis not present

## 2019-11-26 DIAGNOSIS — Z7901 Long term (current) use of anticoagulants: Secondary | ICD-10-CM | POA: Diagnosis not present

## 2019-11-26 DIAGNOSIS — E039 Hypothyroidism, unspecified: Secondary | ICD-10-CM | POA: Diagnosis not present

## 2019-11-26 DIAGNOSIS — M5440 Lumbago with sciatica, unspecified side: Secondary | ICD-10-CM | POA: Diagnosis not present

## 2019-11-26 DIAGNOSIS — Z87891 Personal history of nicotine dependence: Secondary | ICD-10-CM | POA: Diagnosis not present

## 2019-11-26 DIAGNOSIS — E1151 Type 2 diabetes mellitus with diabetic peripheral angiopathy without gangrene: Secondary | ICD-10-CM | POA: Diagnosis not present

## 2019-11-26 DIAGNOSIS — Z9181 History of falling: Secondary | ICD-10-CM | POA: Diagnosis not present

## 2019-11-26 DIAGNOSIS — Z7902 Long term (current) use of antithrombotics/antiplatelets: Secondary | ICD-10-CM | POA: Diagnosis not present

## 2019-11-26 DIAGNOSIS — M159 Polyosteoarthritis, unspecified: Secondary | ICD-10-CM | POA: Diagnosis not present

## 2019-11-26 DIAGNOSIS — E1142 Type 2 diabetes mellitus with diabetic polyneuropathy: Secondary | ICD-10-CM | POA: Diagnosis not present

## 2019-11-26 DIAGNOSIS — I10 Essential (primary) hypertension: Secondary | ICD-10-CM | POA: Diagnosis not present

## 2019-11-26 DIAGNOSIS — E559 Vitamin D deficiency, unspecified: Secondary | ICD-10-CM | POA: Diagnosis not present

## 2019-11-26 DIAGNOSIS — I480 Paroxysmal atrial fibrillation: Secondary | ICD-10-CM | POA: Diagnosis not present

## 2019-11-26 DIAGNOSIS — Z8673 Personal history of transient ischemic attack (TIA), and cerebral infarction without residual deficits: Secondary | ICD-10-CM | POA: Diagnosis not present

## 2019-11-26 DIAGNOSIS — J45909 Unspecified asthma, uncomplicated: Secondary | ICD-10-CM | POA: Diagnosis not present

## 2019-11-29 DIAGNOSIS — E039 Hypothyroidism, unspecified: Secondary | ICD-10-CM | POA: Diagnosis not present

## 2019-11-29 DIAGNOSIS — R296 Repeated falls: Secondary | ICD-10-CM | POA: Diagnosis not present

## 2019-11-29 DIAGNOSIS — F32A Depression, unspecified: Secondary | ICD-10-CM | POA: Diagnosis not present

## 2019-11-29 DIAGNOSIS — Z8673 Personal history of transient ischemic attack (TIA), and cerebral infarction without residual deficits: Secondary | ICD-10-CM | POA: Diagnosis not present

## 2019-11-29 DIAGNOSIS — Z87891 Personal history of nicotine dependence: Secondary | ICD-10-CM | POA: Diagnosis not present

## 2019-11-29 DIAGNOSIS — I10 Essential (primary) hypertension: Secondary | ICD-10-CM | POA: Diagnosis not present

## 2019-11-29 DIAGNOSIS — I480 Paroxysmal atrial fibrillation: Secondary | ICD-10-CM | POA: Diagnosis not present

## 2019-11-29 DIAGNOSIS — J45909 Unspecified asthma, uncomplicated: Secondary | ICD-10-CM | POA: Diagnosis not present

## 2019-11-29 DIAGNOSIS — K219 Gastro-esophageal reflux disease without esophagitis: Secondary | ICD-10-CM | POA: Diagnosis not present

## 2019-11-29 DIAGNOSIS — Z7984 Long term (current) use of oral hypoglycemic drugs: Secondary | ICD-10-CM | POA: Diagnosis not present

## 2019-11-29 DIAGNOSIS — Z9181 History of falling: Secondary | ICD-10-CM | POA: Diagnosis not present

## 2019-11-29 DIAGNOSIS — E559 Vitamin D deficiency, unspecified: Secondary | ICD-10-CM | POA: Diagnosis not present

## 2019-11-29 DIAGNOSIS — Z7902 Long term (current) use of antithrombotics/antiplatelets: Secondary | ICD-10-CM | POA: Diagnosis not present

## 2019-11-29 DIAGNOSIS — E782 Mixed hyperlipidemia: Secondary | ICD-10-CM | POA: Diagnosis not present

## 2019-11-29 DIAGNOSIS — M159 Polyosteoarthritis, unspecified: Secondary | ICD-10-CM | POA: Diagnosis not present

## 2019-11-29 DIAGNOSIS — Z7901 Long term (current) use of anticoagulants: Secondary | ICD-10-CM | POA: Diagnosis not present

## 2019-11-29 DIAGNOSIS — M5440 Lumbago with sciatica, unspecified side: Secondary | ICD-10-CM | POA: Diagnosis not present

## 2019-11-29 DIAGNOSIS — K760 Fatty (change of) liver, not elsewhere classified: Secondary | ICD-10-CM | POA: Diagnosis not present

## 2019-11-29 DIAGNOSIS — E1142 Type 2 diabetes mellitus with diabetic polyneuropathy: Secondary | ICD-10-CM | POA: Diagnosis not present

## 2019-11-29 DIAGNOSIS — E538 Deficiency of other specified B group vitamins: Secondary | ICD-10-CM | POA: Diagnosis not present

## 2019-11-29 DIAGNOSIS — E1151 Type 2 diabetes mellitus with diabetic peripheral angiopathy without gangrene: Secondary | ICD-10-CM | POA: Diagnosis not present

## 2019-11-30 ENCOUNTER — Telehealth: Payer: Self-pay | Admitting: Cardiology

## 2019-11-30 NOTE — Telephone Encounter (Signed)
Called patient son per dpr. Advised him patient has refills at pharmacy. He will call them.

## 2019-11-30 NOTE — Telephone Encounter (Signed)
  *  STAT* If patient is at the pharmacy, call can be transferred to refill team.   1. Which medications need to be refilled? (please list name of each medication and dose if known) nitroGLYCERIN (NITROSTAT) 0.4 MG SL tablet  2. Which pharmacy/location (including street and city if local pharmacy) is medication to be sent to? [facility] - [place], [place] - [address]  3. Do they need a 30 day or 90 day supply? 1 bottle

## 2019-12-01 DIAGNOSIS — E782 Mixed hyperlipidemia: Secondary | ICD-10-CM | POA: Diagnosis not present

## 2019-12-01 DIAGNOSIS — Z7901 Long term (current) use of anticoagulants: Secondary | ICD-10-CM | POA: Diagnosis not present

## 2019-12-01 DIAGNOSIS — M5440 Lumbago with sciatica, unspecified side: Secondary | ICD-10-CM | POA: Diagnosis not present

## 2019-12-01 DIAGNOSIS — K760 Fatty (change of) liver, not elsewhere classified: Secondary | ICD-10-CM | POA: Diagnosis not present

## 2019-12-01 DIAGNOSIS — K219 Gastro-esophageal reflux disease without esophagitis: Secondary | ICD-10-CM | POA: Diagnosis not present

## 2019-12-01 DIAGNOSIS — M159 Polyosteoarthritis, unspecified: Secondary | ICD-10-CM | POA: Diagnosis not present

## 2019-12-01 DIAGNOSIS — Z8673 Personal history of transient ischemic attack (TIA), and cerebral infarction without residual deficits: Secondary | ICD-10-CM | POA: Diagnosis not present

## 2019-12-01 DIAGNOSIS — E538 Deficiency of other specified B group vitamins: Secondary | ICD-10-CM | POA: Diagnosis not present

## 2019-12-01 DIAGNOSIS — E1142 Type 2 diabetes mellitus with diabetic polyneuropathy: Secondary | ICD-10-CM | POA: Diagnosis not present

## 2019-12-01 DIAGNOSIS — R296 Repeated falls: Secondary | ICD-10-CM | POA: Diagnosis not present

## 2019-12-01 DIAGNOSIS — F32A Depression, unspecified: Secondary | ICD-10-CM | POA: Diagnosis not present

## 2019-12-01 DIAGNOSIS — E1151 Type 2 diabetes mellitus with diabetic peripheral angiopathy without gangrene: Secondary | ICD-10-CM | POA: Diagnosis not present

## 2019-12-01 DIAGNOSIS — Z7984 Long term (current) use of oral hypoglycemic drugs: Secondary | ICD-10-CM | POA: Diagnosis not present

## 2019-12-01 DIAGNOSIS — J45909 Unspecified asthma, uncomplicated: Secondary | ICD-10-CM | POA: Diagnosis not present

## 2019-12-01 DIAGNOSIS — E039 Hypothyroidism, unspecified: Secondary | ICD-10-CM | POA: Diagnosis not present

## 2019-12-01 DIAGNOSIS — I10 Essential (primary) hypertension: Secondary | ICD-10-CM | POA: Diagnosis not present

## 2019-12-01 DIAGNOSIS — I480 Paroxysmal atrial fibrillation: Secondary | ICD-10-CM | POA: Diagnosis not present

## 2019-12-01 DIAGNOSIS — Z9181 History of falling: Secondary | ICD-10-CM | POA: Diagnosis not present

## 2019-12-01 DIAGNOSIS — E559 Vitamin D deficiency, unspecified: Secondary | ICD-10-CM | POA: Diagnosis not present

## 2019-12-01 DIAGNOSIS — Z7902 Long term (current) use of antithrombotics/antiplatelets: Secondary | ICD-10-CM | POA: Diagnosis not present

## 2019-12-01 DIAGNOSIS — Z87891 Personal history of nicotine dependence: Secondary | ICD-10-CM | POA: Diagnosis not present

## 2019-12-03 DIAGNOSIS — M5440 Lumbago with sciatica, unspecified side: Secondary | ICD-10-CM | POA: Diagnosis not present

## 2019-12-03 DIAGNOSIS — F32A Depression, unspecified: Secondary | ICD-10-CM | POA: Diagnosis not present

## 2019-12-03 DIAGNOSIS — I480 Paroxysmal atrial fibrillation: Secondary | ICD-10-CM | POA: Diagnosis not present

## 2019-12-03 DIAGNOSIS — Z7901 Long term (current) use of anticoagulants: Secondary | ICD-10-CM | POA: Diagnosis not present

## 2019-12-03 DIAGNOSIS — E538 Deficiency of other specified B group vitamins: Secondary | ICD-10-CM | POA: Diagnosis not present

## 2019-12-03 DIAGNOSIS — Z87891 Personal history of nicotine dependence: Secondary | ICD-10-CM | POA: Diagnosis not present

## 2019-12-03 DIAGNOSIS — I10 Essential (primary) hypertension: Secondary | ICD-10-CM | POA: Diagnosis not present

## 2019-12-03 DIAGNOSIS — E1151 Type 2 diabetes mellitus with diabetic peripheral angiopathy without gangrene: Secondary | ICD-10-CM | POA: Diagnosis not present

## 2019-12-03 DIAGNOSIS — E039 Hypothyroidism, unspecified: Secondary | ICD-10-CM | POA: Diagnosis not present

## 2019-12-03 DIAGNOSIS — Z7902 Long term (current) use of antithrombotics/antiplatelets: Secondary | ICD-10-CM | POA: Diagnosis not present

## 2019-12-03 DIAGNOSIS — E559 Vitamin D deficiency, unspecified: Secondary | ICD-10-CM | POA: Diagnosis not present

## 2019-12-03 DIAGNOSIS — Z9181 History of falling: Secondary | ICD-10-CM | POA: Diagnosis not present

## 2019-12-03 DIAGNOSIS — R296 Repeated falls: Secondary | ICD-10-CM | POA: Diagnosis not present

## 2019-12-03 DIAGNOSIS — K760 Fatty (change of) liver, not elsewhere classified: Secondary | ICD-10-CM | POA: Diagnosis not present

## 2019-12-03 DIAGNOSIS — M159 Polyosteoarthritis, unspecified: Secondary | ICD-10-CM | POA: Diagnosis not present

## 2019-12-03 DIAGNOSIS — E782 Mixed hyperlipidemia: Secondary | ICD-10-CM | POA: Diagnosis not present

## 2019-12-03 DIAGNOSIS — K219 Gastro-esophageal reflux disease without esophagitis: Secondary | ICD-10-CM | POA: Diagnosis not present

## 2019-12-03 DIAGNOSIS — E1142 Type 2 diabetes mellitus with diabetic polyneuropathy: Secondary | ICD-10-CM | POA: Diagnosis not present

## 2019-12-03 DIAGNOSIS — Z8673 Personal history of transient ischemic attack (TIA), and cerebral infarction without residual deficits: Secondary | ICD-10-CM | POA: Diagnosis not present

## 2019-12-03 DIAGNOSIS — J45909 Unspecified asthma, uncomplicated: Secondary | ICD-10-CM | POA: Diagnosis not present

## 2019-12-03 DIAGNOSIS — Z7984 Long term (current) use of oral hypoglycemic drugs: Secondary | ICD-10-CM | POA: Diagnosis not present

## 2019-12-07 DIAGNOSIS — K219 Gastro-esophageal reflux disease without esophagitis: Secondary | ICD-10-CM | POA: Diagnosis not present

## 2019-12-07 DIAGNOSIS — E039 Hypothyroidism, unspecified: Secondary | ICD-10-CM | POA: Diagnosis not present

## 2019-12-07 DIAGNOSIS — I480 Paroxysmal atrial fibrillation: Secondary | ICD-10-CM | POA: Diagnosis not present

## 2019-12-07 DIAGNOSIS — M159 Polyosteoarthritis, unspecified: Secondary | ICD-10-CM | POA: Diagnosis not present

## 2019-12-07 DIAGNOSIS — Z8673 Personal history of transient ischemic attack (TIA), and cerebral infarction without residual deficits: Secondary | ICD-10-CM | POA: Diagnosis not present

## 2019-12-07 DIAGNOSIS — E782 Mixed hyperlipidemia: Secondary | ICD-10-CM | POA: Diagnosis not present

## 2019-12-07 DIAGNOSIS — E538 Deficiency of other specified B group vitamins: Secondary | ICD-10-CM | POA: Diagnosis not present

## 2019-12-07 DIAGNOSIS — E1142 Type 2 diabetes mellitus with diabetic polyneuropathy: Secondary | ICD-10-CM | POA: Diagnosis not present

## 2019-12-07 DIAGNOSIS — M5440 Lumbago with sciatica, unspecified side: Secondary | ICD-10-CM | POA: Diagnosis not present

## 2019-12-07 DIAGNOSIS — K760 Fatty (change of) liver, not elsewhere classified: Secondary | ICD-10-CM | POA: Diagnosis not present

## 2019-12-07 DIAGNOSIS — J45909 Unspecified asthma, uncomplicated: Secondary | ICD-10-CM | POA: Diagnosis not present

## 2019-12-07 DIAGNOSIS — I10 Essential (primary) hypertension: Secondary | ICD-10-CM | POA: Diagnosis not present

## 2019-12-07 DIAGNOSIS — Z7901 Long term (current) use of anticoagulants: Secondary | ICD-10-CM | POA: Diagnosis not present

## 2019-12-07 DIAGNOSIS — Z87891 Personal history of nicotine dependence: Secondary | ICD-10-CM | POA: Diagnosis not present

## 2019-12-07 DIAGNOSIS — Z7984 Long term (current) use of oral hypoglycemic drugs: Secondary | ICD-10-CM | POA: Diagnosis not present

## 2019-12-07 DIAGNOSIS — R296 Repeated falls: Secondary | ICD-10-CM | POA: Diagnosis not present

## 2019-12-07 DIAGNOSIS — E1151 Type 2 diabetes mellitus with diabetic peripheral angiopathy without gangrene: Secondary | ICD-10-CM | POA: Diagnosis not present

## 2019-12-07 DIAGNOSIS — F32A Depression, unspecified: Secondary | ICD-10-CM | POA: Diagnosis not present

## 2019-12-07 DIAGNOSIS — Z9181 History of falling: Secondary | ICD-10-CM | POA: Diagnosis not present

## 2019-12-07 DIAGNOSIS — Z7902 Long term (current) use of antithrombotics/antiplatelets: Secondary | ICD-10-CM | POA: Diagnosis not present

## 2019-12-07 DIAGNOSIS — E559 Vitamin D deficiency, unspecified: Secondary | ICD-10-CM | POA: Diagnosis not present

## 2019-12-08 DIAGNOSIS — J45909 Unspecified asthma, uncomplicated: Secondary | ICD-10-CM | POA: Diagnosis not present

## 2019-12-08 DIAGNOSIS — F32A Depression, unspecified: Secondary | ICD-10-CM | POA: Diagnosis not present

## 2019-12-08 DIAGNOSIS — I10 Essential (primary) hypertension: Secondary | ICD-10-CM | POA: Diagnosis not present

## 2019-12-08 DIAGNOSIS — M5440 Lumbago with sciatica, unspecified side: Secondary | ICD-10-CM | POA: Diagnosis not present

## 2019-12-08 DIAGNOSIS — E1151 Type 2 diabetes mellitus with diabetic peripheral angiopathy without gangrene: Secondary | ICD-10-CM | POA: Diagnosis not present

## 2019-12-08 DIAGNOSIS — E538 Deficiency of other specified B group vitamins: Secondary | ICD-10-CM | POA: Diagnosis not present

## 2019-12-08 DIAGNOSIS — Z7902 Long term (current) use of antithrombotics/antiplatelets: Secondary | ICD-10-CM | POA: Diagnosis not present

## 2019-12-08 DIAGNOSIS — K760 Fatty (change of) liver, not elsewhere classified: Secondary | ICD-10-CM | POA: Diagnosis not present

## 2019-12-08 DIAGNOSIS — Z7901 Long term (current) use of anticoagulants: Secondary | ICD-10-CM | POA: Diagnosis not present

## 2019-12-08 DIAGNOSIS — R296 Repeated falls: Secondary | ICD-10-CM | POA: Diagnosis not present

## 2019-12-08 DIAGNOSIS — M159 Polyosteoarthritis, unspecified: Secondary | ICD-10-CM | POA: Diagnosis not present

## 2019-12-08 DIAGNOSIS — E1142 Type 2 diabetes mellitus with diabetic polyneuropathy: Secondary | ICD-10-CM | POA: Diagnosis not present

## 2019-12-08 DIAGNOSIS — E559 Vitamin D deficiency, unspecified: Secondary | ICD-10-CM | POA: Diagnosis not present

## 2019-12-08 DIAGNOSIS — Z9181 History of falling: Secondary | ICD-10-CM | POA: Diagnosis not present

## 2019-12-08 DIAGNOSIS — I480 Paroxysmal atrial fibrillation: Secondary | ICD-10-CM | POA: Diagnosis not present

## 2019-12-08 DIAGNOSIS — Z87891 Personal history of nicotine dependence: Secondary | ICD-10-CM | POA: Diagnosis not present

## 2019-12-08 DIAGNOSIS — Z7984 Long term (current) use of oral hypoglycemic drugs: Secondary | ICD-10-CM | POA: Diagnosis not present

## 2019-12-08 DIAGNOSIS — E782 Mixed hyperlipidemia: Secondary | ICD-10-CM | POA: Diagnosis not present

## 2019-12-08 DIAGNOSIS — E039 Hypothyroidism, unspecified: Secondary | ICD-10-CM | POA: Diagnosis not present

## 2019-12-08 DIAGNOSIS — Z8673 Personal history of transient ischemic attack (TIA), and cerebral infarction without residual deficits: Secondary | ICD-10-CM | POA: Diagnosis not present

## 2019-12-08 DIAGNOSIS — K219 Gastro-esophageal reflux disease without esophagitis: Secondary | ICD-10-CM | POA: Diagnosis not present

## 2019-12-08 NOTE — Telephone Encounter (Signed)
Patient's son is following up. He would like to know when heart monitor can be removed. Please advise.

## 2019-12-08 NOTE — Telephone Encounter (Addendum)
Pts son called asking how long the pt needs to wear the monitor.  Pt has a 30 day event monitor and it was put on in the office 11/17/19.... I advised him that she needs to keep it on until at least 12/18/19 and then they can send back via UPS:  [employer] event monitor return- equipment put in blue box it came in, seal the box, has adhesive strip on box, box has prepaid UPS shipping label on it . Drop off at any UPS store or drop box , or [employer] takes UPS packages ([place] has one). or call [employer] [phone] and schedule UPS pick up at patients home. Patient must be home all day on the scheduled day of pickup/ they will not give a specific time.  He verbalized understanding and will call if he has any further questions.   Pt to keep her follow up 12/30/19.

## 2019-12-10 DIAGNOSIS — E782 Mixed hyperlipidemia: Secondary | ICD-10-CM | POA: Diagnosis not present

## 2019-12-10 DIAGNOSIS — Z7902 Long term (current) use of antithrombotics/antiplatelets: Secondary | ICD-10-CM | POA: Diagnosis not present

## 2019-12-10 DIAGNOSIS — Z7901 Long term (current) use of anticoagulants: Secondary | ICD-10-CM | POA: Diagnosis not present

## 2019-12-10 DIAGNOSIS — E1151 Type 2 diabetes mellitus with diabetic peripheral angiopathy without gangrene: Secondary | ICD-10-CM | POA: Diagnosis not present

## 2019-12-10 DIAGNOSIS — E039 Hypothyroidism, unspecified: Secondary | ICD-10-CM | POA: Diagnosis not present

## 2019-12-10 DIAGNOSIS — R296 Repeated falls: Secondary | ICD-10-CM | POA: Diagnosis not present

## 2019-12-10 DIAGNOSIS — M5440 Lumbago with sciatica, unspecified side: Secondary | ICD-10-CM | POA: Diagnosis not present

## 2019-12-10 DIAGNOSIS — F32A Depression, unspecified: Secondary | ICD-10-CM | POA: Diagnosis not present

## 2019-12-10 DIAGNOSIS — E538 Deficiency of other specified B group vitamins: Secondary | ICD-10-CM | POA: Diagnosis not present

## 2019-12-10 DIAGNOSIS — K219 Gastro-esophageal reflux disease without esophagitis: Secondary | ICD-10-CM | POA: Diagnosis not present

## 2019-12-10 DIAGNOSIS — K760 Fatty (change of) liver, not elsewhere classified: Secondary | ICD-10-CM | POA: Diagnosis not present

## 2019-12-10 DIAGNOSIS — M159 Polyosteoarthritis, unspecified: Secondary | ICD-10-CM | POA: Diagnosis not present

## 2019-12-10 DIAGNOSIS — I480 Paroxysmal atrial fibrillation: Secondary | ICD-10-CM | POA: Diagnosis not present

## 2019-12-10 DIAGNOSIS — I10 Essential (primary) hypertension: Secondary | ICD-10-CM | POA: Diagnosis not present

## 2019-12-10 DIAGNOSIS — E559 Vitamin D deficiency, unspecified: Secondary | ICD-10-CM | POA: Diagnosis not present

## 2019-12-10 DIAGNOSIS — Z7984 Long term (current) use of oral hypoglycemic drugs: Secondary | ICD-10-CM | POA: Diagnosis not present

## 2019-12-10 DIAGNOSIS — Z87891 Personal history of nicotine dependence: Secondary | ICD-10-CM | POA: Diagnosis not present

## 2019-12-10 DIAGNOSIS — Z8673 Personal history of transient ischemic attack (TIA), and cerebral infarction without residual deficits: Secondary | ICD-10-CM | POA: Diagnosis not present

## 2019-12-10 DIAGNOSIS — Z9181 History of falling: Secondary | ICD-10-CM | POA: Diagnosis not present

## 2019-12-10 DIAGNOSIS — E1142 Type 2 diabetes mellitus with diabetic polyneuropathy: Secondary | ICD-10-CM | POA: Diagnosis not present

## 2019-12-10 DIAGNOSIS — J45909 Unspecified asthma, uncomplicated: Secondary | ICD-10-CM | POA: Diagnosis not present

## 2019-12-13 DIAGNOSIS — Z7984 Long term (current) use of oral hypoglycemic drugs: Secondary | ICD-10-CM | POA: Diagnosis not present

## 2019-12-13 DIAGNOSIS — M159 Polyosteoarthritis, unspecified: Secondary | ICD-10-CM | POA: Diagnosis not present

## 2019-12-13 DIAGNOSIS — E039 Hypothyroidism, unspecified: Secondary | ICD-10-CM | POA: Diagnosis not present

## 2019-12-13 DIAGNOSIS — E1142 Type 2 diabetes mellitus with diabetic polyneuropathy: Secondary | ICD-10-CM | POA: Diagnosis not present

## 2019-12-13 DIAGNOSIS — Z9181 History of falling: Secondary | ICD-10-CM | POA: Diagnosis not present

## 2019-12-13 DIAGNOSIS — Z7901 Long term (current) use of anticoagulants: Secondary | ICD-10-CM | POA: Diagnosis not present

## 2019-12-13 DIAGNOSIS — E538 Deficiency of other specified B group vitamins: Secondary | ICD-10-CM | POA: Diagnosis not present

## 2019-12-13 DIAGNOSIS — F32A Depression, unspecified: Secondary | ICD-10-CM | POA: Diagnosis not present

## 2019-12-13 DIAGNOSIS — E559 Vitamin D deficiency, unspecified: Secondary | ICD-10-CM | POA: Diagnosis not present

## 2019-12-13 DIAGNOSIS — E782 Mixed hyperlipidemia: Secondary | ICD-10-CM | POA: Diagnosis not present

## 2019-12-13 DIAGNOSIS — E1151 Type 2 diabetes mellitus with diabetic peripheral angiopathy without gangrene: Secondary | ICD-10-CM | POA: Diagnosis not present

## 2019-12-13 DIAGNOSIS — J45909 Unspecified asthma, uncomplicated: Secondary | ICD-10-CM | POA: Diagnosis not present

## 2019-12-13 DIAGNOSIS — Z8673 Personal history of transient ischemic attack (TIA), and cerebral infarction without residual deficits: Secondary | ICD-10-CM | POA: Diagnosis not present

## 2019-12-13 DIAGNOSIS — M5440 Lumbago with sciatica, unspecified side: Secondary | ICD-10-CM | POA: Diagnosis not present

## 2019-12-13 DIAGNOSIS — Z87891 Personal history of nicotine dependence: Secondary | ICD-10-CM | POA: Diagnosis not present

## 2019-12-13 DIAGNOSIS — K760 Fatty (change of) liver, not elsewhere classified: Secondary | ICD-10-CM | POA: Diagnosis not present

## 2019-12-13 DIAGNOSIS — R296 Repeated falls: Secondary | ICD-10-CM | POA: Diagnosis not present

## 2019-12-13 DIAGNOSIS — I10 Essential (primary) hypertension: Secondary | ICD-10-CM | POA: Diagnosis not present

## 2019-12-13 DIAGNOSIS — K219 Gastro-esophageal reflux disease without esophagitis: Secondary | ICD-10-CM | POA: Diagnosis not present

## 2019-12-13 DIAGNOSIS — I480 Paroxysmal atrial fibrillation: Secondary | ICD-10-CM | POA: Diagnosis not present

## 2019-12-13 DIAGNOSIS — Z7902 Long term (current) use of antithrombotics/antiplatelets: Secondary | ICD-10-CM | POA: Diagnosis not present

## 2019-12-20 DIAGNOSIS — E1142 Type 2 diabetes mellitus with diabetic polyneuropathy: Secondary | ICD-10-CM | POA: Diagnosis not present

## 2019-12-20 DIAGNOSIS — E782 Mixed hyperlipidemia: Secondary | ICD-10-CM | POA: Diagnosis not present

## 2019-12-20 DIAGNOSIS — F32A Depression, unspecified: Secondary | ICD-10-CM | POA: Diagnosis not present

## 2019-12-20 DIAGNOSIS — E538 Deficiency of other specified B group vitamins: Secondary | ICD-10-CM | POA: Diagnosis not present

## 2019-12-20 DIAGNOSIS — Z87891 Personal history of nicotine dependence: Secondary | ICD-10-CM | POA: Diagnosis not present

## 2019-12-20 DIAGNOSIS — Z7902 Long term (current) use of antithrombotics/antiplatelets: Secondary | ICD-10-CM | POA: Diagnosis not present

## 2019-12-20 DIAGNOSIS — Z9181 History of falling: Secondary | ICD-10-CM | POA: Diagnosis not present

## 2019-12-20 DIAGNOSIS — M5440 Lumbago with sciatica, unspecified side: Secondary | ICD-10-CM | POA: Diagnosis not present

## 2019-12-20 DIAGNOSIS — M159 Polyosteoarthritis, unspecified: Secondary | ICD-10-CM | POA: Diagnosis not present

## 2019-12-20 DIAGNOSIS — K760 Fatty (change of) liver, not elsewhere classified: Secondary | ICD-10-CM | POA: Diagnosis not present

## 2019-12-20 DIAGNOSIS — Z7984 Long term (current) use of oral hypoglycemic drugs: Secondary | ICD-10-CM | POA: Diagnosis not present

## 2019-12-20 DIAGNOSIS — E039 Hypothyroidism, unspecified: Secondary | ICD-10-CM | POA: Diagnosis not present

## 2019-12-20 DIAGNOSIS — E1151 Type 2 diabetes mellitus with diabetic peripheral angiopathy without gangrene: Secondary | ICD-10-CM | POA: Diagnosis not present

## 2019-12-20 DIAGNOSIS — J45909 Unspecified asthma, uncomplicated: Secondary | ICD-10-CM | POA: Diagnosis not present

## 2019-12-20 DIAGNOSIS — Z8673 Personal history of transient ischemic attack (TIA), and cerebral infarction without residual deficits: Secondary | ICD-10-CM | POA: Diagnosis not present

## 2019-12-20 DIAGNOSIS — I480 Paroxysmal atrial fibrillation: Secondary | ICD-10-CM | POA: Diagnosis not present

## 2019-12-20 DIAGNOSIS — R296 Repeated falls: Secondary | ICD-10-CM | POA: Diagnosis not present

## 2019-12-20 DIAGNOSIS — E559 Vitamin D deficiency, unspecified: Secondary | ICD-10-CM | POA: Diagnosis not present

## 2019-12-20 DIAGNOSIS — I10 Essential (primary) hypertension: Secondary | ICD-10-CM | POA: Diagnosis not present

## 2019-12-20 DIAGNOSIS — Z7901 Long term (current) use of anticoagulants: Secondary | ICD-10-CM | POA: Diagnosis not present

## 2019-12-20 DIAGNOSIS — K219 Gastro-esophageal reflux disease without esophagitis: Secondary | ICD-10-CM | POA: Diagnosis not present

## 2019-12-27 DIAGNOSIS — E1151 Type 2 diabetes mellitus with diabetic peripheral angiopathy without gangrene: Secondary | ICD-10-CM | POA: Diagnosis not present

## 2019-12-27 DIAGNOSIS — J45909 Unspecified asthma, uncomplicated: Secondary | ICD-10-CM | POA: Diagnosis not present

## 2019-12-27 DIAGNOSIS — I10 Essential (primary) hypertension: Secondary | ICD-10-CM | POA: Diagnosis not present

## 2019-12-27 DIAGNOSIS — F32A Depression, unspecified: Secondary | ICD-10-CM | POA: Diagnosis not present

## 2019-12-27 DIAGNOSIS — Z7901 Long term (current) use of anticoagulants: Secondary | ICD-10-CM | POA: Diagnosis not present

## 2019-12-27 DIAGNOSIS — E782 Mixed hyperlipidemia: Secondary | ICD-10-CM | POA: Diagnosis not present

## 2019-12-27 DIAGNOSIS — E559 Vitamin D deficiency, unspecified: Secondary | ICD-10-CM | POA: Diagnosis not present

## 2019-12-27 DIAGNOSIS — Z8673 Personal history of transient ischemic attack (TIA), and cerebral infarction without residual deficits: Secondary | ICD-10-CM | POA: Diagnosis not present

## 2019-12-27 DIAGNOSIS — K219 Gastro-esophageal reflux disease without esophagitis: Secondary | ICD-10-CM | POA: Diagnosis not present

## 2019-12-27 DIAGNOSIS — M159 Polyosteoarthritis, unspecified: Secondary | ICD-10-CM | POA: Diagnosis not present

## 2019-12-27 DIAGNOSIS — K760 Fatty (change of) liver, not elsewhere classified: Secondary | ICD-10-CM | POA: Diagnosis not present

## 2019-12-27 DIAGNOSIS — Z7902 Long term (current) use of antithrombotics/antiplatelets: Secondary | ICD-10-CM | POA: Diagnosis not present

## 2019-12-27 DIAGNOSIS — E039 Hypothyroidism, unspecified: Secondary | ICD-10-CM | POA: Diagnosis not present

## 2019-12-27 DIAGNOSIS — R296 Repeated falls: Secondary | ICD-10-CM | POA: Diagnosis not present

## 2019-12-27 DIAGNOSIS — Z87891 Personal history of nicotine dependence: Secondary | ICD-10-CM | POA: Diagnosis not present

## 2019-12-27 DIAGNOSIS — M5440 Lumbago with sciatica, unspecified side: Secondary | ICD-10-CM | POA: Diagnosis not present

## 2019-12-27 DIAGNOSIS — I480 Paroxysmal atrial fibrillation: Secondary | ICD-10-CM | POA: Diagnosis not present

## 2019-12-27 DIAGNOSIS — Z9181 History of falling: Secondary | ICD-10-CM | POA: Diagnosis not present

## 2019-12-27 DIAGNOSIS — Z7984 Long term (current) use of oral hypoglycemic drugs: Secondary | ICD-10-CM | POA: Diagnosis not present

## 2019-12-27 DIAGNOSIS — E538 Deficiency of other specified B group vitamins: Secondary | ICD-10-CM | POA: Diagnosis not present

## 2019-12-27 DIAGNOSIS — E1142 Type 2 diabetes mellitus with diabetic polyneuropathy: Secondary | ICD-10-CM | POA: Diagnosis not present

## 2019-12-29 DIAGNOSIS — E785 Hyperlipidemia, unspecified: Secondary | ICD-10-CM | POA: Insufficient documentation

## 2019-12-30 ENCOUNTER — Ambulatory Visit: Payer: Medicare Other | Admitting: Cardiology

## 2020-01-04 DIAGNOSIS — E1142 Type 2 diabetes mellitus with diabetic polyneuropathy: Secondary | ICD-10-CM | POA: Diagnosis not present

## 2020-01-04 DIAGNOSIS — Z9181 History of falling: Secondary | ICD-10-CM | POA: Diagnosis not present

## 2020-01-04 DIAGNOSIS — R296 Repeated falls: Secondary | ICD-10-CM | POA: Diagnosis not present

## 2020-01-04 DIAGNOSIS — K760 Fatty (change of) liver, not elsewhere classified: Secondary | ICD-10-CM | POA: Diagnosis not present

## 2020-01-04 DIAGNOSIS — Z8673 Personal history of transient ischemic attack (TIA), and cerebral infarction without residual deficits: Secondary | ICD-10-CM | POA: Diagnosis not present

## 2020-01-04 DIAGNOSIS — E559 Vitamin D deficiency, unspecified: Secondary | ICD-10-CM | POA: Diagnosis not present

## 2020-01-04 DIAGNOSIS — Z87891 Personal history of nicotine dependence: Secondary | ICD-10-CM | POA: Diagnosis not present

## 2020-01-04 DIAGNOSIS — K219 Gastro-esophageal reflux disease without esophagitis: Secondary | ICD-10-CM | POA: Diagnosis not present

## 2020-01-04 DIAGNOSIS — M5440 Lumbago with sciatica, unspecified side: Secondary | ICD-10-CM | POA: Diagnosis not present

## 2020-01-04 DIAGNOSIS — M159 Polyosteoarthritis, unspecified: Secondary | ICD-10-CM | POA: Diagnosis not present

## 2020-01-04 DIAGNOSIS — Z7984 Long term (current) use of oral hypoglycemic drugs: Secondary | ICD-10-CM | POA: Diagnosis not present

## 2020-01-04 DIAGNOSIS — E039 Hypothyroidism, unspecified: Secondary | ICD-10-CM | POA: Diagnosis not present

## 2020-01-04 DIAGNOSIS — F32A Depression, unspecified: Secondary | ICD-10-CM | POA: Diagnosis not present

## 2020-01-04 DIAGNOSIS — I10 Essential (primary) hypertension: Secondary | ICD-10-CM | POA: Diagnosis not present

## 2020-01-04 DIAGNOSIS — E1151 Type 2 diabetes mellitus with diabetic peripheral angiopathy without gangrene: Secondary | ICD-10-CM | POA: Diagnosis not present

## 2020-01-04 DIAGNOSIS — Z7902 Long term (current) use of antithrombotics/antiplatelets: Secondary | ICD-10-CM | POA: Diagnosis not present

## 2020-01-04 DIAGNOSIS — Z7901 Long term (current) use of anticoagulants: Secondary | ICD-10-CM | POA: Diagnosis not present

## 2020-01-04 DIAGNOSIS — E538 Deficiency of other specified B group vitamins: Secondary | ICD-10-CM | POA: Diagnosis not present

## 2020-01-04 DIAGNOSIS — J45909 Unspecified asthma, uncomplicated: Secondary | ICD-10-CM | POA: Diagnosis not present

## 2020-01-04 DIAGNOSIS — E782 Mixed hyperlipidemia: Secondary | ICD-10-CM | POA: Diagnosis not present

## 2020-01-04 DIAGNOSIS — I480 Paroxysmal atrial fibrillation: Secondary | ICD-10-CM | POA: Diagnosis not present

## 2020-01-10 DIAGNOSIS — E1142 Type 2 diabetes mellitus with diabetic polyneuropathy: Secondary | ICD-10-CM | POA: Diagnosis not present

## 2020-01-10 DIAGNOSIS — K219 Gastro-esophageal reflux disease without esophagitis: Secondary | ICD-10-CM | POA: Diagnosis not present

## 2020-01-10 DIAGNOSIS — Z7902 Long term (current) use of antithrombotics/antiplatelets: Secondary | ICD-10-CM | POA: Diagnosis not present

## 2020-01-10 DIAGNOSIS — E538 Deficiency of other specified B group vitamins: Secondary | ICD-10-CM | POA: Diagnosis not present

## 2020-01-10 DIAGNOSIS — F32A Depression, unspecified: Secondary | ICD-10-CM | POA: Diagnosis not present

## 2020-01-10 DIAGNOSIS — E1151 Type 2 diabetes mellitus with diabetic peripheral angiopathy without gangrene: Secondary | ICD-10-CM | POA: Diagnosis not present

## 2020-01-10 DIAGNOSIS — R296 Repeated falls: Secondary | ICD-10-CM | POA: Diagnosis not present

## 2020-01-10 DIAGNOSIS — Z87891 Personal history of nicotine dependence: Secondary | ICD-10-CM | POA: Diagnosis not present

## 2020-01-10 DIAGNOSIS — E782 Mixed hyperlipidemia: Secondary | ICD-10-CM | POA: Diagnosis not present

## 2020-01-10 DIAGNOSIS — E039 Hypothyroidism, unspecified: Secondary | ICD-10-CM | POA: Diagnosis not present

## 2020-01-10 DIAGNOSIS — M5440 Lumbago with sciatica, unspecified side: Secondary | ICD-10-CM | POA: Diagnosis not present

## 2020-01-10 DIAGNOSIS — Z7901 Long term (current) use of anticoagulants: Secondary | ICD-10-CM | POA: Diagnosis not present

## 2020-01-10 DIAGNOSIS — E559 Vitamin D deficiency, unspecified: Secondary | ICD-10-CM | POA: Diagnosis not present

## 2020-01-10 DIAGNOSIS — K760 Fatty (change of) liver, not elsewhere classified: Secondary | ICD-10-CM | POA: Diagnosis not present

## 2020-01-10 DIAGNOSIS — Z8673 Personal history of transient ischemic attack (TIA), and cerebral infarction without residual deficits: Secondary | ICD-10-CM | POA: Diagnosis not present

## 2020-01-10 DIAGNOSIS — M159 Polyosteoarthritis, unspecified: Secondary | ICD-10-CM | POA: Diagnosis not present

## 2020-01-10 DIAGNOSIS — J45909 Unspecified asthma, uncomplicated: Secondary | ICD-10-CM | POA: Diagnosis not present

## 2020-01-10 DIAGNOSIS — I480 Paroxysmal atrial fibrillation: Secondary | ICD-10-CM | POA: Diagnosis not present

## 2020-01-10 DIAGNOSIS — Z9181 History of falling: Secondary | ICD-10-CM | POA: Diagnosis not present

## 2020-01-10 DIAGNOSIS — Z7984 Long term (current) use of oral hypoglycemic drugs: Secondary | ICD-10-CM | POA: Diagnosis not present

## 2020-01-10 DIAGNOSIS — I10 Essential (primary) hypertension: Secondary | ICD-10-CM | POA: Diagnosis not present

## 2020-01-11 ENCOUNTER — Encounter: Payer: Self-pay | Admitting: Cardiology

## 2020-01-11 ENCOUNTER — Other Ambulatory Visit: Payer: Self-pay

## 2020-01-11 ENCOUNTER — Ambulatory Visit (INDEPENDENT_AMBULATORY_CARE_PROVIDER_SITE_OTHER): Payer: Medicare Other | Admitting: Cardiology

## 2020-01-11 VITALS — BP 177/72 | HR 80 | Ht 64.0 in | Wt 122.2 lb

## 2020-01-11 DIAGNOSIS — E1169 Type 2 diabetes mellitus with other specified complication: Secondary | ICD-10-CM

## 2020-01-11 DIAGNOSIS — Z8673 Personal history of transient ischemic attack (TIA), and cerebral infarction without residual deficits: Secondary | ICD-10-CM | POA: Diagnosis not present

## 2020-01-11 DIAGNOSIS — I2510 Atherosclerotic heart disease of native coronary artery without angina pectoris: Secondary | ICD-10-CM

## 2020-01-11 DIAGNOSIS — I10 Essential (primary) hypertension: Secondary | ICD-10-CM | POA: Diagnosis not present

## 2020-01-11 DIAGNOSIS — I480 Paroxysmal atrial fibrillation: Secondary | ICD-10-CM | POA: Diagnosis not present

## 2020-01-11 DIAGNOSIS — E785 Hyperlipidemia, unspecified: Secondary | ICD-10-CM

## 2020-01-11 NOTE — Progress Notes (Signed)
Cardiology Office Note:    Date:  01/11/2020   ID:  Elaine Mathews, DOB September 26, 1933, MRN [id number]  PCP:  Lucianne Lei, MD  Cardiologist:  Garwin Brothers, MD   Referring MD: Lucianne Lei, MD    ASSESSMENT:    1. Paroxysmal atrial fibrillation (HCC)   2. Essential hypertension   3. Coronary artery disease involving native coronary artery of native heart without angina pectoris   4. Type 2 diabetes mellitus with hyperlipidemia (HCC)   5. History of stroke    PLAN:    In order of problems listed above:  1. Coronary artery disease: Secondary prevention stressed with the patient.  Importance of compliance with diet medication stressed and she vocalized understanding. 2. Essential hypertension: Blood pressure stable and she has an element of whitecoat hypertension.  She discussed this with me at length. 3. Diabetes mellitus: Managed by primary care physician and diet emphasized 4. Mixed dyslipidemia: I reviewed lipids with her and they are fine and she will continue current medications. 5. Paroxysmal atrial fibrillation and history of stroke:I discussed with the patient atrial fibrillation, disease process. Management and therapy including rate and rhythm control, anticoagulation benefits and potential risks were discussed extensively with the patient. Patient had multiple questions which were answered to patient's satisfaction.  Her evaluation with 1 month monitoring revealed 2 very brief episodes of atrial fibrillation.  I am not going to pursue any antiarrhythmic therapy in view of the fact that she may have issues with bradycardia with such medications.  Currently she is asymptomatic with this and is on anticoagulation.  I discussed this with her and she is agreeable. 6. Patient will be seen in follow-up appointment in 6 months or earlier if the patient has any concerns    Medication Adjustments/Labs and Tests Ordered: Current medicines are reviewed at length with the patient today.   Concerns regarding medicines are outlined above.  No orders of the defined types were placed in this encounter.  No orders of the defined types were placed in this encounter.    No chief complaint on file.    History of Present Illness:    Elaine Mathews is a 86 y.o. female.  Patient has past medical history of coronary artery disease, essential hypertension diabetes mellitus and mixed dyslipidemia.  She has paroxysmal atrial fibrillation and history of stroke.  She denies any problems at this time and takes care of activities of daily living.  She ambulates with a walker.  Her son accompanies her for this visit.  At the time of my evaluation, the patient is alert awake oriented and in no distress.   Past Medical History:  Diagnosis Date  . A-fib (HCC)   . Abrasion, knee, left, initial encounter 10/28/2019  . AF (paroxysmal atrial fibrillation) (HCC) 10/28/2019  . Allergic rhinosinusitis   . Arthritis   . Asthma   . Ataxia    With generalized weakness in legs  . CAD (coronary artery disease)   . CAD in native artery 10/28/2019  . Cataract, bilateral   . CHF (congestive heart failure) (HCC)   . Chronic diastolic CHF (congestive heart failure) (HCC) 10/28/2019  . Controlled type 2 diabetes with neuropathy (HCC) 01/06/2017  . Coronary artery disease   . Dementia without behavioral disturbance (HCC) 10/28/2019  . Diabetes mellitus due to underlying condition with unspecified complications (HCC) 11/17/2019  . Diabetes mellitus without complication (HCC)   . Diarrhea 10/28/2019  . DM2 (diabetes mellitus, type 2) (HCC) 10/28/2019  .  Dysrhythmia   . Elevated LFTs 07/12/2018  . Elevated liver enzymes   . Essential hypertension 11/13/2017  . Fatty liver   . Foot lesion   . History of stroke 10/28/2019  . Hyperlipidemia   . Hypertension   . Hypothyroidism   . Leg pain, bilateral   . MI (myocardial infarction) (HCC)   . Mild asthma   . Mild vascular neurocognitive disorder (HCC) 11/13/2018   . Nail dystrophy 03/08/2019  . Neuromuscular disorder (HCC)   . NSTEMI (non-ST elevated myocardial infarction) (HCC) 07/12/2018  . Paroxysmal atrial fibrillation (HCC) 11/13/2017  . Peripheral vascular disease (HCC)   . Pre-ulcerative calluses 01/06/2017  . Prolonged QT interval 10/28/2019  . PVD (peripheral vascular disease) (HCC)   . Seizure (HCC)   . Seizure disorder (HCC) 10/28/2019  . Sinus pause 07/13/2018  . Spleen hematoma without rupture of capsule, without open wound into cavity 07/15/2018  . Stroke [facility])    Right parietal  . Stroke (HCC)   . Syncope and collapse 10/28/2019  . Syncope, vasovagal 07/15/2018  . Thyroid disease   . Type 2 diabetes mellitus with hyperlipidemia (HCC) 10/28/2019  . Vitamin D deficiency     Past Surgical History:  Procedure Laterality Date  . APPENDECTOMY    . CATARACT EXTRACTION Bilateral   . CHOLECYSTECTOMY    . CORONARY ANGIOPLASTY    . CORONARY STENT INTERVENTION N/A 07/13/2018   Procedure: CORONARY STENT INTERVENTION;  Surgeon: Yvonne Kendall, MD;  Location: [facility] INVASIVE [facility];  Service: Cardiovascular;  Laterality: N/A;  . INTRAVASCULAR PRESSURE WIRE/FFR STUDY N/A 07/13/2018   Procedure: INTRAVASCULAR PRESSURE WIRE/FFR STUDY;  Surgeon: Yvonne Kendall, MD;  Location: [facility] INVASIVE CV LAB;  Service: Cardiovascular;  Laterality: N/A;  . LEFT HEART CATH AND CORONARY ANGIOGRAPHY N/A 07/13/2018   Procedure: LEFT HEART CATH AND CORONARY ANGIOGRAPHY;  Surgeon: Yvonne Kendall, MD;  Location: [facility] INVASIVE CV LAB;  Service: Cardiovascular;  Laterality: N/A;  . TONSILLECTOMY      Current Medications: Current Meds  Medication Sig  . atorvastatin (LIPITOR) 40 MG tablet Take 1 tablet (40 mg total) by mouth daily at 6 PM.  . clopidogrel (PLAVIX) 75 MG tablet Take 1 tablet (75 mg total) by mouth daily with breakfast.  . ezetimibe (ZETIA) 10 MG tablet Take 10 mg by mouth every evening.   . famotidine (PEPCID) 40 MG tablet Take 40 mg by mouth daily.  Marland Kitchen  levETIRAcetam (KEPPRA) 250 MG tablet TAKE 1 TABLET BY MOUTH EVERY MORNING ANDTAKE 2 TABLETS EVERY EVENING  . memantine (NAMENDA) 10 MG tablet Take 10 mg by mouth 2 (two) times daily.  . metFORMIN (GLUCOPHAGE) 500 MG tablet Take 250 mg by mouth 2 (two) times daily.   . Misc Natural Products (GLUCOSAMINE CHONDROITIN TRIPLE) TABS Take 2 tablets by mouth every evening.  . montelukast (SINGULAIR) 10 MG tablet Take 10 mg by mouth every evening.   . nitroGLYCERIN (NITROSTAT) 0.4 MG SL tablet Place 1 tablet (0.4 mg total) under the tongue every 5 (five) minutes as needed for chest pain.  . polyvinyl alcohol (ARTIFICIAL TEARS) 1.4 % ophthalmic solution Place 1 drop into both eyes as needed for dry eyes.  . potassium chloride (KLOR-CON) 10 MEQ tablet Take 1 tablet (10 mEq total) by mouth daily.  . Prenatal Vit-Fe Fumarate-FA (PRENATAL VITAMIN PO) Take 1 tablet by mouth daily.  . Probiotic Product [facility]) CAPS Take 1 capsule by mouth daily.  . rivaroxaban (XARELTO) 20 MG TABS tablet Take 1 tablet (20 mg total)  by mouth daily with supper.  . sertraline (ZOLOFT) 50 MG tablet Take 50 mg by mouth daily.  . sodium chloride 1 g tablet Take 1 g by mouth daily.  . Vitamin D, Ergocalciferol, (DRISDOL) 1.25 MG (50000 UNIT) CAPS capsule Take 50,000 Units by mouth every Wednesday.     Allergies:   Codeine, Other, Codeine, Penicillins, and Penicillins   Social History   Socioeconomic History  . Marital status: Divorced    Spouse name: Not on file  . Number of children: Not on file  . Years of education: Not on file  . Highest education level: Not on file  Occupational History  . Not on file  Tobacco Use  . Smoking status: Never Smoker  . Smokeless tobacco: Never Used  Vaping Use  . Vaping Use: Never used  Substance and Sexual Activity  . Alcohol use: Not Currently  . Drug use: No  . Sexual activity: Not Currently    Partners: Male  Other Topics Concern  . Not on file  Social History Narrative    ** Merged History Encounter **       Lives with  Highest level of edu-  Right handed    Social Determinants of Health   Financial Resource Strain:   . Difficulty of Paying Living Expenses: Not on file  Food Insecurity:   . Worried About Programme researcher, broadcasting/film/video in the Last Year: Not on file  . Ran Out of Food in the Last Year: Not on file  Transportation Needs:   . Lack of Transportation (Medical): Not on file  . Lack of Transportation (Non-Medical): Not on file  Physical Activity:   . Days of Exercise per Week: Not on file  . Minutes of Exercise per Session: Not on file  Stress:   . Feeling of Stress : Not on file  Social Connections:   . Frequency of Communication with Friends and Family: Not on file  . Frequency of Social Gatherings with Friends and Family: Not on file  . Attends Religious Services: Not on file  . Active Member of Clubs or Organizations: Not on file  . Attends Banker Meetings: Not on file  . Marital Status: Not on file     Family History: The patient's family history includes CAD in her father and another family member; COPD in her son; Diabetes in her mother; Heart attack in her mother and son; Hypercholesterolemia in her mother; Hypertension in her father and mother; Stroke in her father and mother.  ROS:   Please see the history of present illness.    All other systems reviewed and are negative.  EKGs/Labs/Other Studies Reviewed:    The following studies were reviewed today: EVENT MONITOR REPORT:   Patient was monitored from 11/17/2019 to 12/16/2019. Indication:                    Syncope and collapse Ordering physician:  Garwin Brothers, MD  Referring physician:        Garwin Brothers, MD    Baseline rhythm: Sinus  Minimum heart rate: 60 BPM.   Maximal heart rate 129 PM.  Atrial arrhythmia: Rare PACs.  2 episodes of very brief paroxysms of atrial fibrillation.  Ventricular arrhythmia: None significant rare  PVCs  Conduction abnormality: None significant  Symptoms: None significant   Conclusion:  2 episodes of very brief paroxysms of atrial fibrillation.  Interpreting  cardiologist: Garwin Brothers, MD  Date: 12/29/2019 12:36 PM  Summary:  Right Carotid: Velocities in the right ICA are consistent with a 40-59%         stenosis.   Left Carotid: Velocities in the left ICA are consistent with a 1-39%  stenosis.   Vertebrals: Bilateral vertebral arteries demonstrate antegrade flow.   *See table(s) above for measurements and observations.      Electronically signed by Lemar Livings MD on 10/29/2019 at 11:56:50 AM.   End, Cristal Deer, MD (Primary)    Procedures  CORONARY STENT INTERVENTION  INTRAVASCULAR PRESSURE WIRE/FFR STUDY  LEFT HEART CATH AND CORONARY ANGIOGRAPHY  Conclusion  Conclusions: 1. Two vessel coronary artery disease with multifocal LAD (40% proximal, 60-70% mid, and 80% apical stenoses). Proximal and mid LAD disease is highly significant by DFR (0.74). Culpril lesion for NSTEMI is likely occluded small OM2 branch, which is too small for PCI. 2. Normal left ventricular systolic function with mildly elevated filling pressure (LVEDP 15-20 mmHg). 3. Successful DFR-guided PCI to the proximal/mid LAD using a Resolute Onyx 2.75 x 18 mm drug-eluting stent with 0% residual stenosis and TIMI-3 flow.  Recommendations 1. Medical therapy for occluded OM2 branch and apical LAD disease. 2. Restart heparin 2 hours after TR band removal, given history of paroxysmal atrial fibrillation. If no bleeding complications, recommend discontinuation of aspirin tomorrow and initiation of rivaroxaban 20 mg daily. Rivaroxaban and clopidogrel should be continued for at least 12 months, if possible. 3. Aggressive secondary prevention.  Yvonne Kendall, MD [facility] Pager: [phone]      Recent Labs: 10/28/2019: B Natriuretic Peptide 114.6 10/29/2019: ALT  26; Magnesium 1.6 11/17/2019: BUN 21; Creatinine, Ser 0.79; Hemoglobin 12.2; Platelets 202; Potassium 4.3; Sodium 140; TSH 0.881  Recent Lipid Panel    Component Value Date/Time   CHOL 137 11/17/2019 1648   TRIG 139 11/17/2019 1648   HDL 48 11/17/2019 1648   CHOLHDL 2.9 11/17/2019 1648   CHOLHDL 3.7 07/13/2018 0234   VLDL 23 07/13/2018 0234   LDLCALC 65 11/17/2019 1648    Physical Exam:    VS:  BP (!) 177/72   Pulse 80   Ht 5\' 4"  (1.626 m)   Wt 122 lb 3.2 oz (55.4 kg)   SpO2 97%   BMI 20.98 kg/m     Wt Readings from Last 3 Encounters:  01/11/20 122 lb 3.2 oz (55.4 kg)  11/17/19 117 lb 3.2 oz (53.2 kg)  10/30/19 122 lb 12.8 oz (55.7 kg)     GEN: Patient is in no acute distress HEENT: Normal NECK: No JVD; No carotid bruits LYMPHATICS: No lymphadenopathy CARDIAC: Hear sounds regular, 2/6 systolic murmur at the apex. RESPIRATORY:  Clear to auscultation without rales, wheezing or rhonchi  ABDOMEN: Soft, non-tender, non-distended MUSCULOSKELETAL:  No edema; No deformity  SKIN: Warm and dry NEUROLOGIC:  Alert and oriented x 3 PSYCHIATRIC:  Normal affect   Signed, 11/01/19, MD  01/11/2020 1:43 PM    [facility]

## 2020-01-11 NOTE — Patient Instructions (Signed)

## 2020-01-18 ENCOUNTER — Other Ambulatory Visit: Payer: Self-pay

## 2020-01-18 ENCOUNTER — Inpatient Hospital Stay (HOSPITAL_COMMUNITY)
Admission: EM | Admit: 2020-01-18 | Discharge: 2020-01-21 | DRG: 690 | Disposition: A | Discharge status: Home Health | Payer: Medicare Other | Attending: Internal Medicine | Admitting: Internal Medicine

## 2020-01-18 ENCOUNTER — Emergency Department (HOSPITAL_COMMUNITY): Payer: Medicare Other

## 2020-01-18 DIAGNOSIS — Z9841 Cataract extraction status, right eye: Secondary | ICD-10-CM

## 2020-01-18 DIAGNOSIS — I10 Essential (primary) hypertension: Secondary | ICD-10-CM | POA: Diagnosis not present

## 2020-01-18 DIAGNOSIS — Z825 Family history of asthma and other chronic lower respiratory diseases: Secondary | ICD-10-CM | POA: Diagnosis not present

## 2020-01-18 DIAGNOSIS — E785 Hyperlipidemia, unspecified: Secondary | ICD-10-CM | POA: Diagnosis not present

## 2020-01-18 DIAGNOSIS — Z87891 Personal history of nicotine dependence: Secondary | ICD-10-CM | POA: Diagnosis not present

## 2020-01-18 DIAGNOSIS — Z8673 Personal history of transient ischemic attack (TIA), and cerebral infarction without residual deficits: Secondary | ICD-10-CM | POA: Diagnosis not present

## 2020-01-18 DIAGNOSIS — I5032 Chronic diastolic (congestive) heart failure: Secondary | ICD-10-CM | POA: Diagnosis present

## 2020-01-18 DIAGNOSIS — Z7984 Long term (current) use of oral hypoglycemic drugs: Secondary | ICD-10-CM | POA: Diagnosis not present

## 2020-01-18 DIAGNOSIS — K760 Fatty (change of) liver, not elsewhere classified: Secondary | ICD-10-CM | POA: Diagnosis present

## 2020-01-18 DIAGNOSIS — Z66 Do not resuscitate: Secondary | ICD-10-CM | POA: Diagnosis not present

## 2020-01-18 DIAGNOSIS — I252 Old myocardial infarction: Secondary | ICD-10-CM

## 2020-01-18 DIAGNOSIS — R079 Chest pain, unspecified: Secondary | ICD-10-CM | POA: Diagnosis not present

## 2020-01-18 DIAGNOSIS — M159 Polyosteoarthritis, unspecified: Secondary | ICD-10-CM | POA: Diagnosis not present

## 2020-01-18 DIAGNOSIS — N390 Urinary tract infection, site not specified: Principal | ICD-10-CM | POA: Diagnosis present

## 2020-01-18 DIAGNOSIS — R296 Repeated falls: Secondary | ICD-10-CM | POA: Diagnosis not present

## 2020-01-18 DIAGNOSIS — Z7901 Long term (current) use of anticoagulants: Secondary | ICD-10-CM

## 2020-01-18 DIAGNOSIS — E559 Vitamin D deficiency, unspecified: Secondary | ICD-10-CM | POA: Diagnosis present

## 2020-01-18 DIAGNOSIS — Z20822 Contact with and (suspected) exposure to covid-19: Secondary | ICD-10-CM | POA: Diagnosis not present

## 2020-01-18 DIAGNOSIS — R42 Dizziness and giddiness: Secondary | ICD-10-CM | POA: Diagnosis not present

## 2020-01-18 DIAGNOSIS — E871 Hypo-osmolality and hyponatremia: Secondary | ICD-10-CM | POA: Diagnosis present

## 2020-01-18 DIAGNOSIS — Z823 Family history of stroke: Secondary | ICD-10-CM

## 2020-01-18 DIAGNOSIS — F32A Depression, unspecified: Secondary | ICD-10-CM | POA: Diagnosis not present

## 2020-01-18 DIAGNOSIS — Z885 Allergy status to narcotic agent status: Secondary | ICD-10-CM

## 2020-01-18 DIAGNOSIS — J309 Allergic rhinitis, unspecified: Secondary | ICD-10-CM | POA: Diagnosis not present

## 2020-01-18 DIAGNOSIS — Z8249 Family history of ischemic heart disease and other diseases of the circulatory system: Secondary | ICD-10-CM | POA: Diagnosis not present

## 2020-01-18 DIAGNOSIS — M5440 Lumbago with sciatica, unspecified side: Secondary | ICD-10-CM | POA: Diagnosis not present

## 2020-01-18 DIAGNOSIS — E1151 Type 2 diabetes mellitus with diabetic peripheral angiopathy without gangrene: Secondary | ICD-10-CM | POA: Diagnosis present

## 2020-01-18 DIAGNOSIS — F0390 Unspecified dementia without behavioral disturbance: Secondary | ICD-10-CM | POA: Diagnosis present

## 2020-01-18 DIAGNOSIS — J45909 Unspecified asthma, uncomplicated: Secondary | ICD-10-CM | POA: Diagnosis not present

## 2020-01-18 DIAGNOSIS — Z79899 Other long term (current) drug therapy: Secondary | ICD-10-CM

## 2020-01-18 DIAGNOSIS — Z833 Family history of diabetes mellitus: Secondary | ICD-10-CM

## 2020-01-18 DIAGNOSIS — I499 Cardiac arrhythmia, unspecified: Secondary | ICD-10-CM | POA: Diagnosis not present

## 2020-01-18 DIAGNOSIS — Z83438 Family history of other disorder of lipoprotein metabolism and other lipidemia: Secondary | ICD-10-CM

## 2020-01-18 DIAGNOSIS — Z743 Need for continuous supervision: Secondary | ICD-10-CM | POA: Diagnosis not present

## 2020-01-18 DIAGNOSIS — I2510 Atherosclerotic heart disease of native coronary artery without angina pectoris: Secondary | ICD-10-CM | POA: Diagnosis present

## 2020-01-18 DIAGNOSIS — E538 Deficiency of other specified B group vitamins: Secondary | ICD-10-CM | POA: Diagnosis not present

## 2020-01-18 DIAGNOSIS — Z955 Presence of coronary angioplasty implant and graft: Secondary | ICD-10-CM

## 2020-01-18 DIAGNOSIS — E782 Mixed hyperlipidemia: Secondary | ICD-10-CM | POA: Diagnosis not present

## 2020-01-18 DIAGNOSIS — R072 Precordial pain: Secondary | ICD-10-CM | POA: Diagnosis not present

## 2020-01-18 DIAGNOSIS — E039 Hypothyroidism, unspecified: Secondary | ICD-10-CM | POA: Diagnosis not present

## 2020-01-18 DIAGNOSIS — G40909 Epilepsy, unspecified, not intractable, without status epilepticus: Secondary | ICD-10-CM | POA: Diagnosis present

## 2020-01-18 DIAGNOSIS — Z880 Allergy status to penicillin: Secondary | ICD-10-CM

## 2020-01-18 DIAGNOSIS — E7800 Pure hypercholesterolemia, unspecified: Secondary | ICD-10-CM | POA: Diagnosis present

## 2020-01-18 DIAGNOSIS — N3000 Acute cystitis without hematuria: Secondary | ICD-10-CM | POA: Diagnosis not present

## 2020-01-18 DIAGNOSIS — I110 Hypertensive heart disease with heart failure: Secondary | ICD-10-CM | POA: Diagnosis present

## 2020-01-18 DIAGNOSIS — I480 Paroxysmal atrial fibrillation: Secondary | ICD-10-CM | POA: Diagnosis present

## 2020-01-18 DIAGNOSIS — J90 Pleural effusion, not elsewhere classified: Secondary | ICD-10-CM | POA: Diagnosis not present

## 2020-01-18 DIAGNOSIS — Z7902 Long term (current) use of antithrombotics/antiplatelets: Secondary | ICD-10-CM

## 2020-01-18 DIAGNOSIS — E1142 Type 2 diabetes mellitus with diabetic polyneuropathy: Secondary | ICD-10-CM | POA: Diagnosis not present

## 2020-01-18 DIAGNOSIS — Z9181 History of falling: Secondary | ICD-10-CM | POA: Diagnosis not present

## 2020-01-18 DIAGNOSIS — R0989 Other specified symptoms and signs involving the circulatory and respiratory systems: Secondary | ICD-10-CM | POA: Diagnosis present

## 2020-01-18 DIAGNOSIS — K219 Gastro-esophageal reflux disease without esophagitis: Secondary | ICD-10-CM | POA: Diagnosis not present

## 2020-01-18 DIAGNOSIS — R0789 Other chest pain: Secondary | ICD-10-CM | POA: Diagnosis not present

## 2020-01-18 DIAGNOSIS — R6889 Other general symptoms and signs: Secondary | ICD-10-CM | POA: Diagnosis not present

## 2020-01-18 DIAGNOSIS — Z9842 Cataract extraction status, left eye: Secondary | ICD-10-CM

## 2020-01-18 HISTORY — DX: Precordial pain: R07.2

## 2020-01-18 LAB — BASIC METABOLIC PANEL
Anion gap: 12 (ref 5–15)
BUN: 7 mg/dL — ABNORMAL LOW (ref 8–23)
CO2: 25 mmol/L (ref 22–32)
Calcium: 9.4 mg/dL (ref 8.9–10.3)
Chloride: 95 mmol/L — ABNORMAL LOW (ref 98–111)
Creatinine, Ser: 0.54 mg/dL (ref 0.44–1.00)
GFR, Estimated: >60 mL/min (ref >60)
Glucose, Bld: 123 mg/dL — ABNORMAL HIGH (ref 70–99)
Potassium: 3.7 mmol/L (ref 3.5–5.1)
Sodium: 132 mmol/L — ABNORMAL LOW (ref 135–145)

## 2020-01-18 LAB — CBC
HCT: 37.3 % (ref 36.0–46.0)
Hemoglobin: 12.1 g/dL (ref 12.0–15.0)
MCH: 29.2 pg (ref 26.0–34.0)
MCHC: 32.4 g/dL (ref 30.0–36.0)
MCV: 89.9 fL (ref 80.0–100.0)
Platelets: 167 10*3/uL (ref 150–400)
RBC: 4.15 MIL/uL (ref 3.87–5.11)
RDW: 14.8 % (ref 11.5–15.5)
WBC: 6.5 10*3/uL (ref 4.0–10.5)
nRBC: 0 % (ref 0.0–0.2)

## 2020-01-18 LAB — URINALYSIS, ROUTINE W REFLEX MICROSCOPIC
Bilirubin Urine: NEGATIVE
Glucose, UA: NEGATIVE mg/dL
Hgb urine dipstick: NEGATIVE
Ketones, ur: NEGATIVE mg/dL
Nitrite: NEGATIVE
Protein, ur: 30 mg/dL — AB
Specific Gravity, Urine: 1.018 (ref 1.005–1.030)
WBC, UA: >50 WBC/hpf — ABNORMAL HIGH (ref 0–5)
pH: 5 (ref 5.0–8.0)

## 2020-01-18 LAB — RESP PANEL BY RT-PCR (FLU A&B, COVID) ARPGX2
Influenza A by PCR: NEGATIVE
Influenza B by PCR: NEGATIVE
SARS Coronavirus 2 by RT PCR: NEGATIVE

## 2020-01-18 LAB — VITAMIN B12: Vitamin B-12: 320 pg/mL (ref 180–914)

## 2020-01-18 LAB — TROPONIN I (HIGH SENSITIVITY)
Troponin I (High Sensitivity): 7 ng/L (ref <18)
Troponin I (High Sensitivity): 6 ng/L (ref <18)

## 2020-01-18 MED ORDER — MEMANTINE HCL 10 MG PO TABS
10.0000 mg | ORAL_TABLET | Freq: Two times a day (BID) | ORAL | Status: DC
  Administered 2020-01-18 – 2020-01-21 (×6): 10 mg via ORAL
  Filled 2020-01-18 (×7): qty 1

## 2020-01-18 MED ORDER — METFORMIN HCL 500 MG PO TABS
250.0000 mg | ORAL_TABLET | Freq: Two times a day (BID) | ORAL | Status: DC
  Administered 2020-01-18 – 2020-01-20 (×3): 250 mg via ORAL
  Filled 2020-01-18 (×4): qty 1

## 2020-01-18 MED ORDER — POTASSIUM CHLORIDE CRYS ER 10 MEQ PO TBCR
10.0000 meq | EXTENDED_RELEASE_TABLET | Freq: Every day | ORAL | Status: DC
  Administered 2020-01-19 – 2020-01-21 (×3): 10 meq via ORAL
  Filled 2020-01-18 (×5): qty 1

## 2020-01-18 MED ORDER — EZETIMIBE 10 MG PO TABS
10.0000 mg | ORAL_TABLET | Freq: Every evening | ORAL | Status: DC
  Administered 2020-01-19 – 2020-01-20 (×2): 10 mg via ORAL
  Filled 2020-01-18 (×3): qty 1

## 2020-01-18 MED ORDER — PRENATAL VITAMIN 27-0.8 MG PO TABS
ORAL_TABLET | Freq: Every day | ORAL | Status: DC
Start: 2020-01-18 — End: 2020-01-18

## 2020-01-18 MED ORDER — MONTELUKAST SODIUM 10 MG PO TABS
10.0000 mg | ORAL_TABLET | Freq: Every evening | ORAL | Status: DC
  Administered 2020-01-19 – 2020-01-20 (×2): 10 mg via ORAL
  Filled 2020-01-18 (×3): qty 1

## 2020-01-18 MED ORDER — ADULT MULTIVITAMIN W/MINERALS CH
1.0000 | ORAL_TABLET | Freq: Every day | ORAL | Status: DC
  Administered 2020-01-19 – 2020-01-21 (×3): 1 via ORAL
  Filled 2020-01-18 (×3): qty 1

## 2020-01-18 MED ORDER — LEVETIRACETAM 250 MG PO TABS
250.0000 mg | ORAL_TABLET | Freq: Two times a day (BID) | ORAL | Status: DC
  Administered 2020-01-18 – 2020-01-21 (×6): 250 mg via ORAL
  Filled 2020-01-18 (×7): qty 1

## 2020-01-18 MED ORDER — ATORVASTATIN CALCIUM 40 MG PO TABS
40.0000 mg | ORAL_TABLET | Freq: Every day | ORAL | Status: DC
  Administered 2020-01-19 – 2020-01-20 (×2): 40 mg via ORAL
  Filled 2020-01-18 (×2): qty 1

## 2020-01-18 MED ORDER — FAMOTIDINE 20 MG PO TABS
40.0000 mg | ORAL_TABLET | Freq: Every day | ORAL | Status: DC
  Administered 2020-01-19 – 2020-01-21 (×3): 40 mg via ORAL
  Filled 2020-01-18 (×3): qty 2

## 2020-01-18 MED ORDER — CLOPIDOGREL BISULFATE 75 MG PO TABS
75.0000 mg | ORAL_TABLET | Freq: Every day | ORAL | Status: DC
  Administered 2020-01-19 – 2020-01-21 (×3): 75 mg via ORAL
  Filled 2020-01-18 (×3): qty 1

## 2020-01-18 MED ORDER — RIVAROXABAN 20 MG PO TABS
20.0000 mg | ORAL_TABLET | Freq: Every day | ORAL | Status: DC
  Administered 2020-01-19 – 2020-01-20 (×2): 20 mg via ORAL
  Filled 2020-01-18 (×3): qty 1

## 2020-01-18 MED ORDER — SERTRALINE HCL 50 MG PO TABS
50.0000 mg | ORAL_TABLET | Freq: Every day | ORAL | Status: DC
  Administered 2020-01-19 – 2020-01-21 (×3): 50 mg via ORAL
  Filled 2020-01-18 (×3): qty 1

## 2020-01-18 MED ORDER — FLORAJEN3 PO CAPS: 1.0000 | ORAL_CAPSULE | Freq: Every day | ORAL | Status: DC

## 2020-01-18 MED ORDER — VITAMIN D (ERGOCALCIFEROL) 1.25 MG (50000 UNIT) PO CAPS
50000.0000 [IU] | ORAL_CAPSULE | ORAL | Status: DC
  Administered 2020-01-19: 50000 [IU] via ORAL
  Filled 2020-01-18 (×2): qty 1

## 2020-01-18 MED ORDER — CARVEDILOL 3.125 MG PO TABS
3.1250 mg | ORAL_TABLET | Freq: Two times a day (BID) | ORAL | Status: DC
  Administered 2020-01-19 – 2020-01-21 (×5): 3.125 mg via ORAL
  Filled 2020-01-18 (×7): qty 1

## 2020-01-18 MED ORDER — RISAQUAD PO CAPS
1.0000 | ORAL_CAPSULE | Freq: Every day | ORAL | Status: DC
  Administered 2020-01-19 – 2020-01-21 (×3): 1 via ORAL
  Filled 2020-01-18 (×3): qty 1

## 2020-01-18 MED ORDER — POLYVINYL ALCOHOL 1.4 % OP SOLN
1.0000 [drp] | OPHTHALMIC | Status: DC | PRN
  Filled 2020-01-18: qty 15

## 2020-01-18 MED ORDER — SODIUM CHLORIDE 1 G PO TABS
1.0000 g | ORAL_TABLET | Freq: Every day | ORAL | Status: DC
  Administered 2020-01-19 – 2020-01-21 (×4): 1 g via ORAL
  Filled 2020-01-18 (×4): qty 1

## 2020-01-18 MED ORDER — ACETAMINOPHEN 325 MG PO TABS: 650.0000 mg | ORAL_TABLET | ORAL | Status: DC | PRN

## 2020-01-18 NOTE — H&P (Addendum)
History and Physical    Elaine Mathews LNL:[id number] DOB: 26-Jan-1934 DOA: 01/18/2020  PCP: Lucianne Lei, MD (Confirm with patient/family/NH records and if not entered, this has to be entered at [facility] point of entry) Patient coming from: Home  I have personally briefly reviewed patient's old medical records in [facility]  Chief Complaint: Chest tightness  HPI: Elaine Mathews is a 86 y.o. female with medical history significant of CAD with LAD stenting x1 last year, make ambulation dysfunction, PAF on Xarelto, labile BP of BP medications, mild dementia, seizure disorder, IIDM, presented with multiple complaints.  This morning after breakfast while sitting beside table, patient started to feel tightness in her chest, 7-8 over 10, localized retrosternal, whole episode lasted about 15 minutes and resolved by its own.  Denied any shortness of breath, no nauseous vomiting were diaphoresis.  Meantime, she also experienced wobbly gait, some lightheadedness and temporary confusion which all resolved.  Patient has a chronic ambulation dysfunction, started use rolling walker since last year.  It appears that the patient has had labile blood pressure, has been following with her cardiologist who discontinued Cardizem and diuresis on last admission.  ED Course: Troponin negative x2, blood pressure significantly elevated.  EKG is reassuring.  Review of Systems: As per HPI otherwise 14 point review of systems negative.    Past Medical History:  Diagnosis Date  . A-fib (HCC)   . Abrasion, knee, left, initial encounter 10/28/2019  . AF (paroxysmal atrial fibrillation) (HCC) 10/28/2019  . Allergic rhinosinusitis   . Arthritis   . Asthma   . Ataxia    With generalized weakness in legs  . CAD (coronary artery disease)   . CAD in native artery 10/28/2019  . Cataract, bilateral   . CHF (congestive heart failure) (HCC)   . Chronic diastolic CHF (congestive heart failure) (HCC) 10/28/2019  . Controlled  type 2 diabetes with neuropathy (HCC) 01/06/2017  . Coronary artery disease   . Dementia without behavioral disturbance (HCC) 10/28/2019  . Diabetes mellitus due to underlying condition with unspecified complications (HCC) 11/17/2019  . Diabetes mellitus without complication (HCC)   . Diarrhea 10/28/2019  . DM2 (diabetes mellitus, type 2) (HCC) 10/28/2019  . Dysrhythmia   . Elevated LFTs 07/12/2018  . Elevated liver enzymes   . Essential hypertension 11/13/2017  . Fatty liver   . Foot lesion   . History of stroke 10/28/2019  . Hyperlipidemia   . Hypertension   . Hypothyroidism   . Leg pain, bilateral   . MI (myocardial infarction) (HCC)   . Mild asthma   . Mild vascular neurocognitive disorder (HCC) 11/13/2018  . Nail dystrophy 03/08/2019  . Neuromuscular disorder (HCC)   . NSTEMI (non-ST elevated myocardial infarction) (HCC) 07/12/2018  . Paroxysmal atrial fibrillation (HCC) 11/13/2017  . Peripheral vascular disease (HCC)   . Pre-ulcerative calluses 01/06/2017  . Prolonged QT interval 10/28/2019  . PVD (peripheral vascular disease) (HCC)   . Seizure (HCC)   . Seizure disorder (HCC) 10/28/2019  . Sinus pause 07/13/2018  . Spleen hematoma without rupture of capsule, without open wound into cavity 07/15/2018  . Stroke [facility])    Right parietal  . Stroke (HCC)   . Syncope and collapse 10/28/2019  . Syncope, vasovagal 07/15/2018  . Thyroid disease   . Type 2 diabetes mellitus with hyperlipidemia (HCC) 10/28/2019  . Vitamin D deficiency     Past Surgical History:  Procedure Laterality Date  . APPENDECTOMY    . CATARACT EXTRACTION  Bilateral   . CHOLECYSTECTOMY    . CORONARY ANGIOPLASTY    . CORONARY STENT INTERVENTION N/A 07/13/2018   Procedure: CORONARY STENT INTERVENTION;  Surgeon: Yvonne Kendall, MD;  Location: [facility] INVASIVE CV LAB;  Service: Cardiovascular;  Laterality: N/A;  . INTRAVASCULAR PRESSURE WIRE/FFR STUDY N/A 07/13/2018   Procedure: INTRAVASCULAR PRESSURE WIRE/FFR STUDY;  Surgeon:  Yvonne Kendall, MD;  Location: [facility] INVASIVE CV LAB;  Service: Cardiovascular;  Laterality: N/A;  . LEFT HEART CATH AND CORONARY ANGIOGRAPHY N/A 07/13/2018   Procedure: LEFT HEART CATH AND CORONARY ANGIOGRAPHY;  Surgeon: Yvonne Kendall, MD;  Location: [facility] INVASIVE CV LAB;  Service: Cardiovascular;  Laterality: N/A;  . TONSILLECTOMY       reports that she has never smoked. She has never used smokeless tobacco. She reports previous alcohol use. She reports that she does not use drugs.  Allergies  Allergen Reactions  . Codeine Other (See Comments)    Possibly a rash  . Other Diarrhea    Reaction to canteloupe  . Codeine Other (See Comments)    Unknown reaction  . Penicillins Other (See Comments)    Unknown reaction Did it involve swelling of the face/tongue/throat, SOB, or low BP? Unknown Did it involve sudden or severe rash/hives, skin peeling, or any reaction on the inside of your mouth or nose? Unknown Did you need to seek medical attention at a hospital or doctor's office? Unknown When did it last happen?unknown (long time ago) If all above answers are "NO", may proceed with cephalosporin use.  Marland Kitchen Penicillins Rash    Family History  Problem Relation Age of Onset  . Diabetes Mother   . CAD Father   . CAD Other   . Hypertension Mother   . Stroke Mother   . Heart attack Mother   . Hypercholesterolemia Mother   . Hypertension Father   . Stroke Father   . Heart attack Son   . COPD Son      Prior to Admission medications   Medication Sig Start Date End Date Taking? Authorizing Provider  atorvastatin (LIPITOR) 40 MG tablet Take 1 tablet (40 mg total) by mouth daily at 6 PM. 09/28/19   Revankar, Aundra Dubin, MD  clopidogrel (PLAVIX) 75 MG tablet Take 1 tablet (75 mg total) by mouth daily with breakfast. 09/28/19   Revankar, Aundra Dubin, MD  ezetimibe (ZETIA) 10 MG tablet Take 10 mg by mouth every evening.     [provider]  famotidine (PEPCID) 40 MG tablet Take 40 mg by  mouth daily.    [provider]  levETIRAcetam (KEPPRA) 250 MG tablet TAKE 1 TABLET BY MOUTH EVERY MORNING ANDTAKE 2 TABLETS EVERY EVENING 03/01/19   Van Clines, MD  memantine (NAMENDA) 10 MG tablet Take 10 mg by mouth 2 (two) times daily.    [provider]  metFORMIN (GLUCOPHAGE) 500 MG tablet Take 250 mg by mouth 2 (two) times daily.     [provider]  Misc Natural Products (GLUCOSAMINE CHONDROITIN TRIPLE) TABS Take 2 tablets by mouth every evening.    [provider]  montelukast (SINGULAIR) 10 MG tablet Take 10 mg by mouth every evening.  10/20/19   [provider]  nitroGLYCERIN (NITROSTAT) 0.4 MG SL tablet Place 1 tablet (0.4 mg total) under the tongue every 5 (five) minutes as needed for chest pain. 09/28/19   Revankar, Aundra Dubin, MD  polyvinyl alcohol (ARTIFICIAL TEARS) 1.4 % ophthalmic solution Place 1 drop into both eyes as needed for dry  eyes.    [provider]  potassium chloride (KLOR-CON) 10 MEQ tablet Take 1 tablet (10 mEq total) by mouth daily. 09/28/19   Revankar, Aundra Dubin, MD  Prenatal Vit-Fe Fumarate-FA (PRENATAL VITAMIN PO) Take 1 tablet by mouth daily.    [provider]  Probiotic Product [facility]) CAPS Take 1 capsule by mouth daily.    [provider]  rivaroxaban (XARELTO) 20 MG TABS tablet Take 1 tablet (20 mg total) by mouth daily with supper. 09/28/19   Revankar, Aundra Dubin, MD  sertraline (ZOLOFT) 50 MG tablet Take 50 mg by mouth daily. 10/20/19   [provider]  sodium chloride 1 g tablet Take 1 g by mouth daily.    [provider]  Vitamin D, Ergocalciferol, (DRISDOL) 1.25 MG (50000 UNIT) CAPS capsule Take 50,000 Units by mouth every Wednesday.    [provider]    Physical Exam: Vitals:   01/18/20 1601 01/18/20 1708 01/18/20 1800  BP: (!) 183/84 (!) 176/90 (!) 187/81  Pulse: 83 81 72  Resp: 18 20 14   Temp:  98.8 F (37.1 C)   TempSrc:  Oral   SpO2: 100% 98% 97%   Weight:  55 kg   Height:  5\' 4"  (1.626 m)     Constitutional: NAD, calm, comfortable Vitals:   01/18/20 1601 01/18/20 1708 01/18/20 1800  BP: (!) 183/84 (!) 176/90 (!) 187/81  Pulse: 83 81 72  Resp: 18 20 14   Temp:  98.8 F (37.1 C)   TempSrc:  Oral   SpO2: 100% 98% 97%  Weight:  55 kg   Height:  5\' 4"  (1.626 m)    Eyes: PERRL, lids and conjunctivae normal ENMT: Mucous membranes are moist. Posterior pharynx clear of any exudate or lesions.Normal dentition.  Neck: normal, supple, no masses, no thyromegaly Respiratory: clear to auscultation bilaterally, no wheezing, no crackles. Normal respiratory effort. No accessory muscle use.  Cardiovascular: Regular rate and rhythm, no murmurs / rubs / gallops. No extremity edema. 2+ pedal pulses. No carotid bruits.  Abdomen: no tenderness, no masses palpated. No hepatosplenomegaly. Bowel sounds positive.  Musculoskeletal: no clubbing / cyanosis. No joint deformity upper and lower extremities. Good ROM, no contractures. Normal muscle tone.  Skin: no rashes, lesions, ulcers. No induration Neurologic: CN 2-12 grossly intact. Sensation intact, DTR normal. Strength 5/5 in all 4.  Bilateral heel slide very sluggish, proprioceptor sensation decreased on bilateral toes. Psychiatric: Normal judgment and insight. Alert and oriented x 3. Normal mood.     Labs on Admission: I have personally reviewed following labs and imaging studies  CBC: Recent Labs  Lab 01/18/20 1204  WBC 6.5  HGB 12.1  HCT 37.3  MCV 89.9  PLT 167   Basic Metabolic Panel: Recent Labs  Lab 01/18/20 1204  NA 132*  K 3.7  CL 95*  CO2 25  GLUCOSE 123*  BUN 7*  CREATININE 0.54  CALCIUM 9.4   GFR: Estimated Creatinine Clearance: 45.2 mL/min (by C-G formula based on SCr of 0.54 mg/dL). Liver Function Tests: No results for input(s): AST, ALT, ALKPHOS, BILITOT, PROT, ALBUMIN in the last 168 hours. No results for input(s): LIPASE, AMYLASE in the last 168 hours. No  results for input(s): AMMONIA in the last 168 hours. Coagulation Profile: No results for input(s): INR, PROTIME in the last 168 hours. Cardiac Enzymes: No results for input(s): CKTOTAL, CKMB, CKMBINDEX, TROPONINI in the last 168 hours. BNP (last 3 results) No results for input(s): PROBNP in the last 8760 hours.  HbA1C: No results for input(s): HGBA1C in the last 72 hours. CBG: No results for input(s): GLUCAP in the last 168 hours. Lipid Profile: No results for input(s): CHOL, HDL, LDLCALC, TRIG, CHOLHDL, LDLDIRECT in the last 72 hours. Thyroid Function Tests: No results for input(s): TSH, T4TOTAL, FREET4, T3FREE, THYROIDAB in the last 72 hours. Anemia Panel: No results for input(s): VITAMINB12, FOLATE, FERRITIN, TIBC, IRON, RETICCTPCT in the last 72 hours. Urine analysis:    Component Value Date/Time   COLORURINE YELLOW 07/14/2018 1511   APPEARANCEUR CLEAR 07/14/2018 1511   LABSPEC >1.046 (H) 07/14/2018 1511   PHURINE 5.0 07/14/2018 1511   GLUCOSEU NEGATIVE 07/14/2018 1511   HGBUR NEGATIVE 07/14/2018 1511   BILIRUBINUR NEGATIVE 07/14/2018 1511   KETONESUR NEGATIVE 07/14/2018 1511   PROTEINUR NEGATIVE 07/14/2018 1511   NITRITE NEGATIVE 07/14/2018 1511   LEUKOCYTESUR NEGATIVE 07/14/2018 1511    Radiological Exams on Admission: DG Chest 2 View  Result Date: 01/18/2020 CLINICAL DATA:  Chest pain EXAM: CHEST - 2 VIEW COMPARISON:  10/28/2019. FINDINGS: The heart size and mediastinal contours are within normal limits. Aortic atherosclerosis. Coronary artery stent. Both lungs are clear. No visible pleural effusions or pneumothorax. No acute osseous abnormality. Similar eventration of the right hemidiaphragm. Cholecystectomy clips. IMPRESSION: No active cardiopulmonary disease. Electronically Signed   By: Feliberto Harts MD   On: 01/18/2020 13:24    EKG: Independently reviewed.  Chronic RBBB  Assessment/Plan Active Problems:   Chest pain  (please populate well all problems here in  Problem List. (For example, if patient is on BP meds at home and you resume or decide to hold them, it is a problem that needs to be her. Same for CAD, COPD, HLD and so on)  Chest pain -ACS ruled out.  Patient had cardiac cath and stenting in June 2020. -We have ordered echocardiogram to evaluate wall motion abnormalities, if negative, expect patient can follow-up with her cardiologist for annual stress test.  Acute on chronic ambulation dysfunction -Appears that there is coordination dysfunction on her legs, suspect there is underlying neuropathy. -Probably need to follow with outpatient neurology for further work-up -UA pending -B12, RPR -PT evaluation, orthostatic BP x2.  Uncontrolled hypertension with baseline labile BP -Outpatient work-up showed patient has had 2 deliveries with it since last admission in July.  Her cardiology evaluation.  Indicate patient has whitecoat syndrome.  And the Cardizem and Lasix have been held. -BP seems persistently high in ED, I started her on low-dose of Coreg.  PAF -Rate controlled, continue Xarelto -Given that the patient worried on Plavix and Xarelto, consist of ischemic stroke is low, will not order MRI at this time.  IIDM -Continue Metformin  Seizure disorder -Continue Keppra.   DVT prophylaxis: Xarelto Code Status: DNR Family Communication: None at bedside Disposition Plan: Expect less than 2 midnight hospital stay Consults called: None Admission status: Telemetry observation   Emeline General MD [facility] Pager [phone]  01/18/2020, 7:47 PM

## 2020-01-18 NOTE — ED Provider Notes (Signed)
[facility] EMERGENCY DEPARTMENT Provider Note   CSN: [id number] Arrival date & time: 01/18/20  1149     History Chief Complaint  Patient presents with  . Chest Pain    Elaine Mathews is a 86 y.o. female past medical history of paroxysmal A. fib on Xarelto, type 2 diabetes, peripheral vascular disease, CAD, CHF, hypertension, hyperlipidemia stroke, presenting to the emergency department with complaint of chest pain.  She states earlier today, few hours prior to arrival, she was walking when she began having substernal chest pain.  She describes it as a pressure sensation without radiation.  She also felt some diaphoresis and shortness of breath associated.  Her symptoms resolved with rest.  She states her prior MIs, her only symptom was dizziness.  EMS administered 324 of aspirin in route.  Per chart review, patient had left catheterization in June 2020 which showed multivessel disease status post PCI.  Last echocardiogram was in September of this year with EF of 70 to 75% hyperdynamic left ventricular function.  Carotid duplex with 40 to 59% stenosis of the right, mild stenosis of the left.  She is followed by Dr. Tomie China with cardiology, last outpatient visit was on 01/11/2020.  The history is provided by the patient and medical records.    HPI: A 86 year old patient with a history of CVA, peripheral artery disease, treated diabetes, hypertension and hypercholesterolemia presents for evaluation of chest pain. Initial onset of pain was approximately 3-6 hours ago. The patient's chest pain is described as heaviness/pressure/tightness and is worse with exertion. The patient reports some diaphoresis. The patient's chest pain is middle- or left-sided, is not well-localized, is not sharp and does not radiate to the arms/jaw/neck. The patient does not complain of nausea. The patient has not smoked in the past 90 days, has no relevant family history of coronary artery disease (first  degree relative at less than age 22) and does not have an elevated BMI (>=30).   Past Medical History:  Diagnosis Date  . A-fib (HCC)   . Abrasion, knee, left, initial encounter 10/28/2019  . AF (paroxysmal atrial fibrillation) (HCC) 10/28/2019  . Allergic rhinosinusitis   . Arthritis   . Asthma   . Ataxia    With generalized weakness in legs  . CAD (coronary artery disease)   . CAD in native artery 10/28/2019  . Cataract, bilateral   . CHF (congestive heart failure) (HCC)   . Chronic diastolic CHF (congestive heart failure) (HCC) 10/28/2019  . Controlled type 2 diabetes with neuropathy (HCC) 01/06/2017  . Coronary artery disease   . Dementia without behavioral disturbance (HCC) 10/28/2019  . Diabetes mellitus due to underlying condition with unspecified complications (HCC) 11/17/2019  . Diabetes mellitus without complication (HCC)   . Diarrhea 10/28/2019  . DM2 (diabetes mellitus, type 2) (HCC) 10/28/2019  . Dysrhythmia   . Elevated LFTs 07/12/2018  . Elevated liver enzymes   . Essential hypertension 11/13/2017  . Fatty liver   . Foot lesion   . History of stroke 10/28/2019  . Hyperlipidemia   . Hypertension   . Hypothyroidism   . Leg pain, bilateral   . MI (myocardial infarction) (HCC)   . Mild asthma   . Mild vascular neurocognitive disorder (HCC) 11/13/2018  . Nail dystrophy 03/08/2019  . Neuromuscular disorder (HCC)   . NSTEMI (non-ST elevated myocardial infarction) (HCC) 07/12/2018  . Paroxysmal atrial fibrillation (HCC) 11/13/2017  . Peripheral vascular disease (HCC)   . Pre-ulcerative calluses 01/06/2017  .  Prolonged QT interval 10/28/2019  . PVD (peripheral vascular disease) (HCC)   . Seizure (HCC)   . Seizure disorder (HCC) 10/28/2019  . Sinus pause 07/13/2018  . Spleen hematoma without rupture of capsule, without open wound into cavity 07/15/2018  . Stroke [facility])    Right parietal  . Stroke (HCC)   . Syncope and collapse 10/28/2019  . Syncope, vasovagal 07/15/2018  . Thyroid  disease   . Type 2 diabetes mellitus with hyperlipidemia (HCC) 10/28/2019  . Vitamin D deficiency     Patient Active Problem List   Diagnosis Date Noted  . Hyperlipidemia   . Diabetes mellitus due to underlying condition with unspecified complications (HCC) 11/17/2019  . A-fib (HCC)   . Asthma   . Ataxia   . Cataract, bilateral   . CHF (congestive heart failure) (HCC)   . Coronary artery disease   . Diabetes mellitus without complication (HCC)   . Dysrhythmia   . Hypertension   . MI (myocardial infarction) (HCC)   . Neuromuscular disorder (HCC)   . Peripheral vascular disease (HCC)   . Thyroid disease   . Syncope and collapse 10/28/2019  . DM2 (diabetes mellitus, type 2) (HCC) 10/28/2019  . Seizure disorder (HCC) 10/28/2019  . Diarrhea 10/28/2019  . Syncope, vasovagal 10/28/2019  . CAD in native artery 10/28/2019  . Essential hypertension 10/28/2019  . AF (paroxysmal atrial fibrillation) (HCC) 10/28/2019  . Chronic diastolic CHF (congestive heart failure) (HCC) 10/28/2019  . Dementia without behavioral disturbance (HCC) 10/28/2019  . Hypothyroidism 10/28/2019  . Fatty liver 10/28/2019  . Type 2 diabetes mellitus with hyperlipidemia (HCC) 10/28/2019  . History of stroke 10/28/2019  . Prolonged QT interval 10/28/2019  . Abrasion, knee, left, initial encounter 10/28/2019  . Nail dystrophy 03/08/2019  . Mild vascular neurocognitive disorder (HCC) 11/13/2018  . Vitamin D deficiency   . Stroke (HCC)   . PVD (peripheral vascular disease) (HCC)   . Mild asthma   . Leg pain, bilateral   . Hypothyroidism   . Foot lesion   . Fatty liver   . Elevated liver enzymes   . CAD (coronary artery disease)   . Arthritis   . Allergic rhinosinusitis   . Syncope, vasovagal 07/15/2018  . Seizure (HCC) 07/15/2018  . Spleen hematoma without rupture of capsule, without open wound into cavity 07/15/2018  . Sinus pause 07/13/2018  . NSTEMI (non-ST elevated myocardial infarction) (HCC)  07/12/2018  . Elevated LFTs 07/12/2018  . Paroxysmal atrial fibrillation (HCC) 11/13/2017  . Essential hypertension 11/13/2017  . Pre-ulcerative calluses 01/06/2017  . Controlled type 2 diabetes with neuropathy (HCC) 01/06/2017    Past Surgical History:  Procedure Laterality Date  . APPENDECTOMY    . CATARACT EXTRACTION Bilateral   . CHOLECYSTECTOMY    . CORONARY ANGIOPLASTY    . CORONARY STENT INTERVENTION N/A 07/13/2018   Procedure: CORONARY STENT INTERVENTION;  Surgeon: Yvonne Kendall, MD;  Location: [facility] INVASIVE CV LAB;  Service: Cardiovascular;  Laterality: N/A;  . INTRAVASCULAR PRESSURE WIRE/FFR STUDY N/A 07/13/2018   Procedure: INTRAVASCULAR PRESSURE WIRE/FFR STUDY;  Surgeon: Yvonne Kendall, MD;  Location: [facility] INVASIVE CV LAB;  Service: Cardiovascular;  Laterality: N/A;  . LEFT HEART CATH AND CORONARY ANGIOGRAPHY N/A 07/13/2018   Procedure: LEFT HEART CATH AND CORONARY ANGIOGRAPHY;  Surgeon: Yvonne Kendall, MD;  Location: [facility] INVASIVE CV LAB;  Service: Cardiovascular;  Laterality: N/A;  . TONSILLECTOMY       OB History   No obstetric history on file.  Family History  Problem Relation Age of Onset  . Diabetes Mother   . CAD Father   . CAD Other   . Hypertension Mother   . Stroke Mother   . Heart attack Mother   . Hypercholesterolemia Mother   . Hypertension Father   . Stroke Father   . Heart attack Son   . COPD Son     Social History   Tobacco Use  . Smoking status: Never Smoker  . Smokeless tobacco: Never Used  Vaping Use  . Vaping Use: Never used  Substance Use Topics  . Alcohol use: Not Currently  . Drug use: No    Home Medications Prior to Admission medications   Medication Sig Start Date End Date Taking? Authorizing Provider  atorvastatin (LIPITOR) 40 MG tablet Take 1 tablet (40 mg total) by mouth daily at 6 PM. 09/28/19   Revankar, Aundra Dubin, MD  clopidogrel (PLAVIX) 75 MG tablet Take 1 tablet (75 mg total) by mouth daily with breakfast. 09/28/19    Revankar, Aundra Dubin, MD  ezetimibe (ZETIA) 10 MG tablet Take 10 mg by mouth every evening.     [provider]  famotidine (PEPCID) 40 MG tablet Take 40 mg by mouth daily.    [provider]  levETIRAcetam (KEPPRA) 250 MG tablet TAKE 1 TABLET BY MOUTH EVERY MORNING ANDTAKE 2 TABLETS EVERY EVENING 03/01/19   Van Clines, MD  memantine (NAMENDA) 10 MG tablet Take 10 mg by mouth 2 (two) times daily.    [provider]  metFORMIN (GLUCOPHAGE) 500 MG tablet Take 250 mg by mouth 2 (two) times daily.     [provider]  Misc Natural Products (GLUCOSAMINE CHONDROITIN TRIPLE) TABS Take 2 tablets by mouth every evening.    [provider]  montelukast (SINGULAIR) 10 MG tablet Take 10 mg by mouth every evening.  10/20/19   [provider]  nitroGLYCERIN (NITROSTAT) 0.4 MG SL tablet Place 1 tablet (0.4 mg total) under the tongue every 5 (five) minutes as needed for chest pain. 09/28/19   Revankar, Aundra Dubin, MD  polyvinyl alcohol (ARTIFICIAL TEARS) 1.4 % ophthalmic solution Place 1 drop into both eyes as needed for dry eyes.    [provider]  potassium chloride (KLOR-CON) 10 MEQ tablet Take 1 tablet (10 mEq total) by mouth daily. 09/28/19   Revankar, Aundra Dubin, MD  Prenatal Vit-Fe Fumarate-FA (PRENATAL VITAMIN PO) Take 1 tablet by mouth daily.    [provider]  Probiotic Product [facility]) CAPS Take 1 capsule by mouth daily.    [provider]  rivaroxaban (XARELTO) 20 MG TABS tablet Take 1 tablet (20 mg total) by mouth daily with supper. 09/28/19   Revankar, Aundra Dubin, MD  sertraline (ZOLOFT) 50 MG tablet Take 50 mg by mouth daily. 10/20/19   [provider]  sodium chloride 1 g tablet Take 1 g by mouth daily.    [provider]  Vitamin D, Ergocalciferol, (DRISDOL) 1.25 MG (50000 UNIT) CAPS capsule Take 50,000 Units by mouth every Wednesday.    [provider]    Allergies    Codeine, Other, Codeine,  Penicillins, and Penicillins  Review of Systems   Review of Systems  Constitutional: Positive for diaphoresis.  Respiratory: Positive for shortness of breath.   Cardiovascular: Positive for chest pain.  All other systems reviewed and are negative.   Physical Exam Updated Vital Signs BP (!) 187/81   Pulse 72   Temp 98.8 F (37.1 C) (  Oral)   Resp 14   Ht 5\' 4"  (1.626 m)   Wt 55 kg   SpO2 97%   BMI 20.81 kg/m   Physical Exam Vitals and nursing note reviewed.  Constitutional:      General: She is not in acute distress.    Appearance: She is well-developed. She is not ill-appearing.  HENT:     Head: Normocephalic and atraumatic.  Eyes:     Conjunctiva/sclera: Conjunctivae normal.  Cardiovascular:     Rate and Rhythm: Normal rate and regular rhythm.  Pulmonary:     Effort: Pulmonary effort is normal. No respiratory distress.     Breath sounds: Normal breath sounds.  Abdominal:     General: Bowel sounds are normal.     Palpations: Abdomen is soft.     Tenderness: There is no abdominal tenderness.  Musculoskeletal:     Right lower leg: No edema.     Left lower leg: No edema.  Skin:    General: Skin is warm.  Neurological:     Mental Status: She is alert.  Psychiatric:        Behavior: Behavior normal.     ED Results / Procedures / Treatments   Labs (all labs ordered are listed, but only abnormal results are displayed) Labs Reviewed  BASIC METABOLIC PANEL - Abnormal; Notable for the following components:      Result Value   Sodium 132 (*)    Chloride 95 (*)    Glucose, Bld 123 (*)    BUN 7 (*)    All other components within normal limits  RESP PANEL BY RT-PCR (FLU A&B, COVID) ARPGX2  CBC  TROPONIN I (HIGH SENSITIVITY)  TROPONIN I (HIGH SENSITIVITY)    EKG EKG Interpretation  Date/Time:  Tuesday January 18 2020 12:03:10 EST Ventricular Rate:  81 PR Interval:  118 QRS Duration: 110 QT Interval:  454 QTC Calculation: 527 R Axis:   86 Text  Interpretation: Normal sinus rhythm Right bundle branch block Abnormal ECG sinus replacing afib on prior 6/20 Confirmed by 7/20 [phone]) on 01/18/2020 4:53:16 PM   Radiology DG Chest 2 View  Result Date: 01/18/2020 CLINICAL DATA:  Chest pain EXAM: CHEST - 2 VIEW COMPARISON:  10/28/2019. FINDINGS: The heart size and mediastinal contours are within normal limits. Aortic atherosclerosis. Coronary artery stent. Both lungs are clear. No visible pleural effusions or pneumothorax. No acute osseous abnormality. Similar eventration of the right hemidiaphragm. Cholecystectomy clips. IMPRESSION: No active cardiopulmonary disease. Electronically Signed   By: 10/30/2019 MD   On: 01/18/2020 13:24    Procedures Procedures (including critical care time)  Medications Ordered in ED Medications - No data to display  ED Course  I have reviewed the triage vital signs and the nursing notes.  Pertinent labs & imaging results that were available during my care of the patient were reviewed by me and considered in my medical decision making (see chart for details).  Clinical Course as of Jan 17 1845  Tue Jan 18, 2020  [id number] 86 year old female with known coronary disease here with chest pain and feeling weak in her legs.  She says she has been getting these fairly frequently over the past few weeks.  Hypertensive here.  Otherwise no distress.  Chest pain is resolved.  Getting labs including serial troponins.   [MB]    Clinical Course User Index [MB] 97, MD   MDM Rules/Calculators/A&P HEAR Score: 7  Patient with history of multivessel CAD, paroxysmal A. fib on Xarelto, presenting with chest pain began while walking today.  She states she had minimal exertion with the walking, however described pain as a pressure-like sensation with shortness of breath and diaphoresis.  Pain resolved with rest.  She was given 324 of aspirin by EMS.  She is asymptomatic on  evaluation.  She is noted to be hypertensive, not currently on antihypertensives per chart review.  She is also compliant with her anticoagulation.  Initial EKG without ischemic changes, initial troponin is normal.  CBC is within normal limits.  Metabolic panel without acute significant change from baseline.  Patient's hear score of 7, she has multiple risk factors and concerning presentation today for ACS.  Consult placed to unassigned hospitalist for admission for chest pain rule out.  Patient discussed with and evaluated by attending Dr. Charm Barges.  Agrees with work-up and care plan at this time.  Final Clinical Impression(s) / ED Diagnoses Final diagnoses:  Substernal chest pain    Rx / DC Orders ED Discharge Orders    None       , [place] N, PA-C 01/18/20 1847    Terrilee Files, MD 01/19/20 1218

## 2020-01-18 NOTE — ED Triage Notes (Signed)
Pt here via [facility] for eval of central, non radiating cp since 0700 today. 324 ASA given PTA. Pain has resolved on arrival to ED, pt has no complaints.

## 2020-01-18 NOTE — ED Notes (Signed)
Patient resting in bed, has no complaints at this time. Patient wants to eat, on divert at this time d/t PA wanting cards to clear patient first.  Patient aware, verbalized understanding.

## 2020-01-18 NOTE — ED Notes (Signed)
Patient placed on purewick  Patient given bagged lunch

## 2020-01-18 NOTE — ED Notes (Signed)
Patient denies pain and is resting comfortably.  

## 2020-01-19 ENCOUNTER — Encounter (HOSPITAL_COMMUNITY): Payer: Self-pay

## 2020-01-19 ENCOUNTER — Observation Stay (HOSPITAL_COMMUNITY): Payer: Medicare Other

## 2020-01-19 DIAGNOSIS — R072 Precordial pain: Secondary | ICD-10-CM

## 2020-01-19 DIAGNOSIS — R42 Dizziness and giddiness: Secondary | ICD-10-CM | POA: Diagnosis not present

## 2020-01-19 LAB — CBC WITH DIFFERENTIAL/PLATELET
Abs Immature Granulocytes: 0.02 10*3/uL (ref 0.00–0.07)
Basophils Absolute: 0 10*3/uL (ref 0.0–0.1)
Basophils Relative: 0 %
Eosinophils Absolute: 0.2 10*3/uL (ref 0.0–0.5)
Eosinophils Relative: 4 %
HCT: 34.2 % — ABNORMAL LOW (ref 36.0–46.0)
Hemoglobin: 11.8 g/dL — ABNORMAL LOW (ref 12.0–15.0)
Immature Granulocytes: 0 %
Lymphocytes Relative: 19 %
Lymphs Abs: 1.1 10*3/uL (ref 0.7–4.0)
MCH: 30.2 pg (ref 26.0–34.0)
MCHC: 34.5 g/dL (ref 30.0–36.0)
MCV: 87.5 fL (ref 80.0–100.0)
Monocytes Absolute: 0.3 10*3/uL (ref 0.1–1.0)
Monocytes Relative: 5 %
Neutro Abs: 4 10*3/uL (ref 1.7–7.7)
Neutrophils Relative %: 72 %
Platelets: 153 10*3/uL (ref 150–400)
RBC: 3.91 MIL/uL (ref 3.87–5.11)
RDW: 15.1 % (ref 11.5–15.5)
WBC: 5.7 10*3/uL (ref 4.0–10.5)
nRBC: 0 % (ref 0.0–0.2)

## 2020-01-19 LAB — BASIC METABOLIC PANEL
Anion gap: 14 (ref 5–15)
BUN: 8 mg/dL (ref 8–23)
CO2: 23 mmol/L (ref 22–32)
Calcium: 9.2 mg/dL (ref 8.9–10.3)
Chloride: 95 mmol/L — ABNORMAL LOW (ref 98–111)
Creatinine, Ser: 0.55 mg/dL (ref 0.44–1.00)
GFR, Estimated: >60 mL/min (ref >60)
Glucose, Bld: 107 mg/dL — ABNORMAL HIGH (ref 70–99)
Potassium: 3.7 mmol/L (ref 3.5–5.1)
Sodium: 132 mmol/L — ABNORMAL LOW (ref 135–145)

## 2020-01-19 LAB — RPR: RPR Ser Ql: NONREACTIVE

## 2020-01-19 MED ORDER — INFLUENZA VAC A&B SA ADJ QUAD 0.5 ML IM PRSY
0.5000 mL | PREFILLED_SYRINGE | INTRAMUSCULAR | Status: DC
  Filled 2020-01-19: qty 0.5

## 2020-01-19 MED ORDER — ENSURE ENLIVE PO LIQD
237.0000 mL | Freq: Two times a day (BID) | ORAL | Status: DC
  Administered 2020-01-20 (×2): 237 mL via ORAL

## 2020-01-19 NOTE — ED Notes (Signed)
Attempted to call report, RN unavailable.  Will call back. ?

## 2020-01-19 NOTE — ED Notes (Signed)
Pt struggled to sit on the edge of the bed by herself. Pt was assisted by NT to sit on the edge on the bed. Pt kept leaning back while sitting on the edge of the bed. Pt was also assisted to stand and kept falling back. Pt was guided to lay back in the bed.

## 2020-01-19 NOTE — Progress Notes (Signed)
PROGRESS NOTE  Elaine Mathews NKN:[id number] DOB: 1934/01/30 DOA: 01/18/2020 PCP: Lucianne Lei, MD  HPI/Recap of past 24 hours: HPI from Dr Frederik Schmidt is a 86 y.o. female with medical history significant of CAD with LAD stenting x1 last year, make ambulation dysfunction, PAF on Xarelto, labile BP of BP medications, mild dementia, seizure disorder, IIDM, presented with multiple complaints.  C/O chest tightness 7-8 over 10, localized retrosternal, whole episode lasted about 15 minutes and resolved by its own.  Denied any shortness of breath, no nauseous vomiting or diaphoresis.  Meantime, she also experienced wobbly gait, some lightheadedness and temporary confusion which all resolved. Patient has a chronic ambulation dysfunction, started use rolling walker since last year. It appears that the patient has had labile blood pressure, has been following with her cardiologist who discontinued Cardizem and diuresis on last admission. ED Course: Troponin negative x2, blood pressure significantly elevated.  EKG is reassuring.  Patient admitted for further management.    Today, patient reports resolved chest tightness/pain, denies any shortness of breath, denies any abdominal pain, nausea/vomiting, fever/chills.  AAOx3    Assessment/Plan: Active Problems:   Substernal chest pain  Chest pain History of CAD Resolved prior to coming to the ED Trop x2 -, EKG with no acute ST changes Cardiac cath and stenting in June 2020 Echo done 9/21 showed EF of 70 to 75%, no regional wall motion abnormalities, grade 1 diastolic dysfunction Continue Coreg, Lipitor, Plavix,  Uncontrolled hypertension/labile BP Fluctuating Continue Coreg  Paroxysmal A. Fib Rate controlled, continue Xarelto, Coreg  Hyponatremia Daily BMP Continue salt tablets  Diabetes mellitus type 2 Metformin was continued by admitting physician, will continue for now  Seizure disorder Continue Keppra  Acute on chronic  ambulation dysfunction CT head unremarkable UA showed Large leukocytes, rare bacteria, greater than 50 WBC, UC pending B12 WNL, RPR pending PT/OT eval        Malnutrition Type:      Malnutrition Characteristics:      Nutrition Interventions:       Estimated body mass index is 20.81 kg/m as calculated from the following:   Height as of this encounter: 5\' 4"  (1.626 m).   Weight as of this encounter: 55 kg.     Code Status: DNR  Family Communication: None at bedside  Disposition Plan: Status is: Observation  The patient remains OBS appropriate and will d/c before 2 midnights.  Dispo: The patient is from: Home              Anticipated d/c is to: Home              Anticipated d/c date is: 1 day              Patient currently is not medically stable to d/c.    Consultants:  None  Procedures:  None  Antimicrobials:  None  DVT prophylaxis: Xarelto   Objective: Vitals:   01/19/20 1400 01/19/20 1500 01/19/20 1637 01/19/20 1649  BP: (!) 120/55 (!) 120/55  (!) 151/70  Pulse: 67 72  64  Resp: 19 19  18   Temp:   98.5 F (36.9 C)   TempSrc:   Oral   SpO2: 95% 94%  95%  Weight:      Height:       No intake or output data in the 24 hours ending 01/19/20 1747 Filed Weights   01/18/20 1708  Weight: 55 kg    Exam:  General: NAD   Cardiovascular:  S1, S2 present  Respiratory: CTAB  Abdomen: Soft, nontender, nondistended, bowel sounds present  Musculoskeletal: No bilateral pedal edema noted  Skin: Normal  Psychiatry: Normal mood   Data Reviewed: CBC: Recent Labs  Lab 01/18/20 1204 01/19/20 0941  WBC 6.5 5.7  NEUTROABS  --  4.0  HGB 12.1 11.8*  HCT 37.3 34.2*  MCV 89.9 87.5  PLT 167 153   Basic Metabolic Panel: Recent Labs  Lab 01/18/20 1204 01/19/20 0941  NA 132* 132*  K 3.7 3.7  CL 95* 95*  CO2 25 23  GLUCOSE 123* 107*  BUN 7* 8  CREATININE 0.54 0.55  CALCIUM 9.4 9.2   GFR: Estimated Creatinine Clearance: 45.2  mL/min (by C-G formula based on SCr of 0.55 mg/dL). Liver Function Tests: No results for input(s): AST, ALT, ALKPHOS, BILITOT, PROT, ALBUMIN in the last 168 hours. No results for input(s): LIPASE, AMYLASE in the last 168 hours. No results for input(s): AMMONIA in the last 168 hours. Coagulation Profile: No results for input(s): INR, PROTIME in the last 168 hours. Cardiac Enzymes: No results for input(s): CKTOTAL, CKMB, CKMBINDEX, TROPONINI in the last 168 hours. BNP (last 3 results) No results for input(s): PROBNP in the last 8760 hours. HbA1C: No results for input(s): HGBA1C in the last 72 hours. CBG: No results for input(s): GLUCAP in the last 168 hours. Lipid Profile: No results for input(s): CHOL, HDL, LDLCALC, TRIG, CHOLHDL, LDLDIRECT in the last 72 hours. Thyroid Function Tests: No results for input(s): TSH, T4TOTAL, FREET4, T3FREE, THYROIDAB in the last 72 hours. Anemia Panel: Recent Labs    01/18/20 2149  VITAMINB12 320   Urine analysis:    Component Value Date/Time   COLORURINE AMBER (A) 01/18/2020 2121   APPEARANCEUR CLOUDY (A) 01/18/2020 2121   LABSPEC 1.018 01/18/2020 2121   PHURINE 5.0 01/18/2020 2121   GLUCOSEU NEGATIVE 01/18/2020 2121   HGBUR NEGATIVE 01/18/2020 2121   BILIRUBINUR NEGATIVE 01/18/2020 2121   KETONESUR NEGATIVE 01/18/2020 2121   PROTEINUR 30 (A) 01/18/2020 2121   NITRITE NEGATIVE 01/18/2020 2121   LEUKOCYTESUR LARGE (A) 01/18/2020 2121   Sepsis Labs: @LABRCNTIP (procalcitonin:4,lacticidven:4)  ) Recent Results (from the past 240 hour(s))  Resp Panel by RT-PCR (Flu A&B, Covid) Nasopharyngeal Swab     Status: None   Collection Time: 01/18/20  5:56 PM   Specimen: Nasopharyngeal Swab; Nasopharyngeal(NP) swabs in vial transport medium  Result Value Ref Range Status   SARS Coronavirus 2 by RT PCR NEGATIVE NEGATIVE Final    Comment: (NOTE) SARS-CoV-2 target nucleic acids are NOT DETECTED.  The SARS-CoV-2 RNA is generally detectable in upper  respiratory specimens during the acute phase of infection. The lowest concentration of SARS-CoV-2 viral copies this assay can detect is 138 copies/mL. A negative result does not preclude SARS-Cov-2 infection and should not be used as the sole basis for treatment or other patient management decisions. A negative result may occur with  improper specimen collection/handling, submission of specimen other than nasopharyngeal swab, presence of viral mutation(s) within the areas targeted by this assay, and inadequate number of viral copies(<138 copies/mL). A negative result must be combined with clinical observations, patient history, and epidemiological information. The expected result is Negative.  Fact Sheet for Patients:  14/07/21  Fact Sheet for Healthcare Providers:  BloggerCourse.com  This test is no t yet approved or cleared by the [employer] FDA and  has been authorized for detection and/or diagnosis of SARS-CoV-2 by FDA under an Emergency Use Authorization (EUA). This EUA will remain  in effect (meaning this test can be used) for the duration of the COVID-19 declaration under Section 564(b)(1) of the Act, 21 U.S.C.section 360bbb-3(b)(1), unless the authorization is terminated  or revoked sooner.       Influenza A by PCR NEGATIVE NEGATIVE Final   Influenza B by PCR NEGATIVE NEGATIVE Final    Comment: (NOTE) The Xpert Xpress SARS-CoV-2/FLU/RSV plus assay is intended as an aid in the diagnosis of influenza from Nasopharyngeal swab specimens and should not be used as a sole basis for treatment. Nasal washings and aspirates are unacceptable for Xpert Xpress SARS-CoV-2/FLU/RSV testing.  Fact Sheet for Patients: BloggerCourse.com  Fact Sheet for Healthcare Providers: SeriousBroker.it  This test is not yet approved or cleared by the [place] FDA and has been  authorized for detection and/or diagnosis of SARS-CoV-2 by FDA under an Emergency Use Authorization (EUA). This EUA will remain in effect (meaning this test can be used) for the duration of the COVID-19 declaration under Section 564(b)(1) of the Act, 21 U.S.C. section 360bbb-3(b)(1), unless the authorization is terminated or revoked.  Performed at [facility], [address], [place], [place] [address]       Studies: CT HEAD WO CONTRAST  Result Date: 01/19/2020 CLINICAL DATA:  Nonspecific dizziness EXAM: CT HEAD WITHOUT CONTRAST TECHNIQUE: Contiguous axial images were obtained from the base of the skull through the vertex without intravenous contrast. COMPARISON:  07/14/2018 FINDINGS: Brain: No evidence of acute infarction, hemorrhage, hydrocephalus, extra-axial collection or mass lesion/mass effect. Remote right parietal cortex based infarct which is moderate size. Confluent chronic small vessel ischemia in the cerebral white matter. Cerebral volume loss which is generalized. Vascular: No hyperdense vessel or unexpected calcification. Skull: Normal. Negative for fracture or focal lesion. Sinuses/Orbits: Chronic completely opacified left frontal sinus. Bilateral cataract resection. IMPRESSION: 1. No acute or reversible finding. 2. Chronic small vessel ischemia and remote right parietal infarct. Electronically Signed   By: Marnee Spring M.D.   On: 01/19/2020 10:37    Scheduled Meds: . acidophilus  1 capsule Oral Daily  . atorvastatin  40 mg Oral q1800  . carvedilol  3.125 mg Oral BID WC  . clopidogrel  75 mg Oral Q breakfast  . ezetimibe  10 mg Oral QPM  . famotidine  40 mg Oral Daily  . levETIRAcetam  250 mg Oral BID  . memantine  10 mg Oral BID  . metFORMIN  250 mg Oral BID WC  . montelukast  10 mg Oral QPM  . multivitamin with minerals  1 tablet Oral Daily  . potassium chloride  10 mEq Oral Daily  . rivaroxaban  20 mg Oral Q supper  . sertraline  50 mg Oral Daily  .  sodium chloride  1 g Oral Daily  . Vitamin D (Ergocalciferol)  50,000 Units Oral Q Wed    Continuous Infusions:   LOS: 0 days     Briant Cedar, MD [facility]  If 7PM-7AM, please contact night-coverage [email or web address] 01/19/2020, 5:47 PM

## 2020-01-19 NOTE — Plan of Care (Signed)
  Problem: Nutrition Goal: Nutritional status is improving Description: Monitor and assess patient for malnutrition (ex- brittle hair, bruises, dry skin, pale skin and conjunctiva, muscle wasting, smooth red tongue, and disorientation). Collaborate with interdisciplinary team and initiate plan and interventions as ordered.  Monitor patient's weight and dietary intake as ordered or per policy. Utilize nutrition screening tool and intervene per policy. Determine patient's food preferences and provide high-protein, high-caloric foods as appropriate.  Outcome: Progressing   

## 2020-01-19 NOTE — Progress Notes (Signed)
OT Cancellation Note  Patient Details Name: Elaine Mathews MRN: [id number] DOB: 09-Aug-1933   Cancelled Treatment:    Reason Eval/Treat Not Completed: Medical issues which prohibited therapy. Last BP 190/140. Asked nurse to check BP and notify OT/PT . Once within parameters will assess pt. Thanks  Riane Rung,HILLARY 01/19/2020, 10:26 AM  Luisa Dago, OT/L   Acute OT Clinical Specialist Acute Rehabilitation Services Pager [phone] Office [phone]

## 2020-01-19 NOTE — Progress Notes (Signed)
PT Cancellation Note  Patient Details Name: Elaine Mathews MRN: [id number] DOB: 1933-06-22   Cancelled Treatment:    Reason Eval/Treat Not Completed: Medical issues which prohibited therapy Pt with elevated BP (190/140 mmHg). Will hold until pt medically appropriate and follow up as schedule allows.   Cindee Salt, DPT  Acute Rehabilitation Services  Pager: [phone] Office: [phone]    Lehman Prom 01/19/2020, 10:27 AM

## 2020-01-19 NOTE — ED Notes (Signed)
Sit patient up to eat patient is resting with family at bedside and call bell in reach

## 2020-01-19 NOTE — ED Notes (Signed)
Help get patient moved over to another bed placed a brief on patient she is resting with call bell in reach

## 2020-01-19 NOTE — ED Notes (Signed)
Breakfast ordered 

## 2020-01-20 DIAGNOSIS — N390 Urinary tract infection, site not specified: Secondary | ICD-10-CM | POA: Diagnosis not present

## 2020-01-20 DIAGNOSIS — R072 Precordial pain: Secondary | ICD-10-CM | POA: Diagnosis not present

## 2020-01-20 LAB — BASIC METABOLIC PANEL
Anion gap: 11 (ref 5–15)
BUN: 17 mg/dL (ref 8–23)
CO2: 25 mmol/L (ref 22–32)
Calcium: 9.1 mg/dL (ref 8.9–10.3)
Chloride: 97 mmol/L — ABNORMAL LOW (ref 98–111)
Creatinine, Ser: 0.91 mg/dL (ref 0.44–1.00)
GFR, Estimated: >60 mL/min (ref >60)
Glucose, Bld: 121 mg/dL — ABNORMAL HIGH (ref 70–99)
Potassium: 4.4 mmol/L (ref 3.5–5.1)
Sodium: 133 mmol/L — ABNORMAL LOW (ref 135–145)

## 2020-01-20 LAB — CBC WITH DIFFERENTIAL/PLATELET
Abs Immature Granulocytes: 0.02 10*3/uL (ref 0.00–0.07)
Basophils Absolute: 0 10*3/uL (ref 0.0–0.1)
Basophils Relative: 0 %
Eosinophils Absolute: 0.3 10*3/uL (ref 0.0–0.5)
Eosinophils Relative: 5 %
HCT: 31.1 % — ABNORMAL LOW (ref 36.0–46.0)
Hemoglobin: 10.6 g/dL — ABNORMAL LOW (ref 12.0–15.0)
Immature Granulocytes: 0 %
Lymphocytes Relative: 23 %
Lymphs Abs: 1.4 10*3/uL (ref 0.7–4.0)
MCH: 30.1 pg (ref 26.0–34.0)
MCHC: 34.1 g/dL (ref 30.0–36.0)
MCV: 88.4 fL (ref 80.0–100.0)
Monocytes Absolute: 0.4 10*3/uL (ref 0.1–1.0)
Monocytes Relative: 6 %
Neutro Abs: 4.1 10*3/uL (ref 1.7–7.7)
Neutrophils Relative %: 66 %
Platelets: 137 10*3/uL — ABNORMAL LOW (ref 150–400)
RBC: 3.52 MIL/uL — ABNORMAL LOW (ref 3.87–5.11)
RDW: 15.1 % (ref 11.5–15.5)
WBC: 6.1 10*3/uL (ref 4.0–10.5)
nRBC: 0 % (ref 0.0–0.2)

## 2020-01-20 LAB — GLUCOSE, CAPILLARY: Glucose-Capillary: 132 mg/dL — ABNORMAL HIGH (ref 70–99)

## 2020-01-20 LAB — TROPONIN I (HIGH SENSITIVITY): Troponin I (High Sensitivity): 7 ng/L (ref <18)

## 2020-01-20 MED ORDER — INSULIN ASPART 100 UNIT/ML ~~LOC~~ SOLN: 0.0000 [IU] | Freq: Three times a day (TID) | SUBCUTANEOUS | Status: DC

## 2020-01-20 MED ORDER — INSULIN ASPART 100 UNIT/ML ~~LOC~~ SOLN: 0.0000 [IU] | Freq: Every day | SUBCUTANEOUS | Status: DC

## 2020-01-20 MED ORDER — LEVOTHYROXINE SODIUM 75 MCG PO TABS
75.0000 ug | ORAL_TABLET | Freq: Every day | ORAL | Status: DC
  Administered 2020-01-20 – 2020-01-21 (×2): 75 ug via ORAL
  Filled 2020-01-20 (×2): qty 1

## 2020-01-20 MED ORDER — SODIUM CHLORIDE 0.9 % IV SOLN
1.0000 g | INTRAVENOUS | Status: AC
  Administered 2020-01-20 – 2020-01-21 (×2): 1 g via INTRAVENOUS
  Filled 2020-01-20 (×2): qty 10

## 2020-01-20 NOTE — Discharge Instructions (Signed)

## 2020-01-20 NOTE — Progress Notes (Signed)
Nutrition Brief Note  Patient identified on the Malnutrition Screening Tool (MST) Report  Wt Readings from Last 15 Encounters:  01/20/20 54.4 kg  01/11/20 55.4 kg  11/17/19 53.2 kg  10/30/19 55.7 kg  06/28/19 58.5 kg  12/16/18 57.5 kg  10/26/18 55.3 kg  10/21/18 54.4 kg  07/18/18 57.3 kg  05/18/18 57.2 kg  11/25/17 56.7 kg  11/17/17 57.2 kg   Elaine Mathews is a 86 y.o. female with medical history significant of CAD with LAD stenting x1 last year, make ambulation dysfunction, PAF on Xarelto, labile BP of BP medications, mild dementia, seizure disorder, IIDM, presented with multiple complaints.  This morning after breakfast while sitting beside table, patient started to feel tightness in her chest, 7-8 over 10, localized retrosternal, whole episode lasted about 15 minutes and resolved by its own.  Denied any shortness of breath, no nauseous vomiting were diaphoresis.  Meantime, she also experienced wobbly gait, some lightheadedness and temporary confusion which all resolved.  Pt admitted with chest pain.   Spoke with pt at bedside, who was pleasant and in good spirits today. She shares that she consumed 100% of pancakes and sausage for breakfast. Per her report, she typically consumes 3 meals per day. She consumes a wide variety of foods and reports she has a caregiver who assists with meal preparations.   Pt denies any weight loss, however, unsure of UBW. Wt has been stable over the past 3 months.   Nutrition-Focused physical exam completed. Findings are no fat depletion, mild muscle depletion, and no edema.   Medications reviewed and include vitamin D.   Lab Results  Component Value Date   HGBA1C 6.7 (H) 11/17/2019   PTA DM medications are 250 mg metformin BID.   Labs reviewed: Na: 133 (inpatient orders for glycemic control are 250 mg metformin BID).   Current diet order is heart healthy/ carb modified, patient is consuming approximately 70-85% of meals at this time. Labs and  medications reviewed.   No nutrition interventions warranted at this time. If nutrition issues arise, please consult RD.   Levada Schilling, RD, LDN, CDCES Registered Dietitian II Certified Diabetes Care and Education Specialist Please refer to [facility] for RD and/or RD on-call/weekend/after hours pager

## 2020-01-20 NOTE — Progress Notes (Signed)
Elaine Mathews QIH:[id number] DOB: [birth year] DOA: 01/18/2020 PCP: Lucianne Lei, MD  HPI/Recap of past 24 hours: HPI from Dr Frederik Schmidt is a 86 y.o. female with medical history significant of CAD with LAD stenting x1 last year, make ambulation dysfunction, PAF on Xarelto, labile BP of BP medications, mild dementia, seizure disorder, IIDM, presented with multiple complaints.  C/O chest tightness 7-8 over 10, localized retrosternal, whole episode lasted about 15 minutes and resolved by its own.  Denied any shortness of breath, no nauseous vomiting or diaphoresis.  Meantime, she also experienced wobbly gait, some lightheadedness and temporary confusion which all resolved. Patient has a chronic ambulation dysfunction, started use rolling walker since last year. It appears that the patient has had labile blood pressure, has been following with her cardiologist who discontinued Cardizem and diuresis on last admission. ED Course: Troponin negative x2, blood pressure significantly elevated.  EKG is reassuring.  Patient admitted for further management.    Today, patient still with generalized weakness, denies any further chest pain, denies any abdominal pain, nausea/vomiting, fever/chills.    Assessment/Plan: Active Problems:   Substernal chest pain  Chest pain History of CAD Resolved prior to coming to the ED Trop x2 -, EKG with no acute ST changes Cardiac cath and stenting in June 2020 Echo done 9/21 showed EF of 70 to 75%, no regional wall motion abnormalities, grade 1 diastolic dysfunction Continue Coreg, Lipitor, Plavix,  Uncontrolled hypertension/labile BP Fluctuating Continue Coreg  Paroxysmal A. Fib Rate controlled, continue Xarelto, Coreg  UTI UA showed large leukocytes, greater than 50 WBC, rare bacteria UC >100,000 gram-negative rods Start IV ceftriaxone  Hyponatremia Daily BMP Continue salt tablets  Diabetes mellitus type 2 SSI, Accu-Cheks,  hypoglycemic protocol  Seizure disorder Continue Keppra  Acute on chronic ambulation dysfunction CT head unremarkable B12 WNL, RPR nonreactive PT/OT eval        Malnutrition Type:      Malnutrition Characteristics:      Nutrition Interventions:       Estimated body mass index is 20.59 kg/m as calculated from the following:   Height as of this encounter: 5\' 4"  (1.626 m).   Weight as of this encounter: 54.4 kg.     Code Status: DNR  Family Communication: None at bedside  Disposition Plan: Status is: Observation  The patient will require care spanning > 2 midnights and should be moved to inpatient because: Inpatient level of care appropriate due to severity of illness  Dispo: The patient is from: Home              Anticipated d/c is to: Home              Anticipated d/c date is: 1 day              Patient currently is not medically stable to d/c.    Consultants:  None  Procedures:  None  Antimicrobials:  None  DVT prophylaxis: Xarelto   Objective: Vitals:   01/20/20 0400 01/20/20 0900 01/20/20 1130 01/20/20 1200  BP: 139/63 (!) 140/104  (!) 117/59  Pulse: 65 76  72  Resp: 15 16  17   Temp: 98.5 F (36.9 C)  98.8 F (37.1 C)   TempSrc: Oral  Oral   SpO2: 95% 94%  95%  Weight: 54.4 kg     Height:        Intake/Output Summary (Last 24 hours) at 01/20/2020 1628 Last data filed at 01/20/2020 1318  Gross per 24 hour  Intake 360 ml  Output --  Net 360 ml   Filed Weights   01/18/20 1708 01/20/20 0400  Weight: 55 kg 54.4 kg    Exam:  General: NAD   Cardiovascular: S1, S2 present  Respiratory: CTAB  Abdomen: Soft, nontender, nondistended, bowel sounds present  Musculoskeletal: No bilateral pedal edema noted  Skin: Normal  Psychiatry: Normal mood   Data Reviewed: CBC: Recent Labs  Lab 01/18/20 1204 01/19/20 0941 01/20/20 0342  WBC 6.5 5.7 6.1  NEUTROABS  --  4.0 4.1  HGB 12.1 11.8* 10.6*  HCT 37.3 34.2* 31.1*  MCV  89.9 87.5 88.4  PLT 167 153 137*   Basic Metabolic Panel: Recent Labs  Lab 01/18/20 1204 01/19/20 0941 01/20/20 0342  NA 132* 132* 133*  K 3.7 3.7 4.4  CL 95* 95* 97*  CO2 25 23 25   GLUCOSE 123* 107* 121*  BUN 7* 8 17  CREATININE 0.54 0.55 0.91  CALCIUM 9.4 9.2 9.1   GFR: Estimated Creatinine Clearance: 39.5 mL/min (by C-G formula based on SCr of 0.91 mg/dL). Liver Function Tests: No results for input(s): AST, ALT, ALKPHOS, BILITOT, PROT, ALBUMIN in the last 168 hours. No results for input(s): LIPASE, AMYLASE in the last 168 hours. No results for input(s): AMMONIA in the last 168 hours. Coagulation Profile: No results for input(s): INR, PROTIME in the last 168 hours. Cardiac Enzymes: No results for input(s): CKTOTAL, CKMB, CKMBINDEX, TROPONINI in the last 168 hours. BNP (last 3 results) No results for input(s): PROBNP in the last 8760 hours. HbA1C: No results for input(s): HGBA1C in the last 72 hours. CBG: No results for input(s): GLUCAP in the last 168 hours. Lipid Profile: No results for input(s): CHOL, HDL, LDLCALC, TRIG, CHOLHDL, LDLDIRECT in the last 72 hours. Thyroid Function Tests: No results for input(s): TSH, T4TOTAL, FREET4, T3FREE, THYROIDAB in the last 72 hours. Anemia Panel: Recent Labs    01/18/20 2149  VITAMINB12 320   Urine analysis:    Component Value Date/Time   COLORURINE AMBER (A) 01/18/2020 2121   APPEARANCEUR CLOUDY (A) 01/18/2020 2121   LABSPEC 1.018 01/18/2020 2121   PHURINE 5.0 01/18/2020 2121   GLUCOSEU NEGATIVE 01/18/2020 2121   HGBUR NEGATIVE 01/18/2020 2121   BILIRUBINUR NEGATIVE 01/18/2020 2121   KETONESUR NEGATIVE 01/18/2020 2121   PROTEINUR 30 (A) 01/18/2020 2121   NITRITE NEGATIVE 01/18/2020 2121   LEUKOCYTESUR LARGE (A) 01/18/2020 2121   Sepsis Labs: @LABRCNTIP (procalcitonin:4,lacticidven:4)  ) Recent Results (from the past 240 hour(s))  Resp Panel by RT-PCR (Flu A&B, Covid) Nasopharyngeal Swab     Status: None    Collection Time: 01/18/20  5:56 PM   Specimen: Nasopharyngeal Swab; Nasopharyngeal(NP) swabs in vial transport medium  Result Value Ref Range Status   SARS Coronavirus 2 by RT PCR NEGATIVE NEGATIVE Final    Comment: (NOTE) SARS-CoV-2 target nucleic acids are NOT DETECTED.  The SARS-CoV-2 RNA is generally detectable in upper respiratory specimens during the acute phase of infection. The lowest concentration of SARS-CoV-2 viral copies this assay can detect is 138 copies/mL. A negative result does not preclude SARS-Cov-2 infection and should not be used as the sole basis for treatment or other patient management decisions. A negative result may occur with  improper specimen collection/handling, submission of specimen other than nasopharyngeal swab, presence of viral mutation(s) within the areas targeted by this assay, and inadequate number of viral copies(<138 copies/mL). A negative result must be combined with clinical observations, patient history, and epidemiological  information. The expected result is Negative.  Fact Sheet for Patients:  BloggerCourse.com  Fact Sheet for Healthcare Providers:  SeriousBroker.it  This test is no t yet approved or cleared by the [place] FDA and  has been authorized for detection and/or diagnosis of SARS-CoV-2 by FDA under an Emergency Use Authorization (EUA). This EUA will remain  in effect (meaning this test can be used) for the duration of the COVID-19 declaration under Section 564(b)(1) of the Act, 21 U.S.C.section 360bbb-3(b)(1), unless the authorization is terminated  or revoked sooner.       Influenza A by PCR NEGATIVE NEGATIVE Final   Influenza B by PCR NEGATIVE NEGATIVE Final    Comment: (NOTE) The Xpert Xpress SARS-CoV-2/FLU/RSV plus assay is intended as an aid in the diagnosis of influenza from Nasopharyngeal swab specimens and should not be used as a sole basis for treatment.  Nasal washings and aspirates are unacceptable for Xpert Xpress SARS-CoV-2/FLU/RSV testing.  Fact Sheet for Patients: BloggerCourse.com  Fact Sheet for Healthcare Providers: SeriousBroker.it  This test is not yet approved or cleared by the [place] FDA and has been authorized for detection and/or diagnosis of SARS-CoV-2 by FDA under an Emergency Use Authorization (EUA). This EUA will remain in effect (meaning this test can be used) for the duration of the COVID-19 declaration under Section 564(b)(1) of the Act, 21 U.S.C. section 360bbb-3(b)(1), unless the authorization is terminated or revoked.  Performed at [facility], [address], [place], [place] [address]   Culture, Urine     Status: Abnormal (Preliminary result)   Collection Time: 01/19/20 10:55 AM   Specimen: Urine, Random  Result Value Ref Range Status   Specimen Description URINE, RANDOM  Final   Special Requests NONE  Final   Culture (A)  Final    >=100,000 COLONIES/mL GRAM NEGATIVE RODS IDENTIFICATION AND SUSCEPTIBILITIES TO FOLLOW Performed at [facility], [address] [place], [place] [address]    Report Status PENDING  Incomplete      Studies: No results found.  Scheduled Meds: . acidophilus  1 capsule Oral Daily  . atorvastatin  40 mg Oral q1800  . carvedilol  3.125 mg Oral BID WC  . clopidogrel  75 mg Oral Q breakfast  . ezetimibe  10 mg Oral QPM  . famotidine  40 mg Oral Daily  . feeding supplement  237 mL Oral BID BM  . influenza vaccine adjuvanted  0.5 mL Intramuscular Tomorrow-1000  . levETIRAcetam  250 mg Oral BID  . levothyroxine  75 mcg Oral Q0600  . memantine  10 mg Oral BID  . metFORMIN  250 mg Oral BID WC  . montelukast  10 mg Oral QPM  . multivitamin with minerals  1 tablet Oral Daily  . potassium chloride  10 mEq Oral Daily  . rivaroxaban  20 mg Oral Q supper  . sertraline  50 mg Oral Daily  . sodium chloride   1 g Oral Daily  . Vitamin D (Ergocalciferol)  50,000 Units Oral Q Wed    Continuous Infusions: . cefTRIAXone (ROCEPHIN)  IV 1 g (01/20/20 1054)     LOS: 0 days     Briant Cedar, MD [facility]  If 7PM-7AM, please contact night-coverage [email or web address] 01/20/2020, 4:28 PM

## 2020-01-20 NOTE — Evaluation (Signed)
Occupational Therapy Evaluation Patient Details Name: Elaine Mathews MRN: [id number] DOB: 26-Dec-1933 Today's Date: 01/20/2020    History of Present Illness Pt is an 86 year old woman admitted 01/19/20 with chest pain and LE weakness. PMH: CAD with LAD stenting, PAF, labile BP, mild dementia, seizure disorder, DM2, HTN, CVA.   Clinical Impression   Pt ambulates with a rollator and is assisted for IADL. She has supervision for showering and can dress herself. Pt has a supportive son who visits daily. Pt presents with impaired balance and generalized weakness. She requires set up to min assist for ADL. Recommending HHOT upon discharge. Will follow acutely.    Follow Up Recommendations  Home health OT;Supervision/Assistance - 24 hour    Equipment Recommendations  None recommended by OT    Recommendations for Other Services       Precautions / Restrictions Precautions Precautions: Fall      Mobility Bed Mobility Overal bed mobility: Needs Assistance Bed Mobility: Supine to Sit;Sit to Supine     Supine to sit: Supervision Sit to supine: Supervision   General bed mobility comments: increased time    Transfers Overall transfer level: Needs assistance Equipment used: Rolling walker (2 wheeled) Transfers: Sit to/from Stand Sit to Stand: Min assist         General transfer comment: min assist to rise and steady, posterior lean initially    Balance Overall balance assessment: Needs assistance   Sitting balance-Leahy Scale: Good Sitting balance - Comments: no LOB with donning socks     Standing balance-Leahy Scale: Poor Standing balance comment: reliant on UE support                           ADL either performed or assessed with clinical judgement   ADL Overall ADL's : Needs assistance/impaired Eating/Feeding: Independent   Grooming: Supervision/safety;Sitting   Upper Body Bathing: Minimal assistance;Sitting   Lower Body Bathing: Minimal  assistance;Sit to/from stand   Upper Body Dressing : Supervision/safety;Sitting   Lower Body Dressing: Minimal assistance;Sit to/from stand Lower Body Dressing Details (indicate cue type and reason): assist for standing balance Toilet Transfer: Minimal assistance;RW   Toileting- Clothing Manipulation and Hygiene: Minimal assistance;Sit to/from stand       Functional mobility during ADLs: Minimal assistance;Rolling walker       Vision Baseline Vision/History: No visual deficits       Perception     Praxis      Pertinent Vitals/Pain Pain Assessment: No/denies pain     Hand Dominance Right   Extremity/Trunk Assessment Upper Extremity Assessment Upper Extremity Assessment: Overall WFL for tasks assessed   Lower Extremity Assessment Lower Extremity Assessment: Defer to PT evaluation       Communication Communication Communication: No difficulties   Cognition Arousal/Alertness: Awake/alert Behavior During Therapy: WFL for tasks assessed/performed Overall Cognitive Status: History of cognitive impairments - at baseline                                     General Comments       Exercises     Shoulder Instructions      Home Living Family/patient expects to be discharged to:: Private residence Living Arrangements: Alone Available Help at Discharge: Family;Available PRN/intermittently;Personal care attendant (aide 8-4 weekday, 8-12 weekends) Type of Home: Apartment Home Access: Stairs to enter Entrance Stairs-Number of Steps: 1+1 Entrance Stairs-Rails: Right Home Layout:  One level     Bathroom Shower/Tub: Chief Strategy Officer: Standard     Home Equipment: Environmental consultant - 4 wheels;Bedside commode;Shower seat;Grab bars - tub/shower;Hand held shower head          Prior Functioning/Environment Level of Independence: Needs assistance  Gait / Transfers Assistance Needed: walks with rollator ADL's / Homemaking Assistance Needed:  supervised for showering, assisted for IADL   Comments: son is changing aide agencies in January        OT Problem List: Decreased strength;Decreased activity tolerance;Impaired balance (sitting and/or standing);Decreased cognition      OT Treatment/Interventions: Self-care/ADL training;DME and/or AE instruction;Patient/family education;Balance training;Therapeutic activities    OT Goals(Current goals can be found in the care plan section) Acute Rehab OT Goals Patient Stated Goal: return home OT Goal Formulation: With patient Time For Goal Achievement: 02/03/20 Potential to Achieve Goals: Good ADL Goals Pt Will Perform Grooming: with supervision;standing Pt Will Perform Lower Body Bathing: with supervision;sit to/from stand Pt Will Perform Lower Body Dressing: with supervision;sit to/from stand Pt Will Transfer to Toilet: with supervision;ambulating Pt Will Perform Toileting - Clothing Manipulation and hygiene: with supervision;sit to/from stand  OT Frequency: Min 2X/week   Barriers to D/C:            Co-evaluation              AM-PAC OT "6 Clicks" Daily Activity     Outcome Measure Help from another person eating meals?: None Help from another person taking care of personal grooming?: A Little Help from another person toileting, which includes using toliet, bedpan, or urinal?: A Little Help from another person bathing (including washing, rinsing, drying)?: A Little Help from another person to put on and taking off regular upper body clothing?: A Little Help from another person to put on and taking off regular lower body clothing?: A Little 6 Click Score: 19   End of Session Equipment Utilized During Treatment: Gait belt;Rolling walker  Activity Tolerance: Patient tolerated treatment well Patient left: in bed;with call bell/phone within reach;with bed alarm set;with family/visitor present  OT Visit Diagnosis: Unsteadiness on feet (R26.81);Other abnormalities of  gait and mobility (R26.89);Other symptoms and signs involving cognitive function;Muscle weakness (generalized) (M62.81)                Time: 4944-9675 OT Time Calculation (min): 29 min Charges:  OT General Charges $OT Visit: 1 Visit OT Evaluation $OT Eval Moderate Complexity: 1 Mod OT Treatments $Self Care/Home Management : 8-22 mins  Martie Round, OTR/L Acute Rehabilitation Services Pager: [phone] Office: [phone]  Evern Bio 01/20/2020, 2:40 PM

## 2020-01-20 NOTE — Evaluation (Signed)
Physical Therapy Evaluation Patient Details Name: Elaine Mathews MRN: [id number] DOB: 19-Apr-1933 Today's Date: 01/20/2020   History of Present Illness  Pt is an 86 year old woman admitted 01/19/20 with chest pain and LE weakness. Troponin level negative. PMH: CAD with LAD stenting, PAF, labile BP, mild dementia, seizure disorder, DM2, HTN, CVA.  Clinical Impression  Pt admitted with above diagnosis. Pt presenting with mild deficits in strength, mobility, balance, safety, and endurance compared to her baseline.  She was able to perform transfers with supervision/min guard but with increased time and effort.  Required min guard for walking for safety.  Pt has aides at home but not always all day.  Pt would like to return home but would benefit from increased supervision initially and HHPT to advance.  Pt currently with functional limitations due to the deficits listed below (see PT Problem List). Pt will benefit from skilled PT to increase their independence and safety with mobility to allow discharge to the venue listed below.   Son reports pt has had difficulty with stairs in past and was interested in a ramp -pt could benefit from ramp for increased safety and decreased fall risk.     Follow Up Recommendations Home health PT;Supervision/Assistance - 24 hour    Equipment Recommendations  None recommended by PT    Recommendations for Other Services       Precautions / Restrictions Precautions Precautions: Fall      Mobility  Bed Mobility Overal bed mobility: Needs Assistance Bed Mobility: Supine to Sit;Sit to Supine     Supine to sit: Supervision;HOB elevated Sit to supine: Supervision;HOB elevated   General bed mobility comments: Supervision but with increased time and effort; assist for lines/leads    Transfers Overall transfer level: Needs assistance Equipment used: Rolling walker (2 wheeled) Transfers: Sit to/from Stand Sit to Stand: Min guard;Min assist         General  transfer comment: Sit to stand x 2 with RW and min guard for safety; performed 1 time sit to stand without RW for repositioning and required min A  Ambulation/Gait Ambulation/Gait assistance: Min guard Gait Distance (Feet): 200 Feet Assistive device: Rolling walker (2 wheeled) Gait Pattern/deviations: Decreased stride length;Step-through pattern;Drifts right/left Gait velocity: decreased   General Gait Details: Min guard for safety/steadying with cues for RW managment - pt used to rollator  Stairs            Wheelchair Mobility    Modified Rankin (Stroke Patients Only)       Balance Overall balance assessment: Needs assistance Sitting-balance support: No upper extremity supported Sitting balance-Leahy Scale: Good Sitting balance - Comments: no LOB with donning socks   Standing balance support: Bilateral upper extremity supported Standing balance-Leahy Scale: Poor Standing balance comment: reliant on UE support                             Pertinent Vitals/Pain Pain Assessment: No/denies pain    Home Living Family/patient expects to be discharged to:: Private residence Living Arrangements: Alone Available Help at Discharge: Family;Available PRN/intermittently;Personal care attendant (aide 8-4 weekday, 8-12 weekends) Type of Home: Apartment Home Access: Stairs to enter Entrance Stairs-Rails: Right Entrance Stairs-Number of Steps: 1+1 Home Layout: One level Home Equipment: Walker - 4 wheels;Bedside commode;Shower seat;Grab bars - tub/shower;Hand held shower head      Prior Function Level of Independence: Needs assistance   Gait / Transfers Assistance Needed: walks with rollator  ADL's /  Homemaking Assistance Needed: supervised for showering, assisted for IADL  Comments: son is changing aide agencies in January; history of falls     Hand Dominance   Dominant Hand: Right    Extremity/Trunk Assessment   Upper Extremity Assessment Upper Extremity  Assessment: Defer to OT evaluation    Lower Extremity Assessment Lower Extremity Assessment: RLE deficits/detail;LLE deficits/detail RLE Deficits / Details: ROM WFL: MMT 4/5 LLE Deficits / Details: ROM WFL: MMT 4/5    Cervical / Trunk Assessment Cervical / Trunk Assessment: Normal  Communication   Communication: No difficulties  Cognition Arousal/Alertness: Awake/alert Behavior During Therapy: WFL for tasks assessed/performed Overall Cognitive Status: History of cognitive impairments - at baseline                                        General Comments General comments (skin integrity, edema, etc.): VSS; Reports plan to return home - does not want rehab.  Son is looking into increased supervision from aides and he lives nearby.  Educated on safety and use of gait belt to assist - gave pt gait belt.  Discussed continued use of DME at home.  Discussed possible BSC for nighttime use but pt felt like it was unecessary as she rarely has to get up at night  Son reports pt has had mild difficulty with steps and is interested in a ramp.      Exercises     Assessment/Plan    PT Assessment Patient needs continued PT services  PT Problem List Decreased strength;Decreased mobility;Decreased safety awareness;Decreased activity tolerance;Decreased balance;Decreased knowledge of use of DME       PT Treatment Interventions DME instruction;Therapeutic activities;Gait training;Therapeutic exercise;Patient/family education;Balance training;Functional mobility training    PT Goals (Current goals can be found in the Care Plan section)  Acute Rehab PT Goals Patient Stated Goal: return home PT Goal Formulation: With patient/family Time For Goal Achievement: 02/03/20 Potential to Achieve Goals: Good    Frequency Min 3X/week   Barriers to discharge Decreased caregiver support      Co-evaluation               AM-PAC PT "6 Clicks" Mobility  Outcome Measure Help needed  turning from your back to your side while in a flat bed without using bedrails?: A Little Help needed moving from lying on your back to sitting on the side of a flat bed without using bedrails?: A Little Help needed moving to and from a bed to a chair (including a wheelchair)?: A Little Help needed standing up from a chair using your arms (e.g., wheelchair or bedside chair)?: A Little Help needed to walk in hospital room?: A Little Help needed climbing 3-5 steps with a railing? : A Little 6 Click Score: 18    End of Session Equipment Utilized During Treatment: Gait belt Activity Tolerance: Patient tolerated treatment well Patient left: in bed;with call bell/phone within reach;with bed alarm set Nurse Communication: Mobility status PT Visit Diagnosis: Unsteadiness on feet (R26.81);Muscle weakness (generalized) (M62.81)    Time: 2440-1027 PT Time Calculation (min) (ACUTE ONLY): 24 min   Charges:   PT Evaluation $PT Eval Low Complexity: 1 Low PT Treatments $Gait Training: 8-22 mins        Anise Salvo, PT Acute Rehab Services Pager [phone] [facility] [phone]    Rayetta Humphrey 01/20/2020, 3:15 PM

## 2020-01-20 NOTE — Progress Notes (Signed)
Pt stated she was having mild 'fluttering' chest pain. EKG done, SR with RBBB. MD notified. Troponin ordered. Will continue to monitor.

## 2020-01-21 DIAGNOSIS — N3000 Acute cystitis without hematuria: Secondary | ICD-10-CM | POA: Diagnosis not present

## 2020-01-21 DIAGNOSIS — I480 Paroxysmal atrial fibrillation: Secondary | ICD-10-CM | POA: Diagnosis present

## 2020-01-21 DIAGNOSIS — N390 Urinary tract infection, site not specified: Secondary | ICD-10-CM | POA: Diagnosis present

## 2020-01-21 DIAGNOSIS — F0390 Unspecified dementia without behavioral disturbance: Secondary | ICD-10-CM | POA: Diagnosis present

## 2020-01-21 DIAGNOSIS — I252 Old myocardial infarction: Secondary | ICD-10-CM | POA: Diagnosis not present

## 2020-01-21 DIAGNOSIS — G40909 Epilepsy, unspecified, not intractable, without status epilepticus: Secondary | ICD-10-CM | POA: Diagnosis present

## 2020-01-21 DIAGNOSIS — E7800 Pure hypercholesterolemia, unspecified: Secondary | ICD-10-CM | POA: Diagnosis present

## 2020-01-21 DIAGNOSIS — K760 Fatty (change of) liver, not elsewhere classified: Secondary | ICD-10-CM | POA: Diagnosis present

## 2020-01-21 DIAGNOSIS — E1151 Type 2 diabetes mellitus with diabetic peripheral angiopathy without gangrene: Secondary | ICD-10-CM | POA: Diagnosis present

## 2020-01-21 DIAGNOSIS — I2510 Atherosclerotic heart disease of native coronary artery without angina pectoris: Secondary | ICD-10-CM | POA: Diagnosis present

## 2020-01-21 DIAGNOSIS — E871 Hypo-osmolality and hyponatremia: Secondary | ICD-10-CM | POA: Diagnosis present

## 2020-01-21 DIAGNOSIS — Z20822 Contact with and (suspected) exposure to covid-19: Secondary | ICD-10-CM | POA: Diagnosis present

## 2020-01-21 DIAGNOSIS — Z8673 Personal history of transient ischemic attack (TIA), and cerebral infarction without residual deficits: Secondary | ICD-10-CM | POA: Diagnosis not present

## 2020-01-21 DIAGNOSIS — I5032 Chronic diastolic (congestive) heart failure: Secondary | ICD-10-CM | POA: Diagnosis present

## 2020-01-21 DIAGNOSIS — Z7984 Long term (current) use of oral hypoglycemic drugs: Secondary | ICD-10-CM | POA: Diagnosis not present

## 2020-01-21 DIAGNOSIS — Z825 Family history of asthma and other chronic lower respiratory diseases: Secondary | ICD-10-CM | POA: Diagnosis not present

## 2020-01-21 DIAGNOSIS — E039 Hypothyroidism, unspecified: Secondary | ICD-10-CM | POA: Diagnosis present

## 2020-01-21 DIAGNOSIS — J309 Allergic rhinitis, unspecified: Secondary | ICD-10-CM | POA: Diagnosis present

## 2020-01-21 DIAGNOSIS — I110 Hypertensive heart disease with heart failure: Secondary | ICD-10-CM | POA: Diagnosis present

## 2020-01-21 DIAGNOSIS — Z823 Family history of stroke: Secondary | ICD-10-CM | POA: Diagnosis not present

## 2020-01-21 DIAGNOSIS — E785 Hyperlipidemia, unspecified: Secondary | ICD-10-CM | POA: Diagnosis present

## 2020-01-21 DIAGNOSIS — R0989 Other specified symptoms and signs involving the circulatory and respiratory systems: Secondary | ICD-10-CM | POA: Diagnosis present

## 2020-01-21 DIAGNOSIS — Z7901 Long term (current) use of anticoagulants: Secondary | ICD-10-CM | POA: Diagnosis not present

## 2020-01-21 DIAGNOSIS — Z66 Do not resuscitate: Secondary | ICD-10-CM | POA: Diagnosis present

## 2020-01-21 DIAGNOSIS — Z8249 Family history of ischemic heart disease and other diseases of the circulatory system: Secondary | ICD-10-CM | POA: Diagnosis not present

## 2020-01-21 DIAGNOSIS — R072 Precordial pain: Secondary | ICD-10-CM | POA: Diagnosis not present

## 2020-01-21 HISTORY — DX: Urinary tract infection, site not specified: N39.0

## 2020-01-21 LAB — GLUCOSE, CAPILLARY
Glucose-Capillary: 120 mg/dL — ABNORMAL HIGH (ref 70–99)
Glucose-Capillary: 131 mg/dL — ABNORMAL HIGH (ref 70–99)

## 2020-01-21 LAB — CBC WITH DIFFERENTIAL/PLATELET
Abs Immature Granulocytes: 0.01 10*3/uL (ref 0.00–0.07)
Basophils Absolute: 0 10*3/uL (ref 0.0–0.1)
Basophils Relative: 0 %
Eosinophils Absolute: 0.3 10*3/uL (ref 0.0–0.5)
Eosinophils Relative: 6 %
HCT: 29.9 % — ABNORMAL LOW (ref 36.0–46.0)
Hemoglobin: 10.2 g/dL — ABNORMAL LOW (ref 12.0–15.0)
Immature Granulocytes: 0 %
Lymphocytes Relative: 28 %
Lymphs Abs: 1.4 10*3/uL (ref 0.7–4.0)
MCH: 29.9 pg (ref 26.0–34.0)
MCHC: 34.1 g/dL (ref 30.0–36.0)
MCV: 87.7 fL (ref 80.0–100.0)
Monocytes Absolute: 0.3 10*3/uL (ref 0.1–1.0)
Monocytes Relative: 7 %
Neutro Abs: 2.9 10*3/uL (ref 1.7–7.7)
Neutrophils Relative %: 59 %
Platelets: 123 10*3/uL — ABNORMAL LOW (ref 150–400)
RBC: 3.41 MIL/uL — ABNORMAL LOW (ref 3.87–5.11)
RDW: 15.2 % (ref 11.5–15.5)
WBC: 4.9 10*3/uL (ref 4.0–10.5)
nRBC: 0 % (ref 0.0–0.2)

## 2020-01-21 LAB — BASIC METABOLIC PANEL
Anion gap: 11 (ref 5–15)
BUN: 10 mg/dL (ref 8–23)
CO2: 23 mmol/L (ref 22–32)
Calcium: 8.5 mg/dL — ABNORMAL LOW (ref 8.9–10.3)
Chloride: 97 mmol/L — ABNORMAL LOW (ref 98–111)
Creatinine, Ser: 0.59 mg/dL (ref 0.44–1.00)
GFR, Estimated: >60 mL/min (ref >60)
Glucose, Bld: 110 mg/dL — ABNORMAL HIGH (ref 70–99)
Potassium: 3.6 mmol/L (ref 3.5–5.1)
Sodium: 131 mmol/L — ABNORMAL LOW (ref 135–145)

## 2020-01-21 LAB — URINE CULTURE: Culture: >=100000 — AB

## 2020-01-21 MED ORDER — RIVAROXABAN 15 MG PO TABS: 15.0000 mg | ORAL_TABLET | Freq: Every day | ORAL | Status: DC

## 2020-01-21 MED ORDER — CEFDINIR 300 MG PO CAPS: 300.0000 mg | ORAL_CAPSULE | Freq: Two times a day (BID) | ORAL | Status: DC

## 2020-01-21 MED ORDER — CEFDINIR 300 MG PO CAPS: 300.0000 mg | ORAL_CAPSULE | Freq: Two times a day (BID) | ORAL | 0 refills | Status: AC

## 2020-01-21 MED ORDER — CARVEDILOL 3.125 MG PO TABS
3.1250 mg | ORAL_TABLET | Freq: Two times a day (BID) | ORAL | 0 refills | Status: DC
Start: 2020-01-21 — End: 2020-02-22

## 2020-01-21 NOTE — Progress Notes (Signed)
D/C instructions given and reviewed. Questions asked and answered. Tele and IV removed, tolerated well. 

## 2020-01-21 NOTE — Progress Notes (Signed)
Occupational Therapy Treatment Patient Details Name: Elaine Mathews MRN: [id number] DOB: 07-29-1933 Today's Date: 01/21/2020    History of present illness Pt is an 86 year old woman admitted 01/19/20 with chest pain and LE weakness. Troponin level negative. PMH: CAD with LAD stenting, PAF, labile BP, mild dementia, seizure disorder, DM2, HTN, CVA.   OT comments  Pt making good progress with functional goals. Pt to d/c home this afternoon. Educated pt and her son on ADL and ADL mobility safety.   Follow Up Recommendations  Home health OT;Supervision/Assistance - 24 hour    Equipment Recommendations  None recommended by OT    Recommendations for Other Services      Precautions / Restrictions Precautions Precautions: Fall Restrictions Weight Bearing Restrictions: No       Mobility Bed Mobility Overal bed mobility: Needs Assistance Bed Mobility: Supine to Sit;Sit to Supine     Supine to sit: Supervision;HOB elevated Sit to supine: Supervision;HOB elevated   General bed mobility comments: Supervision but with increased time and effort; assist for lines/leads  Transfers Overall transfer level: Needs assistance Equipment used: Rolling walker (2 wheeled) Transfers: Sit to/from Stand Sit to Stand: Min assist         General transfer comment: Sit to stand x 2 with RW and min guard for safety    Balance Overall balance assessment: Needs assistance Sitting-balance support: No upper extremity supported Sitting balance-Leahy Scale: Good     Standing balance support: Bilateral upper extremity supported Standing balance-Leahy Scale: Poor                             ADL either performed or assessed with clinical judgement   ADL Overall ADL's : Needs assistance/impaired     Grooming: Supervision/safety;Sitting;Wash/dry hands;Wash/dry face   Upper Body Bathing: Set up;Supervision/ safety;Sitting   Lower Body Bathing: Minimal assistance;Sit to/from  stand Lower Body Bathing Details (indicate cue type and reason): simulated Upper Body Dressing : Supervision/safety;Sitting       Toilet Transfer: Minimal assistance;RW;Cueing for safety   Toileting- Clothing Manipulation and Hygiene: Min guard;Sit to/from stand       Functional mobility during ADLs: Minimal assistance;Rolling walker;Cueing for safety       Vision Baseline Vision/History: No visual deficits Patient Visual Report: No change from baseline     Perception     Praxis      Cognition Arousal/Alertness: Awake/alert Behavior During Therapy: WFL for tasks assessed/performed Overall Cognitive Status: History of cognitive impairments - at baseline                                          Exercises     Shoulder Instructions       General Comments      Pertinent Vitals/ Pain       Pain Assessment: No/denies pain  Home Living                                          Prior Functioning/Environment              Frequency  Min 2X/week        Progress Toward Goals  OT Goals(current goals can now be found in the care plan section)  Progress towards  OT goals: Progressing toward goals  Acute Rehab OT Goals Patient Stated Goal: return home  Plan Discharge plan remains appropriate    Co-evaluation                 AM-PAC OT "6 Clicks" Daily Activity     Outcome Measure   Help from another person eating meals?: None Help from another person taking care of personal grooming?: A Little Help from another person toileting, which includes using toliet, bedpan, or urinal?: A Little Help from another person bathing (including washing, rinsing, drying)?: A Little Help from another person to put on and taking off regular upper body clothing?: None Help from another person to put on and taking off regular lower body clothing?: A Little 6 Click Score: 20    End of Session Equipment Utilized During Treatment: Gait  belt;Rolling walker  OT Visit Diagnosis: Unsteadiness on feet (R26.81);Other abnormalities of gait and mobility (R26.89);Other symptoms and signs involving cognitive function;Muscle weakness (generalized) (M62.81)   Activity Tolerance Patient tolerated treatment well   Patient Left in bed;with call bell/phone within reach;with family/visitor present   Nurse Communication          Time: 6606-3016 OT Time Calculation (min): 26 min  Charges: OT General Charges $OT Visit: 1 Visit OT Treatments $Self Care/Home Management : 8-22 mins $Therapeutic Activity: 8-22 mins     Galen Manila 01/21/2020, 2:59 PM

## 2020-01-21 NOTE — Discharge Summary (Signed)
Discharge Summary  Elaine Mathews:[id number] DOB: 07-07-1933  PCP: Nicholos Johns, MD  Admit date: 01/18/2020 Discharge date: 01/21/2020  Time spent: 40 mins   Recommendations for Outpatient Follow-up:  1. PCP in 1 week 2. Cardiology as scheduled  Discharge Diagnoses:  Active Hospital Problems   Diagnosis Date Noted  . UTI (urinary tract infection) 01/21/2020  . Substernal chest pain 01/18/2020    Resolved Hospital Problems  No resolved problems to display.    Discharge Condition: Stable  Diet recommendation: Heart healthy  Vitals:   01/20/20 2000 01/21/20 0300  BP: (!) 111/58 (!) 140/54  Pulse: 66 61  Resp: 15 14  Temp: 98.3 F (36.8 C) 98 F (36.7 C)  SpO2: 90% 94%    History of present illness:  Elaine Mathews a 86 y.o.femalewith medical history significant ofCAD with LAD stenting x1 last year, make ambulation dysfunction, PAF on Xarelto, labile BP of BP medications,mild dementia, seizure disorder, IIDM,presented with multiple complaints.  C/O chest tightness 7-8 over 10, localized retrosternal, whole episode lasted about 15 minutes and resolved by its own. Denied any shortness of breath, no nauseous vomiting or diaphoresis. Meantime, she also experienced wobbly gait,some lightheadedness and temporary confusion which all resolved. Patient has a chronic ambulation dysfunction, started use rolling walker since last year. It appears that the patient has had labile blood pressure, has been following with her cardiologist who discontinued Cardizem and diuresis on last admission. ED Course:Troponin negative x2, blood pressure significantly elevated. EKG is reassuring.  Patient admitted for further management.    Today, met patient enjoying her breakfast, denied any further chest pain, shortness of breath, abdominal pain, nausea/vomiting, fever/chills.  Patient stable to be discharged with follow-up with PCP in 1 week, and cardiology as scheduled.  Discharge  patient with home health PT/OT/aide.  Discussed discharge plans with the patient's son.   Hospital Course:  Active Problems:   Substernal chest pain   UTI (urinary tract infection)   Chest pain History of CAD Resolved Trop x2 -, EKG with no acute ST changes Cardiac cath and stenting in June 2020 Echo done 9/21 showed EF of 70 to 75%, no regional wall motion abnormalities, grade 1 diastolic dysfunction Continue Coreg, Lipitor, Plavix Follow-up with cardiology  Uncontrolled hypertension/labile BP Stable Continue Coreg Follow-up with cardiology, PCP  Paroxysmal A. Fib Rate controlled, continue Xarelto, Coreg  UTI UA showed large leukocytes, greater than 50 WBC, rare bacteria UC >100,000 gram-negative rods S/p IV ceftriaxone--> switch to PO Cefdinir to complete 7 days  Follow-up with PCP  Hyponatremia Continue salt tablets Follow-up with PCP, with repeat labs  Diabetes mellitus type 2 Continue home Metformin  Seizure disorder Continue Keppra  Acute on chronic ambulation dysfunction CT head unremarkable B12 WNL, RPR nonreactive PT/OT eval-recommend home health PT/OT  Dementia Continue home meds       Malnutrition Type:      Malnutrition Characteristics:      Nutrition Interventions:      Estimated body mass index is 20.49 kg/m as calculated from the following:   Height as of this encounter: _0  (1.626 m).   Weight as of this encounter: 54.2 kg.    Procedures:  None  Consultations:  None  Discharge Exam: BP (!) 140/54 (BP Location: Left Arm)   Pulse 61   Temp 98 F (36.7 C) (Oral)   Resp 14   Ht _1  (1.626 m)   Wt 54.2 kg   SpO2 94%   BMI 20.49 kg/m  General: NAD Cardiovascular: S1, S2 present Respiratory: CTA B  Discharge Instructions You were cared for by a hospitalist during your hospital stay. If you have any questions about your discharge medications or the care you received while you were in the hospital  after you are discharged, you can call the unit and asked to speak with the hospitalist on call if the hospitalist that took care of you is not available. Once you are discharged, your primary care physician will handle any further medical issues. Please note that NO REFILLS for any discharge medications will be authorized once you are discharged, as it is imperative that you return to your primary care physician (or establish a relationship with a primary care physician if you do not have one) for your aftercare needs so that they can reassess your need for medications and monitor your lab values.  Discharge Instructions    Diet - low sodium heart healthy   Complete by: As directed    Increase activity slowly   Complete by: As directed      Allergies as of 01/21/2020      Reactions   Other Diarrhea   Reaction to canteloupe   Codeine Rash   Penicillins Rash      Medication List    TAKE these medications   Artificial Tears 1.4 % ophthalmic solution Generic drug: polyvinyl alcohol Place 1 drop into both eyes as needed for dry eyes.   atorvastatin 40 MG tablet Commonly known as: LIPITOR Take 1 tablet (40 mg total) by mouth daily at 6 PM.   carvedilol 3.125 MG tablet Commonly known as: COREG Take 1 tablet (3.125 mg total) by mouth 2 (two) times daily with a meal.   cefdinir 300 MG capsule Commonly known as: OMNICEF Take 1 capsule (300 mg total) by mouth every 12 (twelve) hours for 5 days. Start taking on: January 22, 2020   clopidogrel 75 MG tablet Commonly known as: PLAVIX Take 1 tablet (75 mg total) by mouth daily with breakfast.   ezetimibe 10 MG tablet Commonly known as: ZETIA Take 10 mg by mouth every evening.   famotidine 40 MG tablet Commonly known as: PEPCID Take 40 mg by mouth daily.   Florajen3 Caps Take 1 capsule by mouth daily.   Glucosamine Chondroitin Triple Tabs Take 2 tablets by mouth every other day.   levETIRAcetam 250 MG tablet Commonly known  as: KEPPRA TAKE 1 TABLET BY MOUTH EVERY MORNING ANDTAKE 2 TABLETS EVERY EVENING What changed: See the new instructions.   levothyroxine 75 MCG tablet Commonly known as: SYNTHROID Take 75 mcg by mouth daily before breakfast.   memantine 10 MG tablet Commonly known as: NAMENDA Take 10 mg by mouth 2 (two) times daily.   metFORMIN 500 MG tablet Commonly known as: GLUCOPHAGE Take 250 mg by mouth 2 (two) times daily.   montelukast 10 MG tablet Commonly known as: SINGULAIR Take 10 mg by mouth every evening.   nitroGLYCERIN 0.4 MG SL tablet Commonly known as: NITROSTAT Place 1 tablet (0.4 mg total) under the tongue every 5 (five) minutes as needed for chest pain.   potassium chloride 10 MEQ tablet Commonly known as: KLOR-CON Take 1 tablet (10 mEq total) by mouth daily.   PRENATAL VITAMIN PO Take 1 tablet by mouth daily.   rivaroxaban 20 MG Tabs tablet Commonly known as: XARELTO Take 1 tablet (20 mg total) by mouth daily with supper.   sertraline 50 MG tablet Commonly known as: ZOLOFT Take 50 mg by  mouth daily.   sodium chloride 1 g tablet Take 1 g by mouth daily.   Vitamin D (Ergocalciferol) 1.25 MG (50000 UNIT) Caps capsule Commonly known as: DRISDOL Take 50,000 Units by mouth every Wednesday.      Allergies  Allergen Reactions  . Other Diarrhea    Reaction to canteloupe  . Codeine Rash  . Penicillins Rash    Follow-up Information    Nicholos Johns, MD.   Specialty: Internal Medicine Contact information: [place] [address] [phone]        Revankar, Reita Cliche, MD Follow up.   Specialty: Cardiology Why: Follow up as scheduled Contact information: [address] [place] [address] [phone]                The results of significant diagnostics from this hospitalization (including imaging, microbiology, ancillary and laboratory) are listed below for reference.    Significant Diagnostic Studies: DG Chest 2  View  Result Date: 01/18/2020 CLINICAL DATA:  Chest pain EXAM: CHEST - 2 VIEW COMPARISON:  10/28/2019. FINDINGS: The heart size and mediastinal contours are within normal limits. Aortic atherosclerosis. Coronary artery stent. Both lungs are clear. No visible pleural effusions or pneumothorax. No acute osseous abnormality. Similar eventration of the right hemidiaphragm. Cholecystectomy clips. IMPRESSION: No active cardiopulmonary disease. Electronically Signed   By: Margaretha Sheffield MD   On: 01/18/2020 13:24   CT HEAD WO CONTRAST  Result Date: 01/19/2020 CLINICAL DATA:  Nonspecific dizziness EXAM: CT HEAD WITHOUT CONTRAST TECHNIQUE: Contiguous axial images were obtained from the base of the skull through the vertex without intravenous contrast. COMPARISON:  07/14/2018 FINDINGS: Brain: No evidence of acute infarction, hemorrhage, hydrocephalus, extra-axial collection or mass lesion/mass effect. Remote right parietal cortex based infarct which is moderate size. Confluent chronic small vessel ischemia in the cerebral white matter. Cerebral volume loss which is generalized. Vascular: No hyperdense vessel or unexpected calcification. Skull: Normal. Negative for fracture or focal lesion. Sinuses/Orbits: Chronic completely opacified left frontal sinus. Bilateral cataract resection. IMPRESSION: 1. No acute or reversible finding. 2. Chronic small vessel ischemia and remote right parietal infarct. Electronically Signed   By: Monte Fantasia M.D.   On: 01/19/2020 10:37   Cardiac event monitor  Result Date: 12/29/2019 Lerin, Jech [birth year], MRN [id number] EVENT MONITOR REPORT: Patient was monitored from 11/17/2019 to 12/16/2019. Indication:                    Syncope and collapse Ordering physician:  Jenean Lindau, MD Referring physician:  Jenean Lindau, MD Baseline rhythm: Sinus Minimum heart rate: 60 BPM.   Maximal heart rate 129 PM. Atrial arrhythmia: Rare PACs.  2 episodes of very brief paroxysms of  atrial fibrillation. Ventricular arrhythmia: None significant rare PVCs Conduction abnormality: None significant Symptoms: None significant Conclusion: 2 episodes of very brief paroxysms of atrial fibrillation. Interpreting  cardiologist: Jenean Lindau, MD Date: 12/29/2019 12:36 PM   Microbiology: Recent Results (from the past 240 hour(s))  Resp Panel by RT-PCR (Flu A&B, Covid) Nasopharyngeal Swab     Status: None   Collection Time: 01/18/20  5:56 PM   Specimen: Nasopharyngeal Swab; Nasopharyngeal(NP) swabs in vial transport medium  Result Value Ref Range Status   SARS Coronavirus 2 by RT PCR NEGATIVE NEGATIVE Final    Comment: (NOTE) SARS-CoV-2 target nucleic acids are NOT DETECTED.  The SARS-CoV-2 RNA is generally detectable in upper respiratory specimens during the acute phase of infection. The lowest  concentration of SARS-CoV-2 viral copies this assay can detect is 138 copies/mL. A negative result does not preclude SARS-Cov-2 infection and should not be used as the sole basis for treatment or other patient management decisions. A negative result may occur with  improper specimen collection/handling, submission of specimen other than nasopharyngeal swab, presence of viral mutation(s) within the areas targeted by this assay, and inadequate number of viral copies(<138 copies/mL). A negative result must be combined with clinical observations, patient history, and epidemiological information. The expected result is Negative.  Fact Sheet for Patients:  EntrepreneurPulse.com.au  Fact Sheet for Healthcare Providers:  IncredibleEmployment.be  This test is no t yet approved or cleared by the [place] FDA and  has been authorized for detection and/or diagnosis of SARS-CoV-2 by FDA under an Emergency Use Authorization (EUA). This EUA will remain  in effect (meaning this test can be used) for the duration of the COVID-19 declaration under Section  564(b)(1) of the Act, 21 U.S.C.section 360bbb-3(b)(1), unless the authorization is terminated  or revoked sooner.       Influenza A by PCR NEGATIVE NEGATIVE Final   Influenza B by PCR NEGATIVE NEGATIVE Final    Comment: (NOTE) The Xpert Xpress SARS-CoV-2/FLU/RSV plus assay is intended as an aid in the diagnosis of influenza from Nasopharyngeal swab specimens and should not be used as a sole basis for treatment. Nasal washings and aspirates are unacceptable for Xpert Xpress SARS-CoV-2/FLU/RSV testing.  Fact Sheet for Patients: EntrepreneurPulse.com.au  Fact Sheet for Healthcare Providers: IncredibleEmployment.be  This test is not yet approved or cleared by the [place] FDA and has been authorized for detection and/or diagnosis of SARS-CoV-2 by FDA under an Emergency Use Authorization (EUA). This EUA will remain in effect (meaning this test can be used) for the duration of the COVID-19 declaration under Section 564(b)(1) of the Act, 21 U.S.C. section 360bbb-3(b)(1), unless the authorization is terminated or revoked.  Performed at [facility], [place] [address] [place], [place] [address]   Culture, Urine     Status: Abnormal   Collection Time: 01/19/20 10:55 AM   Specimen: Urine, Random  Result Value Ref Range Status   Specimen Description URINE, RANDOM  Final   Special Requests   Final    NONE Performed at [facility], [place] [address] [place], [place] [address]    Culture >=100,000 COLONIES/mL ESCHERICHIA COLI (A)  Final   Report Status 01/21/2020 FINAL  Final   Organism ID, Bacteria ESCHERICHIA COLI (A)  Final      Susceptibility   Escherichia coli - MIC*    AMPICILLIN >=32 RESISTANT Resistant     CEFAZOLIN <=4 SENSITIVE Sensitive     CEFEPIME <=0.12 SENSITIVE Sensitive     CEFTRIAXONE <=0.25 SENSITIVE Sensitive     CIPROFLOXACIN >=4 RESISTANT Resistant     GENTAMICIN <=1 SENSITIVE Sensitive     IMIPENEM <=0.25  SENSITIVE Sensitive     NITROFURANTOIN <=16 SENSITIVE Sensitive     TRIMETH/SULFA >=320 RESISTANT Resistant     AMPICILLIN/SULBACTAM 4 SENSITIVE Sensitive     PIP/TAZO <=4 SENSITIVE Sensitive     * >=100,000 COLONIES/mL ESCHERICHIA COLI     Labs: Basic Metabolic Panel: Recent Labs  Lab 01/18/20 1204 01/19/20 0941 01/20/20 0342 01/21/20 0238  NA 132* 132* 133* 131*  K 3.7 3.7 4.4 3.6  CL 95* 95* 97* 97*  CO2 _0 GLUCOSE 123* 107* 121* 110*  BUN 7* _1 CREATININE 0.54 0.55  0.91 0.59  CALCIUM 9.4 9.2 9.1 8.5*   Liver Function Tests: No results for input(s): AST, ALT, ALKPHOS, BILITOT, PROT, ALBUMIN in the last 168 hours. No results for input(s): LIPASE, AMYLASE in the last 168 hours. No results for input(s): AMMONIA in the last 168 hours. CBC: Recent Labs  Lab 01/18/20 1204 01/19/20 0941 01/20/20 0342 01/21/20 0238  WBC 6.5 5.7 6.1 4.9  NEUTROABS  --  4.0 4.1 2.9  HGB 12.1 11.8* 10.6* 10.2*  HCT 37.3 34.2* 31.1* 29.9*  MCV 89.9 87.5 88.4 87.7  PLT 167 153 137* 123*   Cardiac Enzymes: No results for input(s): CKTOTAL, CKMB, CKMBINDEX, TROPONINI in the last 168 hours. BNP: BNP (last 3 results) Recent Labs    10/28/19 1729  BNP 114.6*    ProBNP (last 3 results) No results for input(s): PROBNP in the last 8760 hours.  CBG: Recent Labs  Lab 01/20/20 2138 01/21/20 0606  GLUCAP 132* 120*       Signed:  Alma Friendly, MD [facility] 01/21/2020, 11:12 AM

## 2020-01-21 NOTE — TOC Transition Note (Signed)
Transition of Care [facility]) - CM/SW Discharge Note   Patient Details  Name: Elaine Mathews MRN: [id number] Date of Birth: [birth year]  [facility] CM/SW Contact:  Leone Haven, RN Phone Number: 01/21/2020, 12:20 PM   Clinical Narrative:    Patient is for dc today, NCM spoke with patient she states to call her son, NCM contacted her son, offered choice, he chose [facility] for HHPT, HHOT, HHAIDE.  NCM made referral to [facility] she states patient is till open with them, they will be able to do soc on Monday or Tuesday next week. Patient has a walker at home, she lives alone,but her son comes to check on her often thru out the day. She also has meals on wheels, they bring her microwave able meals. NCM willl fax dc summary and orders to [facility].   Final next level of care: Home w Home Health Services Barriers to Discharge: No Barriers Identified   Patient Goals and CMS Choice Patient states their goals for this hospitalization and ongoing recovery are:: get better CMS Medicare.gov Compare Post Acute Care list provided to:: Patient Represenative (must comment) Choice offered to / list presented to : Adult Children  Discharge Placement                       Discharge Plan and Services                  DME Agency: NA       HH Arranged: PT,OT,Nurse's Aide HH Agency: [facility] Date [facility] Agency Contacted: 01/21/20 Time HH Agency Contacted: 1220 Representative spoke with at [facility] Agency: Alvino Chapel  Social Determinants of Health (SDOH) Interventions     Readmission Risk Interventions No flowsheet data found.

## 2020-01-24 DIAGNOSIS — Z7901 Long term (current) use of anticoagulants: Secondary | ICD-10-CM | POA: Diagnosis not present

## 2020-01-24 DIAGNOSIS — Z8673 Personal history of transient ischemic attack (TIA), and cerebral infarction without residual deficits: Secondary | ICD-10-CM | POA: Diagnosis not present

## 2020-01-24 DIAGNOSIS — E538 Deficiency of other specified B group vitamins: Secondary | ICD-10-CM | POA: Diagnosis not present

## 2020-01-24 DIAGNOSIS — Z7902 Long term (current) use of antithrombotics/antiplatelets: Secondary | ICD-10-CM | POA: Diagnosis not present

## 2020-01-24 DIAGNOSIS — R296 Repeated falls: Secondary | ICD-10-CM | POA: Diagnosis not present

## 2020-01-24 DIAGNOSIS — J45909 Unspecified asthma, uncomplicated: Secondary | ICD-10-CM | POA: Diagnosis not present

## 2020-01-24 DIAGNOSIS — E871 Hypo-osmolality and hyponatremia: Secondary | ICD-10-CM | POA: Diagnosis not present

## 2020-01-24 DIAGNOSIS — K219 Gastro-esophageal reflux disease without esophagitis: Secondary | ICD-10-CM | POA: Diagnosis not present

## 2020-01-24 DIAGNOSIS — M5440 Lumbago with sciatica, unspecified side: Secondary | ICD-10-CM | POA: Diagnosis not present

## 2020-01-24 DIAGNOSIS — Z9181 History of falling: Secondary | ICD-10-CM | POA: Diagnosis not present

## 2020-01-24 DIAGNOSIS — I10 Essential (primary) hypertension: Secondary | ICD-10-CM | POA: Diagnosis not present

## 2020-01-24 DIAGNOSIS — Z87891 Personal history of nicotine dependence: Secondary | ICD-10-CM | POA: Diagnosis not present

## 2020-01-24 DIAGNOSIS — E782 Mixed hyperlipidemia: Secondary | ICD-10-CM | POA: Diagnosis not present

## 2020-01-24 DIAGNOSIS — E039 Hypothyroidism, unspecified: Secondary | ICD-10-CM | POA: Diagnosis not present

## 2020-01-24 DIAGNOSIS — Z7984 Long term (current) use of oral hypoglycemic drugs: Secondary | ICD-10-CM | POA: Diagnosis not present

## 2020-01-24 DIAGNOSIS — N390 Urinary tract infection, site not specified: Secondary | ICD-10-CM | POA: Diagnosis not present

## 2020-01-24 DIAGNOSIS — M159 Polyosteoarthritis, unspecified: Secondary | ICD-10-CM | POA: Diagnosis not present

## 2020-01-24 DIAGNOSIS — E559 Vitamin D deficiency, unspecified: Secondary | ICD-10-CM | POA: Diagnosis not present

## 2020-01-24 DIAGNOSIS — E1151 Type 2 diabetes mellitus with diabetic peripheral angiopathy without gangrene: Secondary | ICD-10-CM | POA: Diagnosis not present

## 2020-01-24 DIAGNOSIS — I480 Paroxysmal atrial fibrillation: Secondary | ICD-10-CM | POA: Diagnosis not present

## 2020-01-24 DIAGNOSIS — E1142 Type 2 diabetes mellitus with diabetic polyneuropathy: Secondary | ICD-10-CM | POA: Diagnosis not present

## 2020-01-24 DIAGNOSIS — K760 Fatty (change of) liver, not elsewhere classified: Secondary | ICD-10-CM | POA: Diagnosis not present

## 2020-01-24 DIAGNOSIS — F32A Depression, unspecified: Secondary | ICD-10-CM | POA: Diagnosis not present

## 2020-01-26 DIAGNOSIS — E1142 Type 2 diabetes mellitus with diabetic polyneuropathy: Secondary | ICD-10-CM | POA: Diagnosis not present

## 2020-01-26 DIAGNOSIS — E871 Hypo-osmolality and hyponatremia: Secondary | ICD-10-CM | POA: Diagnosis not present

## 2020-01-26 DIAGNOSIS — J45909 Unspecified asthma, uncomplicated: Secondary | ICD-10-CM | POA: Diagnosis not present

## 2020-01-26 DIAGNOSIS — K219 Gastro-esophageal reflux disease without esophagitis: Secondary | ICD-10-CM | POA: Diagnosis not present

## 2020-01-26 DIAGNOSIS — I10 Essential (primary) hypertension: Secondary | ICD-10-CM | POA: Diagnosis not present

## 2020-01-26 DIAGNOSIS — M159 Polyosteoarthritis, unspecified: Secondary | ICD-10-CM | POA: Diagnosis not present

## 2020-01-26 DIAGNOSIS — E039 Hypothyroidism, unspecified: Secondary | ICD-10-CM | POA: Diagnosis not present

## 2020-01-26 DIAGNOSIS — Z7984 Long term (current) use of oral hypoglycemic drugs: Secondary | ICD-10-CM | POA: Diagnosis not present

## 2020-01-26 DIAGNOSIS — Z7901 Long term (current) use of anticoagulants: Secondary | ICD-10-CM | POA: Diagnosis not present

## 2020-01-26 DIAGNOSIS — K760 Fatty (change of) liver, not elsewhere classified: Secondary | ICD-10-CM | POA: Diagnosis not present

## 2020-01-26 DIAGNOSIS — F32A Depression, unspecified: Secondary | ICD-10-CM | POA: Diagnosis not present

## 2020-01-26 DIAGNOSIS — N390 Urinary tract infection, site not specified: Secondary | ICD-10-CM | POA: Diagnosis not present

## 2020-01-26 DIAGNOSIS — E1151 Type 2 diabetes mellitus with diabetic peripheral angiopathy without gangrene: Secondary | ICD-10-CM | POA: Diagnosis not present

## 2020-01-26 DIAGNOSIS — Z87891 Personal history of nicotine dependence: Secondary | ICD-10-CM | POA: Diagnosis not present

## 2020-01-26 DIAGNOSIS — Z9181 History of falling: Secondary | ICD-10-CM | POA: Diagnosis not present

## 2020-01-26 DIAGNOSIS — R296 Repeated falls: Secondary | ICD-10-CM | POA: Diagnosis not present

## 2020-01-26 DIAGNOSIS — Z7902 Long term (current) use of antithrombotics/antiplatelets: Secondary | ICD-10-CM | POA: Diagnosis not present

## 2020-01-26 DIAGNOSIS — I480 Paroxysmal atrial fibrillation: Secondary | ICD-10-CM | POA: Diagnosis not present

## 2020-01-26 DIAGNOSIS — E538 Deficiency of other specified B group vitamins: Secondary | ICD-10-CM | POA: Diagnosis not present

## 2020-01-26 DIAGNOSIS — E559 Vitamin D deficiency, unspecified: Secondary | ICD-10-CM | POA: Diagnosis not present

## 2020-01-26 DIAGNOSIS — E782 Mixed hyperlipidemia: Secondary | ICD-10-CM | POA: Diagnosis not present

## 2020-01-26 DIAGNOSIS — M5440 Lumbago with sciatica, unspecified side: Secondary | ICD-10-CM | POA: Diagnosis not present

## 2020-01-26 DIAGNOSIS — Z8673 Personal history of transient ischemic attack (TIA), and cerebral infarction without residual deficits: Secondary | ICD-10-CM | POA: Diagnosis not present

## 2020-01-27 DIAGNOSIS — E782 Mixed hyperlipidemia: Secondary | ICD-10-CM | POA: Diagnosis not present

## 2020-01-27 DIAGNOSIS — R600 Localized edema: Secondary | ICD-10-CM | POA: Diagnosis not present

## 2020-01-27 DIAGNOSIS — Z23 Encounter for immunization: Secondary | ICD-10-CM | POA: Diagnosis not present

## 2020-01-27 DIAGNOSIS — Z09 Encounter for follow-up examination after completed treatment for conditions other than malignant neoplasm: Secondary | ICD-10-CM | POA: Diagnosis not present

## 2020-01-27 DIAGNOSIS — E1140 Type 2 diabetes mellitus with diabetic neuropathy, unspecified: Secondary | ICD-10-CM | POA: Diagnosis not present

## 2020-01-27 DIAGNOSIS — Z79899 Other long term (current) drug therapy: Secondary | ICD-10-CM | POA: Diagnosis not present

## 2020-01-31 ENCOUNTER — Telehealth: Payer: Self-pay | Admitting: Cardiology

## 2020-01-31 DIAGNOSIS — M5440 Lumbago with sciatica, unspecified side: Secondary | ICD-10-CM | POA: Diagnosis not present

## 2020-01-31 DIAGNOSIS — E1142 Type 2 diabetes mellitus with diabetic polyneuropathy: Secondary | ICD-10-CM | POA: Diagnosis not present

## 2020-01-31 DIAGNOSIS — M159 Polyosteoarthritis, unspecified: Secondary | ICD-10-CM | POA: Diagnosis not present

## 2020-01-31 DIAGNOSIS — Z87891 Personal history of nicotine dependence: Secondary | ICD-10-CM | POA: Diagnosis not present

## 2020-01-31 DIAGNOSIS — Z7901 Long term (current) use of anticoagulants: Secondary | ICD-10-CM | POA: Diagnosis not present

## 2020-01-31 DIAGNOSIS — E538 Deficiency of other specified B group vitamins: Secondary | ICD-10-CM | POA: Diagnosis not present

## 2020-01-31 DIAGNOSIS — N390 Urinary tract infection, site not specified: Secondary | ICD-10-CM | POA: Diagnosis not present

## 2020-01-31 DIAGNOSIS — F32A Depression, unspecified: Secondary | ICD-10-CM | POA: Diagnosis not present

## 2020-01-31 DIAGNOSIS — E559 Vitamin D deficiency, unspecified: Secondary | ICD-10-CM | POA: Diagnosis not present

## 2020-01-31 DIAGNOSIS — I480 Paroxysmal atrial fibrillation: Secondary | ICD-10-CM | POA: Diagnosis not present

## 2020-01-31 DIAGNOSIS — K760 Fatty (change of) liver, not elsewhere classified: Secondary | ICD-10-CM | POA: Diagnosis not present

## 2020-01-31 DIAGNOSIS — R296 Repeated falls: Secondary | ICD-10-CM | POA: Diagnosis not present

## 2020-01-31 DIAGNOSIS — E039 Hypothyroidism, unspecified: Secondary | ICD-10-CM | POA: Diagnosis not present

## 2020-01-31 DIAGNOSIS — Z8673 Personal history of transient ischemic attack (TIA), and cerebral infarction without residual deficits: Secondary | ICD-10-CM | POA: Diagnosis not present

## 2020-01-31 DIAGNOSIS — Z9181 History of falling: Secondary | ICD-10-CM | POA: Diagnosis not present

## 2020-01-31 DIAGNOSIS — E782 Mixed hyperlipidemia: Secondary | ICD-10-CM | POA: Diagnosis not present

## 2020-01-31 DIAGNOSIS — Z7902 Long term (current) use of antithrombotics/antiplatelets: Secondary | ICD-10-CM | POA: Diagnosis not present

## 2020-01-31 DIAGNOSIS — K219 Gastro-esophageal reflux disease without esophagitis: Secondary | ICD-10-CM | POA: Diagnosis not present

## 2020-01-31 DIAGNOSIS — E1151 Type 2 diabetes mellitus with diabetic peripheral angiopathy without gangrene: Secondary | ICD-10-CM | POA: Diagnosis not present

## 2020-01-31 DIAGNOSIS — E871 Hypo-osmolality and hyponatremia: Secondary | ICD-10-CM | POA: Diagnosis not present

## 2020-01-31 DIAGNOSIS — J45909 Unspecified asthma, uncomplicated: Secondary | ICD-10-CM | POA: Diagnosis not present

## 2020-01-31 DIAGNOSIS — I10 Essential (primary) hypertension: Secondary | ICD-10-CM | POA: Diagnosis not present

## 2020-01-31 DIAGNOSIS — Z7984 Long term (current) use of oral hypoglycemic drugs: Secondary | ICD-10-CM | POA: Diagnosis not present

## 2020-01-31 NOTE — Telephone Encounter (Signed)
Pt c/o medication issue:  1. Name of Medication:  carvedilol (COREG) 3.125 MG tablet potassium chloride (KLOR-CON) 10 MEQ tablet  rivaroxaban (XARELTO) 20 MG TABS tablet  clopidogrel (PLAVIX) 75 MG tablet 2. How are you currently taking this medication (dosage and times per day)? Taking all medications as prescribed   3. Are you having a reaction (difficulty breathing--STAT)? No   4. What is your medication issue? Elaine Mathews is calling stating Elaine Mathews's PCP does not have Carvedilol and Potassium listed on her current medication list. Due to this she is requesting confirmation that Dr. Tomie China is currently wanting her on both of these medications. She is also wanting to know if Blenda should be taking her Xarelto and Plavix at the same time, due to never seeing a patient on both. Elaine Mathews also mentioned that the her PCP has Furosemide(Lasix) listed on her current medication list to be taking as needed for swelling. Please advise.

## 2020-01-31 NOTE — Telephone Encounter (Signed)
Medication list reviewed with Lawson Fiscal, Charity fundraiser. No additional questions.

## 2020-02-02 DIAGNOSIS — R300 Dysuria: Secondary | ICD-10-CM | POA: Diagnosis not present

## 2020-02-02 DIAGNOSIS — F32A Depression, unspecified: Secondary | ICD-10-CM | POA: Diagnosis not present

## 2020-02-02 DIAGNOSIS — E1142 Type 2 diabetes mellitus with diabetic polyneuropathy: Secondary | ICD-10-CM | POA: Diagnosis not present

## 2020-02-02 DIAGNOSIS — Z87891 Personal history of nicotine dependence: Secondary | ICD-10-CM | POA: Diagnosis not present

## 2020-02-02 DIAGNOSIS — Z7984 Long term (current) use of oral hypoglycemic drugs: Secondary | ICD-10-CM | POA: Diagnosis not present

## 2020-02-02 DIAGNOSIS — I480 Paroxysmal atrial fibrillation: Secondary | ICD-10-CM | POA: Diagnosis not present

## 2020-02-02 DIAGNOSIS — E559 Vitamin D deficiency, unspecified: Secondary | ICD-10-CM | POA: Diagnosis not present

## 2020-02-02 DIAGNOSIS — K219 Gastro-esophageal reflux disease without esophagitis: Secondary | ICD-10-CM | POA: Diagnosis not present

## 2020-02-02 DIAGNOSIS — N390 Urinary tract infection, site not specified: Secondary | ICD-10-CM | POA: Diagnosis not present

## 2020-02-02 DIAGNOSIS — E538 Deficiency of other specified B group vitamins: Secondary | ICD-10-CM | POA: Diagnosis not present

## 2020-02-02 DIAGNOSIS — R296 Repeated falls: Secondary | ICD-10-CM | POA: Diagnosis not present

## 2020-02-02 DIAGNOSIS — Z8673 Personal history of transient ischemic attack (TIA), and cerebral infarction without residual deficits: Secondary | ICD-10-CM | POA: Diagnosis not present

## 2020-02-02 DIAGNOSIS — Z7902 Long term (current) use of antithrombotics/antiplatelets: Secondary | ICD-10-CM | POA: Diagnosis not present

## 2020-02-02 DIAGNOSIS — E1151 Type 2 diabetes mellitus with diabetic peripheral angiopathy without gangrene: Secondary | ICD-10-CM | POA: Diagnosis not present

## 2020-02-02 DIAGNOSIS — E782 Mixed hyperlipidemia: Secondary | ICD-10-CM | POA: Diagnosis not present

## 2020-02-02 DIAGNOSIS — M159 Polyosteoarthritis, unspecified: Secondary | ICD-10-CM | POA: Diagnosis not present

## 2020-02-02 DIAGNOSIS — E871 Hypo-osmolality and hyponatremia: Secondary | ICD-10-CM | POA: Diagnosis not present

## 2020-02-02 DIAGNOSIS — E039 Hypothyroidism, unspecified: Secondary | ICD-10-CM | POA: Diagnosis not present

## 2020-02-02 DIAGNOSIS — R410 Disorientation, unspecified: Secondary | ICD-10-CM | POA: Diagnosis not present

## 2020-02-02 DIAGNOSIS — J45909 Unspecified asthma, uncomplicated: Secondary | ICD-10-CM | POA: Diagnosis not present

## 2020-02-02 DIAGNOSIS — I10 Essential (primary) hypertension: Secondary | ICD-10-CM | POA: Diagnosis not present

## 2020-02-02 DIAGNOSIS — Z7901 Long term (current) use of anticoagulants: Secondary | ICD-10-CM | POA: Diagnosis not present

## 2020-02-02 DIAGNOSIS — K760 Fatty (change of) liver, not elsewhere classified: Secondary | ICD-10-CM | POA: Diagnosis not present

## 2020-02-02 DIAGNOSIS — Z9181 History of falling: Secondary | ICD-10-CM | POA: Diagnosis not present

## 2020-02-02 DIAGNOSIS — M5440 Lumbago with sciatica, unspecified side: Secondary | ICD-10-CM | POA: Diagnosis not present

## 2020-02-03 DIAGNOSIS — M5440 Lumbago with sciatica, unspecified side: Secondary | ICD-10-CM | POA: Diagnosis not present

## 2020-02-08 DIAGNOSIS — N390 Urinary tract infection, site not specified: Secondary | ICD-10-CM | POA: Diagnosis not present

## 2020-02-08 DIAGNOSIS — J45909 Unspecified asthma, uncomplicated: Secondary | ICD-10-CM | POA: Diagnosis not present

## 2020-02-08 DIAGNOSIS — E538 Deficiency of other specified B group vitamins: Secondary | ICD-10-CM | POA: Diagnosis not present

## 2020-02-08 DIAGNOSIS — E559 Vitamin D deficiency, unspecified: Secondary | ICD-10-CM | POA: Diagnosis not present

## 2020-02-08 DIAGNOSIS — F32A Depression, unspecified: Secondary | ICD-10-CM | POA: Diagnosis not present

## 2020-02-08 DIAGNOSIS — Z8673 Personal history of transient ischemic attack (TIA), and cerebral infarction without residual deficits: Secondary | ICD-10-CM | POA: Diagnosis not present

## 2020-02-08 DIAGNOSIS — I480 Paroxysmal atrial fibrillation: Secondary | ICD-10-CM | POA: Diagnosis not present

## 2020-02-08 DIAGNOSIS — M159 Polyosteoarthritis, unspecified: Secondary | ICD-10-CM | POA: Diagnosis not present

## 2020-02-08 DIAGNOSIS — E1142 Type 2 diabetes mellitus with diabetic polyneuropathy: Secondary | ICD-10-CM | POA: Diagnosis not present

## 2020-02-08 DIAGNOSIS — K760 Fatty (change of) liver, not elsewhere classified: Secondary | ICD-10-CM | POA: Diagnosis not present

## 2020-02-08 DIAGNOSIS — Z87891 Personal history of nicotine dependence: Secondary | ICD-10-CM | POA: Diagnosis not present

## 2020-02-08 DIAGNOSIS — E1151 Type 2 diabetes mellitus with diabetic peripheral angiopathy without gangrene: Secondary | ICD-10-CM | POA: Diagnosis not present

## 2020-02-08 DIAGNOSIS — Z7902 Long term (current) use of antithrombotics/antiplatelets: Secondary | ICD-10-CM | POA: Diagnosis not present

## 2020-02-08 DIAGNOSIS — Z7901 Long term (current) use of anticoagulants: Secondary | ICD-10-CM | POA: Diagnosis not present

## 2020-02-08 DIAGNOSIS — Z7984 Long term (current) use of oral hypoglycemic drugs: Secondary | ICD-10-CM | POA: Diagnosis not present

## 2020-02-08 DIAGNOSIS — K219 Gastro-esophageal reflux disease without esophagitis: Secondary | ICD-10-CM | POA: Diagnosis not present

## 2020-02-08 DIAGNOSIS — M5440 Lumbago with sciatica, unspecified side: Secondary | ICD-10-CM | POA: Diagnosis not present

## 2020-02-08 DIAGNOSIS — E782 Mixed hyperlipidemia: Secondary | ICD-10-CM | POA: Diagnosis not present

## 2020-02-08 DIAGNOSIS — R296 Repeated falls: Secondary | ICD-10-CM | POA: Diagnosis not present

## 2020-02-08 DIAGNOSIS — E039 Hypothyroidism, unspecified: Secondary | ICD-10-CM | POA: Diagnosis not present

## 2020-02-08 DIAGNOSIS — Z9181 History of falling: Secondary | ICD-10-CM | POA: Diagnosis not present

## 2020-02-08 DIAGNOSIS — I10 Essential (primary) hypertension: Secondary | ICD-10-CM | POA: Diagnosis not present

## 2020-02-08 DIAGNOSIS — E871 Hypo-osmolality and hyponatremia: Secondary | ICD-10-CM | POA: Diagnosis not present

## 2020-02-09 ENCOUNTER — Ambulatory Visit (INDEPENDENT_AMBULATORY_CARE_PROVIDER_SITE_OTHER): Payer: Medicare Other | Admitting: Cardiology

## 2020-02-09 ENCOUNTER — Encounter: Payer: Self-pay | Admitting: Cardiology

## 2020-02-09 ENCOUNTER — Other Ambulatory Visit: Payer: Self-pay

## 2020-02-09 VITALS — BP 124/80 | HR 80 | Ht 64.0 in | Wt 121.2 lb

## 2020-02-09 DIAGNOSIS — I10 Essential (primary) hypertension: Secondary | ICD-10-CM

## 2020-02-09 DIAGNOSIS — I480 Paroxysmal atrial fibrillation: Secondary | ICD-10-CM

## 2020-02-09 DIAGNOSIS — E1169 Type 2 diabetes mellitus with other specified complication: Secondary | ICD-10-CM | POA: Diagnosis not present

## 2020-02-09 DIAGNOSIS — E785 Hyperlipidemia, unspecified: Secondary | ICD-10-CM

## 2020-02-09 DIAGNOSIS — I2510 Atherosclerotic heart disease of native coronary artery without angina pectoris: Secondary | ICD-10-CM | POA: Diagnosis not present

## 2020-02-09 DIAGNOSIS — E088 Diabetes mellitus due to underlying condition with unspecified complications: Secondary | ICD-10-CM

## 2020-02-09 NOTE — Progress Notes (Signed)
Cardiology Office Note:    Date:  02/09/2020   ID:  Elaine Mathews, DOB Apr 17, 1933, MRN [id number]  PCP:  Lucianne Lei, MD  Cardiologist:  Garwin Brothers, MD   Referring MD: Lucianne Lei, MD    ASSESSMENT:    1. Paroxysmal atrial fibrillation (HCC)   2. Type 2 diabetes mellitus with hyperlipidemia (HCC)   3. Essential hypertension   4. Coronary artery disease involving native coronary artery of native heart without angina pectoris   5. Diabetes mellitus due to underlying condition with unspecified complications (HCC)    PLAN:    In order of problems listed above:  1. I reviewed records from [facility] extensively and questions were answered to her satisfaction. 2. Coronary artery disease stable at this time.  Medical management.  I reviewed medications that were changed at the hospital. 3. Paroxysmal atrial fibrillation:I discussed with the patient atrial fibrillation, disease process. Management and therapy including rate and rhythm control, anticoagulation benefits and potential risks were discussed extensively with the patient. Patient had multiple questions which were answered to patient's satisfaction. 4. Essential hypertension: Blood pressure stable.  Diet and lifestyle modification was emphasized. 5. Mixed dyslipidemia: On statin therapy and doing well.  Labs reviewed from [facility] sheet. 6. Patient will be seen in follow-up appointment in 6 months or earlier if the patient has any concerns    Medication Adjustments/Labs and Tests Ordered: Current medicines are reviewed at length with the patient today.  Concerns regarding medicines are outlined above.  No orders of the defined types were placed in this encounter.  No orders of the defined types were placed in this encounter.    No chief complaint on file.    History of Present Illness:    Elaine Mathews is a 86 y.o. female.  Patient has past medical history of paroxysmal atrial fibrillation, coronary artery  disease, essential hypertension diabetes mellitus and some dementia.  Her family member is very supportive.  She was recently admitted to the hospital.  She was evaluated for cardiac evaluation and discharged.  Subsequently she is done fine.  She takes care of activities of daily living.  She ambulates with a walker.  No chest pain orthopnea or PND.  Past Medical History:  Diagnosis Date  . A-fib (HCC)   . Abrasion, knee, left, initial encounter 10/28/2019  . AF (paroxysmal atrial fibrillation) (HCC) 10/28/2019  . Allergic rhinosinusitis   . Arthritis   . Asthma   . Ataxia    With generalized weakness in legs  . CAD (coronary artery disease)   . CAD in native artery 10/28/2019  . Cataract, bilateral   . CHF (congestive heart failure) (HCC)   . Chronic diastolic CHF (congestive heart failure) (HCC) 10/28/2019  . Controlled type 2 diabetes with neuropathy (HCC) 01/06/2017  . Coronary artery disease   . Dementia without behavioral disturbance (HCC) 10/28/2019  . Diabetes mellitus due to underlying condition with unspecified complications (HCC) 11/17/2019  . Diabetes mellitus without complication (HCC)   . Diarrhea 10/28/2019  . DM2 (diabetes mellitus, type 2) (HCC) 10/28/2019  . Dysrhythmia   . Elevated LFTs 07/12/2018  . Elevated liver enzymes   . Essential hypertension 11/13/2017  . Fatty liver   . Foot lesion   . History of stroke 10/28/2019  . Hyperlipidemia   . Hypertension   . Hypothyroidism   . Leg pain, bilateral   . MI (myocardial infarction) (HCC)   . Mild asthma   . Mild vascular  neurocognitive disorder (HCC) 11/13/2018  . Nail dystrophy 03/08/2019  . Neuromuscular disorder (HCC)   . NSTEMI (non-ST elevated myocardial infarction) (HCC) 07/12/2018  . Paroxysmal atrial fibrillation (HCC) 11/13/2017  . Peripheral vascular disease (HCC)   . Pre-ulcerative calluses 01/06/2017  . Prolonged QT interval 10/28/2019  . PVD (peripheral vascular disease) (HCC)   . Seizure (HCC)   .  Seizure disorder (HCC) 10/28/2019  . Sinus pause 07/13/2018  . Spleen hematoma without rupture of capsule, without open wound into cavity 07/15/2018  . Stroke [facility])    Right parietal  . Stroke (HCC)   . Syncope and collapse 10/28/2019  . Syncope, vasovagal 07/15/2018  . Thyroid disease   . Type 2 diabetes mellitus with hyperlipidemia (HCC) 10/28/2019  . Vitamin D deficiency     Past Surgical History:  Procedure Laterality Date  . APPENDECTOMY    . CATARACT EXTRACTION Bilateral   . CHOLECYSTECTOMY    . CORONARY ANGIOPLASTY    . CORONARY STENT INTERVENTION N/A 07/13/2018   Procedure: CORONARY STENT INTERVENTION;  Surgeon: Yvonne Kendall, MD;  [facility];  Service: Cardiovascular;  Laterality: N/A;  . INTRAVASCULAR PRESSURE WIRE/FFR STUDY N/A 07/13/2018   Procedure: INTRAVASCULAR PRESSURE WIRE/FFR STUDY;  Surgeon: Yvonne Kendall, MD;  Location: [facility] INVASIVE CV LAB;  Service: Cardiovascular;  Laterality: N/A;  . LEFT HEART CATH AND CORONARY ANGIOGRAPHY N/A 07/13/2018   Procedure: LEFT HEART CATH AND CORONARY ANGIOGRAPHY;  Surgeon: Yvonne Kendall, MD;  Location: [facility] CV LAB;  Service: Cardiovascular;  Laterality: N/A;  . TONSILLECTOMY      Current Medications: Current Meds  Medication Sig  . atorvastatin (LIPITOR) 40 MG tablet Take 1 tablet (40 mg total) by mouth daily at 6 PM.  . carvedilol (COREG) 3.125 MG tablet Take 1 tablet (3.125 mg total) by mouth 2 (two) times daily with a meal.  . clopidogrel (PLAVIX) 75 MG tablet Take 1 tablet (75 mg total) by mouth daily with breakfast.  . ezetimibe (ZETIA) 10 MG tablet Take 10 mg by mouth every evening.   . famotidine (PEPCID) 40 MG tablet Take 40 mg by mouth daily.  Marland Kitchen levETIRAcetam (KEPPRA) 250 MG tablet TAKE 1 TABLET BY MOUTH EVERY MORNING ANDTAKE 2 TABLETS EVERY EVENING (Patient taking differently: Take 250 mg by mouth 2 (two) times daily. 250-500)  . levothyroxine (SYNTHROID) 75 MCG tablet Take 75 mcg by mouth daily  before breakfast.  . memantine (NAMENDA) 10 MG tablet Take 10 mg by mouth 2 (two) times daily.  . metFORMIN (GLUCOPHAGE) 500 MG tablet Take 250 mg by mouth 2 (two) times daily.   . Misc Natural Products (GLUCOSAMINE CHONDROITIN TRIPLE) TABS Take 2 tablets by mouth every other day.   . montelukast (SINGULAIR) 10 MG tablet Take 10 mg by mouth every evening.   . nitroGLYCERIN (NITROSTAT) 0.4 MG SL tablet Place 1 tablet (0.4 mg total) under the tongue every 5 (five) minutes as needed for chest pain.  . polyvinyl alcohol (LIQUIFILM TEARS) 1.4 % ophthalmic solution Place 1 drop into both eyes as needed for dry eyes.  . potassium chloride (KLOR-CON) 10 MEQ tablet Take 1 tablet (10 mEq total) by mouth daily.  . Prenatal Vit-Fe Fumarate-FA (PRENATAL VITAMIN PO) Take 1 tablet by mouth daily.  . Probiotic Product [facility]) CAPS Take 1 capsule by mouth daily.  . rivaroxaban (XARELTO) 20 MG TABS tablet Take 1 tablet (20 mg total) by mouth daily with supper.  . sertraline (ZOLOFT) 50 MG tablet Take 50 mg by  mouth daily.  . sodium chloride 1 g tablet Take 1 g by mouth daily.  . Vitamin D, Ergocalciferol, (DRISDOL) 1.25 MG (50000 UNIT) CAPS capsule Take 50,000 Units by mouth every Wednesday.     Allergies:   Other, Codeine, and Penicillins   Social History   Socioeconomic History  . Marital status: Divorced    Spouse name: Not on file  . Number of children: Not on file  . Years of education: Not on file  . Highest education level: Not on file  Occupational History  . Not on file  Tobacco Use  . Smoking status: Never Smoker  . Smokeless tobacco: Never Used  Vaping Use  . Vaping Use: Never used  Substance and Sexual Activity  . Alcohol use: Not Currently  . Drug use: No  . Sexual activity: Not Currently    Partners: Male  Other Topics Concern  . Not on file  Social History Narrative   ** Merged History Encounter **       Lives with  Highest level of edu-  Right handed    Social  Determinants of Health   Financial Resource Strain: Not on file  Food Insecurity: Not on file  Transportation Needs: Not on file  Physical Activity: Not on file  Stress: Not on file  Social Connections: Not on file     Family History: The patient's family history includes CAD in her father and another family member; COPD in her son; Diabetes in her mother; Heart attack in her mother and son; Hypercholesterolemia in her mother; Hypertension in her father and mother; Stroke in her father and mother.  ROS:   Please see the history of present illness.    All other systems reviewed and are negative.  EKGs/Labs/Other Studies Reviewed:    The following studies were reviewed today: CORONARY STENT INTERVENTION  INTRAVASCULAR PRESSURE WIRE/FFR STUDY  LEFT HEART CATH AND CORONARY ANGIOGRAPHY    Conclusion  Conclusions: 1. Two vessel coronary artery disease with multifocal LAD (40% proximal, 60-70% mid, and 80% apical stenoses). Proximal and mid LAD disease is highly significant by DFR (0.74). Culpril lesion for NSTEMI is likely occluded small OM2 branch, which is too small for PCI. 2. Normal left ventricular systolic function with mildly elevated filling pressure (LVEDP 15-20 mmHg). 3. Successful DFR-guided PCI to the proximal/mid LAD using a Resolute Onyx 2.75 x 18 mm drug-eluting stent with 0% residual stenosis and TIMI-3 flow.  Recommendations 1. Medical therapy for occluded OM2 branch and apical LAD disease. 2. Restart heparin 2 hours after TR band removal, given history of paroxysmal atrial fibrillation. If no bleeding complications, recommend discontinuation of aspirin tomorrow and initiation of rivaroxaban 20 mg daily. Rivaroxaban and clopidogrel should be continued for at least 12 months, if possible. 3. Aggressive secondary prevention.  Yvonne Kendall, MD [facility] Pager: [phone]  EVENT MONITOR REPORT:   Patient was monitored from 11/17/2019 to  12/16/2019. Indication:                    Syncope and collapse Ordering physician:  Garwin Brothers, MD  Referring physician:        Garwin Brothers, MD    Baseline rhythm: Sinus  Minimum heart rate: 60 BPM.   Maximal heart rate 129 PM.  Atrial arrhythmia: Rare PACs.  2 episodes of very brief paroxysms of atrial fibrillation.  Ventricular arrhythmia: None significant rare PVCs  Conduction abnormality: None significant  Symptoms: None significant   Conclusion:  2 episodes of very brief paroxysms of atrial fibrillation.  Interpreting  cardiologist: Garwin Brothers, MD  Date: 12/29/2019 12:36 PM     Recent Labs: 10/28/2019: B Natriuretic Peptide 114.6 10/29/2019: ALT 26; Magnesium 1.6 11/17/2019: TSH 0.881 01/21/2020: BUN 10; Creatinine, Ser 0.59; Hemoglobin 10.2; Platelets 123; Potassium 3.6; Sodium 131  Recent Lipid Panel    Component Value Date/Time   CHOL 137 11/17/2019 1648   TRIG 139 11/17/2019 1648   HDL 48 11/17/2019 1648   CHOLHDL 2.9 11/17/2019 1648   CHOLHDL 3.7 07/13/2018 0234   VLDL 23 07/13/2018 0234   LDLCALC 65 11/17/2019 1648    Physical Exam:    VS:  BP 124/80   Pulse 80   Ht 5\' 4"  (1.626 m)   Wt 121 lb 3.2 oz (55 kg)   SpO2 95%   BMI 20.80 kg/m     Wt Readings from Last 3 Encounters:  02/09/20 121 lb 3.2 oz (55 kg)  01/21/20 119 lb 6.4 oz (54.2 kg)  01/11/20 122 lb 3.2 oz (55.4 kg)     GEN: Patient is in no acute distress HEENT: Normal NECK: No JVD; No carotid bruits LYMPHATICS: No lymphadenopathy CARDIAC: Hear sounds regular, 2/6 systolic murmur at the apex. RESPIRATORY:  Clear to auscultation without rales, wheezing or rhonchi  ABDOMEN: Soft, non-tender, non-distended MUSCULOSKELETAL:  No edema; No deformity  SKIN: Warm and dry NEUROLOGIC:  Alert and oriented x 3 PSYCHIATRIC:  Normal affect   Signed, 01/13/20, MD  02/09/2020 12:53 PM    [facility]

## 2020-02-09 NOTE — Patient Instructions (Signed)

## 2020-02-10 DIAGNOSIS — F32A Depression, unspecified: Secondary | ICD-10-CM | POA: Diagnosis not present

## 2020-02-10 DIAGNOSIS — K760 Fatty (change of) liver, not elsewhere classified: Secondary | ICD-10-CM | POA: Diagnosis not present

## 2020-02-10 DIAGNOSIS — M159 Polyosteoarthritis, unspecified: Secondary | ICD-10-CM | POA: Diagnosis not present

## 2020-02-10 DIAGNOSIS — Z9181 History of falling: Secondary | ICD-10-CM | POA: Diagnosis not present

## 2020-02-10 DIAGNOSIS — E559 Vitamin D deficiency, unspecified: Secondary | ICD-10-CM | POA: Diagnosis not present

## 2020-02-10 DIAGNOSIS — Z7984 Long term (current) use of oral hypoglycemic drugs: Secondary | ICD-10-CM | POA: Diagnosis not present

## 2020-02-10 DIAGNOSIS — Z7901 Long term (current) use of anticoagulants: Secondary | ICD-10-CM | POA: Diagnosis not present

## 2020-02-10 DIAGNOSIS — E871 Hypo-osmolality and hyponatremia: Secondary | ICD-10-CM | POA: Diagnosis not present

## 2020-02-10 DIAGNOSIS — I10 Essential (primary) hypertension: Secondary | ICD-10-CM | POA: Diagnosis not present

## 2020-02-10 DIAGNOSIS — Z7902 Long term (current) use of antithrombotics/antiplatelets: Secondary | ICD-10-CM | POA: Diagnosis not present

## 2020-02-10 DIAGNOSIS — Z87891 Personal history of nicotine dependence: Secondary | ICD-10-CM | POA: Diagnosis not present

## 2020-02-10 DIAGNOSIS — E538 Deficiency of other specified B group vitamins: Secondary | ICD-10-CM | POA: Diagnosis not present

## 2020-02-10 DIAGNOSIS — N390 Urinary tract infection, site not specified: Secondary | ICD-10-CM | POA: Diagnosis not present

## 2020-02-10 DIAGNOSIS — M5440 Lumbago with sciatica, unspecified side: Secondary | ICD-10-CM | POA: Diagnosis not present

## 2020-02-10 DIAGNOSIS — J45909 Unspecified asthma, uncomplicated: Secondary | ICD-10-CM | POA: Diagnosis not present

## 2020-02-10 DIAGNOSIS — Z8673 Personal history of transient ischemic attack (TIA), and cerebral infarction without residual deficits: Secondary | ICD-10-CM | POA: Diagnosis not present

## 2020-02-10 DIAGNOSIS — R296 Repeated falls: Secondary | ICD-10-CM | POA: Diagnosis not present

## 2020-02-10 DIAGNOSIS — I480 Paroxysmal atrial fibrillation: Secondary | ICD-10-CM | POA: Diagnosis not present

## 2020-02-10 DIAGNOSIS — E039 Hypothyroidism, unspecified: Secondary | ICD-10-CM | POA: Diagnosis not present

## 2020-02-10 DIAGNOSIS — E782 Mixed hyperlipidemia: Secondary | ICD-10-CM | POA: Diagnosis not present

## 2020-02-10 DIAGNOSIS — E1151 Type 2 diabetes mellitus with diabetic peripheral angiopathy without gangrene: Secondary | ICD-10-CM | POA: Diagnosis not present

## 2020-02-10 DIAGNOSIS — E1142 Type 2 diabetes mellitus with diabetic polyneuropathy: Secondary | ICD-10-CM | POA: Diagnosis not present

## 2020-02-10 DIAGNOSIS — K219 Gastro-esophageal reflux disease without esophagitis: Secondary | ICD-10-CM | POA: Diagnosis not present

## 2020-02-14 DIAGNOSIS — E1151 Type 2 diabetes mellitus with diabetic peripheral angiopathy without gangrene: Secondary | ICD-10-CM | POA: Diagnosis not present

## 2020-02-14 DIAGNOSIS — Z7984 Long term (current) use of oral hypoglycemic drugs: Secondary | ICD-10-CM | POA: Diagnosis not present

## 2020-02-14 DIAGNOSIS — E559 Vitamin D deficiency, unspecified: Secondary | ICD-10-CM | POA: Diagnosis not present

## 2020-02-14 DIAGNOSIS — K219 Gastro-esophageal reflux disease without esophagitis: Secondary | ICD-10-CM | POA: Diagnosis not present

## 2020-02-14 DIAGNOSIS — J45909 Unspecified asthma, uncomplicated: Secondary | ICD-10-CM | POA: Diagnosis not present

## 2020-02-14 DIAGNOSIS — M5440 Lumbago with sciatica, unspecified side: Secondary | ICD-10-CM | POA: Diagnosis not present

## 2020-02-14 DIAGNOSIS — Z7902 Long term (current) use of antithrombotics/antiplatelets: Secondary | ICD-10-CM | POA: Diagnosis not present

## 2020-02-14 DIAGNOSIS — E538 Deficiency of other specified B group vitamins: Secondary | ICD-10-CM | POA: Diagnosis not present

## 2020-02-14 DIAGNOSIS — E039 Hypothyroidism, unspecified: Secondary | ICD-10-CM | POA: Diagnosis not present

## 2020-02-14 DIAGNOSIS — I480 Paroxysmal atrial fibrillation: Secondary | ICD-10-CM | POA: Diagnosis not present

## 2020-02-14 DIAGNOSIS — M159 Polyosteoarthritis, unspecified: Secondary | ICD-10-CM | POA: Diagnosis not present

## 2020-02-14 DIAGNOSIS — Z9181 History of falling: Secondary | ICD-10-CM | POA: Diagnosis not present

## 2020-02-14 DIAGNOSIS — F32A Depression, unspecified: Secondary | ICD-10-CM | POA: Diagnosis not present

## 2020-02-14 DIAGNOSIS — Z7901 Long term (current) use of anticoagulants: Secondary | ICD-10-CM | POA: Diagnosis not present

## 2020-02-14 DIAGNOSIS — E1142 Type 2 diabetes mellitus with diabetic polyneuropathy: Secondary | ICD-10-CM | POA: Diagnosis not present

## 2020-02-14 DIAGNOSIS — E782 Mixed hyperlipidemia: Secondary | ICD-10-CM | POA: Diagnosis not present

## 2020-02-14 DIAGNOSIS — E871 Hypo-osmolality and hyponatremia: Secondary | ICD-10-CM | POA: Diagnosis not present

## 2020-02-14 DIAGNOSIS — I10 Essential (primary) hypertension: Secondary | ICD-10-CM | POA: Diagnosis not present

## 2020-02-14 DIAGNOSIS — R296 Repeated falls: Secondary | ICD-10-CM | POA: Diagnosis not present

## 2020-02-14 DIAGNOSIS — Z87891 Personal history of nicotine dependence: Secondary | ICD-10-CM | POA: Diagnosis not present

## 2020-02-14 DIAGNOSIS — K760 Fatty (change of) liver, not elsewhere classified: Secondary | ICD-10-CM | POA: Diagnosis not present

## 2020-02-14 DIAGNOSIS — Z8673 Personal history of transient ischemic attack (TIA), and cerebral infarction without residual deficits: Secondary | ICD-10-CM | POA: Diagnosis not present

## 2020-02-14 DIAGNOSIS — N390 Urinary tract infection, site not specified: Secondary | ICD-10-CM | POA: Diagnosis not present

## 2020-02-15 DIAGNOSIS — I739 Peripheral vascular disease, unspecified: Secondary | ICD-10-CM | POA: Diagnosis not present

## 2020-02-15 DIAGNOSIS — E1140 Type 2 diabetes mellitus with diabetic neuropathy, unspecified: Secondary | ICD-10-CM | POA: Diagnosis not present

## 2020-02-15 DIAGNOSIS — B351 Tinea unguium: Secondary | ICD-10-CM | POA: Diagnosis not present

## 2020-02-15 DIAGNOSIS — E119 Type 2 diabetes mellitus without complications: Secondary | ICD-10-CM | POA: Insufficient documentation

## 2020-02-15 HISTORY — DX: Type 2 diabetes mellitus without complications: E11.9

## 2020-02-15 HISTORY — DX: Tinea unguium: B35.1

## 2020-02-17 DIAGNOSIS — E782 Mixed hyperlipidemia: Secondary | ICD-10-CM | POA: Diagnosis not present

## 2020-02-17 DIAGNOSIS — Z7902 Long term (current) use of antithrombotics/antiplatelets: Secondary | ICD-10-CM | POA: Diagnosis not present

## 2020-02-17 DIAGNOSIS — E871 Hypo-osmolality and hyponatremia: Secondary | ICD-10-CM | POA: Diagnosis not present

## 2020-02-17 DIAGNOSIS — F32A Depression, unspecified: Secondary | ICD-10-CM | POA: Diagnosis not present

## 2020-02-17 DIAGNOSIS — K219 Gastro-esophageal reflux disease without esophagitis: Secondary | ICD-10-CM | POA: Diagnosis not present

## 2020-02-17 DIAGNOSIS — R296 Repeated falls: Secondary | ICD-10-CM | POA: Diagnosis not present

## 2020-02-17 DIAGNOSIS — Z7901 Long term (current) use of anticoagulants: Secondary | ICD-10-CM | POA: Diagnosis not present

## 2020-02-17 DIAGNOSIS — E538 Deficiency of other specified B group vitamins: Secondary | ICD-10-CM | POA: Diagnosis not present

## 2020-02-17 DIAGNOSIS — M159 Polyosteoarthritis, unspecified: Secondary | ICD-10-CM | POA: Diagnosis not present

## 2020-02-17 DIAGNOSIS — I10 Essential (primary) hypertension: Secondary | ICD-10-CM | POA: Diagnosis not present

## 2020-02-17 DIAGNOSIS — E1151 Type 2 diabetes mellitus with diabetic peripheral angiopathy without gangrene: Secondary | ICD-10-CM | POA: Diagnosis not present

## 2020-02-17 DIAGNOSIS — Z9181 History of falling: Secondary | ICD-10-CM | POA: Diagnosis not present

## 2020-02-17 DIAGNOSIS — N390 Urinary tract infection, site not specified: Secondary | ICD-10-CM | POA: Diagnosis not present

## 2020-02-17 DIAGNOSIS — E039 Hypothyroidism, unspecified: Secondary | ICD-10-CM | POA: Diagnosis not present

## 2020-02-17 DIAGNOSIS — Z7984 Long term (current) use of oral hypoglycemic drugs: Secondary | ICD-10-CM | POA: Diagnosis not present

## 2020-02-17 DIAGNOSIS — Z87891 Personal history of nicotine dependence: Secondary | ICD-10-CM | POA: Diagnosis not present

## 2020-02-17 DIAGNOSIS — J45909 Unspecified asthma, uncomplicated: Secondary | ICD-10-CM | POA: Diagnosis not present

## 2020-02-17 DIAGNOSIS — M5440 Lumbago with sciatica, unspecified side: Secondary | ICD-10-CM | POA: Diagnosis not present

## 2020-02-17 DIAGNOSIS — Z8673 Personal history of transient ischemic attack (TIA), and cerebral infarction without residual deficits: Secondary | ICD-10-CM | POA: Diagnosis not present

## 2020-02-17 DIAGNOSIS — E1142 Type 2 diabetes mellitus with diabetic polyneuropathy: Secondary | ICD-10-CM | POA: Diagnosis not present

## 2020-02-17 DIAGNOSIS — I480 Paroxysmal atrial fibrillation: Secondary | ICD-10-CM | POA: Diagnosis not present

## 2020-02-17 DIAGNOSIS — K760 Fatty (change of) liver, not elsewhere classified: Secondary | ICD-10-CM | POA: Diagnosis not present

## 2020-02-17 DIAGNOSIS — E559 Vitamin D deficiency, unspecified: Secondary | ICD-10-CM | POA: Diagnosis not present

## 2020-02-22 ENCOUNTER — Other Ambulatory Visit: Payer: Self-pay | Admitting: Cardiology

## 2020-02-23 DIAGNOSIS — I10 Essential (primary) hypertension: Secondary | ICD-10-CM | POA: Diagnosis not present

## 2020-02-23 DIAGNOSIS — J45909 Unspecified asthma, uncomplicated: Secondary | ICD-10-CM | POA: Diagnosis not present

## 2020-02-23 DIAGNOSIS — E1142 Type 2 diabetes mellitus with diabetic polyneuropathy: Secondary | ICD-10-CM | POA: Diagnosis not present

## 2020-02-23 DIAGNOSIS — Z87891 Personal history of nicotine dependence: Secondary | ICD-10-CM | POA: Diagnosis not present

## 2020-02-23 DIAGNOSIS — Z7984 Long term (current) use of oral hypoglycemic drugs: Secondary | ICD-10-CM | POA: Diagnosis not present

## 2020-02-23 DIAGNOSIS — Z7901 Long term (current) use of anticoagulants: Secondary | ICD-10-CM | POA: Diagnosis not present

## 2020-02-23 DIAGNOSIS — E559 Vitamin D deficiency, unspecified: Secondary | ICD-10-CM | POA: Diagnosis not present

## 2020-02-23 DIAGNOSIS — N390 Urinary tract infection, site not specified: Secondary | ICD-10-CM | POA: Diagnosis not present

## 2020-02-23 DIAGNOSIS — Z7902 Long term (current) use of antithrombotics/antiplatelets: Secondary | ICD-10-CM | POA: Diagnosis not present

## 2020-02-23 DIAGNOSIS — M5440 Lumbago with sciatica, unspecified side: Secondary | ICD-10-CM | POA: Diagnosis not present

## 2020-02-23 DIAGNOSIS — E538 Deficiency of other specified B group vitamins: Secondary | ICD-10-CM | POA: Diagnosis not present

## 2020-02-23 DIAGNOSIS — E039 Hypothyroidism, unspecified: Secondary | ICD-10-CM | POA: Diagnosis not present

## 2020-02-23 DIAGNOSIS — Z9181 History of falling: Secondary | ICD-10-CM | POA: Diagnosis not present

## 2020-02-23 DIAGNOSIS — E1151 Type 2 diabetes mellitus with diabetic peripheral angiopathy without gangrene: Secondary | ICD-10-CM | POA: Diagnosis not present

## 2020-02-23 DIAGNOSIS — I480 Paroxysmal atrial fibrillation: Secondary | ICD-10-CM | POA: Diagnosis not present

## 2020-02-23 DIAGNOSIS — Z8673 Personal history of transient ischemic attack (TIA), and cerebral infarction without residual deficits: Secondary | ICD-10-CM | POA: Diagnosis not present

## 2020-02-23 DIAGNOSIS — E782 Mixed hyperlipidemia: Secondary | ICD-10-CM | POA: Diagnosis not present

## 2020-02-23 DIAGNOSIS — R296 Repeated falls: Secondary | ICD-10-CM | POA: Diagnosis not present

## 2020-02-23 DIAGNOSIS — K760 Fatty (change of) liver, not elsewhere classified: Secondary | ICD-10-CM | POA: Diagnosis not present

## 2020-02-23 DIAGNOSIS — F32A Depression, unspecified: Secondary | ICD-10-CM | POA: Diagnosis not present

## 2020-02-23 DIAGNOSIS — K219 Gastro-esophageal reflux disease without esophagitis: Secondary | ICD-10-CM | POA: Diagnosis not present

## 2020-02-23 DIAGNOSIS — M159 Polyosteoarthritis, unspecified: Secondary | ICD-10-CM | POA: Diagnosis not present

## 2020-02-23 DIAGNOSIS — E871 Hypo-osmolality and hyponatremia: Secondary | ICD-10-CM | POA: Diagnosis not present

## 2020-03-02 DIAGNOSIS — Z8673 Personal history of transient ischemic attack (TIA), and cerebral infarction without residual deficits: Secondary | ICD-10-CM | POA: Diagnosis not present

## 2020-03-02 DIAGNOSIS — F32A Depression, unspecified: Secondary | ICD-10-CM | POA: Diagnosis not present

## 2020-03-02 DIAGNOSIS — N390 Urinary tract infection, site not specified: Secondary | ICD-10-CM | POA: Diagnosis not present

## 2020-03-02 DIAGNOSIS — E1151 Type 2 diabetes mellitus with diabetic peripheral angiopathy without gangrene: Secondary | ICD-10-CM | POA: Diagnosis not present

## 2020-03-02 DIAGNOSIS — K219 Gastro-esophageal reflux disease without esophagitis: Secondary | ICD-10-CM | POA: Diagnosis not present

## 2020-03-02 DIAGNOSIS — J45909 Unspecified asthma, uncomplicated: Secondary | ICD-10-CM | POA: Diagnosis not present

## 2020-03-02 DIAGNOSIS — Z87891 Personal history of nicotine dependence: Secondary | ICD-10-CM | POA: Diagnosis not present

## 2020-03-02 DIAGNOSIS — E538 Deficiency of other specified B group vitamins: Secondary | ICD-10-CM | POA: Diagnosis not present

## 2020-03-02 DIAGNOSIS — E559 Vitamin D deficiency, unspecified: Secondary | ICD-10-CM | POA: Diagnosis not present

## 2020-03-02 DIAGNOSIS — Z7902 Long term (current) use of antithrombotics/antiplatelets: Secondary | ICD-10-CM | POA: Diagnosis not present

## 2020-03-02 DIAGNOSIS — E871 Hypo-osmolality and hyponatremia: Secondary | ICD-10-CM | POA: Diagnosis not present

## 2020-03-02 DIAGNOSIS — M159 Polyosteoarthritis, unspecified: Secondary | ICD-10-CM | POA: Diagnosis not present

## 2020-03-02 DIAGNOSIS — K760 Fatty (change of) liver, not elsewhere classified: Secondary | ICD-10-CM | POA: Diagnosis not present

## 2020-03-02 DIAGNOSIS — Z7984 Long term (current) use of oral hypoglycemic drugs: Secondary | ICD-10-CM | POA: Diagnosis not present

## 2020-03-02 DIAGNOSIS — I10 Essential (primary) hypertension: Secondary | ICD-10-CM | POA: Diagnosis not present

## 2020-03-02 DIAGNOSIS — Z7901 Long term (current) use of anticoagulants: Secondary | ICD-10-CM | POA: Diagnosis not present

## 2020-03-02 DIAGNOSIS — Z9181 History of falling: Secondary | ICD-10-CM | POA: Diagnosis not present

## 2020-03-02 DIAGNOSIS — E039 Hypothyroidism, unspecified: Secondary | ICD-10-CM | POA: Diagnosis not present

## 2020-03-02 DIAGNOSIS — M5440 Lumbago with sciatica, unspecified side: Secondary | ICD-10-CM | POA: Diagnosis not present

## 2020-03-02 DIAGNOSIS — E782 Mixed hyperlipidemia: Secondary | ICD-10-CM | POA: Diagnosis not present

## 2020-03-02 DIAGNOSIS — R296 Repeated falls: Secondary | ICD-10-CM | POA: Diagnosis not present

## 2020-03-02 DIAGNOSIS — I480 Paroxysmal atrial fibrillation: Secondary | ICD-10-CM | POA: Diagnosis not present

## 2020-03-02 DIAGNOSIS — E1142 Type 2 diabetes mellitus with diabetic polyneuropathy: Secondary | ICD-10-CM | POA: Diagnosis not present

## 2020-03-09 DIAGNOSIS — K219 Gastro-esophageal reflux disease without esophagitis: Secondary | ICD-10-CM | POA: Diagnosis not present

## 2020-03-09 DIAGNOSIS — E782 Mixed hyperlipidemia: Secondary | ICD-10-CM | POA: Diagnosis not present

## 2020-03-09 DIAGNOSIS — E1142 Type 2 diabetes mellitus with diabetic polyneuropathy: Secondary | ICD-10-CM | POA: Diagnosis not present

## 2020-03-09 DIAGNOSIS — E559 Vitamin D deficiency, unspecified: Secondary | ICD-10-CM | POA: Diagnosis not present

## 2020-03-09 DIAGNOSIS — E039 Hypothyroidism, unspecified: Secondary | ICD-10-CM | POA: Diagnosis not present

## 2020-03-09 DIAGNOSIS — I480 Paroxysmal atrial fibrillation: Secondary | ICD-10-CM | POA: Diagnosis not present

## 2020-03-09 DIAGNOSIS — Z8673 Personal history of transient ischemic attack (TIA), and cerebral infarction without residual deficits: Secondary | ICD-10-CM | POA: Diagnosis not present

## 2020-03-09 DIAGNOSIS — Z87891 Personal history of nicotine dependence: Secondary | ICD-10-CM | POA: Diagnosis not present

## 2020-03-09 DIAGNOSIS — Z9181 History of falling: Secondary | ICD-10-CM | POA: Diagnosis not present

## 2020-03-09 DIAGNOSIS — E538 Deficiency of other specified B group vitamins: Secondary | ICD-10-CM | POA: Diagnosis not present

## 2020-03-09 DIAGNOSIS — E1151 Type 2 diabetes mellitus with diabetic peripheral angiopathy without gangrene: Secondary | ICD-10-CM | POA: Diagnosis not present

## 2020-03-09 DIAGNOSIS — M159 Polyosteoarthritis, unspecified: Secondary | ICD-10-CM | POA: Diagnosis not present

## 2020-03-09 DIAGNOSIS — R296 Repeated falls: Secondary | ICD-10-CM | POA: Diagnosis not present

## 2020-03-09 DIAGNOSIS — N390 Urinary tract infection, site not specified: Secondary | ICD-10-CM | POA: Diagnosis not present

## 2020-03-09 DIAGNOSIS — M5440 Lumbago with sciatica, unspecified side: Secondary | ICD-10-CM | POA: Diagnosis not present

## 2020-03-09 DIAGNOSIS — Z7902 Long term (current) use of antithrombotics/antiplatelets: Secondary | ICD-10-CM | POA: Diagnosis not present

## 2020-03-09 DIAGNOSIS — Z7984 Long term (current) use of oral hypoglycemic drugs: Secondary | ICD-10-CM | POA: Diagnosis not present

## 2020-03-09 DIAGNOSIS — Z7901 Long term (current) use of anticoagulants: Secondary | ICD-10-CM | POA: Diagnosis not present

## 2020-03-09 DIAGNOSIS — E871 Hypo-osmolality and hyponatremia: Secondary | ICD-10-CM | POA: Diagnosis not present

## 2020-03-09 DIAGNOSIS — F32A Depression, unspecified: Secondary | ICD-10-CM | POA: Diagnosis not present

## 2020-03-09 DIAGNOSIS — I10 Essential (primary) hypertension: Secondary | ICD-10-CM | POA: Diagnosis not present

## 2020-03-09 DIAGNOSIS — K760 Fatty (change of) liver, not elsewhere classified: Secondary | ICD-10-CM | POA: Diagnosis not present

## 2020-03-09 DIAGNOSIS — J45909 Unspecified asthma, uncomplicated: Secondary | ICD-10-CM | POA: Diagnosis not present

## 2020-03-13 DIAGNOSIS — Z8673 Personal history of transient ischemic attack (TIA), and cerebral infarction without residual deficits: Secondary | ICD-10-CM | POA: Diagnosis not present

## 2020-03-13 DIAGNOSIS — K760 Fatty (change of) liver, not elsewhere classified: Secondary | ICD-10-CM | POA: Diagnosis not present

## 2020-03-13 DIAGNOSIS — M159 Polyosteoarthritis, unspecified: Secondary | ICD-10-CM | POA: Diagnosis not present

## 2020-03-13 DIAGNOSIS — E559 Vitamin D deficiency, unspecified: Secondary | ICD-10-CM | POA: Diagnosis not present

## 2020-03-13 DIAGNOSIS — E039 Hypothyroidism, unspecified: Secondary | ICD-10-CM | POA: Diagnosis not present

## 2020-03-13 DIAGNOSIS — Z7901 Long term (current) use of anticoagulants: Secondary | ICD-10-CM | POA: Diagnosis not present

## 2020-03-13 DIAGNOSIS — Z87891 Personal history of nicotine dependence: Secondary | ICD-10-CM | POA: Diagnosis not present

## 2020-03-13 DIAGNOSIS — E538 Deficiency of other specified B group vitamins: Secondary | ICD-10-CM | POA: Diagnosis not present

## 2020-03-13 DIAGNOSIS — K219 Gastro-esophageal reflux disease without esophagitis: Secondary | ICD-10-CM | POA: Diagnosis not present

## 2020-03-13 DIAGNOSIS — Z7984 Long term (current) use of oral hypoglycemic drugs: Secondary | ICD-10-CM | POA: Diagnosis not present

## 2020-03-13 DIAGNOSIS — I10 Essential (primary) hypertension: Secondary | ICD-10-CM | POA: Diagnosis not present

## 2020-03-13 DIAGNOSIS — E1142 Type 2 diabetes mellitus with diabetic polyneuropathy: Secondary | ICD-10-CM | POA: Diagnosis not present

## 2020-03-13 DIAGNOSIS — E1151 Type 2 diabetes mellitus with diabetic peripheral angiopathy without gangrene: Secondary | ICD-10-CM | POA: Diagnosis not present

## 2020-03-13 DIAGNOSIS — J45909 Unspecified asthma, uncomplicated: Secondary | ICD-10-CM | POA: Diagnosis not present

## 2020-03-13 DIAGNOSIS — M5440 Lumbago with sciatica, unspecified side: Secondary | ICD-10-CM | POA: Diagnosis not present

## 2020-03-13 DIAGNOSIS — Z7902 Long term (current) use of antithrombotics/antiplatelets: Secondary | ICD-10-CM | POA: Diagnosis not present

## 2020-03-13 DIAGNOSIS — N390 Urinary tract infection, site not specified: Secondary | ICD-10-CM | POA: Diagnosis not present

## 2020-03-13 DIAGNOSIS — E782 Mixed hyperlipidemia: Secondary | ICD-10-CM | POA: Diagnosis not present

## 2020-03-13 DIAGNOSIS — Z9181 History of falling: Secondary | ICD-10-CM | POA: Diagnosis not present

## 2020-03-13 DIAGNOSIS — I480 Paroxysmal atrial fibrillation: Secondary | ICD-10-CM | POA: Diagnosis not present

## 2020-03-13 DIAGNOSIS — F32A Depression, unspecified: Secondary | ICD-10-CM | POA: Diagnosis not present

## 2020-03-13 DIAGNOSIS — E871 Hypo-osmolality and hyponatremia: Secondary | ICD-10-CM | POA: Diagnosis not present

## 2020-03-13 DIAGNOSIS — R296 Repeated falls: Secondary | ICD-10-CM | POA: Diagnosis not present

## 2020-03-14 DIAGNOSIS — Z7901 Long term (current) use of anticoagulants: Secondary | ICD-10-CM | POA: Diagnosis not present

## 2020-03-14 DIAGNOSIS — Z87891 Personal history of nicotine dependence: Secondary | ICD-10-CM | POA: Diagnosis not present

## 2020-03-14 DIAGNOSIS — E039 Hypothyroidism, unspecified: Secondary | ICD-10-CM | POA: Diagnosis not present

## 2020-03-14 DIAGNOSIS — M5440 Lumbago with sciatica, unspecified side: Secondary | ICD-10-CM | POA: Diagnosis not present

## 2020-03-14 DIAGNOSIS — E871 Hypo-osmolality and hyponatremia: Secondary | ICD-10-CM | POA: Diagnosis not present

## 2020-03-14 DIAGNOSIS — M159 Polyosteoarthritis, unspecified: Secondary | ICD-10-CM | POA: Diagnosis not present

## 2020-03-14 DIAGNOSIS — K760 Fatty (change of) liver, not elsewhere classified: Secondary | ICD-10-CM | POA: Diagnosis not present

## 2020-03-14 DIAGNOSIS — E1142 Type 2 diabetes mellitus with diabetic polyneuropathy: Secondary | ICD-10-CM | POA: Diagnosis not present

## 2020-03-14 DIAGNOSIS — N390 Urinary tract infection, site not specified: Secondary | ICD-10-CM | POA: Diagnosis not present

## 2020-03-14 DIAGNOSIS — R296 Repeated falls: Secondary | ICD-10-CM | POA: Diagnosis not present

## 2020-03-14 DIAGNOSIS — I10 Essential (primary) hypertension: Secondary | ICD-10-CM | POA: Diagnosis not present

## 2020-03-14 DIAGNOSIS — I480 Paroxysmal atrial fibrillation: Secondary | ICD-10-CM | POA: Diagnosis not present

## 2020-03-14 DIAGNOSIS — E1151 Type 2 diabetes mellitus with diabetic peripheral angiopathy without gangrene: Secondary | ICD-10-CM | POA: Diagnosis not present

## 2020-03-14 DIAGNOSIS — Z7984 Long term (current) use of oral hypoglycemic drugs: Secondary | ICD-10-CM | POA: Diagnosis not present

## 2020-03-14 DIAGNOSIS — E538 Deficiency of other specified B group vitamins: Secondary | ICD-10-CM | POA: Diagnosis not present

## 2020-03-14 DIAGNOSIS — F32A Depression, unspecified: Secondary | ICD-10-CM | POA: Diagnosis not present

## 2020-03-14 DIAGNOSIS — E782 Mixed hyperlipidemia: Secondary | ICD-10-CM | POA: Diagnosis not present

## 2020-03-14 DIAGNOSIS — E559 Vitamin D deficiency, unspecified: Secondary | ICD-10-CM | POA: Diagnosis not present

## 2020-03-14 DIAGNOSIS — Z8673 Personal history of transient ischemic attack (TIA), and cerebral infarction without residual deficits: Secondary | ICD-10-CM | POA: Diagnosis not present

## 2020-03-14 DIAGNOSIS — J45909 Unspecified asthma, uncomplicated: Secondary | ICD-10-CM | POA: Diagnosis not present

## 2020-03-14 DIAGNOSIS — K219 Gastro-esophageal reflux disease without esophagitis: Secondary | ICD-10-CM | POA: Diagnosis not present

## 2020-03-14 DIAGNOSIS — Z7902 Long term (current) use of antithrombotics/antiplatelets: Secondary | ICD-10-CM | POA: Diagnosis not present

## 2020-03-14 DIAGNOSIS — Z9181 History of falling: Secondary | ICD-10-CM | POA: Diagnosis not present

## 2020-05-08 ENCOUNTER — Other Ambulatory Visit: Payer: Self-pay

## 2020-05-09 ENCOUNTER — Ambulatory Visit: Payer: Medicare Other | Admitting: Cardiology

## 2020-05-12 ENCOUNTER — Other Ambulatory Visit: Payer: Self-pay | Admitting: *Deleted

## 2020-05-12 NOTE — Patient Outreach (Signed)
[facility]) Care Management  05/12/2020  AARNA MIHALKO 08/27/1933 [id number]  Telephone outreach for care management services per [facility] referral (CM, CHF, HTN)  Spoke briefly with Ms. Manka who says she is doing well and doesn't think she needs the service, however, she is willing to receive our information and will consider the service then. Will call her again in 2 weeks.  Zara Council. Burgess Estelle, MSN, [facility] Gerontological Nurse Practitioner [facility] Care Management [phone]

## 2020-05-17 ENCOUNTER — Encounter: Payer: Self-pay | Admitting: Cardiology

## 2020-05-17 ENCOUNTER — Other Ambulatory Visit: Payer: Self-pay

## 2020-05-17 ENCOUNTER — Ambulatory Visit (INDEPENDENT_AMBULATORY_CARE_PROVIDER_SITE_OTHER): Payer: Medicare Other | Admitting: Cardiology

## 2020-05-17 VITALS — BP 126/56 | HR 88 | Ht 64.6 in | Wt 116.2 lb

## 2020-05-17 DIAGNOSIS — E782 Mixed hyperlipidemia: Secondary | ICD-10-CM | POA: Diagnosis not present

## 2020-05-17 DIAGNOSIS — I10 Essential (primary) hypertension: Secondary | ICD-10-CM | POA: Diagnosis not present

## 2020-05-17 DIAGNOSIS — I2510 Atherosclerotic heart disease of native coronary artery without angina pectoris: Secondary | ICD-10-CM | POA: Diagnosis not present

## 2020-05-17 DIAGNOSIS — I480 Paroxysmal atrial fibrillation: Secondary | ICD-10-CM | POA: Diagnosis not present

## 2020-05-17 NOTE — Progress Notes (Signed)
Cardiology Office Note:    Date:  05/17/2020   ID:  Elaine Mathews, DOB 1933/05/16, MRN [id number]  PCP:  Lucianne Lei, MD  Cardiologist:  Garwin Brothers, MD   Referring MD: Lucianne Lei, MD    ASSESSMENT:    1. Paroxysmal atrial fibrillation (HCC)   2. Essential hypertension   3. Coronary artery disease involving native coronary artery of native heart without angina pectoris   4. Mixed hyperlipidemia    PLAN:    In order of problems listed above:  1. Coronary artery disease: Secondary prevention stressed with the patient.  Importance of compliance with diet medication stressed and she vocalized understanding she was advised to ambulate this appropriately and she promises to do so. 2. Paroxysmal atrial fibrillation:I discussed with the patient atrial fibrillation, disease process. Management and therapy including rate and rhythm control, anticoagulation benefits and potential risks were discussed extensively with the patient. Patient had multiple questions which were answered to patient's satisfaction. 3. Essential hypertension: Blood pressure stable and diet was emphasized. 4. Mixed dyslipidemia: Diet emphasized and lipids were reviewed and she is happy about it. 5. She is on Plavix for history of coronary stent.  She is not taking aspirin as she cannot tolerate it.Patient will be seen in follow-up appointment in 6 months or earlier if the patient has any concerns    Medication Adjustments/Labs and Tests Ordered: Current medicines are reviewed at length with the patient today.  Concerns regarding medicines are outlined above.  No orders of the defined types were placed in this encounter.  No orders of the defined types were placed in this encounter.    No chief complaint on file.    History of Present Illness:    Elaine Mathews is a 87 y.o. female.  Patient has past medical history of coronary artery disease post stenting in 2020.  She denies any problems at this time.  She  has paroxysmal atrial fibrillation essential hypertension and dyslipidemia.  She takes care of activities of daily living.  No chest pain orthopnea or PND.  At the time of my evaluation, the patient is alert awake oriented and in no distress.  Past Medical History:  Diagnosis Date  . A-fib (HCC)   . Abrasion, knee, left, initial encounter 10/28/2019  . AF (paroxysmal atrial fibrillation) (HCC) 10/28/2019  . Allergic rhinosinusitis   . Arthritis   . Asthma   . Ataxia    With generalized weakness in legs  . CAD (coronary artery disease)   . CAD in native artery 10/28/2019  . Cataract, bilateral   . CHF (congestive heart failure) (HCC)   . Chronic diastolic CHF (congestive heart failure) (HCC) 10/28/2019  . Comprehensive diabetic foot examination, type 2 DM, encounter for (HCC) 02/15/2020  . Controlled type 2 diabetes with neuropathy (HCC) 01/06/2017  . Coronary artery disease   . Dementia without behavioral disturbance (HCC) 10/28/2019  . Diabetes mellitus due to underlying condition with unspecified complications (HCC) 11/17/2019  . Diabetes mellitus without complication (HCC)   . Diarrhea 10/28/2019  . DM2 (diabetes mellitus, type 2) (HCC) 10/28/2019  . Dysrhythmia   . Elevated LFTs 07/12/2018  . Elevated liver enzymes   . Essential hypertension 11/13/2017  . Fatty liver   . Foot lesion   . History of stroke 10/28/2019  . Hyperlipidemia   . Hypertension   . Hypothyroidism   . Leg pain, bilateral   . MI (myocardial infarction) (HCC)   . Mild asthma   .  Mild vascular neurocognitive disorder (HCC) 11/13/2018  . Nail dystrophy 03/08/2019  . Neuromuscular disorder (HCC)   . NSTEMI (non-ST elevated myocardial infarction) (HCC) 07/12/2018  . Onychomycosis due to dermatophyte 02/15/2020  . Paroxysmal atrial fibrillation (HCC) 11/13/2017  . Peripheral vascular disease (HCC)   . Pre-ulcerative calluses 01/06/2017  . Prolonged QT interval 10/28/2019  . PVD (peripheral vascular disease) (HCC)   .  Seizure (HCC)   . Seizure disorder (HCC) 10/28/2019  . Sinus pause 07/13/2018  . Spleen hematoma without rupture of capsule, without open wound into cavity 07/15/2018  . Stroke [facility])    Right parietal  . Stroke (HCC)   . Substernal chest pain 01/18/2020  . Syncope and collapse 10/28/2019  . Syncope, vasovagal 07/15/2018  . Thyroid disease   . Type 2 diabetes mellitus with hyperlipidemia (HCC) 10/28/2019  . UTI (urinary tract infection) 01/21/2020  . Vitamin D deficiency     Past Surgical History:  Procedure Laterality Date  . APPENDECTOMY    . CATARACT EXTRACTION Bilateral   . CHOLECYSTECTOMY    . CORONARY ANGIOPLASTY    . CORONARY STENT INTERVENTION N/A 07/13/2018   Procedure: CORONARY STENT INTERVENTION;  Surgeon: Yvonne Kendall, MD;  Location: [facility]VASIVE CV LAB;  Service: Cardiovascular;  Laterality: N/A;  . INTRAVASCULAR PRESSURE WIRE/FFR STUDY N/A 07/13/2018   Procedure: INTRAVASCULAR PRESSURE WIRE/FFR STUDY;  Surgeon: Yvonne Kendall, MD;  Location: [facility] LAB;  Service: Cardiovascular;  Laterality: N/A;  . LEFT HEART CATH AND CORONARY ANGIOGRAPHY N/A 07/13/2018   Procedure: LEFT HEART CATH AND CORONARY ANGIOGRAPHY;  Surgeon: Yvonne Kendall, MD;  Location: [facility] CV LAB;  Service: Cardiovascular;  Laterality: N/A;  . TONSILLECTOMY      Current Medications: Current Meds  Medication Sig  . atorvastatin (LIPITOR) 40 MG tablet Take 1 tablet (40 mg total) by mouth daily at 6 PM.  . carvedilol (COREG) 3.125 MG tablet Take 3.125 mg by mouth 2 (two) times daily with a meal.  . clopidogrel (PLAVIX) 75 MG tablet Take 1 tablet (75 mg total) by mouth daily with breakfast.  . ezetimibe (ZETIA) 10 MG tablet Take 10 mg by mouth every evening.   . famotidine (PEPCID) 40 MG tablet Take 40 mg by mouth daily.  Marland Kitchen levETIRAcetam (KEPPRA) 250 MG tablet Take 250 mg by mouth every morning. And takes 500 mg every evening  . levothyroxine (SYNTHROID) 75 MCG tablet Take 75 mcg by mouth daily  before breakfast.  . memantine (NAMENDA) 10 MG tablet Take 10 mg by mouth 2 (two) times daily.  . metFORMIN (GLUCOPHAGE) 500 MG tablet Take 250 mg by mouth 2 (two) times daily.   . Misc Natural Products (GLUCOSAMINE CHONDROITIN TRIPLE) TABS Take 2 tablets by mouth every other day.   . montelukast (SINGULAIR) 10 MG tablet Take 10 mg by mouth every evening.   . nitroGLYCERIN (NITROSTAT) 0.4 MG SL tablet Place 1 tablet (0.4 mg total) under the tongue every 5 (five) minutes as needed for chest pain.  . polyvinyl alcohol (LIQUIFILM TEARS) 1.4 % ophthalmic solution Place 1 drop into both eyes as needed for dry eyes.  . potassium chloride (MICRO-K) 10 MEQ CR capsule Take 10 mEq by mouth daily.  . Prenatal Vit-Fe Fumarate-FA (PRENATAL VITAMIN PO) Take 1 tablet by mouth daily.  . Probiotic Product [facility] CAPS Take 1 capsule by mouth daily.  . rivaroxaban (XARELTO) 20 MG TABS tablet Take 1 tablet (20 mg total) by mouth daily with supper.  . sertraline (ZOLOFT) 50 MG  tablet Take 50 mg by mouth daily.  . sodium chloride 1 g tablet Take 1 g by mouth daily.  . Vitamin D, Ergocalciferol, (DRISDOL) 1.25 MG (50000 UNIT) CAPS capsule Take 50,000 Units by mouth every Wednesday.     Allergies:   Codeine and Penicillins   Social History   Socioeconomic History  . Marital status: Divorced    Spouse name: Not on file  . Number of children: Not on file  . Years of education: Not on file  . Highest education level: Not on file  Occupational History  . Not on file  Tobacco Use  . Smoking status: Never Smoker  . Smokeless tobacco: Never Used  Vaping Use  . Vaping Use: Never used  Substance and Sexual Activity  . Alcohol use: Not Currently  . Drug use: No  . Sexual activity: Not Currently    Partners: Male  Other Topics Concern  . Not on file  Social History Narrative   ** Merged History Encounter **       Lives with  Highest level of edu-  Right handed    Social Determinants of Health    Financial Resource Strain: Not on file  Food Insecurity: Not on file  Transportation Needs: Not on file  Physical Activity: Not on file  Stress: Not on file  Social Connections: Not on file     Family History: The patient's family history includes CAD in her father and another family member; COPD in her son; Diabetes in her mother; Heart attack in her mother and son; Hypercholesterolemia in her mother; Hypertension in her father and mother; Stroke in her father and mother.  ROS:   Please see the history of present illness.    All other systems reviewed and are negative.  EKGs/Labs/Other Studies Reviewed:    The following studies were reviewed today: I discussed my findings with the patient at length.   Recent Labs: 10/28/2019: B Natriuretic Peptide 114.6 10/29/2019: ALT 26; Magnesium 1.6 11/17/2019: TSH 0.881 01/21/2020: BUN 10; Creatinine, Ser 0.59; Hemoglobin 10.2; Platelets 123; Potassium 3.6; Sodium 131  Recent Lipid Panel    Component Value Date/Time   CHOL 137 11/17/2019 1648   TRIG 139 11/17/2019 1648   HDL 48 11/17/2019 1648   CHOLHDL 2.9 11/17/2019 1648   CHOLHDL 3.7 07/13/2018 0234   VLDL 23 07/13/2018 0234   LDLCALC 65 11/17/2019 1648    Physical Exam:    VS:  BP (!) 126/56   Pulse 88   Ht 5' 4.6" (1.641 m)   Wt 116 lb 3.2 oz (52.7 kg)   SpO2 94%   BMI 19.58 kg/m     Wt Readings from Last 3 Encounters:  05/17/20 116 lb 3.2 oz (52.7 kg)  02/09/20 121 lb 3.2 oz (55 kg)  01/21/20 119 lb 6.4 oz (54.2 kg)     GEN: Patient is in no acute distress HEENT: Normal NECK: No JVD; No carotid bruits LYMPHATICS: No lymphadenopathy CARDIAC: Hear sounds regular, 2/6 systolic murmur at the apex. RESPIRATORY:  Clear to auscultation without rales, wheezing or rhonchi  ABDOMEN: Soft, non-tender, non-distended MUSCULOSKELETAL:  No edema; No deformity  SKIN: Warm and dry NEUROLOGIC:  Alert and oriented x 3 PSYCHIATRIC:  Normal affect   Signed, Garwin Brothers,  MD  05/17/2020 10:24 AM    [facility]

## 2020-05-17 NOTE — Patient Instructions (Signed)

## 2020-05-18 DIAGNOSIS — R600 Localized edema: Secondary | ICD-10-CM | POA: Diagnosis not present

## 2020-05-18 DIAGNOSIS — I10 Essential (primary) hypertension: Secondary | ICD-10-CM | POA: Diagnosis not present

## 2020-05-18 DIAGNOSIS — E039 Hypothyroidism, unspecified: Secondary | ICD-10-CM | POA: Diagnosis not present

## 2020-05-18 DIAGNOSIS — Z79899 Other long term (current) drug therapy: Secondary | ICD-10-CM | POA: Diagnosis not present

## 2020-05-18 DIAGNOSIS — E1140 Type 2 diabetes mellitus with diabetic neuropathy, unspecified: Secondary | ICD-10-CM | POA: Diagnosis not present

## 2020-05-18 DIAGNOSIS — E782 Mixed hyperlipidemia: Secondary | ICD-10-CM | POA: Diagnosis not present

## 2020-05-18 DIAGNOSIS — M7989 Other specified soft tissue disorders: Secondary | ICD-10-CM | POA: Diagnosis not present

## 2020-05-26 ENCOUNTER — Other Ambulatory Visit: Payer: Self-pay | Admitting: *Deleted

## 2020-05-26 ENCOUNTER — Other Ambulatory Visit: Payer: Self-pay

## 2020-05-26 NOTE — Patient Outreach (Signed)
[facility]) Care Management  05/26/2020  Elaine Mathews [birth year] [id number]  Second telephone outreach for [facility] referral after [facility] information sent. Talked with Elaine Mathews and also her daughter, Elaine Mathews. Elaine Mathews reports that her mother has a tablet and equipment to report back to [facility] on her glucose, wt, and BPs. Her levels are all within parameters. Her daughter says she is doing great.  Reinforced to keep up the good work. We agreed that at this time she does not need the service. Provided my phone number again and encouraged that they could call me as an additional resource if need be. They voiced appreciation for the outreach.  Zara Council. Burgess Estelle, MSN, [facility] Gerontological Nurse Practitioner [facility] Care Management [phone]

## 2020-06-13 DIAGNOSIS — E785 Hyperlipidemia, unspecified: Secondary | ICD-10-CM | POA: Diagnosis not present

## 2020-06-13 DIAGNOSIS — Z0000 Encounter for general adult medical examination without abnormal findings: Secondary | ICD-10-CM | POA: Diagnosis not present

## 2020-06-13 DIAGNOSIS — Z9181 History of falling: Secondary | ICD-10-CM | POA: Diagnosis not present

## 2020-06-30 DIAGNOSIS — S0101XA Laceration without foreign body of scalp, initial encounter: Secondary | ICD-10-CM | POA: Diagnosis not present

## 2020-06-30 DIAGNOSIS — J329 Chronic sinusitis, unspecified: Secondary | ICD-10-CM | POA: Diagnosis not present

## 2020-06-30 DIAGNOSIS — S0990XA Unspecified injury of head, initial encounter: Secondary | ICD-10-CM | POA: Diagnosis not present

## 2020-06-30 DIAGNOSIS — E119 Type 2 diabetes mellitus without complications: Secondary | ICD-10-CM | POA: Diagnosis not present

## 2020-06-30 DIAGNOSIS — Z79899 Other long term (current) drug therapy: Secondary | ICD-10-CM | POA: Diagnosis not present

## 2020-06-30 DIAGNOSIS — S0181XA Laceration without foreign body of other part of head, initial encounter: Secondary | ICD-10-CM | POA: Diagnosis not present

## 2020-06-30 DIAGNOSIS — G319 Degenerative disease of nervous system, unspecified: Secondary | ICD-10-CM | POA: Diagnosis not present

## 2020-06-30 DIAGNOSIS — W19XXXA Unspecified fall, initial encounter: Secondary | ICD-10-CM | POA: Diagnosis not present

## 2020-06-30 DIAGNOSIS — Z743 Need for continuous supervision: Secondary | ICD-10-CM | POA: Diagnosis not present

## 2020-06-30 DIAGNOSIS — E039 Hypothyroidism, unspecified: Secondary | ICD-10-CM | POA: Diagnosis not present

## 2020-06-30 DIAGNOSIS — S0001XA Abrasion of scalp, initial encounter: Secondary | ICD-10-CM | POA: Diagnosis not present

## 2020-06-30 DIAGNOSIS — R110 Nausea: Secondary | ICD-10-CM | POA: Diagnosis not present

## 2020-06-30 DIAGNOSIS — J45909 Unspecified asthma, uncomplicated: Secondary | ICD-10-CM | POA: Diagnosis not present

## 2020-06-30 DIAGNOSIS — Z7901 Long term (current) use of anticoagulants: Secondary | ICD-10-CM | POA: Diagnosis not present

## 2020-06-30 DIAGNOSIS — I10 Essential (primary) hypertension: Secondary | ICD-10-CM | POA: Diagnosis not present

## 2020-06-30 DIAGNOSIS — S0081XA Abrasion of other part of head, initial encounter: Secondary | ICD-10-CM | POA: Diagnosis not present

## 2020-06-30 DIAGNOSIS — G9389 Other specified disorders of brain: Secondary | ICD-10-CM | POA: Diagnosis not present

## 2020-06-30 DIAGNOSIS — Z7902 Long term (current) use of antithrombotics/antiplatelets: Secondary | ICD-10-CM | POA: Diagnosis not present

## 2020-07-24 ENCOUNTER — Ambulatory Visit: Payer: Medicare Other | Admitting: Cardiology

## 2020-09-13 ENCOUNTER — Other Ambulatory Visit: Payer: Self-pay | Admitting: Cardiology

## 2020-09-27 ENCOUNTER — Other Ambulatory Visit: Payer: Self-pay | Admitting: Cardiology

## 2020-10-18 DIAGNOSIS — R21 Rash and other nonspecific skin eruption: Secondary | ICD-10-CM | POA: Diagnosis not present

## 2020-10-18 DIAGNOSIS — I10 Essential (primary) hypertension: Secondary | ICD-10-CM | POA: Diagnosis not present

## 2020-10-18 DIAGNOSIS — L853 Xerosis cutis: Secondary | ICD-10-CM | POA: Diagnosis not present

## 2020-10-25 ENCOUNTER — Other Ambulatory Visit: Payer: Self-pay | Admitting: Cardiology

## 2020-10-31 DIAGNOSIS — L853 Xerosis cutis: Secondary | ICD-10-CM | POA: Diagnosis not present

## 2020-10-31 DIAGNOSIS — R21 Rash and other nonspecific skin eruption: Secondary | ICD-10-CM | POA: Diagnosis not present

## 2020-11-06 DIAGNOSIS — I10 Essential (primary) hypertension: Secondary | ICD-10-CM | POA: Diagnosis not present

## 2020-11-06 DIAGNOSIS — E782 Mixed hyperlipidemia: Secondary | ICD-10-CM | POA: Diagnosis not present

## 2020-11-06 DIAGNOSIS — E1142 Type 2 diabetes mellitus with diabetic polyneuropathy: Secondary | ICD-10-CM | POA: Diagnosis not present

## 2020-11-08 DIAGNOSIS — Z7409 Other reduced mobility: Secondary | ICD-10-CM | POA: Diagnosis not present

## 2020-11-08 DIAGNOSIS — I2510 Atherosclerotic heart disease of native coronary artery without angina pectoris: Secondary | ICD-10-CM | POA: Diagnosis not present

## 2020-11-08 DIAGNOSIS — I10 Essential (primary) hypertension: Secondary | ICD-10-CM | POA: Diagnosis not present

## 2020-11-08 DIAGNOSIS — E782 Mixed hyperlipidemia: Secondary | ICD-10-CM | POA: Diagnosis not present

## 2020-11-08 DIAGNOSIS — E1140 Type 2 diabetes mellitus with diabetic neuropathy, unspecified: Secondary | ICD-10-CM | POA: Diagnosis not present

## 2020-11-08 DIAGNOSIS — E559 Vitamin D deficiency, unspecified: Secondary | ICD-10-CM | POA: Diagnosis not present

## 2020-11-08 DIAGNOSIS — R600 Localized edema: Secondary | ICD-10-CM | POA: Diagnosis not present

## 2020-11-08 DIAGNOSIS — M7989 Other specified soft tissue disorders: Secondary | ICD-10-CM | POA: Diagnosis not present

## 2020-11-08 DIAGNOSIS — I739 Peripheral vascular disease, unspecified: Secondary | ICD-10-CM | POA: Diagnosis not present

## 2020-11-08 DIAGNOSIS — J309 Allergic rhinitis, unspecified: Secondary | ICD-10-CM | POA: Diagnosis not present

## 2020-11-08 DIAGNOSIS — E039 Hypothyroidism, unspecified: Secondary | ICD-10-CM | POA: Diagnosis not present

## 2020-11-10 ENCOUNTER — Other Ambulatory Visit: Payer: Self-pay

## 2020-11-17 ENCOUNTER — Encounter: Payer: Self-pay | Admitting: Cardiology

## 2020-11-17 ENCOUNTER — Other Ambulatory Visit: Payer: Self-pay

## 2020-11-17 ENCOUNTER — Ambulatory Visit (INDEPENDENT_AMBULATORY_CARE_PROVIDER_SITE_OTHER): Payer: Medicare Other | Admitting: Cardiology

## 2020-11-17 VITALS — BP 148/66 | HR 96 | Ht 64.6 in | Wt 125.6 lb

## 2020-11-17 DIAGNOSIS — E559 Vitamin D deficiency, unspecified: Secondary | ICD-10-CM

## 2020-11-17 DIAGNOSIS — E039 Hypothyroidism, unspecified: Secondary | ICD-10-CM | POA: Diagnosis not present

## 2020-11-17 DIAGNOSIS — I10 Essential (primary) hypertension: Secondary | ICD-10-CM

## 2020-11-17 DIAGNOSIS — E785 Hyperlipidemia, unspecified: Secondary | ICD-10-CM

## 2020-11-17 DIAGNOSIS — I480 Paroxysmal atrial fibrillation: Secondary | ICD-10-CM

## 2020-11-17 DIAGNOSIS — E1169 Type 2 diabetes mellitus with other specified complication: Secondary | ICD-10-CM | POA: Diagnosis not present

## 2020-11-17 DIAGNOSIS — I2510 Atherosclerotic heart disease of native coronary artery without angina pectoris: Secondary | ICD-10-CM

## 2020-11-17 DIAGNOSIS — Z23 Encounter for immunization: Secondary | ICD-10-CM

## 2020-11-17 NOTE — Progress Notes (Signed)
Cardiology Office Note:    Date:  11/17/2020   ID:  Elaine Mathews, DOB 1933/09/12, MRN [id number]  PCP:  Lucianne Lei, MD  Cardiologist:  Garwin Brothers, MD   Referring MD: Lucianne Lei, MD    ASSESSMENT:    1. Coronary artery disease involving native coronary artery of native heart without angina pectoris   2. Essential hypertension   3. Paroxysmal atrial fibrillation (HCC)   4. Type 2 diabetes mellitus with hyperlipidemia (HCC)    PLAN:    In order of problems listed above:  Coronary artery disease: Secondary prevention stressed with the patient.  Importance of compliance with diet medication stressed and she vocalized understanding. Essential hypertension: Blood pressure stable and diet was emphasized.  Lifestyle modification urged.  Her blood pressure stable blood pressure readings at home are better.  She has an element of whitecoat hypertension. Mixed dyslipidemia: On statin therapy.  She will do blood work today including fasting lipids. Diabetes mellitus: Diet was emphasized.  Lifestyle modification urged.  She requests hemoglobin A1c to be done and we will do so.  She has history of vitamin D deficiency and we will obtain this also.  We will send copy of this lab work to primary care. Patient will be seen in follow-up appointment in 6 months or earlier if the patient has any concerns    Medication Adjustments/Labs and Tests Ordered: Current medicines are reviewed at length with the patient today.  Concerns regarding medicines are outlined above.  No orders of the defined types were placed in this encounter.  No orders of the defined types were placed in this encounter.    No chief complaint on file.    History of Present Illness:    Elaine Mathews is a 87 y.o. female.  Patient has past medical history of paroxysmal atrial fibrillation, essential hypertension diabetes mellitus dyslipidemia.  She denies any problems at this time and takes care of activities of daily  living.  No chest pain orthopnea or PND.  At the time of my evaluation, the patient is alert awake oriented and in no distress.  Past Medical History:  Diagnosis Date   A-fib (HCC)    Abrasion, knee, left, initial encounter 10/28/2019   AF (paroxysmal atrial fibrillation) (HCC) 10/28/2019   Allergic rhinosinusitis    Arthritis    Asthma    Ataxia    With generalized weakness in legs   CAD (coronary artery disease)    CAD in native artery 10/28/2019   Cataract, bilateral    CHF (congestive heart failure) (HCC)    Chronic diastolic CHF (congestive heart failure) (HCC) 10/28/2019   Comprehensive diabetic foot examination, type 2 DM, encounter for (HCC) 02/15/2020   Controlled type 2 diabetes with neuropathy (HCC) 01/06/2017   Coronary artery disease    Dementia without behavioral disturbance (HCC) 10/28/2019   Diabetes mellitus due to underlying condition with unspecified complications (HCC) 11/17/2019   Diabetes mellitus without complication (HCC)    Diarrhea 10/28/2019   DM2 (diabetes mellitus, type 2) (HCC) 10/28/2019   Dysrhythmia    Elevated LFTs 07/12/2018   Elevated liver enzymes    Essential hypertension 11/13/2017   Fatty liver    Foot lesion    History of stroke 10/28/2019   Hyperlipidemia    Hypertension    Hypothyroidism    Leg pain, bilateral    MI (myocardial infarction) (HCC)    Mild asthma    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   Nail dystrophy  03/08/2019   Neuromuscular disorder (HCC)    NSTEMI (non-ST elevated myocardial infarction) (HCC) 07/12/2018   Onychomycosis due to dermatophyte 02/15/2020   Paroxysmal atrial fibrillation (HCC) 11/13/2017   Peripheral vascular disease (HCC)    Pre-ulcerative calluses 01/06/2017   Prolonged QT interval 10/28/2019   PVD (peripheral vascular disease) (HCC)    Seizure (HCC)    Seizure disorder (HCC) 10/28/2019   Sinus pause 07/13/2018   Spleen hematoma without rupture of capsule, without open wound into cavity 07/15/2018    Stroke (HCC)    Right parietal   Stroke (HCC)    Substernal chest pain 01/18/2020   Syncope and collapse 10/28/2019   Syncope, vasovagal 07/15/2018   Thyroid disease    Type 2 diabetes mellitus with hyperlipidemia (HCC) 10/28/2019   UTI (urinary tract infection) 01/21/2020   Vitamin D deficiency     Past Surgical History:  Procedure Laterality Date   APPENDECTOMY     CATARACT EXTRACTION Bilateral    CHOLECYSTECTOMY     CORONARY ANGIOPLASTY     CORONARY STENT INTERVENTION N/A 07/13/2018   Procedure: CORONARY STENT INTERVENTION;  Surgeon: Yvonne Kendall, MD;  [facility];  Service: Cardiovascular;  Laterality: N/A;   INTRAVASCULAR PRESSURE WIRE/FFR STUDY N/A 07/13/2018   Procedure: INTRAVASCULAR PRESSURE WIRE/FFR STUDY;  Surgeon: Yvonne Kendall, MD;  Location: [facility];  Service: Cardiovascular;  Laterality: N/A;   LEFT HEART CATH AND CORONARY ANGIOGRAPHY N/A 07/13/2018   Procedure: LEFT HEART CATH AND CORONARY ANGIOGRAPHY;  Surgeon: Yvonne Kendall, MD;  Location: [facility];  Service: Cardiovascular;  Laterality: N/A;   TONSILLECTOMY      Current Medications: Current Meds  Medication Sig   atorvastatin (LIPITOR) 40 MG tablet Take 1 tablet (40 mg total) by mouth daily at 6 PM.   carvedilol (COREG) 3.125 MG tablet TAKE 1 TABLET BY MOUTH 2 TIMES DAILY WITH A MEAL   clopidogrel (PLAVIX) 75 MG tablet TAKE 1 TABLET BY MOUTH DAILY WITH BREAKFAST   ezetimibe (ZETIA) 10 MG tablet Take 10 mg by mouth every evening.    famotidine (PEPCID) 40 MG tablet Take 40 mg by mouth daily.   furosemide (LASIX) 40 MG tablet Take 40 mg by mouth 2 (two) times daily.   levETIRAcetam (KEPPRA) 250 MG tablet Take 250 mg by mouth every morning. And takes 500 mg every evening   levothyroxine (SYNTHROID) 50 MCG tablet Take 50 mcg by mouth daily.   lisinopril (ZESTRIL) 40 MG tablet Take 40 mg by mouth daily.   losartan (COZAAR) 50 MG tablet Take 50 mg by mouth daily.   memantine  (NAMENDA) 10 MG tablet Take 10 mg by mouth 2 (two) times daily.   metFORMIN (GLUCOPHAGE) 500 MG tablet Take 250 mg by mouth 2 (two) times daily.    Misc Natural Products (GLUCOSAMINE CHONDROITIN TRIPLE) TABS Take 2 tablets by mouth every other day.    montelukast (SINGULAIR) 10 MG tablet Take 10 mg by mouth every evening.    nitroGLYCERIN (NITROSTAT) 0.4 MG SL tablet Place 1 tablet (0.4 mg total) under the tongue every 5 (five) minutes as needed for chest pain.   polyvinyl alcohol (LIQUIFILM TEARS) 1.4 % ophthalmic solution Place 1 drop into both eyes as needed for dry eyes.   potassium chloride (MICRO-K) 10 MEQ CR capsule TAKE 1 CAPSULE BY MOUTH DAILY   Prenatal Vit-Fe Fumarate-FA (PRENATAL VITAMIN PO) Take 1 tablet by mouth daily.   Probiotic Product [facility]) CAPS Take 1 capsule by mouth  daily.   rivaroxaban (XARELTO) 20 MG TABS tablet Take 1 tablet (20 mg total) by mouth daily with supper.   sertraline (ZOLOFT) 50 MG tablet Take 50 mg by mouth daily.   sodium chloride 1 g tablet Take 1 g by mouth daily.   Vitamin D, Ergocalciferol, (DRISDOL) 1.25 MG (50000 UNIT) CAPS capsule Take 50,000 Units by mouth every Wednesday.     Allergies:   Codeine and Penicillins   Social History   Socioeconomic History   Marital status: Divorced    Spouse name: Not on file   Number of children: Not on file   Years of education: Not on file   Highest education level: Not on file  Occupational History   Not on file  Tobacco Use   Smoking status: Never   Smokeless tobacco: Never  Vaping Use   Vaping Use: Never used  Substance and Sexual Activity   Alcohol use: Not Currently   Drug use: No   Sexual activity: Not Currently    Partners: Male  Other Topics Concern   Not on file  Social History Narrative   ** Merged History Encounter **       Lives with  Highest level of edu-  Right handed    Social Determinants of Health   Financial Resource Strain: Not on file  Food Insecurity: Not on  file  Transportation Needs: Not on file  Physical Activity: Not on file  Stress: Not on file  Social Connections: Not on file     Family History: The patient's family history includes CAD in her father and another family member; COPD in her son; Diabetes in her mother; Heart attack in her mother and son; Hypercholesterolemia in her mother; Hypertension in her father and mother; Stroke in her father and mother.  ROS:   Please see the history of present illness.    All other systems reviewed and are negative.  EKGs/Labs/Other Studies Reviewed:    The following studies were reviewed today: I discussed my findings with the patient at length.   Recent Labs: 01/21/2020: BUN 10; Creatinine, Ser 0.59; Hemoglobin 10.2; Platelets 123; Potassium 3.6; Sodium 131  Recent Lipid Panel    Component Value Date/Time   CHOL 137 11/17/2019 1648   TRIG 139 11/17/2019 1648   HDL 48 11/17/2019 1648   CHOLHDL 2.9 11/17/2019 1648   CHOLHDL 3.7 07/13/2018 0234   VLDL 23 07/13/2018 0234   LDLCALC 65 11/17/2019 1648    Physical Exam:    VS:  BP (!) 148/66   Pulse 96   Ht 5' 4.6" (1.641 m)   Wt 125 lb 9.6 oz (57 kg)   SpO2 96%   BMI 21.16 kg/m     Wt Readings from Last 3 Encounters:  11/17/20 125 lb 9.6 oz (57 kg)  05/17/20 116 lb 3.2 oz (52.7 kg)  02/09/20 121 lb 3.2 oz (55 kg)     GEN: Patient is in no acute distress HEENT: Normal NECK: No JVD; No carotid bruits LYMPHATICS: No lymphadenopathy CARDIAC: Hear sounds regular, 2/6 systolic murmur at the apex. RESPIRATORY:  Clear to auscultation without rales, wheezing or rhonchi  ABDOMEN: Soft, non-tender, non-distended MUSCULOSKELETAL:  No edema; No deformity  SKIN: Warm and dry NEUROLOGIC:  Alert and oriented x 3 PSYCHIATRIC:  Normal affect   Signed, Garwin Brothers, MD  11/17/2020 11:33 AM    [facility]

## 2020-11-17 NOTE — Patient Instructions (Signed)
Medication Instructions:  Your physician recommends that you continue on your current medications as directed. Please refer to the Current Medication list given to you today.  *If you need a refill on your cardiac medications before your next appointment, please call your pharmacy*   Lab Work: Your physician recommends that you have labs done in the office today. Your test included  basic metabolic panel, complete blood count, TSH, A1C, vitamin D, liver function and lipids.  If you have labs (blood work) drawn today and your tests are completely normal, you will receive your results only by: MyChart Message (if you have MyChart) OR A paper copy in the mail If you have any lab test that is abnormal or we need to change your treatment, we will call you to review the results.   Testing/Procedures: None ordered   Follow-Up: At [facility], you and your health needs are our priority.  As part of our continuing mission to provide you with exceptional heart care, we have created designated Provider Care Teams.  These Care Teams include your primary Cardiologist (physician) and Advanced Practice Providers (APPs -  Physician Assistants and Nurse Practitioners) who all work together to provide you with the care you need, when you need it.  We recommend signing up for the patient portal called "MyChart".  Sign up information is provided on this After Visit Summary.  MyChart is used to connect with patients for Virtual Visits (Telemedicine).  Patients are able to view lab/test results, encounter notes, upcoming appointments, etc.  Non-urgent messages can be sent to your provider as well.   To learn more about what you can do with MyChart, go to [email or web address].    Your next appointment:   9 month(s)  The format for your next appointment:   In Person  Provider:   Belva Crome, MD   Other Instructions NA

## 2020-11-18 LAB — CBC WITH DIFFERENTIAL/PLATELET
Basophils Absolute: 0 10*3/uL (ref 0.0–0.2)
Basos: 0 %
EOS (ABSOLUTE): 0.1 10*3/uL (ref 0.0–0.4)
Eos: 2 %
Hematocrit: 33.3 % — ABNORMAL LOW (ref 34.0–46.6)
Hemoglobin: 11.1 g/dL (ref 11.1–15.9)
Immature Grans (Abs): 0 10*3/uL (ref 0.0–0.1)
Immature Granulocytes: 0 %
Lymphocytes Absolute: 1.1 10*3/uL (ref 0.7–3.1)
Lymphs: 19 %
MCH: 28.8 pg (ref 26.6–33.0)
MCHC: 33.3 g/dL (ref 31.5–35.7)
MCV: 87 fL (ref 79–97)
Monocytes Absolute: 0.5 10*3/uL (ref 0.1–0.9)
Monocytes: 8 %
Neutrophils Absolute: 4.2 10*3/uL (ref 1.4–7.0)
Neutrophils: 71 %
Platelets: 175 10*3/uL (ref 150–450)
RBC: 3.85 x10E6/uL (ref 3.77–5.28)
RDW: 15.8 % — ABNORMAL HIGH (ref 11.7–15.4)
WBC: 5.9 10*3/uL (ref 3.4–10.8)

## 2020-11-18 LAB — TSH: TSH: 17.7 u[IU]/mL — ABNORMAL HIGH (ref 0.450–4.500)

## 2020-11-18 LAB — BASIC METABOLIC PANEL
BUN/Creatinine Ratio: 23 (ref 12–28)
BUN: 12 mg/dL (ref 8–27)
CO2: 20 mmol/L (ref 20–29)
Calcium: 8.5 mg/dL — ABNORMAL LOW (ref 8.7–10.3)
Chloride: 92 mmol/L — ABNORMAL LOW (ref 96–106)
Creatinine, Ser: 0.53 mg/dL — ABNORMAL LOW (ref 0.57–1.00)
Glucose: 173 mg/dL — ABNORMAL HIGH (ref 70–99)
Potassium: 3.8 mmol/L (ref 3.5–5.2)
Sodium: 131 mmol/L — ABNORMAL LOW (ref 134–144)
eGFR: 91 mL/min/{1.73_m2} (ref >59)

## 2020-11-18 LAB — HEPATIC FUNCTION PANEL
ALT: 22 IU/L (ref 0–32)
AST: 36 IU/L (ref 0–40)
Albumin: 4.2 g/dL (ref 3.6–4.6)
Alkaline Phosphatase: 73 IU/L (ref 44–121)
Bilirubin Total: 0.4 mg/dL (ref 0.0–1.2)
Bilirubin, Direct: 0.2 mg/dL (ref 0.00–0.40)
Total Protein: 6.7 g/dL (ref 6.0–8.5)

## 2020-11-18 LAB — LIPID PANEL
Chol/HDL Ratio: 2.3 ratio (ref 0.0–4.4)
Cholesterol, Total: 136 mg/dL (ref 100–199)
HDL: 58 mg/dL (ref >39)
LDL Chol Calc (NIH): 50 mg/dL (ref 0–99)
Triglycerides: 170 mg/dL — ABNORMAL HIGH (ref 0–149)
VLDL Cholesterol Cal: 28 mg/dL (ref 5–40)

## 2020-11-18 LAB — HEMOGLOBIN A1C
Est. average glucose Bld gHb Est-mCnc: 157 mg/dL
Hgb A1c MFr Bld: 7.1 % — ABNORMAL HIGH (ref 4.8–5.6)

## 2020-11-18 LAB — VITAMIN D 25 HYDROXY (VIT D DEFICIENCY, FRACTURES): Vit D, 25-Hydroxy: 60.2 ng/mL (ref 30.0–100.0)

## 2020-11-20 ENCOUNTER — Telehealth: Payer: Self-pay

## 2020-11-20 NOTE — Telephone Encounter (Signed)
-----   Message from Garwin Brothers, MD sent at 11/19/2020  7:14 PM EDT ----- Very abnormal TSH, call her and PCP office. Have her call PCP too. Otherwise The results of the study is unremarkable. Please inform patient. I will discuss in detail at next appointment. Cc  primary care/referring physician Garwin Brothers, MD 11/19/2020 7:14 PM

## 2020-11-20 NOTE — Telephone Encounter (Signed)
Spoke with patient regarding results and recommendation.  Patient verbalizes understanding and is agreeable to plan of care. Advised patient to call back with any issues or concerns.  

## 2020-12-04 DIAGNOSIS — L03119 Cellulitis of unspecified part of limb: Secondary | ICD-10-CM | POA: Diagnosis not present

## 2020-12-04 DIAGNOSIS — I10 Essential (primary) hypertension: Secondary | ICD-10-CM | POA: Diagnosis not present

## 2020-12-04 DIAGNOSIS — M7989 Other specified soft tissue disorders: Secondary | ICD-10-CM | POA: Diagnosis not present

## 2020-12-08 DIAGNOSIS — E1142 Type 2 diabetes mellitus with diabetic polyneuropathy: Secondary | ICD-10-CM | POA: Diagnosis not present

## 2020-12-08 DIAGNOSIS — E782 Mixed hyperlipidemia: Secondary | ICD-10-CM | POA: Diagnosis not present

## 2020-12-08 DIAGNOSIS — I10 Essential (primary) hypertension: Secondary | ICD-10-CM | POA: Diagnosis not present

## 2020-12-14 IMAGING — CT CT HEAD W/O CM
3 series · 16 of 47 positions shown, 19 images · non-contrast
Comparison: 07/14/2018

CLINICAL DATA: Nonspecific dizziness

EXAM:
CT HEAD WITHOUT CONTRAST
TECHNIQUE: Contiguous axial images were obtained from the base of the skull
through the vertex without intravenous contrast.

[Series 3: head 5.0 h30s · axial · 0.38mm/px · z∈[-94,+41]mm · 10 of 33 slices shown, 13 images]
[im 3/33  brain]
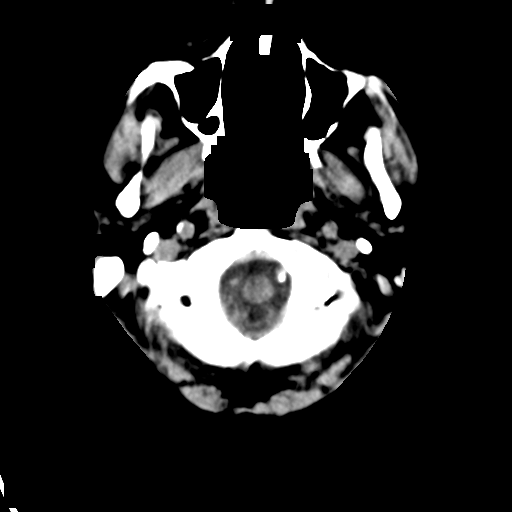
[im 3/33  bone]
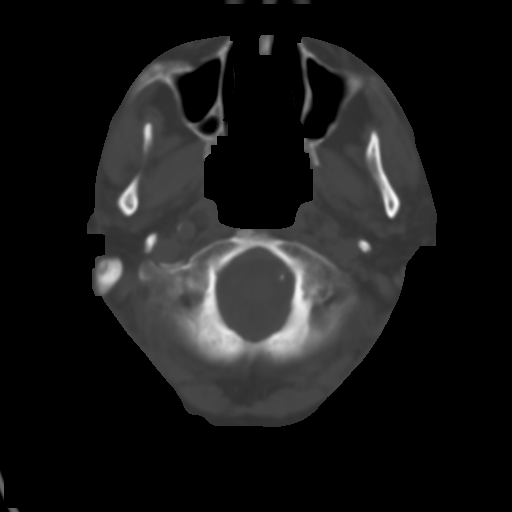
[im 6/33  brain]
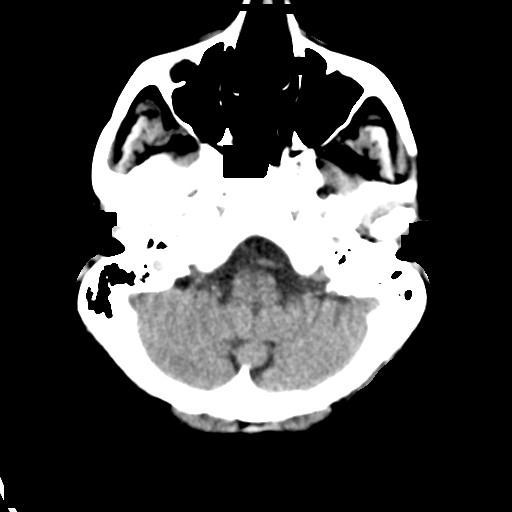
[im 9/33  brain]
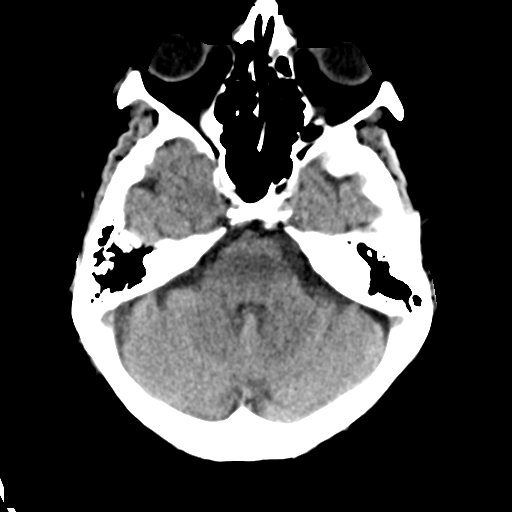
[im 12/33  brain]
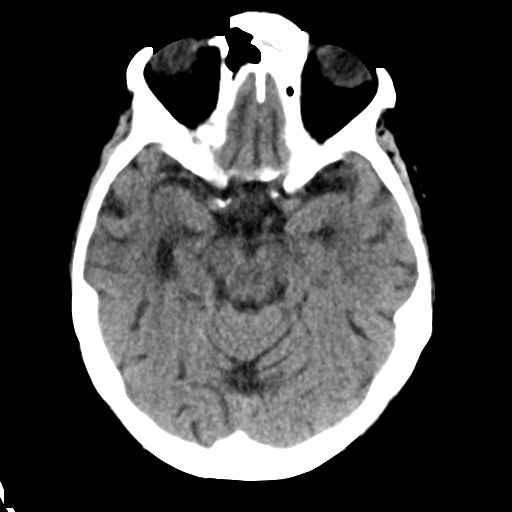
[im 15/33  brain]
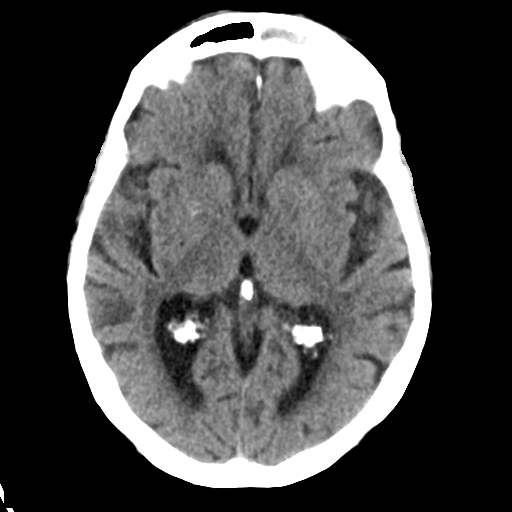
[im 15/33  bone]
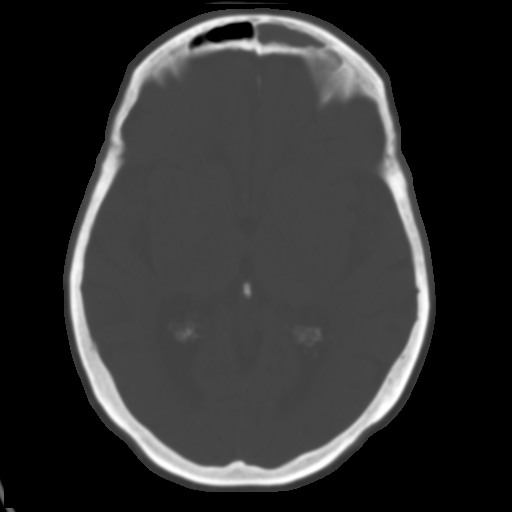
[im 18/33  brain]
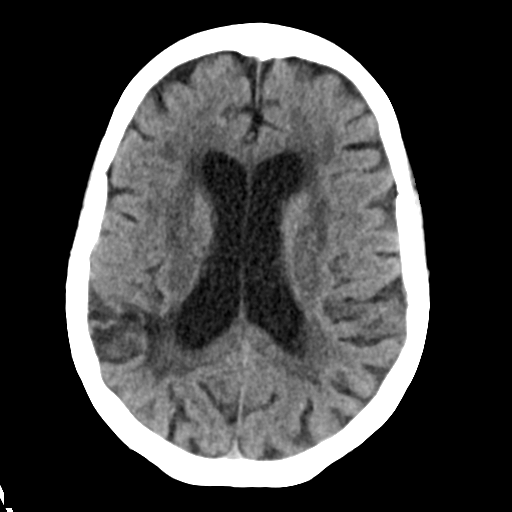
[im 21/33  brain]
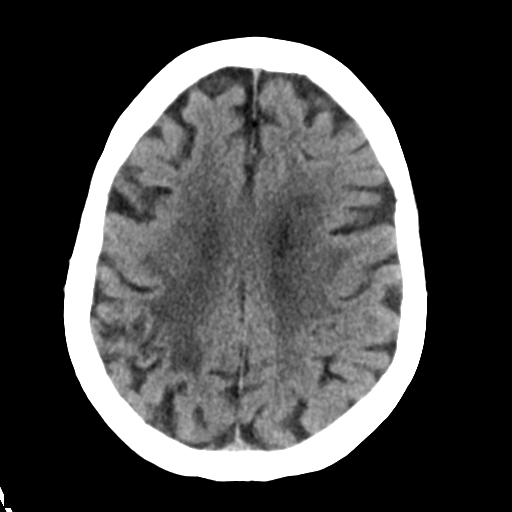
[im 25/33  brain]
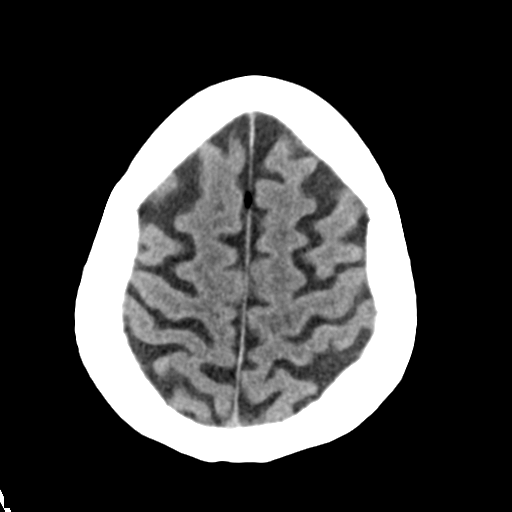
[im 27/33  brain]
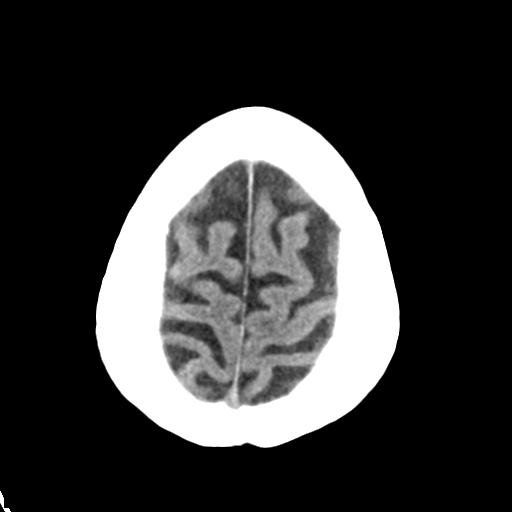
[im 27/33  bone]
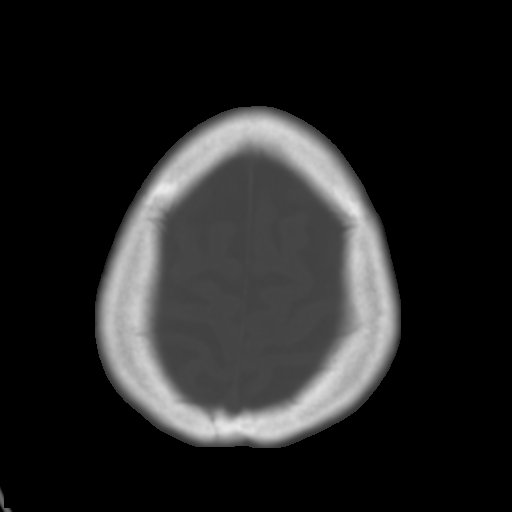
[im 30/33  brain]
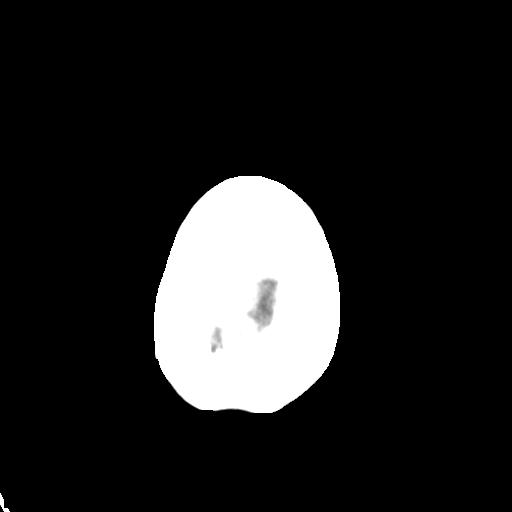

[Series 5: head 3.0 mpr cor · coronal · 0.32mm/px · 3 of 67 slices shown]
[im 23/67  brain]
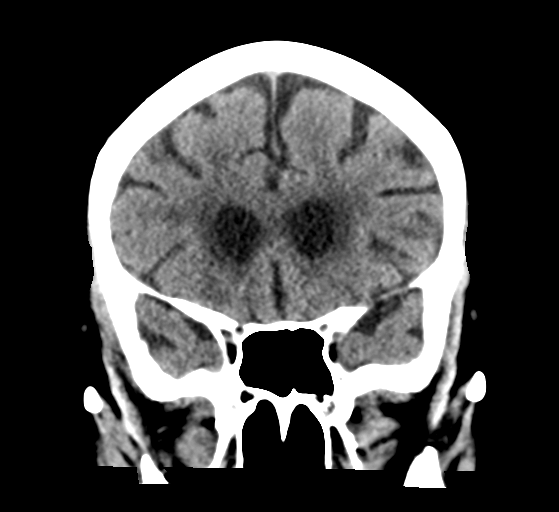
[im 30/67  brain]
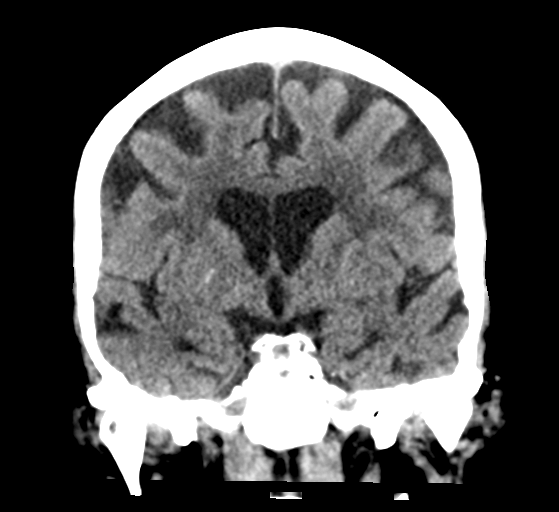
[im 37/67  brain]
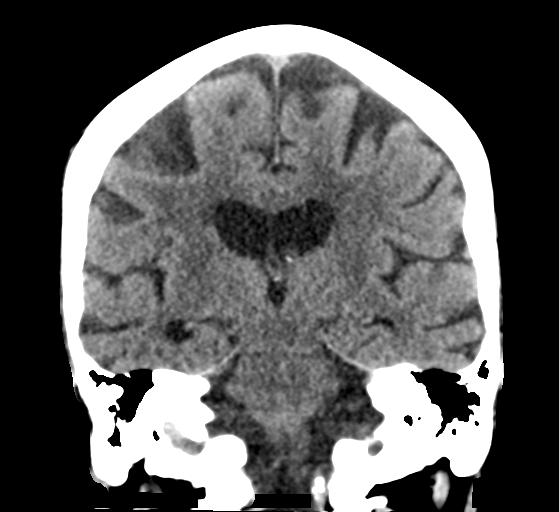

[Series 6: head 3.0 mpr sag · sagittal · 0.32mm/px · 3 of 56 slices shown]
[im 19/56  brain]
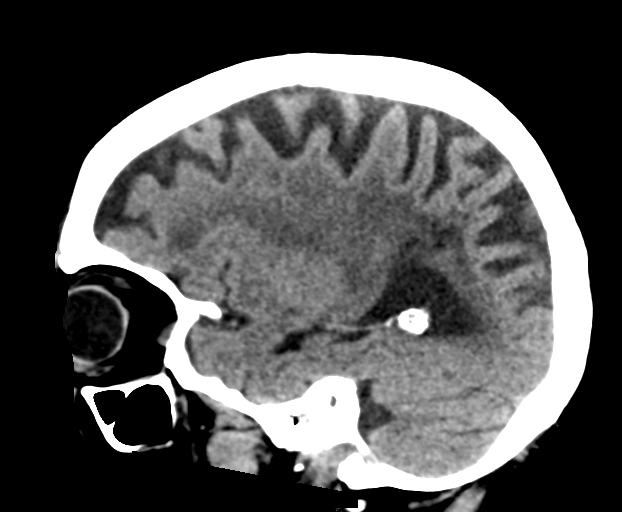
[im 28/56  brain]
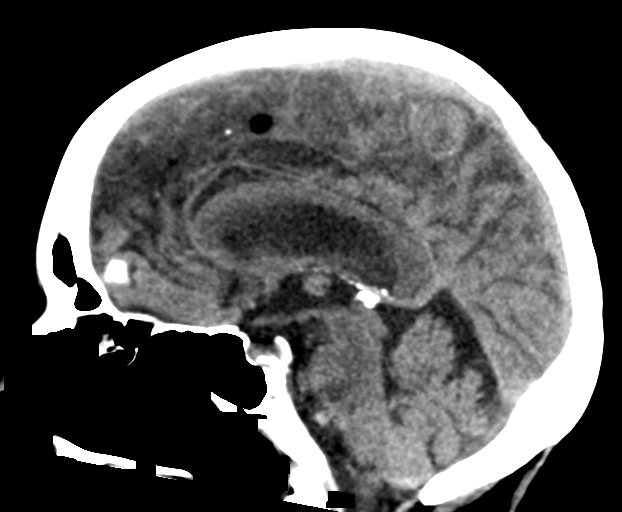
[im 37/56  brain]
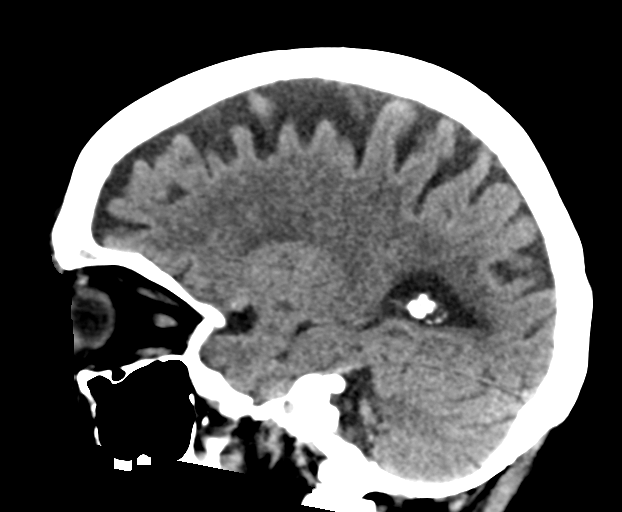

[16 of 47 positions shown; findings below may reference images not displayed]

FINDINGS: Brain: No evidence of acute infarction, hemorrhage, hydrocephalus,
extra-axial collection or mass lesion/mass effect. Remote right
parietal cortex based infarct which is moderate size. Confluent
chronic small vessel ischemia in the cerebral white matter. Cerebral
volume loss which is generalized.

Vascular: No hyperdense vessel or unexpected calcification.

Skull: Normal. Negative for fracture or focal lesion.

Sinuses/Orbits: Chronic completely opacified left frontal sinus.
Bilateral cataract resection.
IMPRESSION: 1. No acute or reversible finding.
2. Chronic small vessel ischemia and remote right parietal infarct.

## 2021-01-02 DIAGNOSIS — K219 Gastro-esophageal reflux disease without esophagitis: Secondary | ICD-10-CM | POA: Diagnosis not present

## 2021-01-02 DIAGNOSIS — K5730 Diverticulosis of large intestine without perforation or abscess without bleeding: Secondary | ICD-10-CM | POA: Diagnosis not present

## 2021-01-02 DIAGNOSIS — J321 Chronic frontal sinusitis: Secondary | ICD-10-CM | POA: Diagnosis not present

## 2021-01-02 DIAGNOSIS — Z7401 Bed confinement status: Secondary | ICD-10-CM | POA: Diagnosis not present

## 2021-01-02 DIAGNOSIS — S199XXA Unspecified injury of neck, initial encounter: Secondary | ICD-10-CM | POA: Diagnosis not present

## 2021-01-02 DIAGNOSIS — S0003XA Contusion of scalp, initial encounter: Secondary | ICD-10-CM | POA: Diagnosis not present

## 2021-01-02 DIAGNOSIS — I2510 Atherosclerotic heart disease of native coronary artery without angina pectoris: Secondary | ICD-10-CM | POA: Diagnosis not present

## 2021-01-02 DIAGNOSIS — J45909 Unspecified asthma, uncomplicated: Secondary | ICD-10-CM | POA: Diagnosis not present

## 2021-01-02 DIAGNOSIS — S32110A Nondisplaced Zone I fracture of sacrum, initial encounter for closed fracture: Secondary | ICD-10-CM | POA: Diagnosis not present

## 2021-01-02 DIAGNOSIS — S32512A Fracture of superior rim of left pubis, initial encounter for closed fracture: Secondary | ICD-10-CM | POA: Diagnosis not present

## 2021-01-02 DIAGNOSIS — Z8673 Personal history of transient ischemic attack (TIA), and cerebral infarction without residual deficits: Secondary | ICD-10-CM | POA: Diagnosis not present

## 2021-01-02 DIAGNOSIS — Z66 Do not resuscitate: Secondary | ICD-10-CM | POA: Diagnosis not present

## 2021-01-02 DIAGNOSIS — S32592A Other specified fracture of left pubis, initial encounter for closed fracture: Secondary | ICD-10-CM | POA: Diagnosis not present

## 2021-01-02 DIAGNOSIS — R569 Unspecified convulsions: Secondary | ICD-10-CM | POA: Diagnosis not present

## 2021-01-02 DIAGNOSIS — I252 Old myocardial infarction: Secondary | ICD-10-CM | POA: Diagnosis not present

## 2021-01-02 DIAGNOSIS — W19XXXA Unspecified fall, initial encounter: Secondary | ICD-10-CM | POA: Diagnosis not present

## 2021-01-02 DIAGNOSIS — Z885 Allergy status to narcotic agent status: Secondary | ICD-10-CM | POA: Diagnosis not present

## 2021-01-02 DIAGNOSIS — S3210XA Unspecified fracture of sacrum, initial encounter for closed fracture: Secondary | ICD-10-CM | POA: Diagnosis not present

## 2021-01-02 DIAGNOSIS — E119 Type 2 diabetes mellitus without complications: Secondary | ICD-10-CM | POA: Diagnosis not present

## 2021-01-02 DIAGNOSIS — Z87440 Personal history of urinary (tract) infections: Secondary | ICD-10-CM | POA: Diagnosis not present

## 2021-01-02 DIAGNOSIS — I4891 Unspecified atrial fibrillation: Secondary | ICD-10-CM | POA: Diagnosis not present

## 2021-01-02 DIAGNOSIS — G319 Degenerative disease of nervous system, unspecified: Secondary | ICD-10-CM | POA: Diagnosis not present

## 2021-01-02 DIAGNOSIS — Z7902 Long term (current) use of antithrombotics/antiplatelets: Secondary | ICD-10-CM | POA: Diagnosis not present

## 2021-01-02 DIAGNOSIS — I10 Essential (primary) hypertension: Secondary | ICD-10-CM | POA: Diagnosis not present

## 2021-01-02 DIAGNOSIS — Z743 Need for continuous supervision: Secondary | ICD-10-CM | POA: Diagnosis not present

## 2021-01-02 DIAGNOSIS — E039 Hypothyroidism, unspecified: Secondary | ICD-10-CM | POA: Diagnosis not present

## 2021-01-02 DIAGNOSIS — M47812 Spondylosis without myelopathy or radiculopathy, cervical region: Secondary | ICD-10-CM | POA: Diagnosis not present

## 2021-01-02 DIAGNOSIS — E7800 Pure hypercholesterolemia, unspecified: Secondary | ICD-10-CM | POA: Diagnosis not present

## 2021-01-02 DIAGNOSIS — Z880 Allergy status to penicillin: Secondary | ICD-10-CM | POA: Diagnosis not present

## 2021-01-02 DIAGNOSIS — Z043 Encounter for examination and observation following other accident: Secondary | ICD-10-CM | POA: Diagnosis not present

## 2021-01-02 DIAGNOSIS — M25552 Pain in left hip: Secondary | ICD-10-CM | POA: Diagnosis not present

## 2021-01-11 DIAGNOSIS — E7800 Pure hypercholesterolemia, unspecified: Secondary | ICD-10-CM | POA: Diagnosis not present

## 2021-01-11 DIAGNOSIS — K219 Gastro-esophageal reflux disease without esophagitis: Secondary | ICD-10-CM | POA: Diagnosis not present

## 2021-01-11 DIAGNOSIS — I10 Essential (primary) hypertension: Secondary | ICD-10-CM | POA: Diagnosis not present

## 2021-01-11 DIAGNOSIS — Z7984 Long term (current) use of oral hypoglycemic drugs: Secondary | ICD-10-CM | POA: Diagnosis not present

## 2021-01-11 DIAGNOSIS — Z8673 Personal history of transient ischemic attack (TIA), and cerebral infarction without residual deficits: Secondary | ICD-10-CM | POA: Diagnosis not present

## 2021-01-11 DIAGNOSIS — I252 Old myocardial infarction: Secondary | ICD-10-CM | POA: Diagnosis not present

## 2021-01-11 DIAGNOSIS — J45909 Unspecified asthma, uncomplicated: Secondary | ICD-10-CM | POA: Diagnosis not present

## 2021-01-11 DIAGNOSIS — K5790 Diverticulosis of intestine, part unspecified, without perforation or abscess without bleeding: Secondary | ICD-10-CM | POA: Diagnosis not present

## 2021-01-11 DIAGNOSIS — Z9181 History of falling: Secondary | ICD-10-CM | POA: Diagnosis not present

## 2021-01-11 DIAGNOSIS — E119 Type 2 diabetes mellitus without complications: Secondary | ICD-10-CM | POA: Diagnosis not present

## 2021-01-11 DIAGNOSIS — Z7901 Long term (current) use of anticoagulants: Secondary | ICD-10-CM | POA: Diagnosis not present

## 2021-01-11 DIAGNOSIS — I4891 Unspecified atrial fibrillation: Secondary | ICD-10-CM | POA: Diagnosis not present

## 2021-01-11 DIAGNOSIS — I2510 Atherosclerotic heart disease of native coronary artery without angina pectoris: Secondary | ICD-10-CM | POA: Diagnosis not present

## 2021-01-11 DIAGNOSIS — E039 Hypothyroidism, unspecified: Secondary | ICD-10-CM | POA: Diagnosis not present

## 2021-01-11 DIAGNOSIS — S32592D Other specified fracture of left pubis, subsequent encounter for fracture with routine healing: Secondary | ICD-10-CM | POA: Diagnosis not present

## 2021-01-11 DIAGNOSIS — Z7902 Long term (current) use of antithrombotics/antiplatelets: Secondary | ICD-10-CM | POA: Diagnosis not present

## 2021-01-11 DIAGNOSIS — Z87440 Personal history of urinary (tract) infections: Secondary | ICD-10-CM | POA: Diagnosis not present

## 2021-01-11 DIAGNOSIS — S3210XD Unspecified fracture of sacrum, subsequent encounter for fracture with routine healing: Secondary | ICD-10-CM | POA: Diagnosis not present

## 2021-01-11 DIAGNOSIS — M1990 Unspecified osteoarthritis, unspecified site: Secondary | ICD-10-CM | POA: Diagnosis not present

## 2021-01-12 DIAGNOSIS — I252 Old myocardial infarction: Secondary | ICD-10-CM | POA: Diagnosis not present

## 2021-01-12 DIAGNOSIS — K5790 Diverticulosis of intestine, part unspecified, without perforation or abscess without bleeding: Secondary | ICD-10-CM | POA: Diagnosis not present

## 2021-01-12 DIAGNOSIS — S3210XD Unspecified fracture of sacrum, subsequent encounter for fracture with routine healing: Secondary | ICD-10-CM | POA: Diagnosis not present

## 2021-01-12 DIAGNOSIS — S32592D Other specified fracture of left pubis, subsequent encounter for fracture with routine healing: Secondary | ICD-10-CM | POA: Diagnosis not present

## 2021-01-12 DIAGNOSIS — M1990 Unspecified osteoarthritis, unspecified site: Secondary | ICD-10-CM | POA: Diagnosis not present

## 2021-01-12 DIAGNOSIS — E119 Type 2 diabetes mellitus without complications: Secondary | ICD-10-CM | POA: Diagnosis not present

## 2021-01-12 DIAGNOSIS — E039 Hypothyroidism, unspecified: Secondary | ICD-10-CM | POA: Diagnosis not present

## 2021-01-12 DIAGNOSIS — Z7901 Long term (current) use of anticoagulants: Secondary | ICD-10-CM | POA: Diagnosis not present

## 2021-01-12 DIAGNOSIS — I10 Essential (primary) hypertension: Secondary | ICD-10-CM | POA: Diagnosis not present

## 2021-01-12 DIAGNOSIS — I2510 Atherosclerotic heart disease of native coronary artery without angina pectoris: Secondary | ICD-10-CM | POA: Diagnosis not present

## 2021-01-12 DIAGNOSIS — Z9181 History of falling: Secondary | ICD-10-CM | POA: Diagnosis not present

## 2021-01-12 DIAGNOSIS — E7800 Pure hypercholesterolemia, unspecified: Secondary | ICD-10-CM | POA: Diagnosis not present

## 2021-01-12 DIAGNOSIS — Z8673 Personal history of transient ischemic attack (TIA), and cerebral infarction without residual deficits: Secondary | ICD-10-CM | POA: Diagnosis not present

## 2021-01-12 DIAGNOSIS — K219 Gastro-esophageal reflux disease without esophagitis: Secondary | ICD-10-CM | POA: Diagnosis not present

## 2021-01-12 DIAGNOSIS — J45909 Unspecified asthma, uncomplicated: Secondary | ICD-10-CM | POA: Diagnosis not present

## 2021-01-12 DIAGNOSIS — I4891 Unspecified atrial fibrillation: Secondary | ICD-10-CM | POA: Diagnosis not present

## 2021-01-12 DIAGNOSIS — Z7902 Long term (current) use of antithrombotics/antiplatelets: Secondary | ICD-10-CM | POA: Diagnosis not present

## 2021-01-12 DIAGNOSIS — Z7984 Long term (current) use of oral hypoglycemic drugs: Secondary | ICD-10-CM | POA: Diagnosis not present

## 2021-01-12 DIAGNOSIS — Z87440 Personal history of urinary (tract) infections: Secondary | ICD-10-CM | POA: Diagnosis not present

## 2021-01-16 DIAGNOSIS — E119 Type 2 diabetes mellitus without complications: Secondary | ICD-10-CM | POA: Diagnosis not present

## 2021-01-16 DIAGNOSIS — Z7902 Long term (current) use of antithrombotics/antiplatelets: Secondary | ICD-10-CM | POA: Diagnosis not present

## 2021-01-16 DIAGNOSIS — Z7984 Long term (current) use of oral hypoglycemic drugs: Secondary | ICD-10-CM | POA: Diagnosis not present

## 2021-01-16 DIAGNOSIS — J45909 Unspecified asthma, uncomplicated: Secondary | ICD-10-CM | POA: Diagnosis not present

## 2021-01-16 DIAGNOSIS — I4891 Unspecified atrial fibrillation: Secondary | ICD-10-CM | POA: Diagnosis not present

## 2021-01-16 DIAGNOSIS — Z8673 Personal history of transient ischemic attack (TIA), and cerebral infarction without residual deficits: Secondary | ICD-10-CM | POA: Diagnosis not present

## 2021-01-16 DIAGNOSIS — E039 Hypothyroidism, unspecified: Secondary | ICD-10-CM | POA: Diagnosis not present

## 2021-01-16 DIAGNOSIS — S32592D Other specified fracture of left pubis, subsequent encounter for fracture with routine healing: Secondary | ICD-10-CM | POA: Diagnosis not present

## 2021-01-16 DIAGNOSIS — K219 Gastro-esophageal reflux disease without esophagitis: Secondary | ICD-10-CM | POA: Diagnosis not present

## 2021-01-16 DIAGNOSIS — I10 Essential (primary) hypertension: Secondary | ICD-10-CM | POA: Diagnosis not present

## 2021-01-16 DIAGNOSIS — M1990 Unspecified osteoarthritis, unspecified site: Secondary | ICD-10-CM | POA: Diagnosis not present

## 2021-01-16 DIAGNOSIS — Z87440 Personal history of urinary (tract) infections: Secondary | ICD-10-CM | POA: Diagnosis not present

## 2021-01-16 DIAGNOSIS — I252 Old myocardial infarction: Secondary | ICD-10-CM | POA: Diagnosis not present

## 2021-01-16 DIAGNOSIS — S3210XD Unspecified fracture of sacrum, subsequent encounter for fracture with routine healing: Secondary | ICD-10-CM | POA: Diagnosis not present

## 2021-01-16 DIAGNOSIS — E7800 Pure hypercholesterolemia, unspecified: Secondary | ICD-10-CM | POA: Diagnosis not present

## 2021-01-16 DIAGNOSIS — Z9181 History of falling: Secondary | ICD-10-CM | POA: Diagnosis not present

## 2021-01-16 DIAGNOSIS — I2510 Atherosclerotic heart disease of native coronary artery without angina pectoris: Secondary | ICD-10-CM | POA: Diagnosis not present

## 2021-01-16 DIAGNOSIS — K5790 Diverticulosis of intestine, part unspecified, without perforation or abscess without bleeding: Secondary | ICD-10-CM | POA: Diagnosis not present

## 2021-01-16 DIAGNOSIS — Z7901 Long term (current) use of anticoagulants: Secondary | ICD-10-CM | POA: Diagnosis not present

## 2021-01-17 DIAGNOSIS — I10 Essential (primary) hypertension: Secondary | ICD-10-CM | POA: Diagnosis not present

## 2021-01-17 DIAGNOSIS — M7989 Other specified soft tissue disorders: Secondary | ICD-10-CM | POA: Diagnosis not present

## 2021-01-17 DIAGNOSIS — Z6820 Body mass index (BMI) 20.0-20.9, adult: Secondary | ICD-10-CM | POA: Diagnosis not present

## 2021-01-17 DIAGNOSIS — Y92009 Unspecified place in unspecified non-institutional (private) residence as the place of occurrence of the external cause: Secondary | ICD-10-CM | POA: Diagnosis not present

## 2021-01-17 DIAGNOSIS — W19XXXA Unspecified fall, initial encounter: Secondary | ICD-10-CM | POA: Diagnosis not present

## 2021-01-17 DIAGNOSIS — S3210XA Unspecified fracture of sacrum, initial encounter for closed fracture: Secondary | ICD-10-CM | POA: Diagnosis not present

## 2021-01-18 DIAGNOSIS — E119 Type 2 diabetes mellitus without complications: Secondary | ICD-10-CM | POA: Diagnosis not present

## 2021-01-18 DIAGNOSIS — Z7901 Long term (current) use of anticoagulants: Secondary | ICD-10-CM | POA: Diagnosis not present

## 2021-01-18 DIAGNOSIS — J45909 Unspecified asthma, uncomplicated: Secondary | ICD-10-CM | POA: Diagnosis not present

## 2021-01-18 DIAGNOSIS — E039 Hypothyroidism, unspecified: Secondary | ICD-10-CM | POA: Diagnosis not present

## 2021-01-18 DIAGNOSIS — I10 Essential (primary) hypertension: Secondary | ICD-10-CM | POA: Diagnosis not present

## 2021-01-18 DIAGNOSIS — I4891 Unspecified atrial fibrillation: Secondary | ICD-10-CM | POA: Diagnosis not present

## 2021-01-18 DIAGNOSIS — K5790 Diverticulosis of intestine, part unspecified, without perforation or abscess without bleeding: Secondary | ICD-10-CM | POA: Diagnosis not present

## 2021-01-18 DIAGNOSIS — K219 Gastro-esophageal reflux disease without esophagitis: Secondary | ICD-10-CM | POA: Diagnosis not present

## 2021-01-18 DIAGNOSIS — S3210XD Unspecified fracture of sacrum, subsequent encounter for fracture with routine healing: Secondary | ICD-10-CM | POA: Diagnosis not present

## 2021-01-18 DIAGNOSIS — M1990 Unspecified osteoarthritis, unspecified site: Secondary | ICD-10-CM | POA: Diagnosis not present

## 2021-01-18 DIAGNOSIS — E7800 Pure hypercholesterolemia, unspecified: Secondary | ICD-10-CM | POA: Diagnosis not present

## 2021-01-18 DIAGNOSIS — I252 Old myocardial infarction: Secondary | ICD-10-CM | POA: Diagnosis not present

## 2021-01-18 DIAGNOSIS — I2510 Atherosclerotic heart disease of native coronary artery without angina pectoris: Secondary | ICD-10-CM | POA: Diagnosis not present

## 2021-01-18 DIAGNOSIS — Z8673 Personal history of transient ischemic attack (TIA), and cerebral infarction without residual deficits: Secondary | ICD-10-CM | POA: Diagnosis not present

## 2021-01-18 DIAGNOSIS — Z87440 Personal history of urinary (tract) infections: Secondary | ICD-10-CM | POA: Diagnosis not present

## 2021-01-18 DIAGNOSIS — Z7984 Long term (current) use of oral hypoglycemic drugs: Secondary | ICD-10-CM | POA: Diagnosis not present

## 2021-01-18 DIAGNOSIS — S32592D Other specified fracture of left pubis, subsequent encounter for fracture with routine healing: Secondary | ICD-10-CM | POA: Diagnosis not present

## 2021-01-18 DIAGNOSIS — Z9181 History of falling: Secondary | ICD-10-CM | POA: Diagnosis not present

## 2021-01-18 DIAGNOSIS — Z7902 Long term (current) use of antithrombotics/antiplatelets: Secondary | ICD-10-CM | POA: Diagnosis not present

## 2021-01-22 DIAGNOSIS — Z7984 Long term (current) use of oral hypoglycemic drugs: Secondary | ICD-10-CM | POA: Diagnosis not present

## 2021-01-22 DIAGNOSIS — M1990 Unspecified osteoarthritis, unspecified site: Secondary | ICD-10-CM | POA: Diagnosis not present

## 2021-01-22 DIAGNOSIS — I10 Essential (primary) hypertension: Secondary | ICD-10-CM | POA: Diagnosis not present

## 2021-01-22 DIAGNOSIS — K5790 Diverticulosis of intestine, part unspecified, without perforation or abscess without bleeding: Secondary | ICD-10-CM | POA: Diagnosis not present

## 2021-01-22 DIAGNOSIS — E119 Type 2 diabetes mellitus without complications: Secondary | ICD-10-CM | POA: Diagnosis not present

## 2021-01-22 DIAGNOSIS — I4891 Unspecified atrial fibrillation: Secondary | ICD-10-CM | POA: Diagnosis not present

## 2021-01-22 DIAGNOSIS — E039 Hypothyroidism, unspecified: Secondary | ICD-10-CM | POA: Diagnosis not present

## 2021-01-22 DIAGNOSIS — Z7902 Long term (current) use of antithrombotics/antiplatelets: Secondary | ICD-10-CM | POA: Diagnosis not present

## 2021-01-22 DIAGNOSIS — I252 Old myocardial infarction: Secondary | ICD-10-CM | POA: Diagnosis not present

## 2021-01-22 DIAGNOSIS — Z9181 History of falling: Secondary | ICD-10-CM | POA: Diagnosis not present

## 2021-01-22 DIAGNOSIS — I2510 Atherosclerotic heart disease of native coronary artery without angina pectoris: Secondary | ICD-10-CM | POA: Diagnosis not present

## 2021-01-22 DIAGNOSIS — Z87440 Personal history of urinary (tract) infections: Secondary | ICD-10-CM | POA: Diagnosis not present

## 2021-01-22 DIAGNOSIS — E7800 Pure hypercholesterolemia, unspecified: Secondary | ICD-10-CM | POA: Diagnosis not present

## 2021-01-22 DIAGNOSIS — S3210XD Unspecified fracture of sacrum, subsequent encounter for fracture with routine healing: Secondary | ICD-10-CM | POA: Diagnosis not present

## 2021-01-22 DIAGNOSIS — Z8673 Personal history of transient ischemic attack (TIA), and cerebral infarction without residual deficits: Secondary | ICD-10-CM | POA: Diagnosis not present

## 2021-01-22 DIAGNOSIS — S32592D Other specified fracture of left pubis, subsequent encounter for fracture with routine healing: Secondary | ICD-10-CM | POA: Diagnosis not present

## 2021-01-22 DIAGNOSIS — K219 Gastro-esophageal reflux disease without esophagitis: Secondary | ICD-10-CM | POA: Diagnosis not present

## 2021-01-22 DIAGNOSIS — Z7901 Long term (current) use of anticoagulants: Secondary | ICD-10-CM | POA: Diagnosis not present

## 2021-01-22 DIAGNOSIS — J45909 Unspecified asthma, uncomplicated: Secondary | ICD-10-CM | POA: Diagnosis not present

## 2021-01-24 DIAGNOSIS — K5790 Diverticulosis of intestine, part unspecified, without perforation or abscess without bleeding: Secondary | ICD-10-CM | POA: Diagnosis not present

## 2021-01-24 DIAGNOSIS — Z7901 Long term (current) use of anticoagulants: Secondary | ICD-10-CM | POA: Diagnosis not present

## 2021-01-24 DIAGNOSIS — S3210XD Unspecified fracture of sacrum, subsequent encounter for fracture with routine healing: Secondary | ICD-10-CM | POA: Diagnosis not present

## 2021-01-24 DIAGNOSIS — J45909 Unspecified asthma, uncomplicated: Secondary | ICD-10-CM | POA: Diagnosis not present

## 2021-01-24 DIAGNOSIS — Z9181 History of falling: Secondary | ICD-10-CM | POA: Diagnosis not present

## 2021-01-24 DIAGNOSIS — Z87440 Personal history of urinary (tract) infections: Secondary | ICD-10-CM | POA: Diagnosis not present

## 2021-01-24 DIAGNOSIS — K219 Gastro-esophageal reflux disease without esophagitis: Secondary | ICD-10-CM | POA: Diagnosis not present

## 2021-01-24 DIAGNOSIS — E039 Hypothyroidism, unspecified: Secondary | ICD-10-CM | POA: Diagnosis not present

## 2021-01-24 DIAGNOSIS — E7800 Pure hypercholesterolemia, unspecified: Secondary | ICD-10-CM | POA: Diagnosis not present

## 2021-01-24 DIAGNOSIS — I10 Essential (primary) hypertension: Secondary | ICD-10-CM | POA: Diagnosis not present

## 2021-01-24 DIAGNOSIS — E119 Type 2 diabetes mellitus without complications: Secondary | ICD-10-CM | POA: Diagnosis not present

## 2021-01-24 DIAGNOSIS — I252 Old myocardial infarction: Secondary | ICD-10-CM | POA: Diagnosis not present

## 2021-01-24 DIAGNOSIS — Z7984 Long term (current) use of oral hypoglycemic drugs: Secondary | ICD-10-CM | POA: Diagnosis not present

## 2021-01-24 DIAGNOSIS — S32592D Other specified fracture of left pubis, subsequent encounter for fracture with routine healing: Secondary | ICD-10-CM | POA: Diagnosis not present

## 2021-01-24 DIAGNOSIS — I2510 Atherosclerotic heart disease of native coronary artery without angina pectoris: Secondary | ICD-10-CM | POA: Diagnosis not present

## 2021-01-24 DIAGNOSIS — I4891 Unspecified atrial fibrillation: Secondary | ICD-10-CM | POA: Diagnosis not present

## 2021-01-24 DIAGNOSIS — M1990 Unspecified osteoarthritis, unspecified site: Secondary | ICD-10-CM | POA: Diagnosis not present

## 2021-01-24 DIAGNOSIS — Z8673 Personal history of transient ischemic attack (TIA), and cerebral infarction without residual deficits: Secondary | ICD-10-CM | POA: Diagnosis not present

## 2021-01-24 DIAGNOSIS — Z7902 Long term (current) use of antithrombotics/antiplatelets: Secondary | ICD-10-CM | POA: Diagnosis not present

## 2021-01-30 DIAGNOSIS — E7800 Pure hypercholesterolemia, unspecified: Secondary | ICD-10-CM | POA: Diagnosis not present

## 2021-01-30 DIAGNOSIS — Z87440 Personal history of urinary (tract) infections: Secondary | ICD-10-CM | POA: Diagnosis not present

## 2021-01-30 DIAGNOSIS — Z8673 Personal history of transient ischemic attack (TIA), and cerebral infarction without residual deficits: Secondary | ICD-10-CM | POA: Diagnosis not present

## 2021-01-30 DIAGNOSIS — K219 Gastro-esophageal reflux disease without esophagitis: Secondary | ICD-10-CM | POA: Diagnosis not present

## 2021-01-30 DIAGNOSIS — Z9181 History of falling: Secondary | ICD-10-CM | POA: Diagnosis not present

## 2021-01-30 DIAGNOSIS — E119 Type 2 diabetes mellitus without complications: Secondary | ICD-10-CM | POA: Diagnosis not present

## 2021-01-30 DIAGNOSIS — S32592D Other specified fracture of left pubis, subsequent encounter for fracture with routine healing: Secondary | ICD-10-CM | POA: Diagnosis not present

## 2021-01-30 DIAGNOSIS — Z7902 Long term (current) use of antithrombotics/antiplatelets: Secondary | ICD-10-CM | POA: Diagnosis not present

## 2021-01-30 DIAGNOSIS — E039 Hypothyroidism, unspecified: Secondary | ICD-10-CM | POA: Diagnosis not present

## 2021-01-30 DIAGNOSIS — K5790 Diverticulosis of intestine, part unspecified, without perforation or abscess without bleeding: Secondary | ICD-10-CM | POA: Diagnosis not present

## 2021-01-30 DIAGNOSIS — I2510 Atherosclerotic heart disease of native coronary artery without angina pectoris: Secondary | ICD-10-CM | POA: Diagnosis not present

## 2021-01-30 DIAGNOSIS — I10 Essential (primary) hypertension: Secondary | ICD-10-CM | POA: Diagnosis not present

## 2021-01-30 DIAGNOSIS — J45909 Unspecified asthma, uncomplicated: Secondary | ICD-10-CM | POA: Diagnosis not present

## 2021-01-30 DIAGNOSIS — Z7901 Long term (current) use of anticoagulants: Secondary | ICD-10-CM | POA: Diagnosis not present

## 2021-01-30 DIAGNOSIS — Z7984 Long term (current) use of oral hypoglycemic drugs: Secondary | ICD-10-CM | POA: Diagnosis not present

## 2021-01-30 DIAGNOSIS — M1990 Unspecified osteoarthritis, unspecified site: Secondary | ICD-10-CM | POA: Diagnosis not present

## 2021-01-30 DIAGNOSIS — I252 Old myocardial infarction: Secondary | ICD-10-CM | POA: Diagnosis not present

## 2021-01-30 DIAGNOSIS — I4891 Unspecified atrial fibrillation: Secondary | ICD-10-CM | POA: Diagnosis not present

## 2021-01-30 DIAGNOSIS — S3210XD Unspecified fracture of sacrum, subsequent encounter for fracture with routine healing: Secondary | ICD-10-CM | POA: Diagnosis not present

## 2021-02-02 DIAGNOSIS — S3210XD Unspecified fracture of sacrum, subsequent encounter for fracture with routine healing: Secondary | ICD-10-CM | POA: Diagnosis not present

## 2021-02-02 DIAGNOSIS — J45909 Unspecified asthma, uncomplicated: Secondary | ICD-10-CM | POA: Diagnosis not present

## 2021-02-02 DIAGNOSIS — K219 Gastro-esophageal reflux disease without esophagitis: Secondary | ICD-10-CM | POA: Diagnosis not present

## 2021-02-02 DIAGNOSIS — Z9181 History of falling: Secondary | ICD-10-CM | POA: Diagnosis not present

## 2021-02-02 DIAGNOSIS — I10 Essential (primary) hypertension: Secondary | ICD-10-CM | POA: Diagnosis not present

## 2021-02-02 DIAGNOSIS — I252 Old myocardial infarction: Secondary | ICD-10-CM | POA: Diagnosis not present

## 2021-02-02 DIAGNOSIS — Z8673 Personal history of transient ischemic attack (TIA), and cerebral infarction without residual deficits: Secondary | ICD-10-CM | POA: Diagnosis not present

## 2021-02-02 DIAGNOSIS — M1990 Unspecified osteoarthritis, unspecified site: Secondary | ICD-10-CM | POA: Diagnosis not present

## 2021-02-02 DIAGNOSIS — I2510 Atherosclerotic heart disease of native coronary artery without angina pectoris: Secondary | ICD-10-CM | POA: Diagnosis not present

## 2021-02-02 DIAGNOSIS — E7800 Pure hypercholesterolemia, unspecified: Secondary | ICD-10-CM | POA: Diagnosis not present

## 2021-02-02 DIAGNOSIS — S32592D Other specified fracture of left pubis, subsequent encounter for fracture with routine healing: Secondary | ICD-10-CM | POA: Diagnosis not present

## 2021-02-02 DIAGNOSIS — E039 Hypothyroidism, unspecified: Secondary | ICD-10-CM | POA: Diagnosis not present

## 2021-02-02 DIAGNOSIS — Z87440 Personal history of urinary (tract) infections: Secondary | ICD-10-CM | POA: Diagnosis not present

## 2021-02-02 DIAGNOSIS — K5790 Diverticulosis of intestine, part unspecified, without perforation or abscess without bleeding: Secondary | ICD-10-CM | POA: Diagnosis not present

## 2021-02-02 DIAGNOSIS — Z7901 Long term (current) use of anticoagulants: Secondary | ICD-10-CM | POA: Diagnosis not present

## 2021-02-02 DIAGNOSIS — Z7902 Long term (current) use of antithrombotics/antiplatelets: Secondary | ICD-10-CM | POA: Diagnosis not present

## 2021-02-02 DIAGNOSIS — E119 Type 2 diabetes mellitus without complications: Secondary | ICD-10-CM | POA: Diagnosis not present

## 2021-02-02 DIAGNOSIS — I4891 Unspecified atrial fibrillation: Secondary | ICD-10-CM | POA: Diagnosis not present

## 2021-02-02 DIAGNOSIS — Z7984 Long term (current) use of oral hypoglycemic drugs: Secondary | ICD-10-CM | POA: Diagnosis not present

## 2021-02-09 DIAGNOSIS — I2510 Atherosclerotic heart disease of native coronary artery without angina pectoris: Secondary | ICD-10-CM | POA: Diagnosis not present

## 2021-02-09 DIAGNOSIS — E7800 Pure hypercholesterolemia, unspecified: Secondary | ICD-10-CM | POA: Diagnosis not present

## 2021-02-09 DIAGNOSIS — Z7901 Long term (current) use of anticoagulants: Secondary | ICD-10-CM | POA: Diagnosis not present

## 2021-02-09 DIAGNOSIS — E039 Hypothyroidism, unspecified: Secondary | ICD-10-CM | POA: Diagnosis not present

## 2021-02-09 DIAGNOSIS — Z87440 Personal history of urinary (tract) infections: Secondary | ICD-10-CM | POA: Diagnosis not present

## 2021-02-09 DIAGNOSIS — Z8673 Personal history of transient ischemic attack (TIA), and cerebral infarction without residual deficits: Secondary | ICD-10-CM | POA: Diagnosis not present

## 2021-02-09 DIAGNOSIS — I4891 Unspecified atrial fibrillation: Secondary | ICD-10-CM | POA: Diagnosis not present

## 2021-02-09 DIAGNOSIS — K219 Gastro-esophageal reflux disease without esophagitis: Secondary | ICD-10-CM | POA: Diagnosis not present

## 2021-02-09 DIAGNOSIS — E119 Type 2 diabetes mellitus without complications: Secondary | ICD-10-CM | POA: Diagnosis not present

## 2021-02-09 DIAGNOSIS — I252 Old myocardial infarction: Secondary | ICD-10-CM | POA: Diagnosis not present

## 2021-02-09 DIAGNOSIS — S32592D Other specified fracture of left pubis, subsequent encounter for fracture with routine healing: Secondary | ICD-10-CM | POA: Diagnosis not present

## 2021-02-09 DIAGNOSIS — Z7902 Long term (current) use of antithrombotics/antiplatelets: Secondary | ICD-10-CM | POA: Diagnosis not present

## 2021-02-09 DIAGNOSIS — S3210XD Unspecified fracture of sacrum, subsequent encounter for fracture with routine healing: Secondary | ICD-10-CM | POA: Diagnosis not present

## 2021-02-09 DIAGNOSIS — J45909 Unspecified asthma, uncomplicated: Secondary | ICD-10-CM | POA: Diagnosis not present

## 2021-02-09 DIAGNOSIS — E782 Mixed hyperlipidemia: Secondary | ICD-10-CM | POA: Diagnosis not present

## 2021-02-09 DIAGNOSIS — I10 Essential (primary) hypertension: Secondary | ICD-10-CM | POA: Diagnosis not present

## 2021-02-09 DIAGNOSIS — K5790 Diverticulosis of intestine, part unspecified, without perforation or abscess without bleeding: Secondary | ICD-10-CM | POA: Diagnosis not present

## 2021-02-09 DIAGNOSIS — E1140 Type 2 diabetes mellitus with diabetic neuropathy, unspecified: Secondary | ICD-10-CM | POA: Diagnosis not present

## 2021-02-09 DIAGNOSIS — Z7984 Long term (current) use of oral hypoglycemic drugs: Secondary | ICD-10-CM | POA: Diagnosis not present

## 2021-02-09 DIAGNOSIS — Z9181 History of falling: Secondary | ICD-10-CM | POA: Diagnosis not present

## 2021-02-09 DIAGNOSIS — M1990 Unspecified osteoarthritis, unspecified site: Secondary | ICD-10-CM | POA: Diagnosis not present

## 2021-02-15 DIAGNOSIS — M1990 Unspecified osteoarthritis, unspecified site: Secondary | ICD-10-CM | POA: Diagnosis not present

## 2021-02-15 DIAGNOSIS — Z7902 Long term (current) use of antithrombotics/antiplatelets: Secondary | ICD-10-CM | POA: Diagnosis not present

## 2021-02-15 DIAGNOSIS — S3210XD Unspecified fracture of sacrum, subsequent encounter for fracture with routine healing: Secondary | ICD-10-CM | POA: Diagnosis not present

## 2021-02-15 DIAGNOSIS — I252 Old myocardial infarction: Secondary | ICD-10-CM | POA: Diagnosis not present

## 2021-02-15 DIAGNOSIS — Z7984 Long term (current) use of oral hypoglycemic drugs: Secondary | ICD-10-CM | POA: Diagnosis not present

## 2021-02-15 DIAGNOSIS — E039 Hypothyroidism, unspecified: Secondary | ICD-10-CM | POA: Diagnosis not present

## 2021-02-15 DIAGNOSIS — Z7901 Long term (current) use of anticoagulants: Secondary | ICD-10-CM | POA: Diagnosis not present

## 2021-02-15 DIAGNOSIS — Z8673 Personal history of transient ischemic attack (TIA), and cerebral infarction without residual deficits: Secondary | ICD-10-CM | POA: Diagnosis not present

## 2021-02-15 DIAGNOSIS — E119 Type 2 diabetes mellitus without complications: Secondary | ICD-10-CM | POA: Diagnosis not present

## 2021-02-15 DIAGNOSIS — Z9181 History of falling: Secondary | ICD-10-CM | POA: Diagnosis not present

## 2021-02-15 DIAGNOSIS — K219 Gastro-esophageal reflux disease without esophagitis: Secondary | ICD-10-CM | POA: Diagnosis not present

## 2021-02-15 DIAGNOSIS — J45909 Unspecified asthma, uncomplicated: Secondary | ICD-10-CM | POA: Diagnosis not present

## 2021-02-15 DIAGNOSIS — K5790 Diverticulosis of intestine, part unspecified, without perforation or abscess without bleeding: Secondary | ICD-10-CM | POA: Diagnosis not present

## 2021-02-15 DIAGNOSIS — E7800 Pure hypercholesterolemia, unspecified: Secondary | ICD-10-CM | POA: Diagnosis not present

## 2021-02-15 DIAGNOSIS — I2510 Atherosclerotic heart disease of native coronary artery without angina pectoris: Secondary | ICD-10-CM | POA: Diagnosis not present

## 2021-02-15 DIAGNOSIS — I10 Essential (primary) hypertension: Secondary | ICD-10-CM | POA: Diagnosis not present

## 2021-02-15 DIAGNOSIS — Z87440 Personal history of urinary (tract) infections: Secondary | ICD-10-CM | POA: Diagnosis not present

## 2021-02-15 DIAGNOSIS — S32592D Other specified fracture of left pubis, subsequent encounter for fracture with routine healing: Secondary | ICD-10-CM | POA: Diagnosis not present

## 2021-02-15 DIAGNOSIS — I4891 Unspecified atrial fibrillation: Secondary | ICD-10-CM | POA: Diagnosis not present

## 2021-02-21 DIAGNOSIS — M1990 Unspecified osteoarthritis, unspecified site: Secondary | ICD-10-CM | POA: Diagnosis not present

## 2021-02-21 DIAGNOSIS — I2510 Atherosclerotic heart disease of native coronary artery without angina pectoris: Secondary | ICD-10-CM | POA: Diagnosis not present

## 2021-02-21 DIAGNOSIS — Z7901 Long term (current) use of anticoagulants: Secondary | ICD-10-CM | POA: Diagnosis not present

## 2021-02-21 DIAGNOSIS — E7800 Pure hypercholesterolemia, unspecified: Secondary | ICD-10-CM | POA: Diagnosis not present

## 2021-02-21 DIAGNOSIS — Z7902 Long term (current) use of antithrombotics/antiplatelets: Secondary | ICD-10-CM | POA: Diagnosis not present

## 2021-02-21 DIAGNOSIS — Z8673 Personal history of transient ischemic attack (TIA), and cerebral infarction without residual deficits: Secondary | ICD-10-CM | POA: Diagnosis not present

## 2021-02-21 DIAGNOSIS — K219 Gastro-esophageal reflux disease without esophagitis: Secondary | ICD-10-CM | POA: Diagnosis not present

## 2021-02-21 DIAGNOSIS — Z9181 History of falling: Secondary | ICD-10-CM | POA: Diagnosis not present

## 2021-02-21 DIAGNOSIS — Z87440 Personal history of urinary (tract) infections: Secondary | ICD-10-CM | POA: Diagnosis not present

## 2021-02-21 DIAGNOSIS — J45909 Unspecified asthma, uncomplicated: Secondary | ICD-10-CM | POA: Diagnosis not present

## 2021-02-21 DIAGNOSIS — Z7984 Long term (current) use of oral hypoglycemic drugs: Secondary | ICD-10-CM | POA: Diagnosis not present

## 2021-02-21 DIAGNOSIS — S32592D Other specified fracture of left pubis, subsequent encounter for fracture with routine healing: Secondary | ICD-10-CM | POA: Diagnosis not present

## 2021-02-21 DIAGNOSIS — I4891 Unspecified atrial fibrillation: Secondary | ICD-10-CM | POA: Diagnosis not present

## 2021-02-21 DIAGNOSIS — E119 Type 2 diabetes mellitus without complications: Secondary | ICD-10-CM | POA: Diagnosis not present

## 2021-02-21 DIAGNOSIS — I252 Old myocardial infarction: Secondary | ICD-10-CM | POA: Diagnosis not present

## 2021-02-21 DIAGNOSIS — S3210XD Unspecified fracture of sacrum, subsequent encounter for fracture with routine healing: Secondary | ICD-10-CM | POA: Diagnosis not present

## 2021-02-21 DIAGNOSIS — E039 Hypothyroidism, unspecified: Secondary | ICD-10-CM | POA: Diagnosis not present

## 2021-02-21 DIAGNOSIS — I10 Essential (primary) hypertension: Secondary | ICD-10-CM | POA: Diagnosis not present

## 2021-02-21 DIAGNOSIS — K5790 Diverticulosis of intestine, part unspecified, without perforation or abscess without bleeding: Secondary | ICD-10-CM | POA: Diagnosis not present

## 2021-02-28 DIAGNOSIS — K219 Gastro-esophageal reflux disease without esophagitis: Secondary | ICD-10-CM | POA: Diagnosis not present

## 2021-02-28 DIAGNOSIS — J45909 Unspecified asthma, uncomplicated: Secondary | ICD-10-CM | POA: Diagnosis not present

## 2021-02-28 DIAGNOSIS — E119 Type 2 diabetes mellitus without complications: Secondary | ICD-10-CM | POA: Diagnosis not present

## 2021-02-28 DIAGNOSIS — Z8673 Personal history of transient ischemic attack (TIA), and cerebral infarction without residual deficits: Secondary | ICD-10-CM | POA: Diagnosis not present

## 2021-02-28 DIAGNOSIS — Z7984 Long term (current) use of oral hypoglycemic drugs: Secondary | ICD-10-CM | POA: Diagnosis not present

## 2021-02-28 DIAGNOSIS — I10 Essential (primary) hypertension: Secondary | ICD-10-CM | POA: Diagnosis not present

## 2021-02-28 DIAGNOSIS — Z87440 Personal history of urinary (tract) infections: Secondary | ICD-10-CM | POA: Diagnosis not present

## 2021-02-28 DIAGNOSIS — S3210XD Unspecified fracture of sacrum, subsequent encounter for fracture with routine healing: Secondary | ICD-10-CM | POA: Diagnosis not present

## 2021-02-28 DIAGNOSIS — I2510 Atherosclerotic heart disease of native coronary artery without angina pectoris: Secondary | ICD-10-CM | POA: Diagnosis not present

## 2021-02-28 DIAGNOSIS — E7800 Pure hypercholesterolemia, unspecified: Secondary | ICD-10-CM | POA: Diagnosis not present

## 2021-02-28 DIAGNOSIS — E039 Hypothyroidism, unspecified: Secondary | ICD-10-CM | POA: Diagnosis not present

## 2021-02-28 DIAGNOSIS — S32592D Other specified fracture of left pubis, subsequent encounter for fracture with routine healing: Secondary | ICD-10-CM | POA: Diagnosis not present

## 2021-02-28 DIAGNOSIS — K5790 Diverticulosis of intestine, part unspecified, without perforation or abscess without bleeding: Secondary | ICD-10-CM | POA: Diagnosis not present

## 2021-02-28 DIAGNOSIS — Z7901 Long term (current) use of anticoagulants: Secondary | ICD-10-CM | POA: Diagnosis not present

## 2021-02-28 DIAGNOSIS — Z9181 History of falling: Secondary | ICD-10-CM | POA: Diagnosis not present

## 2021-02-28 DIAGNOSIS — I252 Old myocardial infarction: Secondary | ICD-10-CM | POA: Diagnosis not present

## 2021-02-28 DIAGNOSIS — M1990 Unspecified osteoarthritis, unspecified site: Secondary | ICD-10-CM | POA: Diagnosis not present

## 2021-02-28 DIAGNOSIS — I4891 Unspecified atrial fibrillation: Secondary | ICD-10-CM | POA: Diagnosis not present

## 2021-02-28 DIAGNOSIS — Z7902 Long term (current) use of antithrombotics/antiplatelets: Secondary | ICD-10-CM | POA: Diagnosis not present

## 2021-03-08 DIAGNOSIS — I10 Essential (primary) hypertension: Secondary | ICD-10-CM | POA: Diagnosis not present

## 2021-03-08 DIAGNOSIS — M1990 Unspecified osteoarthritis, unspecified site: Secondary | ICD-10-CM | POA: Diagnosis not present

## 2021-03-08 DIAGNOSIS — Z7984 Long term (current) use of oral hypoglycemic drugs: Secondary | ICD-10-CM | POA: Diagnosis not present

## 2021-03-08 DIAGNOSIS — Z7902 Long term (current) use of antithrombotics/antiplatelets: Secondary | ICD-10-CM | POA: Diagnosis not present

## 2021-03-08 DIAGNOSIS — I4891 Unspecified atrial fibrillation: Secondary | ICD-10-CM | POA: Diagnosis not present

## 2021-03-08 DIAGNOSIS — E7800 Pure hypercholesterolemia, unspecified: Secondary | ICD-10-CM | POA: Diagnosis not present

## 2021-03-08 DIAGNOSIS — I252 Old myocardial infarction: Secondary | ICD-10-CM | POA: Diagnosis not present

## 2021-03-08 DIAGNOSIS — Z87440 Personal history of urinary (tract) infections: Secondary | ICD-10-CM | POA: Diagnosis not present

## 2021-03-08 DIAGNOSIS — J45909 Unspecified asthma, uncomplicated: Secondary | ICD-10-CM | POA: Diagnosis not present

## 2021-03-08 DIAGNOSIS — S3210XD Unspecified fracture of sacrum, subsequent encounter for fracture with routine healing: Secondary | ICD-10-CM | POA: Diagnosis not present

## 2021-03-08 DIAGNOSIS — K219 Gastro-esophageal reflux disease without esophagitis: Secondary | ICD-10-CM | POA: Diagnosis not present

## 2021-03-08 DIAGNOSIS — Z8673 Personal history of transient ischemic attack (TIA), and cerebral infarction without residual deficits: Secondary | ICD-10-CM | POA: Diagnosis not present

## 2021-03-08 DIAGNOSIS — E039 Hypothyroidism, unspecified: Secondary | ICD-10-CM | POA: Diagnosis not present

## 2021-03-08 DIAGNOSIS — K5790 Diverticulosis of intestine, part unspecified, without perforation or abscess without bleeding: Secondary | ICD-10-CM | POA: Diagnosis not present

## 2021-03-08 DIAGNOSIS — E119 Type 2 diabetes mellitus without complications: Secondary | ICD-10-CM | POA: Diagnosis not present

## 2021-03-08 DIAGNOSIS — Z7901 Long term (current) use of anticoagulants: Secondary | ICD-10-CM | POA: Diagnosis not present

## 2021-03-08 DIAGNOSIS — Z9181 History of falling: Secondary | ICD-10-CM | POA: Diagnosis not present

## 2021-03-08 DIAGNOSIS — S32592D Other specified fracture of left pubis, subsequent encounter for fracture with routine healing: Secondary | ICD-10-CM | POA: Diagnosis not present

## 2021-03-08 DIAGNOSIS — I2510 Atherosclerotic heart disease of native coronary artery without angina pectoris: Secondary | ICD-10-CM | POA: Diagnosis not present

## 2021-03-12 DIAGNOSIS — Z79899 Other long term (current) drug therapy: Secondary | ICD-10-CM | POA: Diagnosis not present

## 2021-03-12 DIAGNOSIS — M7989 Other specified soft tissue disorders: Secondary | ICD-10-CM | POA: Diagnosis not present

## 2021-03-12 DIAGNOSIS — E1140 Type 2 diabetes mellitus with diabetic neuropathy, unspecified: Secondary | ICD-10-CM | POA: Diagnosis not present

## 2021-03-12 DIAGNOSIS — E559 Vitamin D deficiency, unspecified: Secondary | ICD-10-CM | POA: Diagnosis not present

## 2021-03-12 DIAGNOSIS — Z6820 Body mass index (BMI) 20.0-20.9, adult: Secondary | ICD-10-CM | POA: Diagnosis not present

## 2021-03-12 DIAGNOSIS — E782 Mixed hyperlipidemia: Secondary | ICD-10-CM | POA: Diagnosis not present

## 2021-03-12 DIAGNOSIS — I10 Essential (primary) hypertension: Secondary | ICD-10-CM | POA: Diagnosis not present

## 2021-03-12 DIAGNOSIS — R600 Localized edema: Secondary | ICD-10-CM | POA: Diagnosis not present

## 2021-03-12 DIAGNOSIS — E039 Hypothyroidism, unspecified: Secondary | ICD-10-CM | POA: Diagnosis not present

## 2021-03-13 DIAGNOSIS — E1140 Type 2 diabetes mellitus with diabetic neuropathy, unspecified: Secondary | ICD-10-CM | POA: Diagnosis not present

## 2021-03-13 DIAGNOSIS — E559 Vitamin D deficiency, unspecified: Secondary | ICD-10-CM | POA: Diagnosis not present

## 2021-03-13 DIAGNOSIS — E782 Mixed hyperlipidemia: Secondary | ICD-10-CM | POA: Diagnosis not present

## 2021-03-13 DIAGNOSIS — E039 Hypothyroidism, unspecified: Secondary | ICD-10-CM | POA: Diagnosis not present

## 2021-03-13 DIAGNOSIS — Z79899 Other long term (current) drug therapy: Secondary | ICD-10-CM | POA: Diagnosis not present

## 2021-03-14 DIAGNOSIS — Z79899 Other long term (current) drug therapy: Secondary | ICD-10-CM | POA: Diagnosis not present

## 2021-04-10 DIAGNOSIS — I10 Essential (primary) hypertension: Secondary | ICD-10-CM | POA: Diagnosis not present

## 2021-04-10 DIAGNOSIS — E782 Mixed hyperlipidemia: Secondary | ICD-10-CM | POA: Diagnosis not present

## 2021-04-10 DIAGNOSIS — E1142 Type 2 diabetes mellitus with diabetic polyneuropathy: Secondary | ICD-10-CM | POA: Diagnosis not present

## 2021-04-18 ENCOUNTER — Other Ambulatory Visit: Payer: Self-pay | Admitting: Cardiology

## 2021-06-06 DIAGNOSIS — Z79899 Other long term (current) drug therapy: Secondary | ICD-10-CM | POA: Diagnosis not present

## 2021-06-06 DIAGNOSIS — Z681 Body mass index (BMI) 19 or less, adult: Secondary | ICD-10-CM | POA: Diagnosis not present

## 2021-06-06 DIAGNOSIS — E039 Hypothyroidism, unspecified: Secondary | ICD-10-CM | POA: Diagnosis not present

## 2021-06-06 DIAGNOSIS — R600 Localized edema: Secondary | ICD-10-CM | POA: Diagnosis not present

## 2021-06-06 DIAGNOSIS — I10 Essential (primary) hypertension: Secondary | ICD-10-CM | POA: Diagnosis not present

## 2021-06-06 DIAGNOSIS — M7989 Other specified soft tissue disorders: Secondary | ICD-10-CM | POA: Diagnosis not present

## 2021-06-06 DIAGNOSIS — E782 Mixed hyperlipidemia: Secondary | ICD-10-CM | POA: Diagnosis not present

## 2021-06-06 DIAGNOSIS — E559 Vitamin D deficiency, unspecified: Secondary | ICD-10-CM | POA: Diagnosis not present

## 2021-06-06 DIAGNOSIS — R531 Weakness: Secondary | ICD-10-CM | POA: Diagnosis not present

## 2021-06-06 DIAGNOSIS — Z7409 Other reduced mobility: Secondary | ICD-10-CM | POA: Diagnosis not present

## 2021-06-06 DIAGNOSIS — E1140 Type 2 diabetes mellitus with diabetic neuropathy, unspecified: Secondary | ICD-10-CM | POA: Diagnosis not present

## 2021-06-06 DIAGNOSIS — R6889 Other general symptoms and signs: Secondary | ICD-10-CM | POA: Diagnosis not present

## 2021-06-10 DIAGNOSIS — E782 Mixed hyperlipidemia: Secondary | ICD-10-CM | POA: Diagnosis not present

## 2021-06-10 DIAGNOSIS — E1140 Type 2 diabetes mellitus with diabetic neuropathy, unspecified: Secondary | ICD-10-CM | POA: Diagnosis not present

## 2021-06-10 DIAGNOSIS — I10 Essential (primary) hypertension: Secondary | ICD-10-CM | POA: Diagnosis not present

## 2021-06-11 DIAGNOSIS — R531 Weakness: Secondary | ICD-10-CM | POA: Diagnosis not present

## 2021-06-20 ENCOUNTER — Other Ambulatory Visit: Payer: Self-pay | Admitting: Cardiology

## 2021-07-04 DIAGNOSIS — Z139 Encounter for screening, unspecified: Secondary | ICD-10-CM | POA: Diagnosis not present

## 2021-07-04 DIAGNOSIS — Z9181 History of falling: Secondary | ICD-10-CM | POA: Diagnosis not present

## 2021-07-04 DIAGNOSIS — E785 Hyperlipidemia, unspecified: Secondary | ICD-10-CM | POA: Diagnosis not present

## 2021-07-04 DIAGNOSIS — Z0000 Encounter for general adult medical examination without abnormal findings: Secondary | ICD-10-CM | POA: Diagnosis not present

## 2021-08-15 ENCOUNTER — Other Ambulatory Visit: Payer: Self-pay

## 2021-08-15 ENCOUNTER — Ambulatory Visit: Payer: Medicare Other | Admitting: Cardiology

## 2021-08-23 ENCOUNTER — Ambulatory Visit: Payer: Medicare Other | Admitting: Cardiology

## 2021-08-29 DIAGNOSIS — I10 Essential (primary) hypertension: Secondary | ICD-10-CM | POA: Diagnosis not present

## 2021-08-29 DIAGNOSIS — E039 Hypothyroidism, unspecified: Secondary | ICD-10-CM | POA: Diagnosis not present

## 2021-08-29 DIAGNOSIS — R6889 Other general symptoms and signs: Secondary | ICD-10-CM | POA: Diagnosis not present

## 2021-08-29 DIAGNOSIS — R531 Weakness: Secondary | ICD-10-CM | POA: Diagnosis not present

## 2021-08-29 DIAGNOSIS — E1140 Type 2 diabetes mellitus with diabetic neuropathy, unspecified: Secondary | ICD-10-CM | POA: Diagnosis not present

## 2021-08-29 DIAGNOSIS — Z7409 Other reduced mobility: Secondary | ICD-10-CM | POA: Diagnosis not present

## 2021-08-29 DIAGNOSIS — E782 Mixed hyperlipidemia: Secondary | ICD-10-CM | POA: Diagnosis not present

## 2021-08-29 DIAGNOSIS — R2681 Unsteadiness on feet: Secondary | ICD-10-CM | POA: Diagnosis not present

## 2021-08-29 DIAGNOSIS — Z87898 Personal history of other specified conditions: Secondary | ICD-10-CM | POA: Diagnosis not present

## 2021-09-04 DIAGNOSIS — E1151 Type 2 diabetes mellitus with diabetic peripheral angiopathy without gangrene: Secondary | ICD-10-CM | POA: Diagnosis not present

## 2021-09-04 DIAGNOSIS — E782 Mixed hyperlipidemia: Secondary | ICD-10-CM | POA: Diagnosis not present

## 2021-09-04 DIAGNOSIS — M159 Polyosteoarthritis, unspecified: Secondary | ICD-10-CM | POA: Diagnosis not present

## 2021-09-04 DIAGNOSIS — J45909 Unspecified asthma, uncomplicated: Secondary | ICD-10-CM | POA: Diagnosis not present

## 2021-09-04 DIAGNOSIS — Z9181 History of falling: Secondary | ICD-10-CM | POA: Diagnosis not present

## 2021-09-04 DIAGNOSIS — Z87891 Personal history of nicotine dependence: Secondary | ICD-10-CM | POA: Diagnosis not present

## 2021-09-04 DIAGNOSIS — E559 Vitamin D deficiency, unspecified: Secondary | ICD-10-CM | POA: Diagnosis not present

## 2021-09-04 DIAGNOSIS — I480 Paroxysmal atrial fibrillation: Secondary | ICD-10-CM | POA: Diagnosis not present

## 2021-09-04 DIAGNOSIS — Z955 Presence of coronary angioplasty implant and graft: Secondary | ICD-10-CM | POA: Diagnosis not present

## 2021-09-04 DIAGNOSIS — I69393 Ataxia following cerebral infarction: Secondary | ICD-10-CM | POA: Diagnosis not present

## 2021-09-04 DIAGNOSIS — I2510 Atherosclerotic heart disease of native coronary artery without angina pectoris: Secondary | ICD-10-CM | POA: Diagnosis not present

## 2021-09-04 DIAGNOSIS — I252 Old myocardial infarction: Secondary | ICD-10-CM | POA: Diagnosis not present

## 2021-09-04 DIAGNOSIS — Z7901 Long term (current) use of anticoagulants: Secondary | ICD-10-CM | POA: Diagnosis not present

## 2021-09-04 DIAGNOSIS — Z7902 Long term (current) use of antithrombotics/antiplatelets: Secondary | ICD-10-CM | POA: Diagnosis not present

## 2021-09-04 DIAGNOSIS — K760 Fatty (change of) liver, not elsewhere classified: Secondary | ICD-10-CM | POA: Diagnosis not present

## 2021-09-04 DIAGNOSIS — E1142 Type 2 diabetes mellitus with diabetic polyneuropathy: Secondary | ICD-10-CM | POA: Diagnosis not present

## 2021-09-04 DIAGNOSIS — E039 Hypothyroidism, unspecified: Secondary | ICD-10-CM | POA: Diagnosis not present

## 2021-09-04 DIAGNOSIS — I10 Essential (primary) hypertension: Secondary | ICD-10-CM | POA: Diagnosis not present

## 2021-09-04 DIAGNOSIS — Z7984 Long term (current) use of oral hypoglycemic drugs: Secondary | ICD-10-CM | POA: Diagnosis not present

## 2021-09-06 DIAGNOSIS — E559 Vitamin D deficiency, unspecified: Secondary | ICD-10-CM | POA: Diagnosis not present

## 2021-09-06 DIAGNOSIS — Z955 Presence of coronary angioplasty implant and graft: Secondary | ICD-10-CM | POA: Diagnosis not present

## 2021-09-06 DIAGNOSIS — Z87891 Personal history of nicotine dependence: Secondary | ICD-10-CM | POA: Diagnosis not present

## 2021-09-06 DIAGNOSIS — Z7902 Long term (current) use of antithrombotics/antiplatelets: Secondary | ICD-10-CM | POA: Diagnosis not present

## 2021-09-06 DIAGNOSIS — J45909 Unspecified asthma, uncomplicated: Secondary | ICD-10-CM | POA: Diagnosis not present

## 2021-09-06 DIAGNOSIS — Z9181 History of falling: Secondary | ICD-10-CM | POA: Diagnosis not present

## 2021-09-06 DIAGNOSIS — E039 Hypothyroidism, unspecified: Secondary | ICD-10-CM | POA: Diagnosis not present

## 2021-09-06 DIAGNOSIS — I69393 Ataxia following cerebral infarction: Secondary | ICD-10-CM | POA: Diagnosis not present

## 2021-09-06 DIAGNOSIS — I10 Essential (primary) hypertension: Secondary | ICD-10-CM | POA: Diagnosis not present

## 2021-09-06 DIAGNOSIS — E1151 Type 2 diabetes mellitus with diabetic peripheral angiopathy without gangrene: Secondary | ICD-10-CM | POA: Diagnosis not present

## 2021-09-06 DIAGNOSIS — E1142 Type 2 diabetes mellitus with diabetic polyneuropathy: Secondary | ICD-10-CM | POA: Diagnosis not present

## 2021-09-06 DIAGNOSIS — E782 Mixed hyperlipidemia: Secondary | ICD-10-CM | POA: Diagnosis not present

## 2021-09-06 DIAGNOSIS — M159 Polyosteoarthritis, unspecified: Secondary | ICD-10-CM | POA: Diagnosis not present

## 2021-09-06 DIAGNOSIS — I2510 Atherosclerotic heart disease of native coronary artery without angina pectoris: Secondary | ICD-10-CM | POA: Diagnosis not present

## 2021-09-06 DIAGNOSIS — I252 Old myocardial infarction: Secondary | ICD-10-CM | POA: Diagnosis not present

## 2021-09-06 DIAGNOSIS — I480 Paroxysmal atrial fibrillation: Secondary | ICD-10-CM | POA: Diagnosis not present

## 2021-09-06 DIAGNOSIS — Z7984 Long term (current) use of oral hypoglycemic drugs: Secondary | ICD-10-CM | POA: Diagnosis not present

## 2021-09-06 DIAGNOSIS — K760 Fatty (change of) liver, not elsewhere classified: Secondary | ICD-10-CM | POA: Diagnosis not present

## 2021-09-06 DIAGNOSIS — Z7901 Long term (current) use of anticoagulants: Secondary | ICD-10-CM | POA: Diagnosis not present

## 2021-09-10 DIAGNOSIS — E782 Mixed hyperlipidemia: Secondary | ICD-10-CM | POA: Diagnosis not present

## 2021-09-10 DIAGNOSIS — J45909 Unspecified asthma, uncomplicated: Secondary | ICD-10-CM | POA: Diagnosis not present

## 2021-09-10 DIAGNOSIS — I10 Essential (primary) hypertension: Secondary | ICD-10-CM | POA: Diagnosis not present

## 2021-09-10 DIAGNOSIS — E559 Vitamin D deficiency, unspecified: Secondary | ICD-10-CM | POA: Diagnosis not present

## 2021-09-10 DIAGNOSIS — I480 Paroxysmal atrial fibrillation: Secondary | ICD-10-CM | POA: Diagnosis not present

## 2021-09-10 DIAGNOSIS — Z955 Presence of coronary angioplasty implant and graft: Secondary | ICD-10-CM | POA: Diagnosis not present

## 2021-09-10 DIAGNOSIS — M159 Polyosteoarthritis, unspecified: Secondary | ICD-10-CM | POA: Diagnosis not present

## 2021-09-10 DIAGNOSIS — E1142 Type 2 diabetes mellitus with diabetic polyneuropathy: Secondary | ICD-10-CM | POA: Diagnosis not present

## 2021-09-10 DIAGNOSIS — K760 Fatty (change of) liver, not elsewhere classified: Secondary | ICD-10-CM | POA: Diagnosis not present

## 2021-09-10 DIAGNOSIS — I2510 Atherosclerotic heart disease of native coronary artery without angina pectoris: Secondary | ICD-10-CM | POA: Diagnosis not present

## 2021-09-10 DIAGNOSIS — Z7984 Long term (current) use of oral hypoglycemic drugs: Secondary | ICD-10-CM | POA: Diagnosis not present

## 2021-09-10 DIAGNOSIS — Z7902 Long term (current) use of antithrombotics/antiplatelets: Secondary | ICD-10-CM | POA: Diagnosis not present

## 2021-09-10 DIAGNOSIS — Z87891 Personal history of nicotine dependence: Secondary | ICD-10-CM | POA: Diagnosis not present

## 2021-09-10 DIAGNOSIS — E039 Hypothyroidism, unspecified: Secondary | ICD-10-CM | POA: Diagnosis not present

## 2021-09-10 DIAGNOSIS — Z7901 Long term (current) use of anticoagulants: Secondary | ICD-10-CM | POA: Diagnosis not present

## 2021-09-10 DIAGNOSIS — E1151 Type 2 diabetes mellitus with diabetic peripheral angiopathy without gangrene: Secondary | ICD-10-CM | POA: Diagnosis not present

## 2021-09-10 DIAGNOSIS — I252 Old myocardial infarction: Secondary | ICD-10-CM | POA: Diagnosis not present

## 2021-09-10 DIAGNOSIS — Z9181 History of falling: Secondary | ICD-10-CM | POA: Diagnosis not present

## 2021-09-10 DIAGNOSIS — I69393 Ataxia following cerebral infarction: Secondary | ICD-10-CM | POA: Diagnosis not present

## 2021-09-12 DIAGNOSIS — I480 Paroxysmal atrial fibrillation: Secondary | ICD-10-CM | POA: Diagnosis not present

## 2021-09-12 DIAGNOSIS — Z7902 Long term (current) use of antithrombotics/antiplatelets: Secondary | ICD-10-CM | POA: Diagnosis not present

## 2021-09-12 DIAGNOSIS — I69393 Ataxia following cerebral infarction: Secondary | ICD-10-CM | POA: Diagnosis not present

## 2021-09-12 DIAGNOSIS — E1151 Type 2 diabetes mellitus with diabetic peripheral angiopathy without gangrene: Secondary | ICD-10-CM | POA: Diagnosis not present

## 2021-09-12 DIAGNOSIS — Z87891 Personal history of nicotine dependence: Secondary | ICD-10-CM | POA: Diagnosis not present

## 2021-09-12 DIAGNOSIS — I252 Old myocardial infarction: Secondary | ICD-10-CM | POA: Diagnosis not present

## 2021-09-12 DIAGNOSIS — J45909 Unspecified asthma, uncomplicated: Secondary | ICD-10-CM | POA: Diagnosis not present

## 2021-09-12 DIAGNOSIS — K760 Fatty (change of) liver, not elsewhere classified: Secondary | ICD-10-CM | POA: Diagnosis not present

## 2021-09-12 DIAGNOSIS — Z9181 History of falling: Secondary | ICD-10-CM | POA: Diagnosis not present

## 2021-09-12 DIAGNOSIS — M159 Polyosteoarthritis, unspecified: Secondary | ICD-10-CM | POA: Diagnosis not present

## 2021-09-12 DIAGNOSIS — E559 Vitamin D deficiency, unspecified: Secondary | ICD-10-CM | POA: Diagnosis not present

## 2021-09-12 DIAGNOSIS — I2510 Atherosclerotic heart disease of native coronary artery without angina pectoris: Secondary | ICD-10-CM | POA: Diagnosis not present

## 2021-09-12 DIAGNOSIS — E782 Mixed hyperlipidemia: Secondary | ICD-10-CM | POA: Diagnosis not present

## 2021-09-12 DIAGNOSIS — Z7984 Long term (current) use of oral hypoglycemic drugs: Secondary | ICD-10-CM | POA: Diagnosis not present

## 2021-09-12 DIAGNOSIS — Z955 Presence of coronary angioplasty implant and graft: Secondary | ICD-10-CM | POA: Diagnosis not present

## 2021-09-12 DIAGNOSIS — E1142 Type 2 diabetes mellitus with diabetic polyneuropathy: Secondary | ICD-10-CM | POA: Diagnosis not present

## 2021-09-12 DIAGNOSIS — Z7901 Long term (current) use of anticoagulants: Secondary | ICD-10-CM | POA: Diagnosis not present

## 2021-09-12 DIAGNOSIS — E039 Hypothyroidism, unspecified: Secondary | ICD-10-CM | POA: Diagnosis not present

## 2021-09-12 DIAGNOSIS — I10 Essential (primary) hypertension: Secondary | ICD-10-CM | POA: Diagnosis not present

## 2021-09-18 DIAGNOSIS — Z87891 Personal history of nicotine dependence: Secondary | ICD-10-CM | POA: Diagnosis not present

## 2021-09-18 DIAGNOSIS — K760 Fatty (change of) liver, not elsewhere classified: Secondary | ICD-10-CM | POA: Diagnosis not present

## 2021-09-18 DIAGNOSIS — E559 Vitamin D deficiency, unspecified: Secondary | ICD-10-CM | POA: Diagnosis not present

## 2021-09-18 DIAGNOSIS — Z955 Presence of coronary angioplasty implant and graft: Secondary | ICD-10-CM | POA: Diagnosis not present

## 2021-09-18 DIAGNOSIS — E1142 Type 2 diabetes mellitus with diabetic polyneuropathy: Secondary | ICD-10-CM | POA: Diagnosis not present

## 2021-09-18 DIAGNOSIS — E039 Hypothyroidism, unspecified: Secondary | ICD-10-CM | POA: Diagnosis not present

## 2021-09-18 DIAGNOSIS — J45909 Unspecified asthma, uncomplicated: Secondary | ICD-10-CM | POA: Diagnosis not present

## 2021-09-18 DIAGNOSIS — E1151 Type 2 diabetes mellitus with diabetic peripheral angiopathy without gangrene: Secondary | ICD-10-CM | POA: Diagnosis not present

## 2021-09-18 DIAGNOSIS — I10 Essential (primary) hypertension: Secondary | ICD-10-CM | POA: Diagnosis not present

## 2021-09-18 DIAGNOSIS — E782 Mixed hyperlipidemia: Secondary | ICD-10-CM | POA: Diagnosis not present

## 2021-09-18 DIAGNOSIS — I69393 Ataxia following cerebral infarction: Secondary | ICD-10-CM | POA: Diagnosis not present

## 2021-09-18 DIAGNOSIS — I480 Paroxysmal atrial fibrillation: Secondary | ICD-10-CM | POA: Diagnosis not present

## 2021-09-18 DIAGNOSIS — Z9181 History of falling: Secondary | ICD-10-CM | POA: Diagnosis not present

## 2021-09-18 DIAGNOSIS — Z7902 Long term (current) use of antithrombotics/antiplatelets: Secondary | ICD-10-CM | POA: Diagnosis not present

## 2021-09-18 DIAGNOSIS — Z7984 Long term (current) use of oral hypoglycemic drugs: Secondary | ICD-10-CM | POA: Diagnosis not present

## 2021-09-18 DIAGNOSIS — Z7901 Long term (current) use of anticoagulants: Secondary | ICD-10-CM | POA: Diagnosis not present

## 2021-09-18 DIAGNOSIS — I2510 Atherosclerotic heart disease of native coronary artery without angina pectoris: Secondary | ICD-10-CM | POA: Diagnosis not present

## 2021-09-18 DIAGNOSIS — M159 Polyosteoarthritis, unspecified: Secondary | ICD-10-CM | POA: Diagnosis not present

## 2021-09-18 DIAGNOSIS — I252 Old myocardial infarction: Secondary | ICD-10-CM | POA: Diagnosis not present

## 2021-09-20 DIAGNOSIS — I2510 Atherosclerotic heart disease of native coronary artery without angina pectoris: Secondary | ICD-10-CM | POA: Diagnosis not present

## 2021-09-20 DIAGNOSIS — I10 Essential (primary) hypertension: Secondary | ICD-10-CM | POA: Diagnosis not present

## 2021-09-20 DIAGNOSIS — Z7901 Long term (current) use of anticoagulants: Secondary | ICD-10-CM | POA: Diagnosis not present

## 2021-09-20 DIAGNOSIS — Z87891 Personal history of nicotine dependence: Secondary | ICD-10-CM | POA: Diagnosis not present

## 2021-09-20 DIAGNOSIS — Z955 Presence of coronary angioplasty implant and graft: Secondary | ICD-10-CM | POA: Diagnosis not present

## 2021-09-20 DIAGNOSIS — E1142 Type 2 diabetes mellitus with diabetic polyneuropathy: Secondary | ICD-10-CM | POA: Diagnosis not present

## 2021-09-20 DIAGNOSIS — I252 Old myocardial infarction: Secondary | ICD-10-CM | POA: Diagnosis not present

## 2021-09-20 DIAGNOSIS — E559 Vitamin D deficiency, unspecified: Secondary | ICD-10-CM | POA: Diagnosis not present

## 2021-09-20 DIAGNOSIS — K760 Fatty (change of) liver, not elsewhere classified: Secondary | ICD-10-CM | POA: Diagnosis not present

## 2021-09-20 DIAGNOSIS — J45909 Unspecified asthma, uncomplicated: Secondary | ICD-10-CM | POA: Diagnosis not present

## 2021-09-20 DIAGNOSIS — Z7984 Long term (current) use of oral hypoglycemic drugs: Secondary | ICD-10-CM | POA: Diagnosis not present

## 2021-09-20 DIAGNOSIS — E039 Hypothyroidism, unspecified: Secondary | ICD-10-CM | POA: Diagnosis not present

## 2021-09-20 DIAGNOSIS — E782 Mixed hyperlipidemia: Secondary | ICD-10-CM | POA: Diagnosis not present

## 2021-09-20 DIAGNOSIS — Z7902 Long term (current) use of antithrombotics/antiplatelets: Secondary | ICD-10-CM | POA: Diagnosis not present

## 2021-09-20 DIAGNOSIS — M159 Polyosteoarthritis, unspecified: Secondary | ICD-10-CM | POA: Diagnosis not present

## 2021-09-20 DIAGNOSIS — I480 Paroxysmal atrial fibrillation: Secondary | ICD-10-CM | POA: Diagnosis not present

## 2021-09-20 DIAGNOSIS — Z9181 History of falling: Secondary | ICD-10-CM | POA: Diagnosis not present

## 2021-09-20 DIAGNOSIS — E1151 Type 2 diabetes mellitus with diabetic peripheral angiopathy without gangrene: Secondary | ICD-10-CM | POA: Diagnosis not present

## 2021-09-20 DIAGNOSIS — I69393 Ataxia following cerebral infarction: Secondary | ICD-10-CM | POA: Diagnosis not present

## 2021-09-26 DIAGNOSIS — Z955 Presence of coronary angioplasty implant and graft: Secondary | ICD-10-CM | POA: Diagnosis not present

## 2021-09-26 DIAGNOSIS — Z7902 Long term (current) use of antithrombotics/antiplatelets: Secondary | ICD-10-CM | POA: Diagnosis not present

## 2021-09-26 DIAGNOSIS — I69393 Ataxia following cerebral infarction: Secondary | ICD-10-CM | POA: Diagnosis not present

## 2021-09-26 DIAGNOSIS — I10 Essential (primary) hypertension: Secondary | ICD-10-CM | POA: Diagnosis not present

## 2021-09-26 DIAGNOSIS — I252 Old myocardial infarction: Secondary | ICD-10-CM | POA: Diagnosis not present

## 2021-09-26 DIAGNOSIS — E782 Mixed hyperlipidemia: Secondary | ICD-10-CM | POA: Diagnosis not present

## 2021-09-26 DIAGNOSIS — Z7984 Long term (current) use of oral hypoglycemic drugs: Secondary | ICD-10-CM | POA: Diagnosis not present

## 2021-09-26 DIAGNOSIS — I480 Paroxysmal atrial fibrillation: Secondary | ICD-10-CM | POA: Diagnosis not present

## 2021-09-26 DIAGNOSIS — J45909 Unspecified asthma, uncomplicated: Secondary | ICD-10-CM | POA: Diagnosis not present

## 2021-09-26 DIAGNOSIS — M159 Polyosteoarthritis, unspecified: Secondary | ICD-10-CM | POA: Diagnosis not present

## 2021-09-26 DIAGNOSIS — E559 Vitamin D deficiency, unspecified: Secondary | ICD-10-CM | POA: Diagnosis not present

## 2021-09-26 DIAGNOSIS — K760 Fatty (change of) liver, not elsewhere classified: Secondary | ICD-10-CM | POA: Diagnosis not present

## 2021-09-26 DIAGNOSIS — Z9181 History of falling: Secondary | ICD-10-CM | POA: Diagnosis not present

## 2021-09-26 DIAGNOSIS — E039 Hypothyroidism, unspecified: Secondary | ICD-10-CM | POA: Diagnosis not present

## 2021-09-26 DIAGNOSIS — Z87891 Personal history of nicotine dependence: Secondary | ICD-10-CM | POA: Diagnosis not present

## 2021-09-26 DIAGNOSIS — I2510 Atherosclerotic heart disease of native coronary artery without angina pectoris: Secondary | ICD-10-CM | POA: Diagnosis not present

## 2021-09-26 DIAGNOSIS — E1151 Type 2 diabetes mellitus with diabetic peripheral angiopathy without gangrene: Secondary | ICD-10-CM | POA: Diagnosis not present

## 2021-09-26 DIAGNOSIS — Z7901 Long term (current) use of anticoagulants: Secondary | ICD-10-CM | POA: Diagnosis not present

## 2021-09-26 DIAGNOSIS — E1142 Type 2 diabetes mellitus with diabetic polyneuropathy: Secondary | ICD-10-CM | POA: Diagnosis not present

## 2021-09-27 ENCOUNTER — Ambulatory Visit: Payer: Medicare Other | Admitting: Cardiology

## 2021-09-27 ENCOUNTER — Encounter: Payer: Self-pay | Admitting: Cardiology

## 2021-09-27 VITALS — BP 132/70 | HR 72 | Ht 64.6 in | Wt 118.8 lb

## 2021-09-27 DIAGNOSIS — I10 Essential (primary) hypertension: Secondary | ICD-10-CM

## 2021-09-27 DIAGNOSIS — E119 Type 2 diabetes mellitus without complications: Secondary | ICD-10-CM

## 2021-09-27 DIAGNOSIS — E782 Mixed hyperlipidemia: Secondary | ICD-10-CM

## 2021-09-27 DIAGNOSIS — E039 Hypothyroidism, unspecified: Secondary | ICD-10-CM | POA: Diagnosis not present

## 2021-09-27 DIAGNOSIS — I2510 Atherosclerotic heart disease of native coronary artery without angina pectoris: Secondary | ICD-10-CM | POA: Diagnosis not present

## 2021-09-27 DIAGNOSIS — E088 Diabetes mellitus due to underlying condition with unspecified complications: Secondary | ICD-10-CM

## 2021-09-27 DIAGNOSIS — E559 Vitamin D deficiency, unspecified: Secondary | ICD-10-CM | POA: Diagnosis not present

## 2021-09-27 MED ORDER — NITROGLYCERIN 0.4 MG SL SUBL: 0.4000 mg | SUBLINGUAL_TABLET | SUBLINGUAL | 6 refills | Status: DC | PRN

## 2021-09-27 NOTE — Progress Notes (Signed)
Cardiology Office Note:    Date:  09/27/2021   ID:  [name], DOB 05-Nov-1933, MRN [id number]  PCP:  Lucianne Lei, MD  Cardiologist:  Garwin Brothers, MD   Referring MD: Lucianne Lei, MD    ASSESSMENT:    1. Coronary artery disease involving native coronary artery of native heart without angina pectoris   2. Essential hypertension   3. Mixed hyperlipidemia   4. Diabetes mellitus due to underlying condition with unspecified complications (HCC)    PLAN:    In order of problems listed above:  Coronary artery disease: Secondary prevention stressed with the patient.  Importance of compliance with diet medication stressed and she vocalized understanding.  She ambulates age appropriately.  Sublingual nitroglycerin prescription was sent, its protocol and 911 protocol explained and the patient vocalized understanding questions were answered to the patient's satisfaction.  Her family is very supportive. Essential hypertension: Blood pressure stable and diet was emphasized.  Daughter has brought several blood pressure readings which is stable and fine. Mixed dyslipidemia: Lipids reviewed from [facility] sheet and we will recheck them again.  They are fine. Diabetes mellitus: Diet emphasized.  This is followed by primary care. Patient will be seen in follow-up appointment in 6 months or earlier if the patient has any concerns    Medication Adjustments/Labs and Tests Ordered: Current medicines are reviewed at length with the patient today.  Concerns regarding medicines are outlined above.  No orders of the defined types were placed in this encounter.  No orders of the defined types were placed in this encounter.    No chief complaint on file.    History of Present Illness:    Elaine Mathews is a 88 y.o. female.  Patient has past medical history of coronary artery disease, essential hypertension, dyslipidemia and diabetes mellitus.  She denies any problems at this time and takes care of  activities of daily living.  No chest pain orthopnea or PND.  At the time of my evaluation, the patient is alert awake oriented and in no distress.  She ambulates age appropriately.  Past Medical History:  Diagnosis Date   A-fib (HCC)    Abrasion, knee, left, initial encounter 10/28/2019   AF (paroxysmal atrial fibrillation) (HCC) 10/28/2019   Allergic rhinosinusitis    Arthritis    Asthma    Ataxia    With generalized weakness in legs   CAD (coronary artery disease)    CAD in native artery 10/28/2019   Cataract, bilateral    CHF (congestive heart failure) (HCC)    Chronic diastolic CHF (congestive heart failure) (HCC) 10/28/2019   Comprehensive diabetic foot examination, type 2 DM, encounter for (HCC) 02/15/2020   Controlled type 2 diabetes with neuropathy (HCC) 01/06/2017   Coronary artery disease    Dementia without behavioral disturbance (HCC) 10/28/2019   Diabetes mellitus due to underlying condition with unspecified complications (HCC) 11/17/2019   Diabetes mellitus without complication (HCC)    Diarrhea 10/28/2019   DM2 (diabetes mellitus, type 2) (HCC) 10/28/2019   Dysrhythmia    Elevated LFTs 07/12/2018   Elevated liver enzymes    Essential hypertension 11/13/2017   Fatty liver    Foot lesion    History of stroke 10/28/2019   Hyperlipidemia    Hypertension    Hypothyroidism    Leg pain, bilateral    MI (myocardial infarction) (HCC)    Mild asthma    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   Nail dystrophy 03/08/2019   Neuromuscular  disorder (HCC)    NSTEMI (non-ST elevated myocardial infarction) (HCC) 07/12/2018   Onychomycosis due to dermatophyte 02/15/2020   Paroxysmal atrial fibrillation (HCC) 11/13/2017   Peripheral vascular disease (HCC)    Pre-ulcerative calluses 01/06/2017   Prolonged QT interval 10/28/2019   PVD (peripheral vascular disease) (HCC)    Seizure (HCC)    Seizure disorder (HCC) 10/28/2019   Sinus pause 07/13/2018   Spleen hematoma without rupture of  capsule, without open wound into cavity 07/15/2018   Stroke (HCC)    Right parietal   Stroke (HCC)    Substernal chest pain 01/18/2020   Syncope and collapse 10/28/2019   Syncope, vasovagal 07/15/2018   Thyroid disease    Type 2 diabetes mellitus with hyperlipidemia (HCC) 10/28/2019   UTI (urinary tract infection) 01/21/2020   Vitamin D deficiency     Past Surgical History:  Procedure Laterality Date   APPENDECTOMY     CATARACT EXTRACTION Bilateral    CHOLECYSTECTOMY     CORONARY ANGIOPLASTY     CORONARY STENT INTERVENTION N/A 07/13/2018   Procedure: CORONARY STENT INTERVENTION;  Surgeon: Yvonne Kendall, MD;  Location: [facility];  Service: Cardiovascular;  Laterality: N/A;   INTRAVASCULAR PRESSURE WIRE/FFR STUDY N/A 07/13/2018   Procedure: INTRAVASCULAR PRESSURE WIRE/FFR STUDY;  Surgeon: Yvonne Kendall, MD;  Location: [facility];  Service: Cardiovascular;  Laterality: N/A;   LEFT HEART CATH AND CORONARY ANGIOGRAPHY N/A 07/13/2018   Procedure: LEFT HEART CATH AND CORONARY ANGIOGRAPHY;  Surgeon: Yvonne Kendall, MD;  Location: [facility];  Service: Cardiovascular;  Laterality: N/A;   TONSILLECTOMY      Current Medications: Current Meds  Medication Sig   atorvastatin (LIPITOR) 40 MG tablet Take 1 tablet (40 mg total) by mouth daily at 6 PM.   carvedilol (COREG) 3.125 MG tablet Take 1 tablet (3.125 mg total) by mouth 2 (two) times daily with a meal.   clopidogrel (PLAVIX) 75 MG tablet TAKE 1 TABLET BY MOUTH DAILY WITH BREAKFAST   ezetimibe (ZETIA) 10 MG tablet Take 10 mg by mouth every evening.    famotidine (PEPCID) 40 MG tablet Take 40 mg by mouth daily.   furosemide (LASIX) 40 MG tablet Take 40 mg by mouth 2 (two) times daily.   levETIRAcetam (KEPPRA) 250 MG tablet Take 250 mg by mouth every morning. And takes 500 mg every evening   levothyroxine (SYNTHROID) 88 MCG tablet Take 88 mcg by mouth daily.   lisinopril (ZESTRIL) 40 MG tablet Take 40 mg by mouth daily.    losartan (COZAAR) 50 MG tablet Take 50 mg by mouth daily.   memantine (NAMENDA) 10 MG tablet Take 10 mg by mouth 2 (two) times daily.   metFORMIN (GLUCOPHAGE) 500 MG tablet Take 250 mg by mouth 2 (two) times daily.    Misc Natural Products (GLUCOSAMINE CHONDROITIN TRIPLE) TABS Take 2 tablets by mouth every other day.    montelukast (SINGULAIR) 10 MG tablet Take 10 mg by mouth every evening.    nitroGLYCERIN (NITROSTAT) 0.4 MG SL tablet Place 1 tablet (0.4 mg total) under the tongue every 5 (five) minutes as needed for chest pain.   polyvinyl alcohol (LIQUIFILM TEARS) 1.4 % ophthalmic solution Place 1 drop into both eyes as needed for dry eyes.   potassium chloride (MICRO-K) 10 MEQ CR capsule TAKE 1 CAPSULE BY MOUTH DAILY   Prenatal Vit-Fe Fumarate-FA (PRENATAL VITAMIN PO) Take 1 tablet by mouth daily.   Probiotic Product [facility]) CAPS Take 1 capsule by mouth  daily.   rivaroxaban (XARELTO) 20 MG TABS tablet Take 1 tablet (20 mg total) by mouth daily with supper.   sertraline (ZOLOFT) 50 MG tablet Take 50 mg by mouth daily.   sodium chloride 1 g tablet Take 1 g by mouth daily.   Vitamin D, Ergocalciferol, (DRISDOL) 1.25 MG (50000 UNIT) CAPS capsule Take 50,000 Units by mouth every Wednesday.     Allergies:   Codeine and Penicillins   Social History   Socioeconomic History   Marital status: Divorced    Spouse name: Not on file   Number of children: Not on file   Years of education: Not on file   Highest education level: Not on file  Occupational History   Not on file  Tobacco Use   Smoking status: Never   Smokeless tobacco: Never  Vaping Use   Vaping Use: Never used  Substance and Sexual Activity   Alcohol use: Not Currently   Drug use: No   Sexual activity: Not Currently    Partners: Male  Other Topics Concern   Not on file  Social History Narrative   ** Merged History Encounter **       Lives with  Highest level of edu-  Right handed    Social Determinants of  Health   Financial Resource Strain: Not on file  Food Insecurity: Not on file  Transportation Needs: Not on file  Physical Activity: Not on file  Stress: Not on file  Social Connections: Not on file     Family History: The patient's family history includes CAD in her father and another family member; COPD in her son; Diabetes in her mother; Heart attack in her mother and son; Hypercholesterolemia in her mother; Hypertension in her father and mother; Stroke in her father and mother.  ROS:   Please see the history of present illness.    All other systems reviewed and are negative.  EKGs/Labs/Other Studies Reviewed:    The following studies were reviewed today: EKG reveals sinus rhythm with nonspecific ST-T changes   Recent Labs: 11/17/2020: ALT 22; BUN 12; Creatinine, Ser 0.53; Hemoglobin 11.1; Platelets 175; Potassium 3.8; Sodium 131; TSH 17.700  Recent Lipid Panel    Component Value Date/Time   CHOL 136 11/17/2020 1309   TRIG 170 (H) 11/17/2020 1309   HDL 58 11/17/2020 1309   CHOLHDL 2.3 11/17/2020 1309   CHOLHDL 3.7 07/13/2018 0234   VLDL 23 07/13/2018 0234   LDLCALC 50 11/17/2020 1309    Physical Exam:    VS:  BP 132/70   Pulse 72   Ht 5' 4.6" (1.641 m)   Wt 118 lb 12.8 oz (53.9 kg)   SpO2 94%   BMI 20.01 kg/m     Wt Readings from Last 3 Encounters:  09/27/21 118 lb 12.8 oz (53.9 kg)  11/17/20 125 lb 9.6 oz (57 kg)  05/17/20 116 lb 3.2 oz (52.7 kg)     GEN: Patient is in no acute distress HEENT: Normal NECK: No JVD; No carotid bruits LYMPHATICS: No lymphadenopathy CARDIAC: Hear sounds regular, 2/6 systolic murmur at the apex. RESPIRATORY:  Clear to auscultation without rales, wheezing or rhonchi  ABDOMEN: Soft, non-tender, non-distended MUSCULOSKELETAL:  No edema; No deformity  SKIN: Warm and dry NEUROLOGIC:  Alert and oriented x 3 PSYCHIATRIC:  Normal affect   Signed, Garwin Brothers, MD  09/27/2021 3:00 PM    [facility]

## 2021-09-27 NOTE — Patient Instructions (Signed)
Medication Instructions:  °Your physician recommends that you continue on your current medications as directed. Please refer to the Current Medication list given to you today. ° °*If you need a refill on your cardiac medications before your next appointment, please call your pharmacy* ° ° °Lab Work: °Your physician recommends that you have labs done in the office today. Your test included  basic metabolic panel, complete blood count, TSH, vitamin D, hgb A1C,liver function and lipids. ° °If you have labs (blood work) drawn today and your tests are completely normal, you will receive your results only by: °MyChart Message (if you have MyChart) OR °A paper copy in the mail °If you have any lab test that is abnormal or we need to change your treatment, we will call you to review the results. ° ° °Testing/Procedures: °None ordered ° ° °Follow-Up: °At [facility], you and your health needs are our priority.  As part of our continuing mission to provide you with exceptional heart care, we have created designated Provider Care Teams.  These Care Teams include your primary Cardiologist (physician) and Advanced Practice Providers (APPs -  Physician Assistants and Nurse Practitioners) who all work together to provide you with the care you need, when you need it. ° °We recommend signing up for the patient portal called "MyChart".  Sign up information is provided on this After Visit Summary.  MyChart is used to connect with patients for Virtual Visits (Telemedicine).  Patients are able to view lab/test results, encounter notes, upcoming appointments, etc.  Non-urgent messages can be sent to your provider as well.   °To learn more about what you can do with MyChart, go to [email or web address].   ° °Your next appointment:   °9 month(s) ° °The format for your next appointment:   °In Person ° °Provider:   °Rajan Revankar, MD ° ° °Other Instructions °NA   °

## 2021-09-28 LAB — HEPATIC FUNCTION PANEL
ALT: 26 IU/L (ref 0–32)
AST: 48 IU/L — ABNORMAL HIGH (ref 0–40)
Albumin: 4.7 g/dL (ref 3.7–4.7)
Alkaline Phosphatase: 75 IU/L (ref 44–121)
Bilirubin Total: 0.4 mg/dL (ref 0.0–1.2)
Bilirubin, Direct: 0.18 mg/dL (ref 0.00–0.40)
Total Protein: 7.1 g/dL (ref 6.0–8.5)

## 2021-09-28 LAB — LIPID PANEL
Chol/HDL Ratio: 2.4 ratio (ref 0.0–4.4)
Cholesterol, Total: 155 mg/dL (ref 100–199)
HDL: 64 mg/dL (ref >39)
LDL Chol Calc (NIH): 64 mg/dL (ref 0–99)
Triglycerides: 159 mg/dL — ABNORMAL HIGH (ref 0–149)
VLDL Cholesterol Cal: 27 mg/dL (ref 5–40)

## 2021-09-28 LAB — BASIC METABOLIC PANEL
BUN/Creatinine Ratio: 26 (ref 12–28)
BUN: 16 mg/dL (ref 8–27)
CO2: 24 mmol/L (ref 20–29)
Calcium: 9.2 mg/dL (ref 8.7–10.3)
Chloride: 93 mmol/L — ABNORMAL LOW (ref 96–106)
Creatinine, Ser: 0.61 mg/dL (ref 0.57–1.00)
Glucose: 115 mg/dL — ABNORMAL HIGH (ref 70–99)
Potassium: 4.6 mmol/L (ref 3.5–5.2)
Sodium: 134 mmol/L (ref 134–144)
eGFR: 87 mL/min/{1.73_m2} (ref >59)

## 2021-09-28 LAB — CBC
Hematocrit: 33.7 % — ABNORMAL LOW (ref 34.0–46.6)
Hemoglobin: 11.3 g/dL (ref 11.1–15.9)
MCH: 30.4 pg (ref 26.6–33.0)
MCHC: 33.5 g/dL (ref 31.5–35.7)
MCV: 91 fL (ref 79–97)
Platelets: 170 10*3/uL (ref 150–450)
RBC: 3.72 x10E6/uL — ABNORMAL LOW (ref 3.77–5.28)
RDW: 14.5 % (ref 11.7–15.4)
WBC: 5.7 10*3/uL (ref 3.4–10.8)

## 2021-09-28 LAB — HEMOGLOBIN A1C
Est. average glucose Bld gHb Est-mCnc: 137 mg/dL
Hgb A1c MFr Bld: 6.4 % — ABNORMAL HIGH (ref 4.8–5.6)

## 2021-09-28 LAB — VITAMIN D 25 HYDROXY (VIT D DEFICIENCY, FRACTURES): Vit D, 25-Hydroxy: 58.6 ng/mL (ref 30.0–100.0)

## 2021-09-28 LAB — TSH: TSH: 2.02 u[IU]/mL (ref 0.450–4.500)

## 2021-10-17 ENCOUNTER — Other Ambulatory Visit: Payer: Self-pay | Admitting: Cardiology

## 2021-12-10 DIAGNOSIS — E782 Mixed hyperlipidemia: Secondary | ICD-10-CM | POA: Diagnosis not present

## 2021-12-10 DIAGNOSIS — I10 Essential (primary) hypertension: Secondary | ICD-10-CM | POA: Diagnosis not present

## 2021-12-10 DIAGNOSIS — R600 Localized edema: Secondary | ICD-10-CM | POA: Diagnosis not present

## 2021-12-10 DIAGNOSIS — E1140 Type 2 diabetes mellitus with diabetic neuropathy, unspecified: Secondary | ICD-10-CM | POA: Diagnosis not present

## 2021-12-10 DIAGNOSIS — L853 Xerosis cutis: Secondary | ICD-10-CM | POA: Diagnosis not present

## 2021-12-10 DIAGNOSIS — E039 Hypothyroidism, unspecified: Secondary | ICD-10-CM | POA: Diagnosis not present

## 2021-12-10 DIAGNOSIS — Z681 Body mass index (BMI) 19 or less, adult: Secondary | ICD-10-CM | POA: Diagnosis not present

## 2021-12-19 ENCOUNTER — Other Ambulatory Visit: Payer: Self-pay | Admitting: Cardiology

## 2022-04-26 ENCOUNTER — Ambulatory Visit: Payer: Medicare Other | Admitting: Podiatry

## 2022-05-01 ENCOUNTER — Ambulatory Visit (INDEPENDENT_AMBULATORY_CARE_PROVIDER_SITE_OTHER): Payer: Medicare Other | Admitting: Podiatry

## 2022-05-01 ENCOUNTER — Ambulatory Visit: Payer: Medicare Other | Admitting: Podiatry

## 2022-05-01 DIAGNOSIS — Z91199 Patient's noncompliance with other medical treatment and regimen due to unspecified reason: Secondary | ICD-10-CM

## 2022-05-01 NOTE — Progress Notes (Signed)
No show

## 2022-05-10 DIAGNOSIS — M6281 Muscle weakness (generalized): Secondary | ICD-10-CM

## 2022-06-12 DIAGNOSIS — E1140 Type 2 diabetes mellitus with diabetic neuropathy, unspecified: Secondary | ICD-10-CM | POA: Diagnosis not present

## 2022-06-12 DIAGNOSIS — Z79899 Other long term (current) drug therapy: Secondary | ICD-10-CM | POA: Diagnosis not present

## 2022-06-17 ENCOUNTER — Other Ambulatory Visit: Payer: Self-pay

## 2022-06-18 DIAGNOSIS — Z7902 Long term (current) use of antithrombotics/antiplatelets: Secondary | ICD-10-CM | POA: Diagnosis not present

## 2022-06-18 DIAGNOSIS — M6281 Muscle weakness (generalized): Secondary | ICD-10-CM | POA: Diagnosis not present

## 2022-06-18 DIAGNOSIS — I480 Paroxysmal atrial fibrillation: Secondary | ICD-10-CM | POA: Diagnosis not present

## 2022-06-18 DIAGNOSIS — I2510 Atherosclerotic heart disease of native coronary artery without angina pectoris: Secondary | ICD-10-CM | POA: Diagnosis not present

## 2022-06-18 DIAGNOSIS — J45909 Unspecified asthma, uncomplicated: Secondary | ICD-10-CM | POA: Diagnosis not present

## 2022-06-18 DIAGNOSIS — K5730 Diverticulosis of large intestine without perforation or abscess without bleeding: Secondary | ICD-10-CM | POA: Diagnosis not present

## 2022-06-18 DIAGNOSIS — Z7901 Long term (current) use of anticoagulants: Secondary | ICD-10-CM | POA: Diagnosis not present

## 2022-06-18 DIAGNOSIS — I5030 Unspecified diastolic (congestive) heart failure: Secondary | ICD-10-CM | POA: Diagnosis not present

## 2022-06-18 DIAGNOSIS — E039 Hypothyroidism, unspecified: Secondary | ICD-10-CM | POA: Diagnosis not present

## 2022-06-18 DIAGNOSIS — E876 Hypokalemia: Secondary | ICD-10-CM | POA: Diagnosis not present

## 2022-06-18 DIAGNOSIS — K760 Fatty (change of) liver, not elsewhere classified: Secondary | ICD-10-CM | POA: Diagnosis not present

## 2022-06-18 DIAGNOSIS — L89892 Pressure ulcer of other site, stage 2: Secondary | ICD-10-CM | POA: Diagnosis not present

## 2022-06-18 DIAGNOSIS — Z7984 Long term (current) use of oral hypoglycemic drugs: Secondary | ICD-10-CM | POA: Diagnosis not present

## 2022-06-18 DIAGNOSIS — L89322 Pressure ulcer of left buttock, stage 2: Secondary | ICD-10-CM | POA: Diagnosis not present

## 2022-06-18 DIAGNOSIS — Z8673 Personal history of transient ischemic attack (TIA), and cerebral infarction without residual deficits: Secondary | ICD-10-CM | POA: Diagnosis not present

## 2022-06-18 DIAGNOSIS — E785 Hyperlipidemia, unspecified: Secondary | ICD-10-CM | POA: Diagnosis not present

## 2022-06-18 DIAGNOSIS — E1169 Type 2 diabetes mellitus with other specified complication: Secondary | ICD-10-CM | POA: Diagnosis not present

## 2022-06-18 DIAGNOSIS — M1990 Unspecified osteoarthritis, unspecified site: Secondary | ICD-10-CM | POA: Diagnosis not present

## 2022-06-18 DIAGNOSIS — I110 Hypertensive heart disease with heart failure: Secondary | ICD-10-CM | POA: Diagnosis not present

## 2022-06-18 DIAGNOSIS — Z9181 History of falling: Secondary | ICD-10-CM | POA: Diagnosis not present

## 2022-06-18 DIAGNOSIS — E1151 Type 2 diabetes mellitus with diabetic peripheral angiopathy without gangrene: Secondary | ICD-10-CM | POA: Diagnosis not present

## 2022-06-18 DIAGNOSIS — E1142 Type 2 diabetes mellitus with diabetic polyneuropathy: Secondary | ICD-10-CM | POA: Diagnosis not present

## 2022-06-18 DIAGNOSIS — L89620 Pressure ulcer of left heel, unstageable: Secondary | ICD-10-CM | POA: Diagnosis not present

## 2022-06-18 DIAGNOSIS — E559 Vitamin D deficiency, unspecified: Secondary | ICD-10-CM | POA: Diagnosis not present

## 2022-06-22 DIAGNOSIS — J45909 Unspecified asthma, uncomplicated: Secondary | ICD-10-CM | POA: Diagnosis not present

## 2022-06-22 DIAGNOSIS — Z7901 Long term (current) use of anticoagulants: Secondary | ICD-10-CM | POA: Diagnosis not present

## 2022-06-22 DIAGNOSIS — M1990 Unspecified osteoarthritis, unspecified site: Secondary | ICD-10-CM | POA: Diagnosis not present

## 2022-06-22 DIAGNOSIS — I480 Paroxysmal atrial fibrillation: Secondary | ICD-10-CM | POA: Diagnosis not present

## 2022-06-22 DIAGNOSIS — I2510 Atherosclerotic heart disease of native coronary artery without angina pectoris: Secondary | ICD-10-CM | POA: Diagnosis not present

## 2022-06-22 DIAGNOSIS — E1169 Type 2 diabetes mellitus with other specified complication: Secondary | ICD-10-CM | POA: Diagnosis not present

## 2022-06-22 DIAGNOSIS — K760 Fatty (change of) liver, not elsewhere classified: Secondary | ICD-10-CM | POA: Diagnosis not present

## 2022-06-22 DIAGNOSIS — E876 Hypokalemia: Secondary | ICD-10-CM | POA: Diagnosis not present

## 2022-06-22 DIAGNOSIS — K5730 Diverticulosis of large intestine without perforation or abscess without bleeding: Secondary | ICD-10-CM | POA: Diagnosis not present

## 2022-06-22 DIAGNOSIS — Z7902 Long term (current) use of antithrombotics/antiplatelets: Secondary | ICD-10-CM | POA: Diagnosis not present

## 2022-06-22 DIAGNOSIS — L89322 Pressure ulcer of left buttock, stage 2: Secondary | ICD-10-CM | POA: Diagnosis not present

## 2022-06-22 DIAGNOSIS — L89620 Pressure ulcer of left heel, unstageable: Secondary | ICD-10-CM | POA: Diagnosis not present

## 2022-06-22 DIAGNOSIS — E559 Vitamin D deficiency, unspecified: Secondary | ICD-10-CM | POA: Diagnosis not present

## 2022-06-22 DIAGNOSIS — L89892 Pressure ulcer of other site, stage 2: Secondary | ICD-10-CM | POA: Diagnosis not present

## 2022-06-22 DIAGNOSIS — I5030 Unspecified diastolic (congestive) heart failure: Secondary | ICD-10-CM | POA: Diagnosis not present

## 2022-06-22 DIAGNOSIS — I110 Hypertensive heart disease with heart failure: Secondary | ICD-10-CM | POA: Diagnosis not present

## 2022-06-22 DIAGNOSIS — E1151 Type 2 diabetes mellitus with diabetic peripheral angiopathy without gangrene: Secondary | ICD-10-CM | POA: Diagnosis not present

## 2022-06-22 DIAGNOSIS — E1142 Type 2 diabetes mellitus with diabetic polyneuropathy: Secondary | ICD-10-CM | POA: Diagnosis not present

## 2022-06-22 DIAGNOSIS — Z8673 Personal history of transient ischemic attack (TIA), and cerebral infarction without residual deficits: Secondary | ICD-10-CM | POA: Diagnosis not present

## 2022-06-22 DIAGNOSIS — Z7984 Long term (current) use of oral hypoglycemic drugs: Secondary | ICD-10-CM | POA: Diagnosis not present

## 2022-06-22 DIAGNOSIS — Z9181 History of falling: Secondary | ICD-10-CM | POA: Diagnosis not present

## 2022-06-22 DIAGNOSIS — E785 Hyperlipidemia, unspecified: Secondary | ICD-10-CM | POA: Diagnosis not present

## 2022-06-22 DIAGNOSIS — E039 Hypothyroidism, unspecified: Secondary | ICD-10-CM | POA: Diagnosis not present

## 2022-06-24 ENCOUNTER — Ambulatory Visit: Payer: Medicare Other | Attending: Cardiology | Admitting: Cardiology

## 2022-06-24 ENCOUNTER — Encounter: Payer: Self-pay | Admitting: Cardiology

## 2022-06-24 VITALS — BP 120/60 | HR 70 | Resp 18 | Ht 64.5 in | Wt 116.0 lb

## 2022-06-24 DIAGNOSIS — E782 Mixed hyperlipidemia: Secondary | ICD-10-CM

## 2022-06-24 DIAGNOSIS — I2510 Atherosclerotic heart disease of native coronary artery without angina pectoris: Secondary | ICD-10-CM

## 2022-06-24 DIAGNOSIS — G459 Transient cerebral ischemic attack, unspecified: Secondary | ICD-10-CM | POA: Diagnosis not present

## 2022-06-24 DIAGNOSIS — I10 Essential (primary) hypertension: Secondary | ICD-10-CM

## 2022-06-24 DIAGNOSIS — R531 Weakness: Secondary | ICD-10-CM | POA: Diagnosis not present

## 2022-06-24 DIAGNOSIS — Z7401 Bed confinement status: Secondary | ICD-10-CM | POA: Diagnosis not present

## 2022-06-24 NOTE — Patient Instructions (Signed)
Medication Instructions:  Your physician recommends that you continue on your current medications as directed. Please refer to the Current Medication list given to you today.  *If you need a refill on your cardiac medications before your next appointment, please call your pharmacy*   Lab Work: None ordered If you have labs (blood work) drawn today and your tests are completely normal, you will receive your results only by: MyChart Message (if you have MyChart) OR A paper copy in the mail If you have any lab test that is abnormal or we need to change your treatment, we will call you to review the results.   Testing/Procedures: None ordered   Follow-Up: At [facility], you and your health needs are our priority.  As part of our continuing mission to provide you with exceptional heart care, we have created designated Provider Care Teams.  These Care Teams include your primary Cardiologist (physician) and Advanced Practice Providers (APPs -  Physician Assistants and Nurse Practitioners) who all work together to provide you with the care you need, when you need it.  We recommend signing up for the patient portal called "MyChart".  Sign up information is provided on this After Visit Summary.  MyChart is used to connect with patients for Virtual Visits (Telemedicine).  Patients are able to view lab/test results, encounter notes, upcoming appointments, etc.  Non-urgent messages can be sent to your provider as well.   To learn more about what you can do with MyChart, go to [email or web address].    Your next appointment:   12 month(s)  The format for your next appointment:   In Person  Provider:   Rajan Revankar, MD    Other Instructions none  Important Information About Sugar      

## 2022-06-24 NOTE — Progress Notes (Signed)
Cardiology Office Note:    Date:  06/24/2022   ID:  Elaine Mathews, DOB 1933-11-15, MRN [id number]  PCP:  Lucianne Lei, MD  Cardiologist:  Garwin Brothers, MD   Referring MD: Lucianne Lei, MD    ASSESSMENT:    1. Essential hypertension   2. Coronary artery disease involving native coronary artery of native heart without angina pectoris   3. Mixed hyperlipidemia    PLAN:    In order of problems listed above:  Coronary artery disease: Postcoronary stenting in the remote past.  Asymptomatic at this time.  Plan is to continue medical management. Essential hypertension: Blood pressure stable and diet was emphasized. Mixed dyslipidemia: Lipids were reviewed from [facility] sheet and found to be fine. Patient will be seen in follow-up appointment in 6 months or earlier if the patient has any concerns. History of PE: On anticoagulation followed by primary care.   Medication Adjustments/Labs and Tests Ordered: Current medicines are reviewed at length with the patient today.  Concerns regarding medicines are outlined above.  No orders of the defined types were placed in this encounter.  No orders of the defined types were placed in this encounter.    No chief complaint on file.    History of Present Illness:    Elaine Mathews is a 89 y.o. female.  Patient has past medical history of coronary artery disease, essential hypertension and mixed dyslipidemia.  She is brought in in an ambulance on a stretcher because of issues with weakness in the legs.  This has been evaluated in the hospital according to the family member.  She is cheerful.  She denies any chest pain orthopnea or PND.  She has undergone coronary intervention in the past.  At the time of my evaluation, the patient is alert awake oriented and in no distress.  Past Medical History:  Diagnosis Date   A-fib (HCC)    Abrasion, knee, left, initial encounter 10/28/2019   AF (paroxysmal atrial fibrillation) (HCC) 10/28/2019   Allergic  rhinosinusitis    Arthritis    Asthma    Ataxia    With generalized weakness in legs   CAD (coronary artery disease)    CAD in native artery 10/28/2019   Cataract, bilateral    CHF (congestive heart failure) (HCC)    Chronic diastolic CHF (congestive heart failure) (HCC) 10/28/2019   Comprehensive diabetic foot examination, type 2 DM, encounter for (HCC) 02/15/2020   Controlled type 2 diabetes with neuropathy (HCC) 01/06/2017   Coronary artery disease    Dementia without behavioral disturbance (HCC) 10/28/2019   Diabetes mellitus due to underlying condition with unspecified complications (HCC) 11/17/2019   Diabetes mellitus without complication (HCC)    Diarrhea 10/28/2019   DM2 (diabetes mellitus, type 2) (HCC) 10/28/2019   Dysrhythmia    Elevated LFTs 07/12/2018   Elevated liver enzymes    Essential hypertension 11/13/2017   Fatty liver    Foot lesion    Generalized muscle weakness 05/10/2022   Hyperlipidemia    Hypertension    Hypothyroidism    Leg pain, bilateral    MI (myocardial infarction) (HCC)    Mild asthma    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   Nail dystrophy 03/08/2019   Neuromuscular disorder (HCC)    NSTEMI (non-ST elevated myocardial infarction) (HCC) 07/12/2018   Onychomycosis due to dermatophyte 02/15/2020   Paroxysmal atrial fibrillation (HCC) 11/13/2017   Peripheral vascular disease (HCC)    Pre-ulcerative calluses 01/06/2017   Prolonged QT interval  10/28/2019   PVD (peripheral vascular disease) (HCC)    Seizure (HCC)    Seizure disorder (HCC) 10/28/2019   Sinus pause 07/13/2018   Spleen hematoma without rupture of capsule, without open wound into cavity 07/15/2018   Stroke (HCC)    Right parietal   Stroke (HCC)    Substernal chest pain 01/18/2020   Syncope and collapse 10/28/2019   Syncope, vasovagal 07/15/2018   Thyroid disease    Type 2 diabetes mellitus with hyperlipidemia (HCC) 10/28/2019   UTI (urinary tract infection)  01/21/2020   Vitamin D deficiency     Past Surgical History:  Procedure Laterality Date   APPENDECTOMY     CATARACT EXTRACTION Bilateral    CHOLECYSTECTOMY     CORONARY ANGIOPLASTY     CORONARY PRESSURE/FFR STUDY N/A 07/13/2018   Procedure: INTRAVASCULAR PRESSURE WIRE/FFR STUDY;  Surgeon: Yvonne Kendall, MD;  Location: [facility];  Service: Cardiovascular;  Laterality: N/A;   CORONARY STENT INTERVENTION N/A 07/13/2018   Procedure: CORONARY STENT INTERVENTION;  Surgeon: Yvonne Kendall, MD;  Location: [facility];  Service: Cardiovascular;  Laterality: N/A;   LEFT HEART CATH AND CORONARY ANGIOGRAPHY N/A 07/13/2018   Procedure: LEFT HEART CATH AND CORONARY ANGIOGRAPHY;  Surgeon: Yvonne Kendall, MD;  Location: [facility];  Service: Cardiovascular;  Laterality: N/A;   TONSILLECTOMY      Current Medications: Current Meds  Medication Sig   atorvastatin (LIPITOR) 40 MG tablet Take 1 tablet (40 mg total) by mouth daily at 6 PM.   carvedilol (COREG) 3.125 MG tablet TAKE 1 TABLET BY MOUTH 2 TIMES DAILY WITH A MEAL.   clopidogrel (PLAVIX) 75 MG tablet Take 1 tablet (75 mg total) by mouth daily with breakfast.   ezetimibe (ZETIA) 10 MG tablet Take 10 mg by mouth every evening.    famotidine (PEPCID) 40 MG tablet Take 40 mg by mouth daily.   furosemide (LASIX) 40 MG tablet Take 40 mg by mouth 2 (two) times daily.   levETIRAcetam (KEPPRA) 250 MG tablet Take 250 mg by mouth every morning. And takes 500 mg every evening   levETIRAcetam (KEPPRA) 500 MG tablet Take 500 mg by mouth at bedtime.   levothyroxine (SYNTHROID) 88 MCG tablet Take 88 mcg by mouth daily.   lisinopril (ZESTRIL) 40 MG tablet Take 40 mg by mouth daily.   losartan (COZAAR) 100 MG tablet Take 100 mg by mouth daily.   memantine (NAMENDA) 10 MG tablet Take 10 mg by mouth 2 (two) times daily.   metFORMIN (GLUCOPHAGE) 500 MG tablet Take 250 mg by mouth 2 (two) times daily.    Misc Natural Products (GLUCOSAMINE  CHONDROITIN TRIPLE) TABS Take 2 tablets by mouth every other day.    montelukast (SINGULAIR) 10 MG tablet Take 10 mg by mouth every evening.    nitroGLYCERIN (NITROSTAT) 0.4 MG SL tablet Place 1 tablet (0.4 mg total) under the tongue every 5 (five) minutes as needed for chest pain.   polyvinyl alcohol (LIQUIFILM TEARS) 1.4 % ophthalmic solution Place 1 drop into both eyes as needed for dry eyes.   potassium chloride (MICRO-K) 10 MEQ CR capsule Take 1 capsule (10 mEq total) by mouth daily.   Prenatal Vit-Fe Fumarate-FA (PRENATAL VITAMIN PO) Take 1 tablet by mouth daily.   Probiotic Product [facility]) CAPS Take 1 capsule by mouth daily.   rivaroxaban (XARELTO) 20 MG TABS tablet Take 1 tablet (20 mg total) by mouth daily with supper.   sertraline (ZOLOFT) 50 MG tablet Take  50 mg by mouth daily.   sodium chloride 1 g tablet Take 1 g by mouth daily.   Vitamin D, Ergocalciferol, (DRISDOL) 1.25 MG (50000 UNIT) CAPS capsule Take 50,000 Units by mouth every Wednesday.     Allergies:   Codeine and Penicillins   Social History   Socioeconomic History   Marital status: Divorced    Spouse name: Not on file   Number of children: Not on file   Years of education: Not on file   Highest education level: Not on file  Occupational History   Not on file  Tobacco Use   Smoking status: Never   Smokeless tobacco: Never  Vaping Use   Vaping Use: Never used  Substance and Sexual Activity   Alcohol use: Not Currently   Drug use: No   Sexual activity: Not Currently    Partners: Male  Other Topics Concern   Not on file  Social History Narrative   ** Merged History Encounter **       Lives with  Highest level of edu-  Right handed    Social Determinants of Health   Financial Resource Strain: Not on file  Food Insecurity: Not on file  Transportation Needs: Not on file  Physical Activity: Not on file  Stress: Not on file  Social Connections: Not on file     Family History: The patient's  family history includes CAD in her father and another family member; COPD in her son; Diabetes in her mother; Heart attack in her mother and son; Hypercholesterolemia in her mother; Hypertension in her father and mother; Stroke in her father and mother.  ROS:   Please see the history of present illness.    All other systems reviewed and are negative.  EKGs/Labs/Other Studies Reviewed:    The following studies were reviewed today: I discussed my findings with the patient at length.   Recent Labs: 09/27/2021: ALT 26; BUN 16; Creatinine, Ser 0.61; Hemoglobin 11.3; Platelets 170; Potassium 4.6; Sodium 134; TSH 2.020  Recent Lipid Panel    Component Value Date/Time   CHOL 155 09/27/2021 1513   TRIG 159 (H) 09/27/2021 1513   HDL 64 09/27/2021 1513   CHOLHDL 2.4 09/27/2021 1513   CHOLHDL 3.7 07/13/2018 0234   VLDL 23 07/13/2018 0234   LDLCALC 64 09/27/2021 1513    Physical Exam:    VS:  BP 120/60 (BP Location: Left Arm, Patient Position: Bed low/side rails up, Cuff Size: Normal)   Pulse 70   Resp 18   Ht 5' 4.5" (1.638 m)   Wt 116 lb (52.6 kg)   SpO2 96%   BMI 19.60 kg/m     Wt Readings from Last 3 Encounters:  06/24/22 116 lb (52.6 kg)  09/27/21 118 lb 12.8 oz (53.9 kg)  11/17/20 125 lb 9.6 oz (57 kg)     GEN: Patient is in no acute distress HEENT: Normal NECK: No JVD; No carotid bruits LYMPHATICS: No lymphadenopathy CARDIAC: Hear sounds regular, 2/6 systolic murmur at the apex. RESPIRATORY:  Clear to auscultation without rales, wheezing or rhonchi  ABDOMEN: Soft, non-tender, non-distended MUSCULOSKELETAL:  No edema; No deformity  SKIN: Warm and dry NEUROLOGIC:  Alert and oriented x 3 PSYCHIATRIC:  Normal affect   Signed, Garwin Brothers, MD  06/24/2022 10:45 AM    [facility]

## 2022-06-26 DIAGNOSIS — I480 Paroxysmal atrial fibrillation: Secondary | ICD-10-CM | POA: Diagnosis not present

## 2022-06-26 DIAGNOSIS — E1142 Type 2 diabetes mellitus with diabetic polyneuropathy: Secondary | ICD-10-CM | POA: Diagnosis not present

## 2022-06-26 DIAGNOSIS — E039 Hypothyroidism, unspecified: Secondary | ICD-10-CM | POA: Diagnosis not present

## 2022-06-26 DIAGNOSIS — Z7902 Long term (current) use of antithrombotics/antiplatelets: Secondary | ICD-10-CM | POA: Diagnosis not present

## 2022-06-26 DIAGNOSIS — L89892 Pressure ulcer of other site, stage 2: Secondary | ICD-10-CM | POA: Diagnosis not present

## 2022-06-26 DIAGNOSIS — E559 Vitamin D deficiency, unspecified: Secondary | ICD-10-CM | POA: Diagnosis not present

## 2022-06-26 DIAGNOSIS — M1990 Unspecified osteoarthritis, unspecified site: Secondary | ICD-10-CM | POA: Diagnosis not present

## 2022-06-26 DIAGNOSIS — L89620 Pressure ulcer of left heel, unstageable: Secondary | ICD-10-CM | POA: Diagnosis not present

## 2022-06-26 DIAGNOSIS — K760 Fatty (change of) liver, not elsewhere classified: Secondary | ICD-10-CM | POA: Diagnosis not present

## 2022-06-26 DIAGNOSIS — Z9181 History of falling: Secondary | ICD-10-CM | POA: Diagnosis not present

## 2022-06-26 DIAGNOSIS — J45909 Unspecified asthma, uncomplicated: Secondary | ICD-10-CM | POA: Diagnosis not present

## 2022-06-26 DIAGNOSIS — K5730 Diverticulosis of large intestine without perforation or abscess without bleeding: Secondary | ICD-10-CM | POA: Diagnosis not present

## 2022-06-26 DIAGNOSIS — I110 Hypertensive heart disease with heart failure: Secondary | ICD-10-CM | POA: Diagnosis not present

## 2022-06-26 DIAGNOSIS — Z7984 Long term (current) use of oral hypoglycemic drugs: Secondary | ICD-10-CM | POA: Diagnosis not present

## 2022-06-26 DIAGNOSIS — E785 Hyperlipidemia, unspecified: Secondary | ICD-10-CM | POA: Diagnosis not present

## 2022-06-26 DIAGNOSIS — Z8673 Personal history of transient ischemic attack (TIA), and cerebral infarction without residual deficits: Secondary | ICD-10-CM | POA: Diagnosis not present

## 2022-06-26 DIAGNOSIS — E876 Hypokalemia: Secondary | ICD-10-CM | POA: Diagnosis not present

## 2022-06-26 DIAGNOSIS — E1151 Type 2 diabetes mellitus with diabetic peripheral angiopathy without gangrene: Secondary | ICD-10-CM | POA: Diagnosis not present

## 2022-06-26 DIAGNOSIS — I5030 Unspecified diastolic (congestive) heart failure: Secondary | ICD-10-CM | POA: Diagnosis not present

## 2022-06-26 DIAGNOSIS — I2510 Atherosclerotic heart disease of native coronary artery without angina pectoris: Secondary | ICD-10-CM | POA: Diagnosis not present

## 2022-06-26 DIAGNOSIS — E1169 Type 2 diabetes mellitus with other specified complication: Secondary | ICD-10-CM | POA: Diagnosis not present

## 2022-06-26 DIAGNOSIS — L89322 Pressure ulcer of left buttock, stage 2: Secondary | ICD-10-CM | POA: Diagnosis not present

## 2022-06-26 DIAGNOSIS — Z7901 Long term (current) use of anticoagulants: Secondary | ICD-10-CM | POA: Diagnosis not present

## 2022-07-02 DIAGNOSIS — K5730 Diverticulosis of large intestine without perforation or abscess without bleeding: Secondary | ICD-10-CM | POA: Diagnosis not present

## 2022-07-02 DIAGNOSIS — L89322 Pressure ulcer of left buttock, stage 2: Secondary | ICD-10-CM | POA: Diagnosis not present

## 2022-07-02 DIAGNOSIS — E876 Hypokalemia: Secondary | ICD-10-CM | POA: Diagnosis not present

## 2022-07-02 DIAGNOSIS — Z7902 Long term (current) use of antithrombotics/antiplatelets: Secondary | ICD-10-CM | POA: Diagnosis not present

## 2022-07-02 DIAGNOSIS — Z9181 History of falling: Secondary | ICD-10-CM | POA: Diagnosis not present

## 2022-07-02 DIAGNOSIS — L89892 Pressure ulcer of other site, stage 2: Secondary | ICD-10-CM | POA: Diagnosis not present

## 2022-07-02 DIAGNOSIS — M1990 Unspecified osteoarthritis, unspecified site: Secondary | ICD-10-CM | POA: Diagnosis not present

## 2022-07-02 DIAGNOSIS — Z7984 Long term (current) use of oral hypoglycemic drugs: Secondary | ICD-10-CM | POA: Diagnosis not present

## 2022-07-02 DIAGNOSIS — Z8673 Personal history of transient ischemic attack (TIA), and cerebral infarction without residual deficits: Secondary | ICD-10-CM | POA: Diagnosis not present

## 2022-07-02 DIAGNOSIS — L89620 Pressure ulcer of left heel, unstageable: Secondary | ICD-10-CM | POA: Diagnosis not present

## 2022-07-02 DIAGNOSIS — K760 Fatty (change of) liver, not elsewhere classified: Secondary | ICD-10-CM | POA: Diagnosis not present

## 2022-07-02 DIAGNOSIS — E1169 Type 2 diabetes mellitus with other specified complication: Secondary | ICD-10-CM | POA: Diagnosis not present

## 2022-07-02 DIAGNOSIS — J45909 Unspecified asthma, uncomplicated: Secondary | ICD-10-CM | POA: Diagnosis not present

## 2022-07-02 DIAGNOSIS — Z7901 Long term (current) use of anticoagulants: Secondary | ICD-10-CM | POA: Diagnosis not present

## 2022-07-02 DIAGNOSIS — E1142 Type 2 diabetes mellitus with diabetic polyneuropathy: Secondary | ICD-10-CM | POA: Diagnosis not present

## 2022-07-02 DIAGNOSIS — I480 Paroxysmal atrial fibrillation: Secondary | ICD-10-CM | POA: Diagnosis not present

## 2022-07-02 DIAGNOSIS — E1151 Type 2 diabetes mellitus with diabetic peripheral angiopathy without gangrene: Secondary | ICD-10-CM | POA: Diagnosis not present

## 2022-07-02 DIAGNOSIS — E785 Hyperlipidemia, unspecified: Secondary | ICD-10-CM | POA: Diagnosis not present

## 2022-07-02 DIAGNOSIS — I110 Hypertensive heart disease with heart failure: Secondary | ICD-10-CM | POA: Diagnosis not present

## 2022-07-02 DIAGNOSIS — E559 Vitamin D deficiency, unspecified: Secondary | ICD-10-CM | POA: Diagnosis not present

## 2022-07-02 DIAGNOSIS — E039 Hypothyroidism, unspecified: Secondary | ICD-10-CM | POA: Diagnosis not present

## 2022-07-02 DIAGNOSIS — I2510 Atherosclerotic heart disease of native coronary artery without angina pectoris: Secondary | ICD-10-CM | POA: Diagnosis not present

## 2022-07-02 DIAGNOSIS — I5030 Unspecified diastolic (congestive) heart failure: Secondary | ICD-10-CM | POA: Diagnosis not present

## 2022-07-03 DIAGNOSIS — L89620 Pressure ulcer of left heel, unstageable: Secondary | ICD-10-CM | POA: Diagnosis not present

## 2022-07-03 DIAGNOSIS — E119 Type 2 diabetes mellitus without complications: Secondary | ICD-10-CM | POA: Diagnosis not present

## 2022-07-04 DIAGNOSIS — E1151 Type 2 diabetes mellitus with diabetic peripheral angiopathy without gangrene: Secondary | ICD-10-CM | POA: Diagnosis not present

## 2022-07-04 DIAGNOSIS — E559 Vitamin D deficiency, unspecified: Secondary | ICD-10-CM | POA: Diagnosis not present

## 2022-07-04 DIAGNOSIS — I110 Hypertensive heart disease with heart failure: Secondary | ICD-10-CM | POA: Diagnosis not present

## 2022-07-04 DIAGNOSIS — E1142 Type 2 diabetes mellitus with diabetic polyneuropathy: Secondary | ICD-10-CM | POA: Diagnosis not present

## 2022-07-04 DIAGNOSIS — Z8673 Personal history of transient ischemic attack (TIA), and cerebral infarction without residual deficits: Secondary | ICD-10-CM | POA: Diagnosis not present

## 2022-07-04 DIAGNOSIS — I5030 Unspecified diastolic (congestive) heart failure: Secondary | ICD-10-CM | POA: Diagnosis not present

## 2022-07-04 DIAGNOSIS — E039 Hypothyroidism, unspecified: Secondary | ICD-10-CM | POA: Diagnosis not present

## 2022-07-04 DIAGNOSIS — I480 Paroxysmal atrial fibrillation: Secondary | ICD-10-CM | POA: Diagnosis not present

## 2022-07-04 DIAGNOSIS — J45909 Unspecified asthma, uncomplicated: Secondary | ICD-10-CM | POA: Diagnosis not present

## 2022-07-04 DIAGNOSIS — L89620 Pressure ulcer of left heel, unstageable: Secondary | ICD-10-CM | POA: Diagnosis not present

## 2022-07-04 DIAGNOSIS — Z9181 History of falling: Secondary | ICD-10-CM | POA: Diagnosis not present

## 2022-07-04 DIAGNOSIS — K5730 Diverticulosis of large intestine without perforation or abscess without bleeding: Secondary | ICD-10-CM | POA: Diagnosis not present

## 2022-07-04 DIAGNOSIS — Z7984 Long term (current) use of oral hypoglycemic drugs: Secondary | ICD-10-CM | POA: Diagnosis not present

## 2022-07-04 DIAGNOSIS — I2510 Atherosclerotic heart disease of native coronary artery without angina pectoris: Secondary | ICD-10-CM | POA: Diagnosis not present

## 2022-07-04 DIAGNOSIS — M1990 Unspecified osteoarthritis, unspecified site: Secondary | ICD-10-CM | POA: Diagnosis not present

## 2022-07-04 DIAGNOSIS — Z7901 Long term (current) use of anticoagulants: Secondary | ICD-10-CM | POA: Diagnosis not present

## 2022-07-04 DIAGNOSIS — E1169 Type 2 diabetes mellitus with other specified complication: Secondary | ICD-10-CM | POA: Diagnosis not present

## 2022-07-04 DIAGNOSIS — L89322 Pressure ulcer of left buttock, stage 2: Secondary | ICD-10-CM | POA: Diagnosis not present

## 2022-07-04 DIAGNOSIS — E785 Hyperlipidemia, unspecified: Secondary | ICD-10-CM | POA: Diagnosis not present

## 2022-07-04 DIAGNOSIS — K760 Fatty (change of) liver, not elsewhere classified: Secondary | ICD-10-CM | POA: Diagnosis not present

## 2022-07-04 DIAGNOSIS — E876 Hypokalemia: Secondary | ICD-10-CM | POA: Diagnosis not present

## 2022-07-04 DIAGNOSIS — L89892 Pressure ulcer of other site, stage 2: Secondary | ICD-10-CM | POA: Diagnosis not present

## 2022-07-04 DIAGNOSIS — Z7902 Long term (current) use of antithrombotics/antiplatelets: Secondary | ICD-10-CM | POA: Diagnosis not present

## 2022-07-09 DIAGNOSIS — E039 Hypothyroidism, unspecified: Secondary | ICD-10-CM | POA: Diagnosis not present

## 2022-07-09 DIAGNOSIS — E785 Hyperlipidemia, unspecified: Secondary | ICD-10-CM | POA: Diagnosis not present

## 2022-07-09 DIAGNOSIS — K760 Fatty (change of) liver, not elsewhere classified: Secondary | ICD-10-CM | POA: Diagnosis not present

## 2022-07-09 DIAGNOSIS — E559 Vitamin D deficiency, unspecified: Secondary | ICD-10-CM | POA: Diagnosis not present

## 2022-07-09 DIAGNOSIS — I5030 Unspecified diastolic (congestive) heart failure: Secondary | ICD-10-CM | POA: Diagnosis not present

## 2022-07-09 DIAGNOSIS — K5730 Diverticulosis of large intestine without perforation or abscess without bleeding: Secondary | ICD-10-CM | POA: Diagnosis not present

## 2022-07-09 DIAGNOSIS — Z8673 Personal history of transient ischemic attack (TIA), and cerebral infarction without residual deficits: Secondary | ICD-10-CM | POA: Diagnosis not present

## 2022-07-09 DIAGNOSIS — Z7902 Long term (current) use of antithrombotics/antiplatelets: Secondary | ICD-10-CM | POA: Diagnosis not present

## 2022-07-09 DIAGNOSIS — J45909 Unspecified asthma, uncomplicated: Secondary | ICD-10-CM | POA: Diagnosis not present

## 2022-07-09 DIAGNOSIS — Z7984 Long term (current) use of oral hypoglycemic drugs: Secondary | ICD-10-CM | POA: Diagnosis not present

## 2022-07-09 DIAGNOSIS — E1142 Type 2 diabetes mellitus with diabetic polyneuropathy: Secondary | ICD-10-CM | POA: Diagnosis not present

## 2022-07-09 DIAGNOSIS — I2510 Atherosclerotic heart disease of native coronary artery without angina pectoris: Secondary | ICD-10-CM | POA: Diagnosis not present

## 2022-07-09 DIAGNOSIS — E1151 Type 2 diabetes mellitus with diabetic peripheral angiopathy without gangrene: Secondary | ICD-10-CM | POA: Diagnosis not present

## 2022-07-09 DIAGNOSIS — L89322 Pressure ulcer of left buttock, stage 2: Secondary | ICD-10-CM | POA: Diagnosis not present

## 2022-07-09 DIAGNOSIS — M1990 Unspecified osteoarthritis, unspecified site: Secondary | ICD-10-CM | POA: Diagnosis not present

## 2022-07-09 DIAGNOSIS — L89620 Pressure ulcer of left heel, unstageable: Secondary | ICD-10-CM | POA: Diagnosis not present

## 2022-07-09 DIAGNOSIS — Z7901 Long term (current) use of anticoagulants: Secondary | ICD-10-CM | POA: Diagnosis not present

## 2022-07-09 DIAGNOSIS — L89892 Pressure ulcer of other site, stage 2: Secondary | ICD-10-CM | POA: Diagnosis not present

## 2022-07-09 DIAGNOSIS — E876 Hypokalemia: Secondary | ICD-10-CM | POA: Diagnosis not present

## 2022-07-09 DIAGNOSIS — I480 Paroxysmal atrial fibrillation: Secondary | ICD-10-CM | POA: Diagnosis not present

## 2022-07-09 DIAGNOSIS — Z9181 History of falling: Secondary | ICD-10-CM | POA: Diagnosis not present

## 2022-07-09 DIAGNOSIS — I110 Hypertensive heart disease with heart failure: Secondary | ICD-10-CM | POA: Diagnosis not present

## 2022-07-09 DIAGNOSIS — E1169 Type 2 diabetes mellitus with other specified complication: Secondary | ICD-10-CM | POA: Diagnosis not present

## 2022-07-11 DIAGNOSIS — L89620 Pressure ulcer of left heel, unstageable: Secondary | ICD-10-CM | POA: Diagnosis not present

## 2022-07-11 DIAGNOSIS — E119 Type 2 diabetes mellitus without complications: Secondary | ICD-10-CM | POA: Diagnosis not present

## 2022-07-16 DIAGNOSIS — R350 Frequency of micturition: Secondary | ICD-10-CM | POA: Diagnosis not present

## 2022-07-18 DIAGNOSIS — M1990 Unspecified osteoarthritis, unspecified site: Secondary | ICD-10-CM | POA: Diagnosis not present

## 2022-07-18 DIAGNOSIS — Z681 Body mass index (BMI) 19 or less, adult: Secondary | ICD-10-CM | POA: Diagnosis not present

## 2022-07-18 DIAGNOSIS — I2510 Atherosclerotic heart disease of native coronary artery without angina pectoris: Secondary | ICD-10-CM | POA: Diagnosis not present

## 2022-07-18 DIAGNOSIS — Z8673 Personal history of transient ischemic attack (TIA), and cerebral infarction without residual deficits: Secondary | ICD-10-CM | POA: Diagnosis not present

## 2022-07-18 DIAGNOSIS — E1142 Type 2 diabetes mellitus with diabetic polyneuropathy: Secondary | ICD-10-CM | POA: Diagnosis not present

## 2022-07-18 DIAGNOSIS — I110 Hypertensive heart disease with heart failure: Secondary | ICD-10-CM | POA: Diagnosis not present

## 2022-07-18 DIAGNOSIS — K5730 Diverticulosis of large intestine without perforation or abscess without bleeding: Secondary | ICD-10-CM | POA: Diagnosis not present

## 2022-07-18 DIAGNOSIS — Z7902 Long term (current) use of antithrombotics/antiplatelets: Secondary | ICD-10-CM | POA: Diagnosis not present

## 2022-07-18 DIAGNOSIS — I480 Paroxysmal atrial fibrillation: Secondary | ICD-10-CM | POA: Diagnosis not present

## 2022-07-18 DIAGNOSIS — Z7901 Long term (current) use of anticoagulants: Secondary | ICD-10-CM | POA: Diagnosis not present

## 2022-07-18 DIAGNOSIS — Z7984 Long term (current) use of oral hypoglycemic drugs: Secondary | ICD-10-CM | POA: Diagnosis not present

## 2022-07-18 DIAGNOSIS — I5030 Unspecified diastolic (congestive) heart failure: Secondary | ICD-10-CM | POA: Diagnosis not present

## 2022-07-18 DIAGNOSIS — R296 Repeated falls: Secondary | ICD-10-CM | POA: Diagnosis not present

## 2022-07-18 DIAGNOSIS — Z7409 Other reduced mobility: Secondary | ICD-10-CM | POA: Diagnosis not present

## 2022-07-18 DIAGNOSIS — L89322 Pressure ulcer of left buttock, stage 2: Secondary | ICD-10-CM | POA: Diagnosis not present

## 2022-07-18 DIAGNOSIS — R531 Weakness: Secondary | ICD-10-CM | POA: Diagnosis not present

## 2022-07-18 DIAGNOSIS — E876 Hypokalemia: Secondary | ICD-10-CM | POA: Diagnosis not present

## 2022-07-18 DIAGNOSIS — Z9181 History of falling: Secondary | ICD-10-CM | POA: Diagnosis not present

## 2022-07-18 DIAGNOSIS — J45909 Unspecified asthma, uncomplicated: Secondary | ICD-10-CM | POA: Diagnosis not present

## 2022-07-18 DIAGNOSIS — E1169 Type 2 diabetes mellitus with other specified complication: Secondary | ICD-10-CM | POA: Diagnosis not present

## 2022-07-18 DIAGNOSIS — I10 Essential (primary) hypertension: Secondary | ICD-10-CM | POA: Diagnosis not present

## 2022-07-18 DIAGNOSIS — E1151 Type 2 diabetes mellitus with diabetic peripheral angiopathy without gangrene: Secondary | ICD-10-CM | POA: Diagnosis not present

## 2022-07-18 DIAGNOSIS — E785 Hyperlipidemia, unspecified: Secondary | ICD-10-CM | POA: Diagnosis not present

## 2022-07-18 DIAGNOSIS — L89609 Pressure ulcer of unspecified heel, unspecified stage: Secondary | ICD-10-CM | POA: Diagnosis not present

## 2022-07-18 DIAGNOSIS — K760 Fatty (change of) liver, not elsewhere classified: Secondary | ICD-10-CM | POA: Diagnosis not present

## 2022-07-18 DIAGNOSIS — N390 Urinary tract infection, site not specified: Secondary | ICD-10-CM | POA: Diagnosis not present

## 2022-07-18 DIAGNOSIS — E039 Hypothyroidism, unspecified: Secondary | ICD-10-CM | POA: Diagnosis not present

## 2022-07-18 DIAGNOSIS — L89620 Pressure ulcer of left heel, unstageable: Secondary | ICD-10-CM | POA: Diagnosis not present

## 2022-07-18 DIAGNOSIS — L89892 Pressure ulcer of other site, stage 2: Secondary | ICD-10-CM | POA: Diagnosis not present

## 2022-07-18 DIAGNOSIS — E559 Vitamin D deficiency, unspecified: Secondary | ICD-10-CM | POA: Diagnosis not present

## 2022-07-19 DIAGNOSIS — M6281 Muscle weakness (generalized): Secondary | ICD-10-CM | POA: Diagnosis not present

## 2022-08-18 DIAGNOSIS — M6281 Muscle weakness (generalized): Secondary | ICD-10-CM | POA: Diagnosis not present

## 2022-08-26 DIAGNOSIS — E1140 Type 2 diabetes mellitus with diabetic neuropathy, unspecified: Secondary | ICD-10-CM | POA: Diagnosis not present

## 2022-08-26 DIAGNOSIS — I10 Essential (primary) hypertension: Secondary | ICD-10-CM | POA: Diagnosis not present

## 2022-08-26 DIAGNOSIS — Z7409 Other reduced mobility: Secondary | ICD-10-CM | POA: Diagnosis not present

## 2022-08-26 DIAGNOSIS — M159 Polyosteoarthritis, unspecified: Secondary | ICD-10-CM | POA: Diagnosis not present

## 2022-08-26 DIAGNOSIS — R413 Other amnesia: Secondary | ICD-10-CM | POA: Diagnosis not present

## 2022-08-26 DIAGNOSIS — R531 Weakness: Secondary | ICD-10-CM | POA: Diagnosis not present

## 2022-08-26 DIAGNOSIS — E782 Mixed hyperlipidemia: Secondary | ICD-10-CM | POA: Diagnosis not present

## 2022-08-26 DIAGNOSIS — R6889 Other general symptoms and signs: Secondary | ICD-10-CM | POA: Diagnosis not present

## 2022-08-26 DIAGNOSIS — L89609 Pressure ulcer of unspecified heel, unspecified stage: Secondary | ICD-10-CM | POA: Diagnosis not present

## 2022-08-26 DIAGNOSIS — R296 Repeated falls: Secondary | ICD-10-CM | POA: Diagnosis not present

## 2022-08-26 DIAGNOSIS — Z681 Body mass index (BMI) 19 or less, adult: Secondary | ICD-10-CM | POA: Diagnosis not present

## 2022-08-28 ENCOUNTER — Other Ambulatory Visit: Payer: Self-pay | Admitting: Cardiology

## 2022-08-29 DIAGNOSIS — I2510 Atherosclerotic heart disease of native coronary artery without angina pectoris: Secondary | ICD-10-CM | POA: Diagnosis not present

## 2022-08-29 DIAGNOSIS — E1151 Type 2 diabetes mellitus with diabetic peripheral angiopathy without gangrene: Secondary | ICD-10-CM | POA: Diagnosis not present

## 2022-08-29 DIAGNOSIS — Z7984 Long term (current) use of oral hypoglycemic drugs: Secondary | ICD-10-CM | POA: Diagnosis not present

## 2022-08-29 DIAGNOSIS — F32A Depression, unspecified: Secondary | ICD-10-CM | POA: Diagnosis not present

## 2022-08-29 DIAGNOSIS — R569 Unspecified convulsions: Secondary | ICD-10-CM | POA: Diagnosis not present

## 2022-08-29 DIAGNOSIS — Z7902 Long term (current) use of antithrombotics/antiplatelets: Secondary | ICD-10-CM | POA: Diagnosis not present

## 2022-08-29 DIAGNOSIS — Z87440 Personal history of urinary (tract) infections: Secondary | ICD-10-CM | POA: Diagnosis not present

## 2022-08-29 DIAGNOSIS — R413 Other amnesia: Secondary | ICD-10-CM | POA: Diagnosis not present

## 2022-08-29 DIAGNOSIS — Z87891 Personal history of nicotine dependence: Secondary | ICD-10-CM | POA: Diagnosis not present

## 2022-08-29 DIAGNOSIS — L853 Xerosis cutis: Secondary | ICD-10-CM | POA: Diagnosis not present

## 2022-08-29 DIAGNOSIS — E039 Hypothyroidism, unspecified: Secondary | ICD-10-CM | POA: Diagnosis not present

## 2022-08-29 DIAGNOSIS — E538 Deficiency of other specified B group vitamins: Secondary | ICD-10-CM | POA: Diagnosis not present

## 2022-08-29 DIAGNOSIS — E782 Mixed hyperlipidemia: Secondary | ICD-10-CM | POA: Diagnosis not present

## 2022-08-29 DIAGNOSIS — Z7901 Long term (current) use of anticoagulants: Secondary | ICD-10-CM | POA: Diagnosis not present

## 2022-08-29 DIAGNOSIS — I480 Paroxysmal atrial fibrillation: Secondary | ICD-10-CM | POA: Diagnosis not present

## 2022-08-29 DIAGNOSIS — J4520 Mild intermittent asthma, uncomplicated: Secondary | ICD-10-CM | POA: Diagnosis not present

## 2022-08-29 DIAGNOSIS — I252 Old myocardial infarction: Secondary | ICD-10-CM | POA: Diagnosis not present

## 2022-08-29 DIAGNOSIS — J309 Allergic rhinitis, unspecified: Secondary | ICD-10-CM | POA: Diagnosis not present

## 2022-08-29 DIAGNOSIS — E1142 Type 2 diabetes mellitus with diabetic polyneuropathy: Secondary | ICD-10-CM | POA: Diagnosis not present

## 2022-08-29 DIAGNOSIS — I10 Essential (primary) hypertension: Secondary | ICD-10-CM | POA: Diagnosis not present

## 2022-08-29 DIAGNOSIS — M159 Polyosteoarthritis, unspecified: Secondary | ICD-10-CM | POA: Diagnosis not present

## 2022-08-29 DIAGNOSIS — K760 Fatty (change of) liver, not elsewhere classified: Secondary | ICD-10-CM | POA: Diagnosis not present

## 2022-08-29 DIAGNOSIS — E559 Vitamin D deficiency, unspecified: Secondary | ICD-10-CM | POA: Diagnosis not present

## 2022-09-02 DIAGNOSIS — K760 Fatty (change of) liver, not elsewhere classified: Secondary | ICD-10-CM | POA: Diagnosis not present

## 2022-09-02 DIAGNOSIS — R569 Unspecified convulsions: Secondary | ICD-10-CM | POA: Diagnosis not present

## 2022-09-02 DIAGNOSIS — L853 Xerosis cutis: Secondary | ICD-10-CM | POA: Diagnosis not present

## 2022-09-02 DIAGNOSIS — I2510 Atherosclerotic heart disease of native coronary artery without angina pectoris: Secondary | ICD-10-CM | POA: Diagnosis not present

## 2022-09-02 DIAGNOSIS — Z7901 Long term (current) use of anticoagulants: Secondary | ICD-10-CM | POA: Diagnosis not present

## 2022-09-02 DIAGNOSIS — J309 Allergic rhinitis, unspecified: Secondary | ICD-10-CM | POA: Diagnosis not present

## 2022-09-02 DIAGNOSIS — I252 Old myocardial infarction: Secondary | ICD-10-CM | POA: Diagnosis not present

## 2022-09-02 DIAGNOSIS — J4520 Mild intermittent asthma, uncomplicated: Secondary | ICD-10-CM | POA: Diagnosis not present

## 2022-09-02 DIAGNOSIS — F32A Depression, unspecified: Secondary | ICD-10-CM | POA: Diagnosis not present

## 2022-09-02 DIAGNOSIS — E782 Mixed hyperlipidemia: Secondary | ICD-10-CM | POA: Diagnosis not present

## 2022-09-02 DIAGNOSIS — I10 Essential (primary) hypertension: Secondary | ICD-10-CM | POA: Diagnosis not present

## 2022-09-02 DIAGNOSIS — I480 Paroxysmal atrial fibrillation: Secondary | ICD-10-CM | POA: Diagnosis not present

## 2022-09-02 DIAGNOSIS — E538 Deficiency of other specified B group vitamins: Secondary | ICD-10-CM | POA: Diagnosis not present

## 2022-09-02 DIAGNOSIS — M159 Polyosteoarthritis, unspecified: Secondary | ICD-10-CM | POA: Diagnosis not present

## 2022-09-02 DIAGNOSIS — E1151 Type 2 diabetes mellitus with diabetic peripheral angiopathy without gangrene: Secondary | ICD-10-CM | POA: Diagnosis not present

## 2022-09-02 DIAGNOSIS — R413 Other amnesia: Secondary | ICD-10-CM | POA: Diagnosis not present

## 2022-09-02 DIAGNOSIS — E1142 Type 2 diabetes mellitus with diabetic polyneuropathy: Secondary | ICD-10-CM | POA: Diagnosis not present

## 2022-09-02 DIAGNOSIS — Z87891 Personal history of nicotine dependence: Secondary | ICD-10-CM | POA: Diagnosis not present

## 2022-09-02 DIAGNOSIS — E039 Hypothyroidism, unspecified: Secondary | ICD-10-CM | POA: Diagnosis not present

## 2022-09-02 DIAGNOSIS — Z7984 Long term (current) use of oral hypoglycemic drugs: Secondary | ICD-10-CM | POA: Diagnosis not present

## 2022-09-02 DIAGNOSIS — Z7902 Long term (current) use of antithrombotics/antiplatelets: Secondary | ICD-10-CM | POA: Diagnosis not present

## 2022-09-02 DIAGNOSIS — Z87440 Personal history of urinary (tract) infections: Secondary | ICD-10-CM | POA: Diagnosis not present

## 2022-09-02 DIAGNOSIS — E559 Vitamin D deficiency, unspecified: Secondary | ICD-10-CM | POA: Diagnosis not present

## 2022-09-09 DIAGNOSIS — Z87440 Personal history of urinary (tract) infections: Secondary | ICD-10-CM | POA: Diagnosis not present

## 2022-09-09 DIAGNOSIS — I252 Old myocardial infarction: Secondary | ICD-10-CM | POA: Diagnosis not present

## 2022-09-09 DIAGNOSIS — K760 Fatty (change of) liver, not elsewhere classified: Secondary | ICD-10-CM | POA: Diagnosis not present

## 2022-09-09 DIAGNOSIS — E039 Hypothyroidism, unspecified: Secondary | ICD-10-CM | POA: Diagnosis not present

## 2022-09-09 DIAGNOSIS — E782 Mixed hyperlipidemia: Secondary | ICD-10-CM | POA: Diagnosis not present

## 2022-09-09 DIAGNOSIS — I10 Essential (primary) hypertension: Secondary | ICD-10-CM | POA: Diagnosis not present

## 2022-09-09 DIAGNOSIS — Z7902 Long term (current) use of antithrombotics/antiplatelets: Secondary | ICD-10-CM | POA: Diagnosis not present

## 2022-09-09 DIAGNOSIS — F32A Depression, unspecified: Secondary | ICD-10-CM | POA: Diagnosis not present

## 2022-09-09 DIAGNOSIS — Z7901 Long term (current) use of anticoagulants: Secondary | ICD-10-CM | POA: Diagnosis not present

## 2022-09-09 DIAGNOSIS — R569 Unspecified convulsions: Secondary | ICD-10-CM | POA: Diagnosis not present

## 2022-09-09 DIAGNOSIS — E538 Deficiency of other specified B group vitamins: Secondary | ICD-10-CM | POA: Diagnosis not present

## 2022-09-09 DIAGNOSIS — R413 Other amnesia: Secondary | ICD-10-CM | POA: Diagnosis not present

## 2022-09-09 DIAGNOSIS — E559 Vitamin D deficiency, unspecified: Secondary | ICD-10-CM | POA: Diagnosis not present

## 2022-09-09 DIAGNOSIS — Z87891 Personal history of nicotine dependence: Secondary | ICD-10-CM | POA: Diagnosis not present

## 2022-09-09 DIAGNOSIS — Z7984 Long term (current) use of oral hypoglycemic drugs: Secondary | ICD-10-CM | POA: Diagnosis not present

## 2022-09-09 DIAGNOSIS — E1151 Type 2 diabetes mellitus with diabetic peripheral angiopathy without gangrene: Secondary | ICD-10-CM | POA: Diagnosis not present

## 2022-09-09 DIAGNOSIS — I2510 Atherosclerotic heart disease of native coronary artery without angina pectoris: Secondary | ICD-10-CM | POA: Diagnosis not present

## 2022-09-09 DIAGNOSIS — E1142 Type 2 diabetes mellitus with diabetic polyneuropathy: Secondary | ICD-10-CM | POA: Diagnosis not present

## 2022-09-09 DIAGNOSIS — I480 Paroxysmal atrial fibrillation: Secondary | ICD-10-CM | POA: Diagnosis not present

## 2022-09-09 DIAGNOSIS — J309 Allergic rhinitis, unspecified: Secondary | ICD-10-CM | POA: Diagnosis not present

## 2022-09-09 DIAGNOSIS — J4520 Mild intermittent asthma, uncomplicated: Secondary | ICD-10-CM | POA: Diagnosis not present

## 2022-09-09 DIAGNOSIS — L853 Xerosis cutis: Secondary | ICD-10-CM | POA: Diagnosis not present

## 2022-09-09 DIAGNOSIS — M159 Polyosteoarthritis, unspecified: Secondary | ICD-10-CM | POA: Diagnosis not present

## 2022-09-12 DIAGNOSIS — J4520 Mild intermittent asthma, uncomplicated: Secondary | ICD-10-CM | POA: Diagnosis not present

## 2022-09-12 DIAGNOSIS — J309 Allergic rhinitis, unspecified: Secondary | ICD-10-CM | POA: Diagnosis not present

## 2022-09-12 DIAGNOSIS — Z87891 Personal history of nicotine dependence: Secondary | ICD-10-CM | POA: Diagnosis not present

## 2022-09-12 DIAGNOSIS — Z7902 Long term (current) use of antithrombotics/antiplatelets: Secondary | ICD-10-CM | POA: Diagnosis not present

## 2022-09-12 DIAGNOSIS — L853 Xerosis cutis: Secondary | ICD-10-CM | POA: Diagnosis not present

## 2022-09-12 DIAGNOSIS — I10 Essential (primary) hypertension: Secondary | ICD-10-CM | POA: Diagnosis not present

## 2022-09-12 DIAGNOSIS — K760 Fatty (change of) liver, not elsewhere classified: Secondary | ICD-10-CM | POA: Diagnosis not present

## 2022-09-12 DIAGNOSIS — E039 Hypothyroidism, unspecified: Secondary | ICD-10-CM | POA: Diagnosis not present

## 2022-09-12 DIAGNOSIS — E782 Mixed hyperlipidemia: Secondary | ICD-10-CM | POA: Diagnosis not present

## 2022-09-12 DIAGNOSIS — E1142 Type 2 diabetes mellitus with diabetic polyneuropathy: Secondary | ICD-10-CM | POA: Diagnosis not present

## 2022-09-12 DIAGNOSIS — I480 Paroxysmal atrial fibrillation: Secondary | ICD-10-CM | POA: Diagnosis not present

## 2022-09-12 DIAGNOSIS — Z7901 Long term (current) use of anticoagulants: Secondary | ICD-10-CM | POA: Diagnosis not present

## 2022-09-12 DIAGNOSIS — R569 Unspecified convulsions: Secondary | ICD-10-CM | POA: Diagnosis not present

## 2022-09-12 DIAGNOSIS — I252 Old myocardial infarction: Secondary | ICD-10-CM | POA: Diagnosis not present

## 2022-09-12 DIAGNOSIS — E1151 Type 2 diabetes mellitus with diabetic peripheral angiopathy without gangrene: Secondary | ICD-10-CM | POA: Diagnosis not present

## 2022-09-12 DIAGNOSIS — E538 Deficiency of other specified B group vitamins: Secondary | ICD-10-CM | POA: Diagnosis not present

## 2022-09-12 DIAGNOSIS — R413 Other amnesia: Secondary | ICD-10-CM | POA: Diagnosis not present

## 2022-09-12 DIAGNOSIS — Z87440 Personal history of urinary (tract) infections: Secondary | ICD-10-CM | POA: Diagnosis not present

## 2022-09-12 DIAGNOSIS — M159 Polyosteoarthritis, unspecified: Secondary | ICD-10-CM | POA: Diagnosis not present

## 2022-09-12 DIAGNOSIS — E559 Vitamin D deficiency, unspecified: Secondary | ICD-10-CM | POA: Diagnosis not present

## 2022-09-12 DIAGNOSIS — Z7984 Long term (current) use of oral hypoglycemic drugs: Secondary | ICD-10-CM | POA: Diagnosis not present

## 2022-09-12 DIAGNOSIS — I2510 Atherosclerotic heart disease of native coronary artery without angina pectoris: Secondary | ICD-10-CM | POA: Diagnosis not present

## 2022-09-13 DIAGNOSIS — I10 Essential (primary) hypertension: Secondary | ICD-10-CM | POA: Diagnosis not present

## 2022-09-13 DIAGNOSIS — E039 Hypothyroidism, unspecified: Secondary | ICD-10-CM | POA: Diagnosis not present

## 2022-09-13 DIAGNOSIS — E1151 Type 2 diabetes mellitus with diabetic peripheral angiopathy without gangrene: Secondary | ICD-10-CM | POA: Diagnosis not present

## 2022-09-13 DIAGNOSIS — L853 Xerosis cutis: Secondary | ICD-10-CM | POA: Diagnosis not present

## 2022-09-13 DIAGNOSIS — I480 Paroxysmal atrial fibrillation: Secondary | ICD-10-CM | POA: Diagnosis not present

## 2022-09-13 DIAGNOSIS — J4520 Mild intermittent asthma, uncomplicated: Secondary | ICD-10-CM | POA: Diagnosis not present

## 2022-09-13 DIAGNOSIS — E782 Mixed hyperlipidemia: Secondary | ICD-10-CM | POA: Diagnosis not present

## 2022-09-13 DIAGNOSIS — I2510 Atherosclerotic heart disease of native coronary artery without angina pectoris: Secondary | ICD-10-CM | POA: Diagnosis not present

## 2022-09-13 DIAGNOSIS — R569 Unspecified convulsions: Secondary | ICD-10-CM | POA: Diagnosis not present

## 2022-09-13 DIAGNOSIS — K760 Fatty (change of) liver, not elsewhere classified: Secondary | ICD-10-CM | POA: Diagnosis not present

## 2022-09-13 DIAGNOSIS — Z7901 Long term (current) use of anticoagulants: Secondary | ICD-10-CM | POA: Diagnosis not present

## 2022-09-13 DIAGNOSIS — E559 Vitamin D deficiency, unspecified: Secondary | ICD-10-CM | POA: Diagnosis not present

## 2022-09-13 DIAGNOSIS — E538 Deficiency of other specified B group vitamins: Secondary | ICD-10-CM | POA: Diagnosis not present

## 2022-09-13 DIAGNOSIS — Z7902 Long term (current) use of antithrombotics/antiplatelets: Secondary | ICD-10-CM | POA: Diagnosis not present

## 2022-09-13 DIAGNOSIS — Z87440 Personal history of urinary (tract) infections: Secondary | ICD-10-CM | POA: Diagnosis not present

## 2022-09-13 DIAGNOSIS — J309 Allergic rhinitis, unspecified: Secondary | ICD-10-CM | POA: Diagnosis not present

## 2022-09-13 DIAGNOSIS — E1142 Type 2 diabetes mellitus with diabetic polyneuropathy: Secondary | ICD-10-CM | POA: Diagnosis not present

## 2022-09-13 DIAGNOSIS — M159 Polyosteoarthritis, unspecified: Secondary | ICD-10-CM | POA: Diagnosis not present

## 2022-09-13 DIAGNOSIS — R413 Other amnesia: Secondary | ICD-10-CM | POA: Diagnosis not present

## 2022-09-13 DIAGNOSIS — I252 Old myocardial infarction: Secondary | ICD-10-CM | POA: Diagnosis not present

## 2022-09-13 DIAGNOSIS — Z87891 Personal history of nicotine dependence: Secondary | ICD-10-CM | POA: Diagnosis not present

## 2022-09-13 DIAGNOSIS — Z7984 Long term (current) use of oral hypoglycemic drugs: Secondary | ICD-10-CM | POA: Diagnosis not present

## 2022-09-16 DIAGNOSIS — R42 Dizziness and giddiness: Secondary | ICD-10-CM | POA: Diagnosis not present

## 2022-09-16 DIAGNOSIS — E782 Mixed hyperlipidemia: Secondary | ICD-10-CM | POA: Diagnosis not present

## 2022-09-16 DIAGNOSIS — I10 Essential (primary) hypertension: Secondary | ICD-10-CM | POA: Diagnosis not present

## 2022-09-16 DIAGNOSIS — E1140 Type 2 diabetes mellitus with diabetic neuropathy, unspecified: Secondary | ICD-10-CM | POA: Diagnosis not present

## 2022-09-16 DIAGNOSIS — L89609 Pressure ulcer of unspecified heel, unspecified stage: Secondary | ICD-10-CM | POA: Diagnosis not present

## 2022-09-16 DIAGNOSIS — E039 Hypothyroidism, unspecified: Secondary | ICD-10-CM | POA: Diagnosis not present

## 2022-09-16 DIAGNOSIS — Z681 Body mass index (BMI) 19 or less, adult: Secondary | ICD-10-CM | POA: Diagnosis not present

## 2022-09-18 DIAGNOSIS — E039 Hypothyroidism, unspecified: Secondary | ICD-10-CM | POA: Diagnosis not present

## 2022-09-18 DIAGNOSIS — L853 Xerosis cutis: Secondary | ICD-10-CM | POA: Diagnosis not present

## 2022-09-18 DIAGNOSIS — E1151 Type 2 diabetes mellitus with diabetic peripheral angiopathy without gangrene: Secondary | ICD-10-CM | POA: Diagnosis not present

## 2022-09-18 DIAGNOSIS — R413 Other amnesia: Secondary | ICD-10-CM | POA: Diagnosis not present

## 2022-09-18 DIAGNOSIS — E1142 Type 2 diabetes mellitus with diabetic polyneuropathy: Secondary | ICD-10-CM | POA: Diagnosis not present

## 2022-09-18 DIAGNOSIS — M159 Polyosteoarthritis, unspecified: Secondary | ICD-10-CM | POA: Diagnosis not present

## 2022-09-18 DIAGNOSIS — E538 Deficiency of other specified B group vitamins: Secondary | ICD-10-CM | POA: Diagnosis not present

## 2022-09-18 DIAGNOSIS — I252 Old myocardial infarction: Secondary | ICD-10-CM | POA: Diagnosis not present

## 2022-09-18 DIAGNOSIS — I480 Paroxysmal atrial fibrillation: Secondary | ICD-10-CM | POA: Diagnosis not present

## 2022-09-18 DIAGNOSIS — I10 Essential (primary) hypertension: Secondary | ICD-10-CM | POA: Diagnosis not present

## 2022-09-18 DIAGNOSIS — M6281 Muscle weakness (generalized): Secondary | ICD-10-CM | POA: Diagnosis not present

## 2022-09-18 DIAGNOSIS — Z7984 Long term (current) use of oral hypoglycemic drugs: Secondary | ICD-10-CM | POA: Diagnosis not present

## 2022-09-18 DIAGNOSIS — R569 Unspecified convulsions: Secondary | ICD-10-CM | POA: Diagnosis not present

## 2022-09-18 DIAGNOSIS — E782 Mixed hyperlipidemia: Secondary | ICD-10-CM | POA: Diagnosis not present

## 2022-09-18 DIAGNOSIS — E559 Vitamin D deficiency, unspecified: Secondary | ICD-10-CM | POA: Diagnosis not present

## 2022-09-18 DIAGNOSIS — Z7901 Long term (current) use of anticoagulants: Secondary | ICD-10-CM | POA: Diagnosis not present

## 2022-09-18 DIAGNOSIS — I2510 Atherosclerotic heart disease of native coronary artery without angina pectoris: Secondary | ICD-10-CM | POA: Diagnosis not present

## 2022-09-18 DIAGNOSIS — J4520 Mild intermittent asthma, uncomplicated: Secondary | ICD-10-CM | POA: Diagnosis not present

## 2022-09-18 DIAGNOSIS — Z87891 Personal history of nicotine dependence: Secondary | ICD-10-CM | POA: Diagnosis not present

## 2022-09-18 DIAGNOSIS — Z7902 Long term (current) use of antithrombotics/antiplatelets: Secondary | ICD-10-CM | POA: Diagnosis not present

## 2022-09-18 DIAGNOSIS — J309 Allergic rhinitis, unspecified: Secondary | ICD-10-CM | POA: Diagnosis not present

## 2022-09-18 DIAGNOSIS — K760 Fatty (change of) liver, not elsewhere classified: Secondary | ICD-10-CM | POA: Diagnosis not present

## 2022-09-18 DIAGNOSIS — Z87440 Personal history of urinary (tract) infections: Secondary | ICD-10-CM | POA: Diagnosis not present

## 2022-09-25 DIAGNOSIS — J4520 Mild intermittent asthma, uncomplicated: Secondary | ICD-10-CM | POA: Diagnosis not present

## 2022-09-25 DIAGNOSIS — I480 Paroxysmal atrial fibrillation: Secondary | ICD-10-CM | POA: Diagnosis not present

## 2022-09-25 DIAGNOSIS — E782 Mixed hyperlipidemia: Secondary | ICD-10-CM | POA: Diagnosis not present

## 2022-09-25 DIAGNOSIS — K760 Fatty (change of) liver, not elsewhere classified: Secondary | ICD-10-CM | POA: Diagnosis not present

## 2022-09-25 DIAGNOSIS — E1151 Type 2 diabetes mellitus with diabetic peripheral angiopathy without gangrene: Secondary | ICD-10-CM | POA: Diagnosis not present

## 2022-09-25 DIAGNOSIS — Z7901 Long term (current) use of anticoagulants: Secondary | ICD-10-CM | POA: Diagnosis not present

## 2022-09-25 DIAGNOSIS — R569 Unspecified convulsions: Secondary | ICD-10-CM | POA: Diagnosis not present

## 2022-09-25 DIAGNOSIS — I2510 Atherosclerotic heart disease of native coronary artery without angina pectoris: Secondary | ICD-10-CM | POA: Diagnosis not present

## 2022-09-25 DIAGNOSIS — E039 Hypothyroidism, unspecified: Secondary | ICD-10-CM | POA: Diagnosis not present

## 2022-09-25 DIAGNOSIS — E1142 Type 2 diabetes mellitus with diabetic polyneuropathy: Secondary | ICD-10-CM | POA: Diagnosis not present

## 2022-09-25 DIAGNOSIS — J309 Allergic rhinitis, unspecified: Secondary | ICD-10-CM | POA: Diagnosis not present

## 2022-09-25 DIAGNOSIS — Z7902 Long term (current) use of antithrombotics/antiplatelets: Secondary | ICD-10-CM | POA: Diagnosis not present

## 2022-09-25 DIAGNOSIS — L853 Xerosis cutis: Secondary | ICD-10-CM | POA: Diagnosis not present

## 2022-09-25 DIAGNOSIS — R413 Other amnesia: Secondary | ICD-10-CM | POA: Diagnosis not present

## 2022-09-25 DIAGNOSIS — E538 Deficiency of other specified B group vitamins: Secondary | ICD-10-CM | POA: Diagnosis not present

## 2022-09-25 DIAGNOSIS — I10 Essential (primary) hypertension: Secondary | ICD-10-CM | POA: Diagnosis not present

## 2022-09-25 DIAGNOSIS — Z7984 Long term (current) use of oral hypoglycemic drugs: Secondary | ICD-10-CM | POA: Diagnosis not present

## 2022-09-25 DIAGNOSIS — E559 Vitamin D deficiency, unspecified: Secondary | ICD-10-CM | POA: Diagnosis not present

## 2022-09-25 DIAGNOSIS — M159 Polyosteoarthritis, unspecified: Secondary | ICD-10-CM | POA: Diagnosis not present

## 2022-09-25 DIAGNOSIS — I252 Old myocardial infarction: Secondary | ICD-10-CM | POA: Diagnosis not present

## 2022-09-25 DIAGNOSIS — Z87891 Personal history of nicotine dependence: Secondary | ICD-10-CM | POA: Diagnosis not present

## 2022-09-25 DIAGNOSIS — Z87440 Personal history of urinary (tract) infections: Secondary | ICD-10-CM | POA: Diagnosis not present

## 2022-09-26 DIAGNOSIS — Z7984 Long term (current) use of oral hypoglycemic drugs: Secondary | ICD-10-CM | POA: Diagnosis not present

## 2022-09-26 DIAGNOSIS — Z87891 Personal history of nicotine dependence: Secondary | ICD-10-CM | POA: Diagnosis not present

## 2022-09-26 DIAGNOSIS — Z7901 Long term (current) use of anticoagulants: Secondary | ICD-10-CM | POA: Diagnosis not present

## 2022-09-26 DIAGNOSIS — R413 Other amnesia: Secondary | ICD-10-CM | POA: Diagnosis not present

## 2022-09-26 DIAGNOSIS — E1142 Type 2 diabetes mellitus with diabetic polyneuropathy: Secondary | ICD-10-CM | POA: Diagnosis not present

## 2022-09-26 DIAGNOSIS — E039 Hypothyroidism, unspecified: Secondary | ICD-10-CM | POA: Diagnosis not present

## 2022-09-26 DIAGNOSIS — I10 Essential (primary) hypertension: Secondary | ICD-10-CM | POA: Diagnosis not present

## 2022-09-26 DIAGNOSIS — L853 Xerosis cutis: Secondary | ICD-10-CM | POA: Diagnosis not present

## 2022-09-26 DIAGNOSIS — K760 Fatty (change of) liver, not elsewhere classified: Secondary | ICD-10-CM | POA: Diagnosis not present

## 2022-09-26 DIAGNOSIS — I2510 Atherosclerotic heart disease of native coronary artery without angina pectoris: Secondary | ICD-10-CM | POA: Diagnosis not present

## 2022-09-26 DIAGNOSIS — E559 Vitamin D deficiency, unspecified: Secondary | ICD-10-CM | POA: Diagnosis not present

## 2022-09-26 DIAGNOSIS — J309 Allergic rhinitis, unspecified: Secondary | ICD-10-CM | POA: Diagnosis not present

## 2022-09-26 DIAGNOSIS — Z7902 Long term (current) use of antithrombotics/antiplatelets: Secondary | ICD-10-CM | POA: Diagnosis not present

## 2022-09-26 DIAGNOSIS — I252 Old myocardial infarction: Secondary | ICD-10-CM | POA: Diagnosis not present

## 2022-09-26 DIAGNOSIS — E782 Mixed hyperlipidemia: Secondary | ICD-10-CM | POA: Diagnosis not present

## 2022-09-26 DIAGNOSIS — M159 Polyosteoarthritis, unspecified: Secondary | ICD-10-CM | POA: Diagnosis not present

## 2022-09-26 DIAGNOSIS — I480 Paroxysmal atrial fibrillation: Secondary | ICD-10-CM | POA: Diagnosis not present

## 2022-09-26 DIAGNOSIS — E1151 Type 2 diabetes mellitus with diabetic peripheral angiopathy without gangrene: Secondary | ICD-10-CM | POA: Diagnosis not present

## 2022-09-26 DIAGNOSIS — Z87440 Personal history of urinary (tract) infections: Secondary | ICD-10-CM | POA: Diagnosis not present

## 2022-09-26 DIAGNOSIS — J4520 Mild intermittent asthma, uncomplicated: Secondary | ICD-10-CM | POA: Diagnosis not present

## 2022-09-26 DIAGNOSIS — R569 Unspecified convulsions: Secondary | ICD-10-CM | POA: Diagnosis not present

## 2022-09-26 DIAGNOSIS — E538 Deficiency of other specified B group vitamins: Secondary | ICD-10-CM | POA: Diagnosis not present

## 2022-10-02 DIAGNOSIS — Z7984 Long term (current) use of oral hypoglycemic drugs: Secondary | ICD-10-CM | POA: Diagnosis not present

## 2022-10-02 DIAGNOSIS — Z87440 Personal history of urinary (tract) infections: Secondary | ICD-10-CM | POA: Diagnosis not present

## 2022-10-02 DIAGNOSIS — E1142 Type 2 diabetes mellitus with diabetic polyneuropathy: Secondary | ICD-10-CM | POA: Diagnosis not present

## 2022-10-02 DIAGNOSIS — I480 Paroxysmal atrial fibrillation: Secondary | ICD-10-CM | POA: Diagnosis not present

## 2022-10-02 DIAGNOSIS — E782 Mixed hyperlipidemia: Secondary | ICD-10-CM | POA: Diagnosis not present

## 2022-10-02 DIAGNOSIS — K760 Fatty (change of) liver, not elsewhere classified: Secondary | ICD-10-CM | POA: Diagnosis not present

## 2022-10-02 DIAGNOSIS — E559 Vitamin D deficiency, unspecified: Secondary | ICD-10-CM | POA: Diagnosis not present

## 2022-10-02 DIAGNOSIS — J4520 Mild intermittent asthma, uncomplicated: Secondary | ICD-10-CM | POA: Diagnosis not present

## 2022-10-02 DIAGNOSIS — E039 Hypothyroidism, unspecified: Secondary | ICD-10-CM | POA: Diagnosis not present

## 2022-10-02 DIAGNOSIS — I10 Essential (primary) hypertension: Secondary | ICD-10-CM | POA: Diagnosis not present

## 2022-10-02 DIAGNOSIS — Z7901 Long term (current) use of anticoagulants: Secondary | ICD-10-CM | POA: Diagnosis not present

## 2022-10-02 DIAGNOSIS — Z7902 Long term (current) use of antithrombotics/antiplatelets: Secondary | ICD-10-CM | POA: Diagnosis not present

## 2022-10-02 DIAGNOSIS — E538 Deficiency of other specified B group vitamins: Secondary | ICD-10-CM | POA: Diagnosis not present

## 2022-10-02 DIAGNOSIS — R569 Unspecified convulsions: Secondary | ICD-10-CM | POA: Diagnosis not present

## 2022-10-02 DIAGNOSIS — I252 Old myocardial infarction: Secondary | ICD-10-CM | POA: Diagnosis not present

## 2022-10-02 DIAGNOSIS — E1151 Type 2 diabetes mellitus with diabetic peripheral angiopathy without gangrene: Secondary | ICD-10-CM | POA: Diagnosis not present

## 2022-10-02 DIAGNOSIS — M159 Polyosteoarthritis, unspecified: Secondary | ICD-10-CM | POA: Diagnosis not present

## 2022-10-02 DIAGNOSIS — R413 Other amnesia: Secondary | ICD-10-CM | POA: Diagnosis not present

## 2022-10-02 DIAGNOSIS — I2510 Atherosclerotic heart disease of native coronary artery without angina pectoris: Secondary | ICD-10-CM | POA: Diagnosis not present

## 2022-10-02 DIAGNOSIS — L853 Xerosis cutis: Secondary | ICD-10-CM | POA: Diagnosis not present

## 2022-10-02 DIAGNOSIS — J309 Allergic rhinitis, unspecified: Secondary | ICD-10-CM | POA: Diagnosis not present

## 2022-10-02 DIAGNOSIS — Z87891 Personal history of nicotine dependence: Secondary | ICD-10-CM | POA: Diagnosis not present

## 2022-10-03 DIAGNOSIS — E782 Mixed hyperlipidemia: Secondary | ICD-10-CM | POA: Diagnosis not present

## 2022-10-03 DIAGNOSIS — R569 Unspecified convulsions: Secondary | ICD-10-CM | POA: Diagnosis not present

## 2022-10-03 DIAGNOSIS — J309 Allergic rhinitis, unspecified: Secondary | ICD-10-CM | POA: Diagnosis not present

## 2022-10-03 DIAGNOSIS — E538 Deficiency of other specified B group vitamins: Secondary | ICD-10-CM | POA: Diagnosis not present

## 2022-10-03 DIAGNOSIS — Z7984 Long term (current) use of oral hypoglycemic drugs: Secondary | ICD-10-CM | POA: Diagnosis not present

## 2022-10-03 DIAGNOSIS — E039 Hypothyroidism, unspecified: Secondary | ICD-10-CM | POA: Diagnosis not present

## 2022-10-03 DIAGNOSIS — Z87440 Personal history of urinary (tract) infections: Secondary | ICD-10-CM | POA: Diagnosis not present

## 2022-10-03 DIAGNOSIS — Z87891 Personal history of nicotine dependence: Secondary | ICD-10-CM | POA: Diagnosis not present

## 2022-10-03 DIAGNOSIS — R413 Other amnesia: Secondary | ICD-10-CM | POA: Diagnosis not present

## 2022-10-03 DIAGNOSIS — E559 Vitamin D deficiency, unspecified: Secondary | ICD-10-CM | POA: Diagnosis not present

## 2022-10-03 DIAGNOSIS — E1151 Type 2 diabetes mellitus with diabetic peripheral angiopathy without gangrene: Secondary | ICD-10-CM | POA: Diagnosis not present

## 2022-10-03 DIAGNOSIS — Z7901 Long term (current) use of anticoagulants: Secondary | ICD-10-CM | POA: Diagnosis not present

## 2022-10-03 DIAGNOSIS — I10 Essential (primary) hypertension: Secondary | ICD-10-CM | POA: Diagnosis not present

## 2022-10-03 DIAGNOSIS — Z7902 Long term (current) use of antithrombotics/antiplatelets: Secondary | ICD-10-CM | POA: Diagnosis not present

## 2022-10-03 DIAGNOSIS — L853 Xerosis cutis: Secondary | ICD-10-CM | POA: Diagnosis not present

## 2022-10-03 DIAGNOSIS — I2510 Atherosclerotic heart disease of native coronary artery without angina pectoris: Secondary | ICD-10-CM | POA: Diagnosis not present

## 2022-10-03 DIAGNOSIS — K760 Fatty (change of) liver, not elsewhere classified: Secondary | ICD-10-CM | POA: Diagnosis not present

## 2022-10-03 DIAGNOSIS — E1142 Type 2 diabetes mellitus with diabetic polyneuropathy: Secondary | ICD-10-CM | POA: Diagnosis not present

## 2022-10-03 DIAGNOSIS — I252 Old myocardial infarction: Secondary | ICD-10-CM | POA: Diagnosis not present

## 2022-10-03 DIAGNOSIS — J4520 Mild intermittent asthma, uncomplicated: Secondary | ICD-10-CM | POA: Diagnosis not present

## 2022-10-03 DIAGNOSIS — M159 Polyosteoarthritis, unspecified: Secondary | ICD-10-CM | POA: Diagnosis not present

## 2022-10-03 DIAGNOSIS — I480 Paroxysmal atrial fibrillation: Secondary | ICD-10-CM | POA: Diagnosis not present

## 2022-10-10 DIAGNOSIS — I480 Paroxysmal atrial fibrillation: Secondary | ICD-10-CM | POA: Diagnosis not present

## 2022-10-10 DIAGNOSIS — R569 Unspecified convulsions: Secondary | ICD-10-CM | POA: Diagnosis not present

## 2022-10-10 DIAGNOSIS — E1142 Type 2 diabetes mellitus with diabetic polyneuropathy: Secondary | ICD-10-CM | POA: Diagnosis not present

## 2022-10-10 DIAGNOSIS — J4520 Mild intermittent asthma, uncomplicated: Secondary | ICD-10-CM | POA: Diagnosis not present

## 2022-10-10 DIAGNOSIS — I252 Old myocardial infarction: Secondary | ICD-10-CM | POA: Diagnosis not present

## 2022-10-10 DIAGNOSIS — Z7984 Long term (current) use of oral hypoglycemic drugs: Secondary | ICD-10-CM | POA: Diagnosis not present

## 2022-10-10 DIAGNOSIS — E039 Hypothyroidism, unspecified: Secondary | ICD-10-CM | POA: Diagnosis not present

## 2022-10-10 DIAGNOSIS — Z7902 Long term (current) use of antithrombotics/antiplatelets: Secondary | ICD-10-CM | POA: Diagnosis not present

## 2022-10-10 DIAGNOSIS — I10 Essential (primary) hypertension: Secondary | ICD-10-CM | POA: Diagnosis not present

## 2022-10-10 DIAGNOSIS — Z7901 Long term (current) use of anticoagulants: Secondary | ICD-10-CM | POA: Diagnosis not present

## 2022-10-10 DIAGNOSIS — M159 Polyosteoarthritis, unspecified: Secondary | ICD-10-CM | POA: Diagnosis not present

## 2022-10-10 DIAGNOSIS — E538 Deficiency of other specified B group vitamins: Secondary | ICD-10-CM | POA: Diagnosis not present

## 2022-10-10 DIAGNOSIS — K760 Fatty (change of) liver, not elsewhere classified: Secondary | ICD-10-CM | POA: Diagnosis not present

## 2022-10-10 DIAGNOSIS — E782 Mixed hyperlipidemia: Secondary | ICD-10-CM | POA: Diagnosis not present

## 2022-10-10 DIAGNOSIS — E559 Vitamin D deficiency, unspecified: Secondary | ICD-10-CM | POA: Diagnosis not present

## 2022-10-10 DIAGNOSIS — I2510 Atherosclerotic heart disease of native coronary artery without angina pectoris: Secondary | ICD-10-CM | POA: Diagnosis not present

## 2022-10-10 DIAGNOSIS — R413 Other amnesia: Secondary | ICD-10-CM | POA: Diagnosis not present

## 2022-10-10 DIAGNOSIS — J309 Allergic rhinitis, unspecified: Secondary | ICD-10-CM | POA: Diagnosis not present

## 2022-10-10 DIAGNOSIS — L853 Xerosis cutis: Secondary | ICD-10-CM | POA: Diagnosis not present

## 2022-10-10 DIAGNOSIS — Z87440 Personal history of urinary (tract) infections: Secondary | ICD-10-CM | POA: Diagnosis not present

## 2022-10-10 DIAGNOSIS — E1151 Type 2 diabetes mellitus with diabetic peripheral angiopathy without gangrene: Secondary | ICD-10-CM | POA: Diagnosis not present

## 2022-10-10 DIAGNOSIS — Z87891 Personal history of nicotine dependence: Secondary | ICD-10-CM | POA: Diagnosis not present

## 2022-10-16 ENCOUNTER — Other Ambulatory Visit: Payer: Self-pay | Admitting: Cardiology

## 2022-10-16 DIAGNOSIS — L853 Xerosis cutis: Secondary | ICD-10-CM | POA: Diagnosis not present

## 2022-10-16 DIAGNOSIS — E538 Deficiency of other specified B group vitamins: Secondary | ICD-10-CM | POA: Diagnosis not present

## 2022-10-16 DIAGNOSIS — J309 Allergic rhinitis, unspecified: Secondary | ICD-10-CM | POA: Diagnosis not present

## 2022-10-16 DIAGNOSIS — Z7984 Long term (current) use of oral hypoglycemic drugs: Secondary | ICD-10-CM | POA: Diagnosis not present

## 2022-10-16 DIAGNOSIS — Z7901 Long term (current) use of anticoagulants: Secondary | ICD-10-CM | POA: Diagnosis not present

## 2022-10-16 DIAGNOSIS — R413 Other amnesia: Secondary | ICD-10-CM | POA: Diagnosis not present

## 2022-10-16 DIAGNOSIS — J4520 Mild intermittent asthma, uncomplicated: Secondary | ICD-10-CM | POA: Diagnosis not present

## 2022-10-16 DIAGNOSIS — Z87891 Personal history of nicotine dependence: Secondary | ICD-10-CM | POA: Diagnosis not present

## 2022-10-16 DIAGNOSIS — E1142 Type 2 diabetes mellitus with diabetic polyneuropathy: Secondary | ICD-10-CM | POA: Diagnosis not present

## 2022-10-16 DIAGNOSIS — E1151 Type 2 diabetes mellitus with diabetic peripheral angiopathy without gangrene: Secondary | ICD-10-CM | POA: Diagnosis not present

## 2022-10-16 DIAGNOSIS — I2510 Atherosclerotic heart disease of native coronary artery without angina pectoris: Secondary | ICD-10-CM | POA: Diagnosis not present

## 2022-10-16 DIAGNOSIS — I10 Essential (primary) hypertension: Secondary | ICD-10-CM | POA: Diagnosis not present

## 2022-10-16 DIAGNOSIS — E559 Vitamin D deficiency, unspecified: Secondary | ICD-10-CM | POA: Diagnosis not present

## 2022-10-16 DIAGNOSIS — K760 Fatty (change of) liver, not elsewhere classified: Secondary | ICD-10-CM | POA: Diagnosis not present

## 2022-10-16 DIAGNOSIS — L89152 Pressure ulcer of sacral region, stage 2: Secondary | ICD-10-CM | POA: Diagnosis not present

## 2022-10-16 DIAGNOSIS — M159 Polyosteoarthritis, unspecified: Secondary | ICD-10-CM | POA: Diagnosis not present

## 2022-10-16 DIAGNOSIS — I480 Paroxysmal atrial fibrillation: Secondary | ICD-10-CM | POA: Diagnosis not present

## 2022-10-16 DIAGNOSIS — E039 Hypothyroidism, unspecified: Secondary | ICD-10-CM | POA: Diagnosis not present

## 2022-10-16 DIAGNOSIS — E782 Mixed hyperlipidemia: Secondary | ICD-10-CM | POA: Diagnosis not present

## 2022-10-16 DIAGNOSIS — R569 Unspecified convulsions: Secondary | ICD-10-CM | POA: Diagnosis not present

## 2022-10-16 DIAGNOSIS — Z7902 Long term (current) use of antithrombotics/antiplatelets: Secondary | ICD-10-CM | POA: Diagnosis not present

## 2022-10-16 DIAGNOSIS — I252 Old myocardial infarction: Secondary | ICD-10-CM | POA: Diagnosis not present

## 2022-10-16 DIAGNOSIS — Z87440 Personal history of urinary (tract) infections: Secondary | ICD-10-CM | POA: Diagnosis not present

## 2022-10-17 DIAGNOSIS — E1142 Type 2 diabetes mellitus with diabetic polyneuropathy: Secondary | ICD-10-CM | POA: Diagnosis not present

## 2022-10-17 DIAGNOSIS — K760 Fatty (change of) liver, not elsewhere classified: Secondary | ICD-10-CM | POA: Diagnosis not present

## 2022-10-17 DIAGNOSIS — M159 Polyosteoarthritis, unspecified: Secondary | ICD-10-CM | POA: Diagnosis not present

## 2022-10-17 DIAGNOSIS — L853 Xerosis cutis: Secondary | ICD-10-CM | POA: Diagnosis not present

## 2022-10-17 DIAGNOSIS — Z87440 Personal history of urinary (tract) infections: Secondary | ICD-10-CM | POA: Diagnosis not present

## 2022-10-17 DIAGNOSIS — I252 Old myocardial infarction: Secondary | ICD-10-CM | POA: Diagnosis not present

## 2022-10-17 DIAGNOSIS — R569 Unspecified convulsions: Secondary | ICD-10-CM | POA: Diagnosis not present

## 2022-10-17 DIAGNOSIS — Z7902 Long term (current) use of antithrombotics/antiplatelets: Secondary | ICD-10-CM | POA: Diagnosis not present

## 2022-10-17 DIAGNOSIS — E559 Vitamin D deficiency, unspecified: Secondary | ICD-10-CM | POA: Diagnosis not present

## 2022-10-17 DIAGNOSIS — R413 Other amnesia: Secondary | ICD-10-CM | POA: Diagnosis not present

## 2022-10-17 DIAGNOSIS — Z87891 Personal history of nicotine dependence: Secondary | ICD-10-CM | POA: Diagnosis not present

## 2022-10-17 DIAGNOSIS — E538 Deficiency of other specified B group vitamins: Secondary | ICD-10-CM | POA: Diagnosis not present

## 2022-10-17 DIAGNOSIS — I10 Essential (primary) hypertension: Secondary | ICD-10-CM | POA: Diagnosis not present

## 2022-10-17 DIAGNOSIS — J4520 Mild intermittent asthma, uncomplicated: Secondary | ICD-10-CM | POA: Diagnosis not present

## 2022-10-17 DIAGNOSIS — E1151 Type 2 diabetes mellitus with diabetic peripheral angiopathy without gangrene: Secondary | ICD-10-CM | POA: Diagnosis not present

## 2022-10-17 DIAGNOSIS — I2510 Atherosclerotic heart disease of native coronary artery without angina pectoris: Secondary | ICD-10-CM | POA: Diagnosis not present

## 2022-10-17 DIAGNOSIS — Z7901 Long term (current) use of anticoagulants: Secondary | ICD-10-CM | POA: Diagnosis not present

## 2022-10-17 DIAGNOSIS — I480 Paroxysmal atrial fibrillation: Secondary | ICD-10-CM | POA: Diagnosis not present

## 2022-10-17 DIAGNOSIS — E039 Hypothyroidism, unspecified: Secondary | ICD-10-CM | POA: Diagnosis not present

## 2022-10-17 DIAGNOSIS — Z7984 Long term (current) use of oral hypoglycemic drugs: Secondary | ICD-10-CM | POA: Diagnosis not present

## 2022-10-17 DIAGNOSIS — J309 Allergic rhinitis, unspecified: Secondary | ICD-10-CM | POA: Diagnosis not present

## 2022-10-17 DIAGNOSIS — E782 Mixed hyperlipidemia: Secondary | ICD-10-CM | POA: Diagnosis not present

## 2022-10-18 DIAGNOSIS — Z7984 Long term (current) use of oral hypoglycemic drugs: Secondary | ICD-10-CM | POA: Diagnosis not present

## 2022-10-18 DIAGNOSIS — Z955 Presence of coronary angioplasty implant and graft: Secondary | ICD-10-CM | POA: Diagnosis not present

## 2022-10-18 DIAGNOSIS — Z8673 Personal history of transient ischemic attack (TIA), and cerebral infarction without residual deficits: Secondary | ICD-10-CM | POA: Diagnosis not present

## 2022-10-18 DIAGNOSIS — Z87891 Personal history of nicotine dependence: Secondary | ICD-10-CM | POA: Diagnosis not present

## 2022-10-18 DIAGNOSIS — Z87440 Personal history of urinary (tract) infections: Secondary | ICD-10-CM | POA: Diagnosis not present

## 2022-10-19 DIAGNOSIS — M6281 Muscle weakness (generalized): Secondary | ICD-10-CM | POA: Diagnosis not present

## 2022-10-24 DIAGNOSIS — R413 Other amnesia: Secondary | ICD-10-CM | POA: Diagnosis not present

## 2022-10-24 DIAGNOSIS — E039 Hypothyroidism, unspecified: Secondary | ICD-10-CM | POA: Diagnosis not present

## 2022-10-24 DIAGNOSIS — L853 Xerosis cutis: Secondary | ICD-10-CM | POA: Diagnosis not present

## 2022-10-24 DIAGNOSIS — S91302A Unspecified open wound, left foot, initial encounter: Secondary | ICD-10-CM | POA: Diagnosis not present

## 2022-10-24 DIAGNOSIS — Z87891 Personal history of nicotine dependence: Secondary | ICD-10-CM | POA: Diagnosis not present

## 2022-10-24 DIAGNOSIS — L89152 Pressure ulcer of sacral region, stage 2: Secondary | ICD-10-CM | POA: Diagnosis not present

## 2022-10-24 DIAGNOSIS — I252 Old myocardial infarction: Secondary | ICD-10-CM | POA: Diagnosis not present

## 2022-10-24 DIAGNOSIS — I480 Paroxysmal atrial fibrillation: Secondary | ICD-10-CM | POA: Diagnosis not present

## 2022-10-24 DIAGNOSIS — I10 Essential (primary) hypertension: Secondary | ICD-10-CM | POA: Diagnosis not present

## 2022-10-24 DIAGNOSIS — K760 Fatty (change of) liver, not elsewhere classified: Secondary | ICD-10-CM | POA: Diagnosis not present

## 2022-10-24 DIAGNOSIS — E559 Vitamin D deficiency, unspecified: Secondary | ICD-10-CM | POA: Diagnosis not present

## 2022-10-24 DIAGNOSIS — R569 Unspecified convulsions: Secondary | ICD-10-CM | POA: Diagnosis not present

## 2022-10-24 DIAGNOSIS — Z7902 Long term (current) use of antithrombotics/antiplatelets: Secondary | ICD-10-CM | POA: Diagnosis not present

## 2022-10-24 DIAGNOSIS — Z7984 Long term (current) use of oral hypoglycemic drugs: Secondary | ICD-10-CM | POA: Diagnosis not present

## 2022-10-24 DIAGNOSIS — Z87440 Personal history of urinary (tract) infections: Secondary | ICD-10-CM | POA: Diagnosis not present

## 2022-10-24 DIAGNOSIS — J4520 Mild intermittent asthma, uncomplicated: Secondary | ICD-10-CM | POA: Diagnosis not present

## 2022-10-24 DIAGNOSIS — Z7901 Long term (current) use of anticoagulants: Secondary | ICD-10-CM | POA: Diagnosis not present

## 2022-10-24 DIAGNOSIS — M159 Polyosteoarthritis, unspecified: Secondary | ICD-10-CM | POA: Diagnosis not present

## 2022-10-24 DIAGNOSIS — E1151 Type 2 diabetes mellitus with diabetic peripheral angiopathy without gangrene: Secondary | ICD-10-CM | POA: Diagnosis not present

## 2022-10-24 DIAGNOSIS — E782 Mixed hyperlipidemia: Secondary | ICD-10-CM | POA: Diagnosis not present

## 2022-10-24 DIAGNOSIS — E1142 Type 2 diabetes mellitus with diabetic polyneuropathy: Secondary | ICD-10-CM | POA: Diagnosis not present

## 2022-10-24 DIAGNOSIS — I2510 Atherosclerotic heart disease of native coronary artery without angina pectoris: Secondary | ICD-10-CM | POA: Diagnosis not present

## 2022-10-24 DIAGNOSIS — J309 Allergic rhinitis, unspecified: Secondary | ICD-10-CM | POA: Diagnosis not present

## 2022-10-24 DIAGNOSIS — E538 Deficiency of other specified B group vitamins: Secondary | ICD-10-CM | POA: Diagnosis not present

## 2022-10-30 DIAGNOSIS — Z7901 Long term (current) use of anticoagulants: Secondary | ICD-10-CM | POA: Diagnosis not present

## 2022-10-30 DIAGNOSIS — L853 Xerosis cutis: Secondary | ICD-10-CM | POA: Diagnosis not present

## 2022-10-30 DIAGNOSIS — Z7902 Long term (current) use of antithrombotics/antiplatelets: Secondary | ICD-10-CM | POA: Diagnosis not present

## 2022-10-30 DIAGNOSIS — L89152 Pressure ulcer of sacral region, stage 2: Secondary | ICD-10-CM | POA: Diagnosis not present

## 2022-10-30 DIAGNOSIS — Z87440 Personal history of urinary (tract) infections: Secondary | ICD-10-CM | POA: Diagnosis not present

## 2022-10-30 DIAGNOSIS — R569 Unspecified convulsions: Secondary | ICD-10-CM | POA: Diagnosis not present

## 2022-10-30 DIAGNOSIS — M159 Polyosteoarthritis, unspecified: Secondary | ICD-10-CM | POA: Diagnosis not present

## 2022-10-30 DIAGNOSIS — E1151 Type 2 diabetes mellitus with diabetic peripheral angiopathy without gangrene: Secondary | ICD-10-CM | POA: Diagnosis not present

## 2022-10-30 DIAGNOSIS — E039 Hypothyroidism, unspecified: Secondary | ICD-10-CM | POA: Diagnosis not present

## 2022-10-30 DIAGNOSIS — E538 Deficiency of other specified B group vitamins: Secondary | ICD-10-CM | POA: Diagnosis not present

## 2022-10-30 DIAGNOSIS — I480 Paroxysmal atrial fibrillation: Secondary | ICD-10-CM | POA: Diagnosis not present

## 2022-10-30 DIAGNOSIS — E559 Vitamin D deficiency, unspecified: Secondary | ICD-10-CM | POA: Diagnosis not present

## 2022-10-30 DIAGNOSIS — E782 Mixed hyperlipidemia: Secondary | ICD-10-CM | POA: Diagnosis not present

## 2022-10-30 DIAGNOSIS — I2510 Atherosclerotic heart disease of native coronary artery without angina pectoris: Secondary | ICD-10-CM | POA: Diagnosis not present

## 2022-10-30 DIAGNOSIS — R413 Other amnesia: Secondary | ICD-10-CM | POA: Diagnosis not present

## 2022-10-30 DIAGNOSIS — E1142 Type 2 diabetes mellitus with diabetic polyneuropathy: Secondary | ICD-10-CM | POA: Diagnosis not present

## 2022-10-30 DIAGNOSIS — I252 Old myocardial infarction: Secondary | ICD-10-CM | POA: Diagnosis not present

## 2022-10-30 DIAGNOSIS — I10 Essential (primary) hypertension: Secondary | ICD-10-CM | POA: Diagnosis not present

## 2022-10-30 DIAGNOSIS — J4520 Mild intermittent asthma, uncomplicated: Secondary | ICD-10-CM | POA: Diagnosis not present

## 2022-10-30 DIAGNOSIS — K760 Fatty (change of) liver, not elsewhere classified: Secondary | ICD-10-CM | POA: Diagnosis not present

## 2022-10-30 DIAGNOSIS — J309 Allergic rhinitis, unspecified: Secondary | ICD-10-CM | POA: Diagnosis not present

## 2022-10-30 DIAGNOSIS — Z7984 Long term (current) use of oral hypoglycemic drugs: Secondary | ICD-10-CM | POA: Diagnosis not present

## 2022-11-06 DIAGNOSIS — M159 Polyosteoarthritis, unspecified: Secondary | ICD-10-CM | POA: Diagnosis not present

## 2022-11-06 DIAGNOSIS — J309 Allergic rhinitis, unspecified: Secondary | ICD-10-CM | POA: Diagnosis not present

## 2022-11-06 DIAGNOSIS — K760 Fatty (change of) liver, not elsewhere classified: Secondary | ICD-10-CM | POA: Diagnosis not present

## 2022-11-06 DIAGNOSIS — I2510 Atherosclerotic heart disease of native coronary artery without angina pectoris: Secondary | ICD-10-CM | POA: Diagnosis not present

## 2022-11-06 DIAGNOSIS — J4520 Mild intermittent asthma, uncomplicated: Secondary | ICD-10-CM | POA: Diagnosis not present

## 2022-11-06 DIAGNOSIS — L853 Xerosis cutis: Secondary | ICD-10-CM | POA: Diagnosis not present

## 2022-11-06 DIAGNOSIS — E1142 Type 2 diabetes mellitus with diabetic polyneuropathy: Secondary | ICD-10-CM | POA: Diagnosis not present

## 2022-11-06 DIAGNOSIS — E1151 Type 2 diabetes mellitus with diabetic peripheral angiopathy without gangrene: Secondary | ICD-10-CM | POA: Diagnosis not present

## 2022-11-06 DIAGNOSIS — Z87440 Personal history of urinary (tract) infections: Secondary | ICD-10-CM | POA: Diagnosis not present

## 2022-11-06 DIAGNOSIS — R569 Unspecified convulsions: Secondary | ICD-10-CM | POA: Diagnosis not present

## 2022-11-06 DIAGNOSIS — Z7902 Long term (current) use of antithrombotics/antiplatelets: Secondary | ICD-10-CM | POA: Diagnosis not present

## 2022-11-06 DIAGNOSIS — E039 Hypothyroidism, unspecified: Secondary | ICD-10-CM | POA: Diagnosis not present

## 2022-11-06 DIAGNOSIS — E559 Vitamin D deficiency, unspecified: Secondary | ICD-10-CM | POA: Diagnosis not present

## 2022-11-06 DIAGNOSIS — E782 Mixed hyperlipidemia: Secondary | ICD-10-CM | POA: Diagnosis not present

## 2022-11-06 DIAGNOSIS — L89152 Pressure ulcer of sacral region, stage 2: Secondary | ICD-10-CM | POA: Diagnosis not present

## 2022-11-06 DIAGNOSIS — R413 Other amnesia: Secondary | ICD-10-CM | POA: Diagnosis not present

## 2022-11-06 DIAGNOSIS — Z7984 Long term (current) use of oral hypoglycemic drugs: Secondary | ICD-10-CM | POA: Diagnosis not present

## 2022-11-06 DIAGNOSIS — E538 Deficiency of other specified B group vitamins: Secondary | ICD-10-CM | POA: Diagnosis not present

## 2022-11-06 DIAGNOSIS — I480 Paroxysmal atrial fibrillation: Secondary | ICD-10-CM | POA: Diagnosis not present

## 2022-11-06 DIAGNOSIS — Z7901 Long term (current) use of anticoagulants: Secondary | ICD-10-CM | POA: Diagnosis not present

## 2022-11-06 DIAGNOSIS — I252 Old myocardial infarction: Secondary | ICD-10-CM | POA: Diagnosis not present

## 2022-11-06 DIAGNOSIS — I10 Essential (primary) hypertension: Secondary | ICD-10-CM | POA: Diagnosis not present

## 2022-11-13 DIAGNOSIS — I252 Old myocardial infarction: Secondary | ICD-10-CM | POA: Diagnosis not present

## 2022-11-13 DIAGNOSIS — I480 Paroxysmal atrial fibrillation: Secondary | ICD-10-CM | POA: Diagnosis not present

## 2022-11-13 DIAGNOSIS — E1151 Type 2 diabetes mellitus with diabetic peripheral angiopathy without gangrene: Secondary | ICD-10-CM | POA: Diagnosis not present

## 2022-11-13 DIAGNOSIS — R569 Unspecified convulsions: Secondary | ICD-10-CM | POA: Diagnosis not present

## 2022-11-13 DIAGNOSIS — E782 Mixed hyperlipidemia: Secondary | ICD-10-CM | POA: Diagnosis not present

## 2022-11-13 DIAGNOSIS — L89152 Pressure ulcer of sacral region, stage 2: Secondary | ICD-10-CM | POA: Diagnosis not present

## 2022-11-13 DIAGNOSIS — J4520 Mild intermittent asthma, uncomplicated: Secondary | ICD-10-CM | POA: Diagnosis not present

## 2022-11-13 DIAGNOSIS — E039 Hypothyroidism, unspecified: Secondary | ICD-10-CM | POA: Diagnosis not present

## 2022-11-13 DIAGNOSIS — J309 Allergic rhinitis, unspecified: Secondary | ICD-10-CM | POA: Diagnosis not present

## 2022-11-13 DIAGNOSIS — Z87440 Personal history of urinary (tract) infections: Secondary | ICD-10-CM | POA: Diagnosis not present

## 2022-11-13 DIAGNOSIS — E1142 Type 2 diabetes mellitus with diabetic polyneuropathy: Secondary | ICD-10-CM | POA: Diagnosis not present

## 2022-11-13 DIAGNOSIS — E538 Deficiency of other specified B group vitamins: Secondary | ICD-10-CM | POA: Diagnosis not present

## 2022-11-13 DIAGNOSIS — Z7901 Long term (current) use of anticoagulants: Secondary | ICD-10-CM | POA: Diagnosis not present

## 2022-11-13 DIAGNOSIS — E559 Vitamin D deficiency, unspecified: Secondary | ICD-10-CM | POA: Diagnosis not present

## 2022-11-13 DIAGNOSIS — I2510 Atherosclerotic heart disease of native coronary artery without angina pectoris: Secondary | ICD-10-CM | POA: Diagnosis not present

## 2022-11-13 DIAGNOSIS — K760 Fatty (change of) liver, not elsewhere classified: Secondary | ICD-10-CM | POA: Diagnosis not present

## 2022-11-13 DIAGNOSIS — L853 Xerosis cutis: Secondary | ICD-10-CM | POA: Diagnosis not present

## 2022-11-13 DIAGNOSIS — M159 Polyosteoarthritis, unspecified: Secondary | ICD-10-CM | POA: Diagnosis not present

## 2022-11-13 DIAGNOSIS — I10 Essential (primary) hypertension: Secondary | ICD-10-CM | POA: Diagnosis not present

## 2022-11-13 DIAGNOSIS — R413 Other amnesia: Secondary | ICD-10-CM | POA: Diagnosis not present

## 2022-11-13 DIAGNOSIS — Z7984 Long term (current) use of oral hypoglycemic drugs: Secondary | ICD-10-CM | POA: Diagnosis not present

## 2022-11-13 DIAGNOSIS — Z7902 Long term (current) use of antithrombotics/antiplatelets: Secondary | ICD-10-CM | POA: Diagnosis not present

## 2022-11-18 DIAGNOSIS — M6281 Muscle weakness (generalized): Secondary | ICD-10-CM | POA: Diagnosis not present

## 2022-11-22 DIAGNOSIS — E782 Mixed hyperlipidemia: Secondary | ICD-10-CM | POA: Diagnosis not present

## 2022-11-22 DIAGNOSIS — E538 Deficiency of other specified B group vitamins: Secondary | ICD-10-CM | POA: Diagnosis not present

## 2022-11-22 DIAGNOSIS — E1151 Type 2 diabetes mellitus with diabetic peripheral angiopathy without gangrene: Secondary | ICD-10-CM | POA: Diagnosis not present

## 2022-11-22 DIAGNOSIS — J4520 Mild intermittent asthma, uncomplicated: Secondary | ICD-10-CM | POA: Diagnosis not present

## 2022-11-22 DIAGNOSIS — I252 Old myocardial infarction: Secondary | ICD-10-CM | POA: Diagnosis not present

## 2022-11-22 DIAGNOSIS — Z87440 Personal history of urinary (tract) infections: Secondary | ICD-10-CM | POA: Diagnosis not present

## 2022-11-22 DIAGNOSIS — E559 Vitamin D deficiency, unspecified: Secondary | ICD-10-CM | POA: Diagnosis not present

## 2022-11-22 DIAGNOSIS — I10 Essential (primary) hypertension: Secondary | ICD-10-CM | POA: Diagnosis not present

## 2022-11-22 DIAGNOSIS — Z7984 Long term (current) use of oral hypoglycemic drugs: Secondary | ICD-10-CM | POA: Diagnosis not present

## 2022-11-22 DIAGNOSIS — K760 Fatty (change of) liver, not elsewhere classified: Secondary | ICD-10-CM | POA: Diagnosis not present

## 2022-11-22 DIAGNOSIS — R569 Unspecified convulsions: Secondary | ICD-10-CM | POA: Diagnosis not present

## 2022-11-22 DIAGNOSIS — M159 Polyosteoarthritis, unspecified: Secondary | ICD-10-CM | POA: Diagnosis not present

## 2022-11-22 DIAGNOSIS — E1142 Type 2 diabetes mellitus with diabetic polyneuropathy: Secondary | ICD-10-CM | POA: Diagnosis not present

## 2022-11-22 DIAGNOSIS — E039 Hypothyroidism, unspecified: Secondary | ICD-10-CM | POA: Diagnosis not present

## 2022-11-22 DIAGNOSIS — L89152 Pressure ulcer of sacral region, stage 2: Secondary | ICD-10-CM | POA: Diagnosis not present

## 2022-11-22 DIAGNOSIS — I2510 Atherosclerotic heart disease of native coronary artery without angina pectoris: Secondary | ICD-10-CM | POA: Diagnosis not present

## 2022-11-22 DIAGNOSIS — Z7902 Long term (current) use of antithrombotics/antiplatelets: Secondary | ICD-10-CM | POA: Diagnosis not present

## 2022-11-22 DIAGNOSIS — I480 Paroxysmal atrial fibrillation: Secondary | ICD-10-CM | POA: Diagnosis not present

## 2022-11-22 DIAGNOSIS — Z7901 Long term (current) use of anticoagulants: Secondary | ICD-10-CM | POA: Diagnosis not present

## 2022-11-22 DIAGNOSIS — R413 Other amnesia: Secondary | ICD-10-CM | POA: Diagnosis not present

## 2022-11-22 DIAGNOSIS — L853 Xerosis cutis: Secondary | ICD-10-CM | POA: Diagnosis not present

## 2022-11-22 DIAGNOSIS — J309 Allergic rhinitis, unspecified: Secondary | ICD-10-CM | POA: Diagnosis not present

## 2022-11-28 DIAGNOSIS — R413 Other amnesia: Secondary | ICD-10-CM | POA: Diagnosis not present

## 2022-11-28 DIAGNOSIS — L89152 Pressure ulcer of sacral region, stage 2: Secondary | ICD-10-CM | POA: Diagnosis not present

## 2022-11-28 DIAGNOSIS — I480 Paroxysmal atrial fibrillation: Secondary | ICD-10-CM | POA: Diagnosis not present

## 2022-11-28 DIAGNOSIS — I252 Old myocardial infarction: Secondary | ICD-10-CM | POA: Diagnosis not present

## 2022-11-28 DIAGNOSIS — E538 Deficiency of other specified B group vitamins: Secondary | ICD-10-CM | POA: Diagnosis not present

## 2022-11-28 DIAGNOSIS — J4520 Mild intermittent asthma, uncomplicated: Secondary | ICD-10-CM | POA: Diagnosis not present

## 2022-11-28 DIAGNOSIS — M159 Polyosteoarthritis, unspecified: Secondary | ICD-10-CM | POA: Diagnosis not present

## 2022-11-28 DIAGNOSIS — E782 Mixed hyperlipidemia: Secondary | ICD-10-CM | POA: Diagnosis not present

## 2022-11-28 DIAGNOSIS — Z7901 Long term (current) use of anticoagulants: Secondary | ICD-10-CM | POA: Diagnosis not present

## 2022-11-28 DIAGNOSIS — E039 Hypothyroidism, unspecified: Secondary | ICD-10-CM | POA: Diagnosis not present

## 2022-11-28 DIAGNOSIS — R569 Unspecified convulsions: Secondary | ICD-10-CM | POA: Diagnosis not present

## 2022-11-28 DIAGNOSIS — I10 Essential (primary) hypertension: Secondary | ICD-10-CM | POA: Diagnosis not present

## 2022-11-28 DIAGNOSIS — Z7902 Long term (current) use of antithrombotics/antiplatelets: Secondary | ICD-10-CM | POA: Diagnosis not present

## 2022-11-28 DIAGNOSIS — Z87440 Personal history of urinary (tract) infections: Secondary | ICD-10-CM | POA: Diagnosis not present

## 2022-11-28 DIAGNOSIS — E559 Vitamin D deficiency, unspecified: Secondary | ICD-10-CM | POA: Diagnosis not present

## 2022-11-28 DIAGNOSIS — L853 Xerosis cutis: Secondary | ICD-10-CM | POA: Diagnosis not present

## 2022-11-28 DIAGNOSIS — E1142 Type 2 diabetes mellitus with diabetic polyneuropathy: Secondary | ICD-10-CM | POA: Diagnosis not present

## 2022-11-28 DIAGNOSIS — Z7984 Long term (current) use of oral hypoglycemic drugs: Secondary | ICD-10-CM | POA: Diagnosis not present

## 2022-11-28 DIAGNOSIS — E1151 Type 2 diabetes mellitus with diabetic peripheral angiopathy without gangrene: Secondary | ICD-10-CM | POA: Diagnosis not present

## 2022-11-28 DIAGNOSIS — J309 Allergic rhinitis, unspecified: Secondary | ICD-10-CM | POA: Diagnosis not present

## 2022-11-28 DIAGNOSIS — K760 Fatty (change of) liver, not elsewhere classified: Secondary | ICD-10-CM | POA: Diagnosis not present

## 2022-11-28 DIAGNOSIS — I2510 Atherosclerotic heart disease of native coronary artery without angina pectoris: Secondary | ICD-10-CM | POA: Diagnosis not present

## 2022-12-02 DIAGNOSIS — M159 Polyosteoarthritis, unspecified: Secondary | ICD-10-CM | POA: Diagnosis not present

## 2022-12-02 DIAGNOSIS — R6889 Other general symptoms and signs: Secondary | ICD-10-CM | POA: Diagnosis not present

## 2022-12-02 DIAGNOSIS — R296 Repeated falls: Secondary | ICD-10-CM | POA: Diagnosis not present

## 2022-12-02 DIAGNOSIS — R413 Other amnesia: Secondary | ICD-10-CM | POA: Diagnosis not present

## 2022-12-02 DIAGNOSIS — R531 Weakness: Secondary | ICD-10-CM | POA: Diagnosis not present

## 2022-12-02 DIAGNOSIS — R600 Localized edema: Secondary | ICD-10-CM | POA: Diagnosis not present

## 2022-12-02 DIAGNOSIS — E1140 Type 2 diabetes mellitus with diabetic neuropathy, unspecified: Secondary | ICD-10-CM | POA: Diagnosis not present

## 2022-12-02 DIAGNOSIS — Z7409 Other reduced mobility: Secondary | ICD-10-CM | POA: Diagnosis not present

## 2022-12-02 DIAGNOSIS — E782 Mixed hyperlipidemia: Secondary | ICD-10-CM | POA: Diagnosis not present

## 2022-12-07 DIAGNOSIS — E782 Mixed hyperlipidemia: Secondary | ICD-10-CM | POA: Diagnosis not present

## 2022-12-07 DIAGNOSIS — L853 Xerosis cutis: Secondary | ICD-10-CM | POA: Diagnosis not present

## 2022-12-07 DIAGNOSIS — Z7902 Long term (current) use of antithrombotics/antiplatelets: Secondary | ICD-10-CM | POA: Diagnosis not present

## 2022-12-07 DIAGNOSIS — E538 Deficiency of other specified B group vitamins: Secondary | ICD-10-CM | POA: Diagnosis not present

## 2022-12-07 DIAGNOSIS — E1151 Type 2 diabetes mellitus with diabetic peripheral angiopathy without gangrene: Secondary | ICD-10-CM | POA: Diagnosis not present

## 2022-12-07 DIAGNOSIS — J309 Allergic rhinitis, unspecified: Secondary | ICD-10-CM | POA: Diagnosis not present

## 2022-12-07 DIAGNOSIS — R413 Other amnesia: Secondary | ICD-10-CM | POA: Diagnosis not present

## 2022-12-07 DIAGNOSIS — E039 Hypothyroidism, unspecified: Secondary | ICD-10-CM | POA: Diagnosis not present

## 2022-12-07 DIAGNOSIS — J4520 Mild intermittent asthma, uncomplicated: Secondary | ICD-10-CM | POA: Diagnosis not present

## 2022-12-07 DIAGNOSIS — E1142 Type 2 diabetes mellitus with diabetic polyneuropathy: Secondary | ICD-10-CM | POA: Diagnosis not present

## 2022-12-07 DIAGNOSIS — Z7901 Long term (current) use of anticoagulants: Secondary | ICD-10-CM | POA: Diagnosis not present

## 2022-12-07 DIAGNOSIS — R569 Unspecified convulsions: Secondary | ICD-10-CM | POA: Diagnosis not present

## 2022-12-07 DIAGNOSIS — I252 Old myocardial infarction: Secondary | ICD-10-CM | POA: Diagnosis not present

## 2022-12-07 DIAGNOSIS — M159 Polyosteoarthritis, unspecified: Secondary | ICD-10-CM | POA: Diagnosis not present

## 2022-12-07 DIAGNOSIS — I10 Essential (primary) hypertension: Secondary | ICD-10-CM | POA: Diagnosis not present

## 2022-12-07 DIAGNOSIS — L89152 Pressure ulcer of sacral region, stage 2: Secondary | ICD-10-CM | POA: Diagnosis not present

## 2022-12-07 DIAGNOSIS — K760 Fatty (change of) liver, not elsewhere classified: Secondary | ICD-10-CM | POA: Diagnosis not present

## 2022-12-07 DIAGNOSIS — Z7984 Long term (current) use of oral hypoglycemic drugs: Secondary | ICD-10-CM | POA: Diagnosis not present

## 2022-12-07 DIAGNOSIS — I2510 Atherosclerotic heart disease of native coronary artery without angina pectoris: Secondary | ICD-10-CM | POA: Diagnosis not present

## 2022-12-07 DIAGNOSIS — I480 Paroxysmal atrial fibrillation: Secondary | ICD-10-CM | POA: Diagnosis not present

## 2022-12-07 DIAGNOSIS — Z87440 Personal history of urinary (tract) infections: Secondary | ICD-10-CM | POA: Diagnosis not present

## 2022-12-07 DIAGNOSIS — E559 Vitamin D deficiency, unspecified: Secondary | ICD-10-CM | POA: Diagnosis not present

## 2022-12-13 DIAGNOSIS — E1151 Type 2 diabetes mellitus with diabetic peripheral angiopathy without gangrene: Secondary | ICD-10-CM | POA: Diagnosis not present

## 2022-12-13 DIAGNOSIS — E1142 Type 2 diabetes mellitus with diabetic polyneuropathy: Secondary | ICD-10-CM | POA: Diagnosis not present

## 2022-12-13 DIAGNOSIS — R413 Other amnesia: Secondary | ICD-10-CM | POA: Diagnosis not present

## 2022-12-13 DIAGNOSIS — J4520 Mild intermittent asthma, uncomplicated: Secondary | ICD-10-CM | POA: Diagnosis not present

## 2022-12-13 DIAGNOSIS — I2510 Atherosclerotic heart disease of native coronary artery without angina pectoris: Secondary | ICD-10-CM | POA: Diagnosis not present

## 2022-12-13 DIAGNOSIS — J309 Allergic rhinitis, unspecified: Secondary | ICD-10-CM | POA: Diagnosis not present

## 2022-12-13 DIAGNOSIS — R569 Unspecified convulsions: Secondary | ICD-10-CM | POA: Diagnosis not present

## 2022-12-13 DIAGNOSIS — E559 Vitamin D deficiency, unspecified: Secondary | ICD-10-CM | POA: Diagnosis not present

## 2022-12-13 DIAGNOSIS — E039 Hypothyroidism, unspecified: Secondary | ICD-10-CM | POA: Diagnosis not present

## 2022-12-13 DIAGNOSIS — L853 Xerosis cutis: Secondary | ICD-10-CM | POA: Diagnosis not present

## 2022-12-13 DIAGNOSIS — Z87440 Personal history of urinary (tract) infections: Secondary | ICD-10-CM | POA: Diagnosis not present

## 2022-12-13 DIAGNOSIS — Z7901 Long term (current) use of anticoagulants: Secondary | ICD-10-CM | POA: Diagnosis not present

## 2022-12-13 DIAGNOSIS — E782 Mixed hyperlipidemia: Secondary | ICD-10-CM | POA: Diagnosis not present

## 2022-12-13 DIAGNOSIS — Z7902 Long term (current) use of antithrombotics/antiplatelets: Secondary | ICD-10-CM | POA: Diagnosis not present

## 2022-12-13 DIAGNOSIS — I480 Paroxysmal atrial fibrillation: Secondary | ICD-10-CM | POA: Diagnosis not present

## 2022-12-13 DIAGNOSIS — M159 Polyosteoarthritis, unspecified: Secondary | ICD-10-CM | POA: Diagnosis not present

## 2022-12-13 DIAGNOSIS — L89152 Pressure ulcer of sacral region, stage 2: Secondary | ICD-10-CM | POA: Diagnosis not present

## 2022-12-13 DIAGNOSIS — I10 Essential (primary) hypertension: Secondary | ICD-10-CM | POA: Diagnosis not present

## 2022-12-13 DIAGNOSIS — Z7984 Long term (current) use of oral hypoglycemic drugs: Secondary | ICD-10-CM | POA: Diagnosis not present

## 2022-12-13 DIAGNOSIS — I252 Old myocardial infarction: Secondary | ICD-10-CM | POA: Diagnosis not present

## 2022-12-13 DIAGNOSIS — E538 Deficiency of other specified B group vitamins: Secondary | ICD-10-CM | POA: Diagnosis not present

## 2022-12-13 DIAGNOSIS — K760 Fatty (change of) liver, not elsewhere classified: Secondary | ICD-10-CM | POA: Diagnosis not present

## 2022-12-19 DIAGNOSIS — Z0000 Encounter for general adult medical examination without abnormal findings: Secondary | ICD-10-CM | POA: Diagnosis not present

## 2022-12-19 DIAGNOSIS — M6281 Muscle weakness (generalized): Secondary | ICD-10-CM | POA: Diagnosis not present

## 2022-12-19 DIAGNOSIS — Z9181 History of falling: Secondary | ICD-10-CM | POA: Diagnosis not present

## 2022-12-20 DIAGNOSIS — Z7901 Long term (current) use of anticoagulants: Secondary | ICD-10-CM | POA: Diagnosis not present

## 2022-12-20 DIAGNOSIS — M159 Polyosteoarthritis, unspecified: Secondary | ICD-10-CM | POA: Diagnosis not present

## 2022-12-20 DIAGNOSIS — E1151 Type 2 diabetes mellitus with diabetic peripheral angiopathy without gangrene: Secondary | ICD-10-CM | POA: Diagnosis not present

## 2022-12-20 DIAGNOSIS — E039 Hypothyroidism, unspecified: Secondary | ICD-10-CM | POA: Diagnosis not present

## 2022-12-20 DIAGNOSIS — K760 Fatty (change of) liver, not elsewhere classified: Secondary | ICD-10-CM | POA: Diagnosis not present

## 2022-12-20 DIAGNOSIS — I252 Old myocardial infarction: Secondary | ICD-10-CM | POA: Diagnosis not present

## 2022-12-20 DIAGNOSIS — Z7902 Long term (current) use of antithrombotics/antiplatelets: Secondary | ICD-10-CM | POA: Diagnosis not present

## 2022-12-20 DIAGNOSIS — L89152 Pressure ulcer of sacral region, stage 2: Secondary | ICD-10-CM | POA: Diagnosis not present

## 2022-12-20 DIAGNOSIS — J309 Allergic rhinitis, unspecified: Secondary | ICD-10-CM | POA: Diagnosis not present

## 2022-12-20 DIAGNOSIS — R569 Unspecified convulsions: Secondary | ICD-10-CM | POA: Diagnosis not present

## 2022-12-20 DIAGNOSIS — E559 Vitamin D deficiency, unspecified: Secondary | ICD-10-CM | POA: Diagnosis not present

## 2022-12-20 DIAGNOSIS — Z87440 Personal history of urinary (tract) infections: Secondary | ICD-10-CM | POA: Diagnosis not present

## 2022-12-20 DIAGNOSIS — R413 Other amnesia: Secondary | ICD-10-CM | POA: Diagnosis not present

## 2022-12-20 DIAGNOSIS — E1142 Type 2 diabetes mellitus with diabetic polyneuropathy: Secondary | ICD-10-CM | POA: Diagnosis not present

## 2022-12-20 DIAGNOSIS — L853 Xerosis cutis: Secondary | ICD-10-CM | POA: Diagnosis not present

## 2022-12-20 DIAGNOSIS — Z7984 Long term (current) use of oral hypoglycemic drugs: Secondary | ICD-10-CM | POA: Diagnosis not present

## 2022-12-20 DIAGNOSIS — E538 Deficiency of other specified B group vitamins: Secondary | ICD-10-CM | POA: Diagnosis not present

## 2022-12-20 DIAGNOSIS — I10 Essential (primary) hypertension: Secondary | ICD-10-CM | POA: Diagnosis not present

## 2022-12-20 DIAGNOSIS — I2510 Atherosclerotic heart disease of native coronary artery without angina pectoris: Secondary | ICD-10-CM | POA: Diagnosis not present

## 2022-12-20 DIAGNOSIS — I480 Paroxysmal atrial fibrillation: Secondary | ICD-10-CM | POA: Diagnosis not present

## 2022-12-20 DIAGNOSIS — J4520 Mild intermittent asthma, uncomplicated: Secondary | ICD-10-CM | POA: Diagnosis not present

## 2022-12-20 DIAGNOSIS — E782 Mixed hyperlipidemia: Secondary | ICD-10-CM | POA: Diagnosis not present

## 2022-12-25 DIAGNOSIS — E039 Hypothyroidism, unspecified: Secondary | ICD-10-CM | POA: Diagnosis not present

## 2022-12-25 DIAGNOSIS — Z7901 Long term (current) use of anticoagulants: Secondary | ICD-10-CM | POA: Diagnosis not present

## 2022-12-25 DIAGNOSIS — M159 Polyosteoarthritis, unspecified: Secondary | ICD-10-CM | POA: Diagnosis not present

## 2022-12-25 DIAGNOSIS — L89152 Pressure ulcer of sacral region, stage 2: Secondary | ICD-10-CM | POA: Diagnosis not present

## 2022-12-25 DIAGNOSIS — E538 Deficiency of other specified B group vitamins: Secondary | ICD-10-CM | POA: Diagnosis not present

## 2022-12-25 DIAGNOSIS — I10 Essential (primary) hypertension: Secondary | ICD-10-CM | POA: Diagnosis not present

## 2022-12-25 DIAGNOSIS — E559 Vitamin D deficiency, unspecified: Secondary | ICD-10-CM | POA: Diagnosis not present

## 2022-12-25 DIAGNOSIS — I480 Paroxysmal atrial fibrillation: Secondary | ICD-10-CM | POA: Diagnosis not present

## 2022-12-25 DIAGNOSIS — R569 Unspecified convulsions: Secondary | ICD-10-CM | POA: Diagnosis not present

## 2022-12-25 DIAGNOSIS — I252 Old myocardial infarction: Secondary | ICD-10-CM | POA: Diagnosis not present

## 2022-12-25 DIAGNOSIS — E1151 Type 2 diabetes mellitus with diabetic peripheral angiopathy without gangrene: Secondary | ICD-10-CM | POA: Diagnosis not present

## 2022-12-25 DIAGNOSIS — Z7902 Long term (current) use of antithrombotics/antiplatelets: Secondary | ICD-10-CM | POA: Diagnosis not present

## 2022-12-25 DIAGNOSIS — R413 Other amnesia: Secondary | ICD-10-CM | POA: Diagnosis not present

## 2022-12-25 DIAGNOSIS — I2510 Atherosclerotic heart disease of native coronary artery without angina pectoris: Secondary | ICD-10-CM | POA: Diagnosis not present

## 2022-12-25 DIAGNOSIS — Z87440 Personal history of urinary (tract) infections: Secondary | ICD-10-CM | POA: Diagnosis not present

## 2022-12-25 DIAGNOSIS — J4520 Mild intermittent asthma, uncomplicated: Secondary | ICD-10-CM | POA: Diagnosis not present

## 2022-12-25 DIAGNOSIS — E782 Mixed hyperlipidemia: Secondary | ICD-10-CM | POA: Diagnosis not present

## 2022-12-25 DIAGNOSIS — L853 Xerosis cutis: Secondary | ICD-10-CM | POA: Diagnosis not present

## 2022-12-25 DIAGNOSIS — K760 Fatty (change of) liver, not elsewhere classified: Secondary | ICD-10-CM | POA: Diagnosis not present

## 2022-12-25 DIAGNOSIS — J309 Allergic rhinitis, unspecified: Secondary | ICD-10-CM | POA: Diagnosis not present

## 2022-12-25 DIAGNOSIS — E1142 Type 2 diabetes mellitus with diabetic polyneuropathy: Secondary | ICD-10-CM | POA: Diagnosis not present

## 2022-12-25 DIAGNOSIS — Z7984 Long term (current) use of oral hypoglycemic drugs: Secondary | ICD-10-CM | POA: Diagnosis not present

## 2022-12-30 DIAGNOSIS — R531 Weakness: Secondary | ICD-10-CM | POA: Diagnosis not present

## 2022-12-30 DIAGNOSIS — E782 Mixed hyperlipidemia: Secondary | ICD-10-CM | POA: Diagnosis not present

## 2022-12-30 DIAGNOSIS — Z7409 Other reduced mobility: Secondary | ICD-10-CM | POA: Diagnosis not present

## 2022-12-30 DIAGNOSIS — R413 Other amnesia: Secondary | ICD-10-CM | POA: Diagnosis not present

## 2022-12-30 DIAGNOSIS — I10 Essential (primary) hypertension: Secondary | ICD-10-CM | POA: Diagnosis not present

## 2022-12-30 DIAGNOSIS — R296 Repeated falls: Secondary | ICD-10-CM | POA: Diagnosis not present

## 2022-12-30 DIAGNOSIS — R600 Localized edema: Secondary | ICD-10-CM | POA: Diagnosis not present

## 2022-12-30 DIAGNOSIS — L853 Xerosis cutis: Secondary | ICD-10-CM | POA: Diagnosis not present

## 2022-12-30 DIAGNOSIS — M159 Polyosteoarthritis, unspecified: Secondary | ICD-10-CM | POA: Diagnosis not present

## 2022-12-30 DIAGNOSIS — R6889 Other general symptoms and signs: Secondary | ICD-10-CM | POA: Diagnosis not present

## 2022-12-30 DIAGNOSIS — E1140 Type 2 diabetes mellitus with diabetic neuropathy, unspecified: Secondary | ICD-10-CM | POA: Diagnosis not present

## 2023-01-03 DIAGNOSIS — E039 Hypothyroidism, unspecified: Secondary | ICD-10-CM | POA: Diagnosis not present

## 2023-01-03 DIAGNOSIS — E440 Moderate protein-calorie malnutrition: Secondary | ICD-10-CM | POA: Diagnosis not present

## 2023-01-03 DIAGNOSIS — R569 Unspecified convulsions: Secondary | ICD-10-CM | POA: Diagnosis not present

## 2023-01-03 DIAGNOSIS — Z7902 Long term (current) use of antithrombotics/antiplatelets: Secondary | ICD-10-CM | POA: Diagnosis not present

## 2023-01-03 DIAGNOSIS — Z87891 Personal history of nicotine dependence: Secondary | ICD-10-CM | POA: Diagnosis not present

## 2023-01-03 DIAGNOSIS — L853 Xerosis cutis: Secondary | ICD-10-CM | POA: Diagnosis not present

## 2023-01-03 DIAGNOSIS — I10 Essential (primary) hypertension: Secondary | ICD-10-CM | POA: Diagnosis not present

## 2023-01-03 DIAGNOSIS — E559 Vitamin D deficiency, unspecified: Secondary | ICD-10-CM | POA: Diagnosis not present

## 2023-01-03 DIAGNOSIS — E1142 Type 2 diabetes mellitus with diabetic polyneuropathy: Secondary | ICD-10-CM | POA: Diagnosis not present

## 2023-01-03 DIAGNOSIS — I252 Old myocardial infarction: Secondary | ICD-10-CM | POA: Diagnosis not present

## 2023-01-03 DIAGNOSIS — R413 Other amnesia: Secondary | ICD-10-CM | POA: Diagnosis not present

## 2023-01-03 DIAGNOSIS — I480 Paroxysmal atrial fibrillation: Secondary | ICD-10-CM | POA: Diagnosis not present

## 2023-01-03 DIAGNOSIS — Z87440 Personal history of urinary (tract) infections: Secondary | ICD-10-CM | POA: Diagnosis not present

## 2023-01-03 DIAGNOSIS — E538 Deficiency of other specified B group vitamins: Secondary | ICD-10-CM | POA: Diagnosis not present

## 2023-01-03 DIAGNOSIS — E782 Mixed hyperlipidemia: Secondary | ICD-10-CM | POA: Diagnosis not present

## 2023-01-03 DIAGNOSIS — Z7984 Long term (current) use of oral hypoglycemic drugs: Secondary | ICD-10-CM | POA: Diagnosis not present

## 2023-01-03 DIAGNOSIS — Z8673 Personal history of transient ischemic attack (TIA), and cerebral infarction without residual deficits: Secondary | ICD-10-CM | POA: Diagnosis not present

## 2023-01-03 DIAGNOSIS — M159 Polyosteoarthritis, unspecified: Secondary | ICD-10-CM | POA: Diagnosis not present

## 2023-01-03 DIAGNOSIS — Z7901 Long term (current) use of anticoagulants: Secondary | ICD-10-CM | POA: Diagnosis not present

## 2023-01-03 DIAGNOSIS — I2510 Atherosclerotic heart disease of native coronary artery without angina pectoris: Secondary | ICD-10-CM | POA: Diagnosis not present

## 2023-01-03 DIAGNOSIS — K760 Fatty (change of) liver, not elsewhere classified: Secondary | ICD-10-CM | POA: Diagnosis not present

## 2023-01-03 DIAGNOSIS — J4520 Mild intermittent asthma, uncomplicated: Secondary | ICD-10-CM | POA: Diagnosis not present

## 2023-01-03 DIAGNOSIS — E1151 Type 2 diabetes mellitus with diabetic peripheral angiopathy without gangrene: Secondary | ICD-10-CM | POA: Diagnosis not present

## 2023-01-06 DIAGNOSIS — Z8673 Personal history of transient ischemic attack (TIA), and cerebral infarction without residual deficits: Secondary | ICD-10-CM | POA: Diagnosis not present

## 2023-01-06 DIAGNOSIS — Z87891 Personal history of nicotine dependence: Secondary | ICD-10-CM | POA: Diagnosis not present

## 2023-01-06 DIAGNOSIS — I10 Essential (primary) hypertension: Secondary | ICD-10-CM | POA: Diagnosis not present

## 2023-01-06 DIAGNOSIS — Z7901 Long term (current) use of anticoagulants: Secondary | ICD-10-CM | POA: Diagnosis not present

## 2023-01-06 DIAGNOSIS — E1142 Type 2 diabetes mellitus with diabetic polyneuropathy: Secondary | ICD-10-CM | POA: Diagnosis not present

## 2023-01-06 DIAGNOSIS — E782 Mixed hyperlipidemia: Secondary | ICD-10-CM | POA: Diagnosis not present

## 2023-01-06 DIAGNOSIS — I2510 Atherosclerotic heart disease of native coronary artery without angina pectoris: Secondary | ICD-10-CM | POA: Diagnosis not present

## 2023-01-06 DIAGNOSIS — J4520 Mild intermittent asthma, uncomplicated: Secondary | ICD-10-CM | POA: Diagnosis not present

## 2023-01-06 DIAGNOSIS — Z7902 Long term (current) use of antithrombotics/antiplatelets: Secondary | ICD-10-CM | POA: Diagnosis not present

## 2023-01-06 DIAGNOSIS — R413 Other amnesia: Secondary | ICD-10-CM | POA: Diagnosis not present

## 2023-01-06 DIAGNOSIS — I252 Old myocardial infarction: Secondary | ICD-10-CM | POA: Diagnosis not present

## 2023-01-06 DIAGNOSIS — E1151 Type 2 diabetes mellitus with diabetic peripheral angiopathy without gangrene: Secondary | ICD-10-CM | POA: Diagnosis not present

## 2023-01-06 DIAGNOSIS — K760 Fatty (change of) liver, not elsewhere classified: Secondary | ICD-10-CM | POA: Diagnosis not present

## 2023-01-06 DIAGNOSIS — I480 Paroxysmal atrial fibrillation: Secondary | ICD-10-CM | POA: Diagnosis not present

## 2023-01-06 DIAGNOSIS — E039 Hypothyroidism, unspecified: Secondary | ICD-10-CM | POA: Diagnosis not present

## 2023-01-06 DIAGNOSIS — E538 Deficiency of other specified B group vitamins: Secondary | ICD-10-CM | POA: Diagnosis not present

## 2023-01-06 DIAGNOSIS — Z7984 Long term (current) use of oral hypoglycemic drugs: Secondary | ICD-10-CM | POA: Diagnosis not present

## 2023-01-06 DIAGNOSIS — E559 Vitamin D deficiency, unspecified: Secondary | ICD-10-CM | POA: Diagnosis not present

## 2023-01-06 DIAGNOSIS — M159 Polyosteoarthritis, unspecified: Secondary | ICD-10-CM | POA: Diagnosis not present

## 2023-01-06 DIAGNOSIS — L853 Xerosis cutis: Secondary | ICD-10-CM | POA: Diagnosis not present

## 2023-01-06 DIAGNOSIS — Z87440 Personal history of urinary (tract) infections: Secondary | ICD-10-CM | POA: Diagnosis not present

## 2023-01-06 DIAGNOSIS — R569 Unspecified convulsions: Secondary | ICD-10-CM | POA: Diagnosis not present

## 2023-01-10 DIAGNOSIS — Z8673 Personal history of transient ischemic attack (TIA), and cerebral infarction without residual deficits: Secondary | ICD-10-CM | POA: Diagnosis not present

## 2023-01-10 DIAGNOSIS — Z7984 Long term (current) use of oral hypoglycemic drugs: Secondary | ICD-10-CM | POA: Diagnosis not present

## 2023-01-10 DIAGNOSIS — I10 Essential (primary) hypertension: Secondary | ICD-10-CM | POA: Diagnosis not present

## 2023-01-10 DIAGNOSIS — E039 Hypothyroidism, unspecified: Secondary | ICD-10-CM | POA: Diagnosis not present

## 2023-01-10 DIAGNOSIS — E538 Deficiency of other specified B group vitamins: Secondary | ICD-10-CM | POA: Diagnosis not present

## 2023-01-10 DIAGNOSIS — L853 Xerosis cutis: Secondary | ICD-10-CM | POA: Diagnosis not present

## 2023-01-10 DIAGNOSIS — Z7902 Long term (current) use of antithrombotics/antiplatelets: Secondary | ICD-10-CM | POA: Diagnosis not present

## 2023-01-10 DIAGNOSIS — K760 Fatty (change of) liver, not elsewhere classified: Secondary | ICD-10-CM | POA: Diagnosis not present

## 2023-01-10 DIAGNOSIS — I2510 Atherosclerotic heart disease of native coronary artery without angina pectoris: Secondary | ICD-10-CM | POA: Diagnosis not present

## 2023-01-10 DIAGNOSIS — R569 Unspecified convulsions: Secondary | ICD-10-CM | POA: Diagnosis not present

## 2023-01-10 DIAGNOSIS — I252 Old myocardial infarction: Secondary | ICD-10-CM | POA: Diagnosis not present

## 2023-01-10 DIAGNOSIS — E782 Mixed hyperlipidemia: Secondary | ICD-10-CM | POA: Diagnosis not present

## 2023-01-10 DIAGNOSIS — E1142 Type 2 diabetes mellitus with diabetic polyneuropathy: Secondary | ICD-10-CM | POA: Diagnosis not present

## 2023-01-10 DIAGNOSIS — E1151 Type 2 diabetes mellitus with diabetic peripheral angiopathy without gangrene: Secondary | ICD-10-CM | POA: Diagnosis not present

## 2023-01-10 DIAGNOSIS — R413 Other amnesia: Secondary | ICD-10-CM | POA: Diagnosis not present

## 2023-01-10 DIAGNOSIS — Z7901 Long term (current) use of anticoagulants: Secondary | ICD-10-CM | POA: Diagnosis not present

## 2023-01-10 DIAGNOSIS — J4520 Mild intermittent asthma, uncomplicated: Secondary | ICD-10-CM | POA: Diagnosis not present

## 2023-01-10 DIAGNOSIS — Z87891 Personal history of nicotine dependence: Secondary | ICD-10-CM | POA: Diagnosis not present

## 2023-01-10 DIAGNOSIS — Z87440 Personal history of urinary (tract) infections: Secondary | ICD-10-CM | POA: Diagnosis not present

## 2023-01-10 DIAGNOSIS — E559 Vitamin D deficiency, unspecified: Secondary | ICD-10-CM | POA: Diagnosis not present

## 2023-01-10 DIAGNOSIS — M159 Polyosteoarthritis, unspecified: Secondary | ICD-10-CM | POA: Diagnosis not present

## 2023-01-10 DIAGNOSIS — I480 Paroxysmal atrial fibrillation: Secondary | ICD-10-CM | POA: Diagnosis not present

## 2023-01-14 DIAGNOSIS — R413 Other amnesia: Secondary | ICD-10-CM | POA: Diagnosis not present

## 2023-01-14 DIAGNOSIS — I10 Essential (primary) hypertension: Secondary | ICD-10-CM | POA: Diagnosis not present

## 2023-01-14 DIAGNOSIS — I2510 Atherosclerotic heart disease of native coronary artery without angina pectoris: Secondary | ICD-10-CM | POA: Diagnosis not present

## 2023-01-14 DIAGNOSIS — Z8673 Personal history of transient ischemic attack (TIA), and cerebral infarction without residual deficits: Secondary | ICD-10-CM | POA: Diagnosis not present

## 2023-01-14 DIAGNOSIS — E782 Mixed hyperlipidemia: Secondary | ICD-10-CM | POA: Diagnosis not present

## 2023-01-14 DIAGNOSIS — E538 Deficiency of other specified B group vitamins: Secondary | ICD-10-CM | POA: Diagnosis not present

## 2023-01-14 DIAGNOSIS — R569 Unspecified convulsions: Secondary | ICD-10-CM | POA: Diagnosis not present

## 2023-01-14 DIAGNOSIS — Z7902 Long term (current) use of antithrombotics/antiplatelets: Secondary | ICD-10-CM | POA: Diagnosis not present

## 2023-01-14 DIAGNOSIS — I252 Old myocardial infarction: Secondary | ICD-10-CM | POA: Diagnosis not present

## 2023-01-14 DIAGNOSIS — E1142 Type 2 diabetes mellitus with diabetic polyneuropathy: Secondary | ICD-10-CM | POA: Diagnosis not present

## 2023-01-14 DIAGNOSIS — E039 Hypothyroidism, unspecified: Secondary | ICD-10-CM | POA: Diagnosis not present

## 2023-01-14 DIAGNOSIS — I480 Paroxysmal atrial fibrillation: Secondary | ICD-10-CM | POA: Diagnosis not present

## 2023-01-14 DIAGNOSIS — Z87440 Personal history of urinary (tract) infections: Secondary | ICD-10-CM | POA: Diagnosis not present

## 2023-01-14 DIAGNOSIS — L853 Xerosis cutis: Secondary | ICD-10-CM | POA: Diagnosis not present

## 2023-01-14 DIAGNOSIS — E559 Vitamin D deficiency, unspecified: Secondary | ICD-10-CM | POA: Diagnosis not present

## 2023-01-14 DIAGNOSIS — E1151 Type 2 diabetes mellitus with diabetic peripheral angiopathy without gangrene: Secondary | ICD-10-CM | POA: Diagnosis not present

## 2023-01-14 DIAGNOSIS — M159 Polyosteoarthritis, unspecified: Secondary | ICD-10-CM | POA: Diagnosis not present

## 2023-01-14 DIAGNOSIS — J4520 Mild intermittent asthma, uncomplicated: Secondary | ICD-10-CM | POA: Diagnosis not present

## 2023-01-14 DIAGNOSIS — Z7984 Long term (current) use of oral hypoglycemic drugs: Secondary | ICD-10-CM | POA: Diagnosis not present

## 2023-01-14 DIAGNOSIS — Z7901 Long term (current) use of anticoagulants: Secondary | ICD-10-CM | POA: Diagnosis not present

## 2023-01-14 DIAGNOSIS — Z87891 Personal history of nicotine dependence: Secondary | ICD-10-CM | POA: Diagnosis not present

## 2023-01-14 DIAGNOSIS — K760 Fatty (change of) liver, not elsewhere classified: Secondary | ICD-10-CM | POA: Diagnosis not present

## 2023-01-16 DIAGNOSIS — E559 Vitamin D deficiency, unspecified: Secondary | ICD-10-CM | POA: Diagnosis not present

## 2023-01-16 DIAGNOSIS — I2510 Atherosclerotic heart disease of native coronary artery without angina pectoris: Secondary | ICD-10-CM | POA: Diagnosis not present

## 2023-01-16 DIAGNOSIS — E782 Mixed hyperlipidemia: Secondary | ICD-10-CM | POA: Diagnosis not present

## 2023-01-16 DIAGNOSIS — Z7902 Long term (current) use of antithrombotics/antiplatelets: Secondary | ICD-10-CM | POA: Diagnosis not present

## 2023-01-16 DIAGNOSIS — R413 Other amnesia: Secondary | ICD-10-CM | POA: Diagnosis not present

## 2023-01-16 DIAGNOSIS — I252 Old myocardial infarction: Secondary | ICD-10-CM | POA: Diagnosis not present

## 2023-01-16 DIAGNOSIS — L853 Xerosis cutis: Secondary | ICD-10-CM | POA: Diagnosis not present

## 2023-01-16 DIAGNOSIS — R569 Unspecified convulsions: Secondary | ICD-10-CM | POA: Diagnosis not present

## 2023-01-16 DIAGNOSIS — E1151 Type 2 diabetes mellitus with diabetic peripheral angiopathy without gangrene: Secondary | ICD-10-CM | POA: Diagnosis not present

## 2023-01-16 DIAGNOSIS — Z87891 Personal history of nicotine dependence: Secondary | ICD-10-CM | POA: Diagnosis not present

## 2023-01-16 DIAGNOSIS — Z7901 Long term (current) use of anticoagulants: Secondary | ICD-10-CM | POA: Diagnosis not present

## 2023-01-16 DIAGNOSIS — E039 Hypothyroidism, unspecified: Secondary | ICD-10-CM | POA: Diagnosis not present

## 2023-01-16 DIAGNOSIS — M159 Polyosteoarthritis, unspecified: Secondary | ICD-10-CM | POA: Diagnosis not present

## 2023-01-16 DIAGNOSIS — R8290 Unspecified abnormal findings in urine: Secondary | ICD-10-CM | POA: Diagnosis not present

## 2023-01-16 DIAGNOSIS — I10 Essential (primary) hypertension: Secondary | ICD-10-CM | POA: Diagnosis not present

## 2023-01-16 DIAGNOSIS — K760 Fatty (change of) liver, not elsewhere classified: Secondary | ICD-10-CM | POA: Diagnosis not present

## 2023-01-16 DIAGNOSIS — J4520 Mild intermittent asthma, uncomplicated: Secondary | ICD-10-CM | POA: Diagnosis not present

## 2023-01-16 DIAGNOSIS — I480 Paroxysmal atrial fibrillation: Secondary | ICD-10-CM | POA: Diagnosis not present

## 2023-01-16 DIAGNOSIS — Z7984 Long term (current) use of oral hypoglycemic drugs: Secondary | ICD-10-CM | POA: Diagnosis not present

## 2023-01-16 DIAGNOSIS — Z8673 Personal history of transient ischemic attack (TIA), and cerebral infarction without residual deficits: Secondary | ICD-10-CM | POA: Diagnosis not present

## 2023-01-16 DIAGNOSIS — Z87440 Personal history of urinary (tract) infections: Secondary | ICD-10-CM | POA: Diagnosis not present

## 2023-01-16 DIAGNOSIS — E538 Deficiency of other specified B group vitamins: Secondary | ICD-10-CM | POA: Diagnosis not present

## 2023-01-16 DIAGNOSIS — E1142 Type 2 diabetes mellitus with diabetic polyneuropathy: Secondary | ICD-10-CM | POA: Diagnosis not present

## 2023-01-18 DIAGNOSIS — M6281 Muscle weakness (generalized): Secondary | ICD-10-CM | POA: Diagnosis not present

## 2023-01-21 DIAGNOSIS — Z743 Need for continuous supervision: Secondary | ICD-10-CM | POA: Diagnosis not present

## 2023-01-21 DIAGNOSIS — Z8673 Personal history of transient ischemic attack (TIA), and cerebral infarction without residual deficits: Secondary | ICD-10-CM | POA: Diagnosis not present

## 2023-01-21 DIAGNOSIS — R6889 Other general symptoms and signs: Secondary | ICD-10-CM | POA: Diagnosis not present

## 2023-01-21 DIAGNOSIS — R531 Weakness: Secondary | ICD-10-CM | POA: Diagnosis not present

## 2023-01-21 DIAGNOSIS — R7401 Elevation of levels of liver transaminase levels: Secondary | ICD-10-CM | POA: Diagnosis not present

## 2023-01-21 DIAGNOSIS — Z885 Allergy status to narcotic agent status: Secondary | ICD-10-CM | POA: Diagnosis not present

## 2023-01-21 DIAGNOSIS — Z681 Body mass index (BMI) 19 or less, adult: Secondary | ICD-10-CM | POA: Diagnosis not present

## 2023-01-21 DIAGNOSIS — Z7984 Long term (current) use of oral hypoglycemic drugs: Secondary | ICD-10-CM | POA: Diagnosis not present

## 2023-01-21 DIAGNOSIS — I4891 Unspecified atrial fibrillation: Secondary | ICD-10-CM | POA: Diagnosis not present

## 2023-01-21 DIAGNOSIS — N3000 Acute cystitis without hematuria: Secondary | ICD-10-CM | POA: Diagnosis not present

## 2023-01-21 DIAGNOSIS — J45909 Unspecified asthma, uncomplicated: Secondary | ICD-10-CM | POA: Diagnosis not present

## 2023-01-21 DIAGNOSIS — E7800 Pure hypercholesterolemia, unspecified: Secondary | ICD-10-CM | POA: Diagnosis not present

## 2023-01-21 DIAGNOSIS — N390 Urinary tract infection, site not specified: Secondary | ICD-10-CM | POA: Diagnosis not present

## 2023-01-21 DIAGNOSIS — Z452 Encounter for adjustment and management of vascular access device: Secondary | ICD-10-CM | POA: Diagnosis not present

## 2023-01-21 DIAGNOSIS — E119 Type 2 diabetes mellitus without complications: Secondary | ICD-10-CM | POA: Diagnosis not present

## 2023-01-21 DIAGNOSIS — K219 Gastro-esophageal reflux disease without esophagitis: Secondary | ICD-10-CM | POA: Diagnosis not present

## 2023-01-21 DIAGNOSIS — R748 Abnormal levels of other serum enzymes: Secondary | ICD-10-CM | POA: Diagnosis not present

## 2023-01-21 DIAGNOSIS — L89150 Pressure ulcer of sacral region, unstageable: Secondary | ICD-10-CM | POA: Diagnosis not present

## 2023-01-21 DIAGNOSIS — I10 Essential (primary) hypertension: Secondary | ICD-10-CM | POA: Diagnosis not present

## 2023-01-21 DIAGNOSIS — E43 Unspecified severe protein-calorie malnutrition: Secondary | ICD-10-CM | POA: Diagnosis not present

## 2023-01-21 DIAGNOSIS — Z7901 Long term (current) use of anticoagulants: Secondary | ICD-10-CM | POA: Diagnosis not present

## 2023-01-21 DIAGNOSIS — Z7902 Long term (current) use of antithrombotics/antiplatelets: Secondary | ICD-10-CM | POA: Diagnosis not present

## 2023-01-21 DIAGNOSIS — Z1624 Resistance to multiple antibiotics: Secondary | ICD-10-CM | POA: Diagnosis not present

## 2023-01-21 DIAGNOSIS — I252 Old myocardial infarction: Secondary | ICD-10-CM | POA: Diagnosis not present

## 2023-01-21 DIAGNOSIS — B964 Proteus (mirabilis) (morganii) as the cause of diseases classified elsewhere: Secondary | ICD-10-CM | POA: Diagnosis not present

## 2023-01-21 DIAGNOSIS — Z880 Allergy status to penicillin: Secondary | ICD-10-CM | POA: Diagnosis not present

## 2023-01-21 DIAGNOSIS — E039 Hypothyroidism, unspecified: Secondary | ICD-10-CM | POA: Diagnosis not present

## 2023-01-21 DIAGNOSIS — M1990 Unspecified osteoarthritis, unspecified site: Secondary | ICD-10-CM | POA: Diagnosis not present

## 2023-01-21 DIAGNOSIS — Z87440 Personal history of urinary (tract) infections: Secondary | ICD-10-CM | POA: Diagnosis not present

## 2023-01-25 DIAGNOSIS — I480 Paroxysmal atrial fibrillation: Secondary | ICD-10-CM | POA: Diagnosis not present

## 2023-01-25 DIAGNOSIS — L853 Xerosis cutis: Secondary | ICD-10-CM | POA: Diagnosis not present

## 2023-01-25 DIAGNOSIS — I2510 Atherosclerotic heart disease of native coronary artery without angina pectoris: Secondary | ICD-10-CM | POA: Diagnosis not present

## 2023-01-25 DIAGNOSIS — B964 Proteus (mirabilis) (morganii) as the cause of diseases classified elsewhere: Secondary | ICD-10-CM | POA: Diagnosis not present

## 2023-01-25 DIAGNOSIS — E782 Mixed hyperlipidemia: Secondary | ICD-10-CM | POA: Diagnosis not present

## 2023-01-25 DIAGNOSIS — I10 Essential (primary) hypertension: Secondary | ICD-10-CM | POA: Diagnosis not present

## 2023-01-25 DIAGNOSIS — E1142 Type 2 diabetes mellitus with diabetic polyneuropathy: Secondary | ICD-10-CM | POA: Diagnosis not present

## 2023-01-25 DIAGNOSIS — K5730 Diverticulosis of large intestine without perforation or abscess without bleeding: Secondary | ICD-10-CM | POA: Diagnosis not present

## 2023-01-25 DIAGNOSIS — E43 Unspecified severe protein-calorie malnutrition: Secondary | ICD-10-CM | POA: Diagnosis not present

## 2023-01-25 DIAGNOSIS — M159 Polyosteoarthritis, unspecified: Secondary | ICD-10-CM | POA: Diagnosis not present

## 2023-01-25 DIAGNOSIS — E039 Hypothyroidism, unspecified: Secondary | ICD-10-CM | POA: Diagnosis not present

## 2023-01-25 DIAGNOSIS — E559 Vitamin D deficiency, unspecified: Secondary | ICD-10-CM | POA: Diagnosis not present

## 2023-01-25 DIAGNOSIS — K219 Gastro-esophageal reflux disease without esophagitis: Secondary | ICD-10-CM | POA: Diagnosis not present

## 2023-01-25 DIAGNOSIS — E538 Deficiency of other specified B group vitamins: Secondary | ICD-10-CM | POA: Diagnosis not present

## 2023-01-25 DIAGNOSIS — R161 Splenomegaly, not elsewhere classified: Secondary | ICD-10-CM | POA: Diagnosis not present

## 2023-01-25 DIAGNOSIS — K760 Fatty (change of) liver, not elsewhere classified: Secondary | ICD-10-CM | POA: Diagnosis not present

## 2023-01-25 DIAGNOSIS — M5440 Lumbago with sciatica, unspecified side: Secondary | ICD-10-CM | POA: Diagnosis not present

## 2023-01-25 DIAGNOSIS — I951 Orthostatic hypotension: Secondary | ICD-10-CM | POA: Diagnosis not present

## 2023-01-25 DIAGNOSIS — J45998 Other asthma: Secondary | ICD-10-CM | POA: Diagnosis not present

## 2023-01-25 DIAGNOSIS — N390 Urinary tract infection, site not specified: Secondary | ICD-10-CM | POA: Diagnosis not present

## 2023-01-25 DIAGNOSIS — L89150 Pressure ulcer of sacral region, unstageable: Secondary | ICD-10-CM | POA: Diagnosis not present

## 2023-01-25 DIAGNOSIS — E1151 Type 2 diabetes mellitus with diabetic peripheral angiopathy without gangrene: Secondary | ICD-10-CM | POA: Diagnosis not present

## 2023-01-28 DIAGNOSIS — E559 Vitamin D deficiency, unspecified: Secondary | ICD-10-CM | POA: Diagnosis not present

## 2023-01-28 DIAGNOSIS — I951 Orthostatic hypotension: Secondary | ICD-10-CM | POA: Diagnosis not present

## 2023-01-28 DIAGNOSIS — E43 Unspecified severe protein-calorie malnutrition: Secondary | ICD-10-CM | POA: Diagnosis not present

## 2023-01-28 DIAGNOSIS — I480 Paroxysmal atrial fibrillation: Secondary | ICD-10-CM | POA: Diagnosis not present

## 2023-01-28 DIAGNOSIS — J45998 Other asthma: Secondary | ICD-10-CM | POA: Diagnosis not present

## 2023-01-28 DIAGNOSIS — K219 Gastro-esophageal reflux disease without esophagitis: Secondary | ICD-10-CM | POA: Diagnosis not present

## 2023-01-28 DIAGNOSIS — E1151 Type 2 diabetes mellitus with diabetic peripheral angiopathy without gangrene: Secondary | ICD-10-CM | POA: Diagnosis not present

## 2023-01-28 DIAGNOSIS — B964 Proteus (mirabilis) (morganii) as the cause of diseases classified elsewhere: Secondary | ICD-10-CM | POA: Diagnosis not present

## 2023-01-28 DIAGNOSIS — E1142 Type 2 diabetes mellitus with diabetic polyneuropathy: Secondary | ICD-10-CM | POA: Diagnosis not present

## 2023-01-28 DIAGNOSIS — R161 Splenomegaly, not elsewhere classified: Secondary | ICD-10-CM | POA: Diagnosis not present

## 2023-01-28 DIAGNOSIS — K760 Fatty (change of) liver, not elsewhere classified: Secondary | ICD-10-CM | POA: Diagnosis not present

## 2023-01-28 DIAGNOSIS — E782 Mixed hyperlipidemia: Secondary | ICD-10-CM | POA: Diagnosis not present

## 2023-01-28 DIAGNOSIS — L853 Xerosis cutis: Secondary | ICD-10-CM | POA: Diagnosis not present

## 2023-01-28 DIAGNOSIS — K5730 Diverticulosis of large intestine without perforation or abscess without bleeding: Secondary | ICD-10-CM | POA: Diagnosis not present

## 2023-01-28 DIAGNOSIS — E039 Hypothyroidism, unspecified: Secondary | ICD-10-CM | POA: Diagnosis not present

## 2023-01-28 DIAGNOSIS — E538 Deficiency of other specified B group vitamins: Secondary | ICD-10-CM | POA: Diagnosis not present

## 2023-01-28 DIAGNOSIS — M5440 Lumbago with sciatica, unspecified side: Secondary | ICD-10-CM | POA: Diagnosis not present

## 2023-01-28 DIAGNOSIS — I2510 Atherosclerotic heart disease of native coronary artery without angina pectoris: Secondary | ICD-10-CM | POA: Diagnosis not present

## 2023-01-28 DIAGNOSIS — N390 Urinary tract infection, site not specified: Secondary | ICD-10-CM | POA: Diagnosis not present

## 2023-01-28 DIAGNOSIS — M159 Polyosteoarthritis, unspecified: Secondary | ICD-10-CM | POA: Diagnosis not present

## 2023-01-28 DIAGNOSIS — I10 Essential (primary) hypertension: Secondary | ICD-10-CM | POA: Diagnosis not present

## 2023-01-28 DIAGNOSIS — L89150 Pressure ulcer of sacral region, unstageable: Secondary | ICD-10-CM | POA: Diagnosis not present

## 2023-01-29 DIAGNOSIS — R7989 Other specified abnormal findings of blood chemistry: Secondary | ICD-10-CM | POA: Diagnosis not present

## 2023-01-29 DIAGNOSIS — E1169 Type 2 diabetes mellitus with other specified complication: Secondary | ICD-10-CM | POA: Diagnosis not present

## 2023-01-29 DIAGNOSIS — L89159 Pressure ulcer of sacral region, unspecified stage: Secondary | ICD-10-CM | POA: Diagnosis not present

## 2023-01-29 DIAGNOSIS — E46 Unspecified protein-calorie malnutrition: Secondary | ICD-10-CM | POA: Diagnosis not present

## 2023-01-29 DIAGNOSIS — N390 Urinary tract infection, site not specified: Secondary | ICD-10-CM | POA: Diagnosis not present

## 2023-01-29 DIAGNOSIS — Z09 Encounter for follow-up examination after completed treatment for conditions other than malignant neoplasm: Secondary | ICD-10-CM | POA: Diagnosis not present

## 2023-01-30 DIAGNOSIS — I951 Orthostatic hypotension: Secondary | ICD-10-CM | POA: Diagnosis not present

## 2023-01-30 DIAGNOSIS — J45998 Other asthma: Secondary | ICD-10-CM | POA: Diagnosis not present

## 2023-01-30 DIAGNOSIS — K760 Fatty (change of) liver, not elsewhere classified: Secondary | ICD-10-CM | POA: Diagnosis not present

## 2023-01-30 DIAGNOSIS — E039 Hypothyroidism, unspecified: Secondary | ICD-10-CM | POA: Diagnosis not present

## 2023-01-30 DIAGNOSIS — L853 Xerosis cutis: Secondary | ICD-10-CM | POA: Diagnosis not present

## 2023-01-30 DIAGNOSIS — E1151 Type 2 diabetes mellitus with diabetic peripheral angiopathy without gangrene: Secondary | ICD-10-CM | POA: Diagnosis not present

## 2023-01-30 DIAGNOSIS — K219 Gastro-esophageal reflux disease without esophagitis: Secondary | ICD-10-CM | POA: Diagnosis not present

## 2023-01-30 DIAGNOSIS — Z5181 Encounter for therapeutic drug level monitoring: Secondary | ICD-10-CM | POA: Diagnosis not present

## 2023-01-30 DIAGNOSIS — N390 Urinary tract infection, site not specified: Secondary | ICD-10-CM | POA: Diagnosis not present

## 2023-01-30 DIAGNOSIS — B964 Proteus (mirabilis) (morganii) as the cause of diseases classified elsewhere: Secondary | ICD-10-CM | POA: Diagnosis not present

## 2023-01-30 DIAGNOSIS — K5730 Diverticulosis of large intestine without perforation or abscess without bleeding: Secondary | ICD-10-CM | POA: Diagnosis not present

## 2023-01-30 DIAGNOSIS — L89150 Pressure ulcer of sacral region, unstageable: Secondary | ICD-10-CM | POA: Diagnosis not present

## 2023-01-30 DIAGNOSIS — E538 Deficiency of other specified B group vitamins: Secondary | ICD-10-CM | POA: Diagnosis not present

## 2023-01-30 DIAGNOSIS — M5440 Lumbago with sciatica, unspecified side: Secondary | ICD-10-CM | POA: Diagnosis not present

## 2023-01-30 DIAGNOSIS — E782 Mixed hyperlipidemia: Secondary | ICD-10-CM | POA: Diagnosis not present

## 2023-01-30 DIAGNOSIS — E1142 Type 2 diabetes mellitus with diabetic polyneuropathy: Secondary | ICD-10-CM | POA: Diagnosis not present

## 2023-01-30 DIAGNOSIS — I2510 Atherosclerotic heart disease of native coronary artery without angina pectoris: Secondary | ICD-10-CM | POA: Diagnosis not present

## 2023-01-30 DIAGNOSIS — I480 Paroxysmal atrial fibrillation: Secondary | ICD-10-CM | POA: Diagnosis not present

## 2023-01-30 DIAGNOSIS — M159 Polyosteoarthritis, unspecified: Secondary | ICD-10-CM | POA: Diagnosis not present

## 2023-01-30 DIAGNOSIS — E43 Unspecified severe protein-calorie malnutrition: Secondary | ICD-10-CM | POA: Diagnosis not present

## 2023-01-30 DIAGNOSIS — R161 Splenomegaly, not elsewhere classified: Secondary | ICD-10-CM | POA: Diagnosis not present

## 2023-01-30 DIAGNOSIS — I10 Essential (primary) hypertension: Secondary | ICD-10-CM | POA: Diagnosis not present

## 2023-01-30 DIAGNOSIS — E559 Vitamin D deficiency, unspecified: Secondary | ICD-10-CM | POA: Diagnosis not present

## 2023-02-04 DIAGNOSIS — K5730 Diverticulosis of large intestine without perforation or abscess without bleeding: Secondary | ICD-10-CM | POA: Diagnosis not present

## 2023-02-04 DIAGNOSIS — I951 Orthostatic hypotension: Secondary | ICD-10-CM | POA: Diagnosis not present

## 2023-02-04 DIAGNOSIS — R161 Splenomegaly, not elsewhere classified: Secondary | ICD-10-CM | POA: Diagnosis not present

## 2023-02-04 DIAGNOSIS — N390 Urinary tract infection, site not specified: Secondary | ICD-10-CM | POA: Diagnosis not present

## 2023-02-04 DIAGNOSIS — J45998 Other asthma: Secondary | ICD-10-CM | POA: Diagnosis not present

## 2023-02-04 DIAGNOSIS — E1151 Type 2 diabetes mellitus with diabetic peripheral angiopathy without gangrene: Secondary | ICD-10-CM | POA: Diagnosis not present

## 2023-02-04 DIAGNOSIS — B964 Proteus (mirabilis) (morganii) as the cause of diseases classified elsewhere: Secondary | ICD-10-CM | POA: Diagnosis not present

## 2023-02-04 DIAGNOSIS — I2510 Atherosclerotic heart disease of native coronary artery without angina pectoris: Secondary | ICD-10-CM | POA: Diagnosis not present

## 2023-02-04 DIAGNOSIS — E039 Hypothyroidism, unspecified: Secondary | ICD-10-CM | POA: Diagnosis not present

## 2023-02-04 DIAGNOSIS — E559 Vitamin D deficiency, unspecified: Secondary | ICD-10-CM | POA: Diagnosis not present

## 2023-02-04 DIAGNOSIS — L89150 Pressure ulcer of sacral region, unstageable: Secondary | ICD-10-CM | POA: Diagnosis not present

## 2023-02-04 DIAGNOSIS — M5440 Lumbago with sciatica, unspecified side: Secondary | ICD-10-CM | POA: Diagnosis not present

## 2023-02-04 DIAGNOSIS — E538 Deficiency of other specified B group vitamins: Secondary | ICD-10-CM | POA: Diagnosis not present

## 2023-02-04 DIAGNOSIS — I10 Essential (primary) hypertension: Secondary | ICD-10-CM | POA: Diagnosis not present

## 2023-02-04 DIAGNOSIS — K760 Fatty (change of) liver, not elsewhere classified: Secondary | ICD-10-CM | POA: Diagnosis not present

## 2023-02-04 DIAGNOSIS — M159 Polyosteoarthritis, unspecified: Secondary | ICD-10-CM | POA: Diagnosis not present

## 2023-02-04 DIAGNOSIS — I480 Paroxysmal atrial fibrillation: Secondary | ICD-10-CM | POA: Diagnosis not present

## 2023-02-04 DIAGNOSIS — E1142 Type 2 diabetes mellitus with diabetic polyneuropathy: Secondary | ICD-10-CM | POA: Diagnosis not present

## 2023-02-04 DIAGNOSIS — E782 Mixed hyperlipidemia: Secondary | ICD-10-CM | POA: Diagnosis not present

## 2023-02-04 DIAGNOSIS — E43 Unspecified severe protein-calorie malnutrition: Secondary | ICD-10-CM | POA: Diagnosis not present

## 2023-02-04 DIAGNOSIS — K219 Gastro-esophageal reflux disease without esophagitis: Secondary | ICD-10-CM | POA: Diagnosis not present

## 2023-02-04 DIAGNOSIS — L853 Xerosis cutis: Secondary | ICD-10-CM | POA: Diagnosis not present

## 2023-02-07 ENCOUNTER — Telehealth: Payer: Self-pay | Admitting: Cardiology

## 2023-02-07 ENCOUNTER — Other Ambulatory Visit: Payer: Self-pay

## 2023-02-07 MED ORDER — RIVAROXABAN 15 MG PO TABS: 15.0000 mg | ORAL_TABLET | Freq: Every day | ORAL | 12 refills | Status: DC

## 2023-02-07 MED ORDER — RIVAROXABAN 15 MG PO TABS: 15.0000 mg | ORAL_TABLET | Freq: Two times a day (BID) | ORAL | 12 refills | Status: DC

## 2023-02-07 NOTE — Telephone Encounter (Signed)
Called and spoke with [facility] per DPR who wanted to make sure it is ok for pt to stop her Plavix, Atorvastatin and Metformin as advised in the hospital due to elevate LFT's. AST was 239 and ALT was 158. Advised to follow the recommendation of the hospital and follow up with PCP/GI. Advised that these medications can cause LFT's to be elevated as well. Please advise.

## 2023-02-07 NOTE — Telephone Encounter (Signed)
Elaine Mathews requested that a new RX be sent to [place] with new Xarelto dose.

## 2023-02-07 NOTE — Telephone Encounter (Signed)
Pt's son is requesting a callback at [phone] or on his cell [phone] regarding the hospital changing some of her medications and she does pill packs but now it's time to get them made and he's unsure of what to do. Please advise

## 2023-02-10 DIAGNOSIS — N390 Urinary tract infection, site not specified: Secondary | ICD-10-CM | POA: Diagnosis not present

## 2023-02-10 DIAGNOSIS — E782 Mixed hyperlipidemia: Secondary | ICD-10-CM | POA: Diagnosis not present

## 2023-02-10 DIAGNOSIS — E43 Unspecified severe protein-calorie malnutrition: Secondary | ICD-10-CM | POA: Diagnosis not present

## 2023-02-10 DIAGNOSIS — L89150 Pressure ulcer of sacral region, unstageable: Secondary | ICD-10-CM | POA: Diagnosis not present

## 2023-02-10 DIAGNOSIS — L853 Xerosis cutis: Secondary | ICD-10-CM | POA: Diagnosis not present

## 2023-02-10 DIAGNOSIS — M159 Polyosteoarthritis, unspecified: Secondary | ICD-10-CM | POA: Diagnosis not present

## 2023-02-10 DIAGNOSIS — J45998 Other asthma: Secondary | ICD-10-CM | POA: Diagnosis not present

## 2023-02-10 DIAGNOSIS — E538 Deficiency of other specified B group vitamins: Secondary | ICD-10-CM | POA: Diagnosis not present

## 2023-02-10 DIAGNOSIS — M5440 Lumbago with sciatica, unspecified side: Secondary | ICD-10-CM | POA: Diagnosis not present

## 2023-02-10 DIAGNOSIS — I480 Paroxysmal atrial fibrillation: Secondary | ICD-10-CM | POA: Diagnosis not present

## 2023-02-10 DIAGNOSIS — R161 Splenomegaly, not elsewhere classified: Secondary | ICD-10-CM | POA: Diagnosis not present

## 2023-02-10 DIAGNOSIS — E039 Hypothyroidism, unspecified: Secondary | ICD-10-CM | POA: Diagnosis not present

## 2023-02-10 DIAGNOSIS — K219 Gastro-esophageal reflux disease without esophagitis: Secondary | ICD-10-CM | POA: Diagnosis not present

## 2023-02-10 DIAGNOSIS — K5730 Diverticulosis of large intestine without perforation or abscess without bleeding: Secondary | ICD-10-CM | POA: Diagnosis not present

## 2023-02-10 DIAGNOSIS — K760 Fatty (change of) liver, not elsewhere classified: Secondary | ICD-10-CM | POA: Diagnosis not present

## 2023-02-10 DIAGNOSIS — E1151 Type 2 diabetes mellitus with diabetic peripheral angiopathy without gangrene: Secondary | ICD-10-CM | POA: Diagnosis not present

## 2023-02-10 DIAGNOSIS — I10 Essential (primary) hypertension: Secondary | ICD-10-CM | POA: Diagnosis not present

## 2023-02-10 DIAGNOSIS — E559 Vitamin D deficiency, unspecified: Secondary | ICD-10-CM | POA: Diagnosis not present

## 2023-02-10 DIAGNOSIS — I951 Orthostatic hypotension: Secondary | ICD-10-CM | POA: Diagnosis not present

## 2023-02-10 DIAGNOSIS — I2510 Atherosclerotic heart disease of native coronary artery without angina pectoris: Secondary | ICD-10-CM | POA: Diagnosis not present

## 2023-02-10 DIAGNOSIS — B964 Proteus (mirabilis) (morganii) as the cause of diseases classified elsewhere: Secondary | ICD-10-CM | POA: Diagnosis not present

## 2023-02-10 DIAGNOSIS — E1142 Type 2 diabetes mellitus with diabetic polyneuropathy: Secondary | ICD-10-CM | POA: Diagnosis not present

## 2023-02-13 DIAGNOSIS — E559 Vitamin D deficiency, unspecified: Secondary | ICD-10-CM | POA: Diagnosis not present

## 2023-02-13 DIAGNOSIS — N390 Urinary tract infection, site not specified: Secondary | ICD-10-CM | POA: Diagnosis not present

## 2023-02-13 DIAGNOSIS — M159 Polyosteoarthritis, unspecified: Secondary | ICD-10-CM | POA: Diagnosis not present

## 2023-02-13 DIAGNOSIS — B964 Proteus (mirabilis) (morganii) as the cause of diseases classified elsewhere: Secondary | ICD-10-CM | POA: Diagnosis not present

## 2023-02-13 DIAGNOSIS — I951 Orthostatic hypotension: Secondary | ICD-10-CM | POA: Diagnosis not present

## 2023-02-13 DIAGNOSIS — L89150 Pressure ulcer of sacral region, unstageable: Secondary | ICD-10-CM | POA: Diagnosis not present

## 2023-02-13 DIAGNOSIS — I480 Paroxysmal atrial fibrillation: Secondary | ICD-10-CM | POA: Diagnosis not present

## 2023-02-13 DIAGNOSIS — E039 Hypothyroidism, unspecified: Secondary | ICD-10-CM | POA: Diagnosis not present

## 2023-02-13 DIAGNOSIS — K760 Fatty (change of) liver, not elsewhere classified: Secondary | ICD-10-CM | POA: Diagnosis not present

## 2023-02-13 DIAGNOSIS — J45998 Other asthma: Secondary | ICD-10-CM | POA: Diagnosis not present

## 2023-02-13 DIAGNOSIS — E43 Unspecified severe protein-calorie malnutrition: Secondary | ICD-10-CM | POA: Diagnosis not present

## 2023-02-13 DIAGNOSIS — E1142 Type 2 diabetes mellitus with diabetic polyneuropathy: Secondary | ICD-10-CM | POA: Diagnosis not present

## 2023-02-13 DIAGNOSIS — K219 Gastro-esophageal reflux disease without esophagitis: Secondary | ICD-10-CM | POA: Diagnosis not present

## 2023-02-13 DIAGNOSIS — E538 Deficiency of other specified B group vitamins: Secondary | ICD-10-CM | POA: Diagnosis not present

## 2023-02-13 DIAGNOSIS — R161 Splenomegaly, not elsewhere classified: Secondary | ICD-10-CM | POA: Diagnosis not present

## 2023-02-13 DIAGNOSIS — K5730 Diverticulosis of large intestine without perforation or abscess without bleeding: Secondary | ICD-10-CM | POA: Diagnosis not present

## 2023-02-13 DIAGNOSIS — E1151 Type 2 diabetes mellitus with diabetic peripheral angiopathy without gangrene: Secondary | ICD-10-CM | POA: Diagnosis not present

## 2023-02-13 DIAGNOSIS — M5440 Lumbago with sciatica, unspecified side: Secondary | ICD-10-CM | POA: Diagnosis not present

## 2023-02-13 DIAGNOSIS — I10 Essential (primary) hypertension: Secondary | ICD-10-CM | POA: Diagnosis not present

## 2023-02-13 DIAGNOSIS — E782 Mixed hyperlipidemia: Secondary | ICD-10-CM | POA: Diagnosis not present

## 2023-02-13 DIAGNOSIS — I2510 Atherosclerotic heart disease of native coronary artery without angina pectoris: Secondary | ICD-10-CM | POA: Diagnosis not present

## 2023-02-13 DIAGNOSIS — L853 Xerosis cutis: Secondary | ICD-10-CM | POA: Diagnosis not present

## 2023-02-18 DIAGNOSIS — M6281 Muscle weakness (generalized): Secondary | ICD-10-CM | POA: Diagnosis not present

## 2023-02-19 ENCOUNTER — Other Ambulatory Visit: Payer: Self-pay | Admitting: Cardiology

## 2023-02-19 DIAGNOSIS — D649 Anemia, unspecified: Secondary | ICD-10-CM | POA: Diagnosis not present

## 2023-02-19 DIAGNOSIS — M159 Polyosteoarthritis, unspecified: Secondary | ICD-10-CM | POA: Diagnosis not present

## 2023-02-19 DIAGNOSIS — H109 Unspecified conjunctivitis: Secondary | ICD-10-CM | POA: Diagnosis not present

## 2023-02-19 DIAGNOSIS — E039 Hypothyroidism, unspecified: Secondary | ICD-10-CM | POA: Diagnosis not present

## 2023-02-19 DIAGNOSIS — I2510 Atherosclerotic heart disease of native coronary artery without angina pectoris: Secondary | ICD-10-CM | POA: Diagnosis not present

## 2023-02-19 DIAGNOSIS — E1142 Type 2 diabetes mellitus with diabetic polyneuropathy: Secondary | ICD-10-CM | POA: Diagnosis not present

## 2023-02-19 DIAGNOSIS — E43 Unspecified severe protein-calorie malnutrition: Secondary | ICD-10-CM | POA: Diagnosis not present

## 2023-02-19 DIAGNOSIS — K760 Fatty (change of) liver, not elsewhere classified: Secondary | ICD-10-CM | POA: Diagnosis not present

## 2023-02-19 DIAGNOSIS — E559 Vitamin D deficiency, unspecified: Secondary | ICD-10-CM | POA: Diagnosis not present

## 2023-02-19 DIAGNOSIS — J45909 Unspecified asthma, uncomplicated: Secondary | ICD-10-CM | POA: Diagnosis not present

## 2023-02-19 DIAGNOSIS — R270 Ataxia, unspecified: Secondary | ICD-10-CM | POA: Diagnosis not present

## 2023-02-19 DIAGNOSIS — N390 Urinary tract infection, site not specified: Secondary | ICD-10-CM | POA: Diagnosis not present

## 2023-02-19 DIAGNOSIS — A419 Sepsis, unspecified organism: Secondary | ICD-10-CM | POA: Diagnosis not present

## 2023-02-19 DIAGNOSIS — E782 Mixed hyperlipidemia: Secondary | ICD-10-CM | POA: Diagnosis not present

## 2023-02-19 DIAGNOSIS — L89626 Pressure-induced deep tissue damage of left heel: Secondary | ICD-10-CM | POA: Diagnosis not present

## 2023-02-19 DIAGNOSIS — R413 Other amnesia: Secondary | ICD-10-CM | POA: Diagnosis not present

## 2023-02-19 DIAGNOSIS — Z7901 Long term (current) use of anticoagulants: Secondary | ICD-10-CM | POA: Diagnosis not present

## 2023-02-19 DIAGNOSIS — L89152 Pressure ulcer of sacral region, stage 2: Secondary | ICD-10-CM | POA: Diagnosis not present

## 2023-02-19 DIAGNOSIS — I10 Essential (primary) hypertension: Secondary | ICD-10-CM | POA: Diagnosis not present

## 2023-02-19 DIAGNOSIS — L853 Xerosis cutis: Secondary | ICD-10-CM | POA: Diagnosis not present

## 2023-02-19 DIAGNOSIS — I480 Paroxysmal atrial fibrillation: Secondary | ICD-10-CM | POA: Diagnosis not present

## 2023-02-19 DIAGNOSIS — E1151 Type 2 diabetes mellitus with diabetic peripheral angiopathy without gangrene: Secondary | ICD-10-CM | POA: Diagnosis not present

## 2023-02-19 DIAGNOSIS — E1169 Type 2 diabetes mellitus with other specified complication: Secondary | ICD-10-CM | POA: Diagnosis not present

## 2023-02-21 DIAGNOSIS — H109 Unspecified conjunctivitis: Secondary | ICD-10-CM | POA: Diagnosis not present

## 2023-02-21 DIAGNOSIS — K760 Fatty (change of) liver, not elsewhere classified: Secondary | ICD-10-CM | POA: Diagnosis not present

## 2023-02-21 DIAGNOSIS — L89152 Pressure ulcer of sacral region, stage 2: Secondary | ICD-10-CM | POA: Diagnosis not present

## 2023-02-21 DIAGNOSIS — R413 Other amnesia: Secondary | ICD-10-CM | POA: Diagnosis not present

## 2023-02-21 DIAGNOSIS — L89626 Pressure-induced deep tissue damage of left heel: Secondary | ICD-10-CM | POA: Diagnosis not present

## 2023-02-21 DIAGNOSIS — I10 Essential (primary) hypertension: Secondary | ICD-10-CM | POA: Diagnosis not present

## 2023-02-21 DIAGNOSIS — E43 Unspecified severe protein-calorie malnutrition: Secondary | ICD-10-CM | POA: Diagnosis not present

## 2023-02-21 DIAGNOSIS — N390 Urinary tract infection, site not specified: Secondary | ICD-10-CM | POA: Diagnosis not present

## 2023-02-21 DIAGNOSIS — A419 Sepsis, unspecified organism: Secondary | ICD-10-CM | POA: Diagnosis not present

## 2023-02-21 DIAGNOSIS — J45909 Unspecified asthma, uncomplicated: Secondary | ICD-10-CM | POA: Diagnosis not present

## 2023-02-21 DIAGNOSIS — R270 Ataxia, unspecified: Secondary | ICD-10-CM | POA: Diagnosis not present

## 2023-02-21 DIAGNOSIS — E1169 Type 2 diabetes mellitus with other specified complication: Secondary | ICD-10-CM | POA: Diagnosis not present

## 2023-02-21 DIAGNOSIS — E1151 Type 2 diabetes mellitus with diabetic peripheral angiopathy without gangrene: Secondary | ICD-10-CM | POA: Diagnosis not present

## 2023-02-21 DIAGNOSIS — E782 Mixed hyperlipidemia: Secondary | ICD-10-CM | POA: Diagnosis not present

## 2023-02-21 DIAGNOSIS — L853 Xerosis cutis: Secondary | ICD-10-CM | POA: Diagnosis not present

## 2023-02-21 DIAGNOSIS — E039 Hypothyroidism, unspecified: Secondary | ICD-10-CM | POA: Diagnosis not present

## 2023-02-21 DIAGNOSIS — Z7901 Long term (current) use of anticoagulants: Secondary | ICD-10-CM | POA: Diagnosis not present

## 2023-02-21 DIAGNOSIS — I480 Paroxysmal atrial fibrillation: Secondary | ICD-10-CM | POA: Diagnosis not present

## 2023-02-21 DIAGNOSIS — M159 Polyosteoarthritis, unspecified: Secondary | ICD-10-CM | POA: Diagnosis not present

## 2023-02-21 DIAGNOSIS — E559 Vitamin D deficiency, unspecified: Secondary | ICD-10-CM | POA: Diagnosis not present

## 2023-02-21 DIAGNOSIS — E1142 Type 2 diabetes mellitus with diabetic polyneuropathy: Secondary | ICD-10-CM | POA: Diagnosis not present

## 2023-02-21 DIAGNOSIS — D649 Anemia, unspecified: Secondary | ICD-10-CM | POA: Diagnosis not present

## 2023-02-21 DIAGNOSIS — I2510 Atherosclerotic heart disease of native coronary artery without angina pectoris: Secondary | ICD-10-CM | POA: Diagnosis not present

## 2023-02-25 DIAGNOSIS — E559 Vitamin D deficiency, unspecified: Secondary | ICD-10-CM | POA: Diagnosis not present

## 2023-02-25 DIAGNOSIS — I480 Paroxysmal atrial fibrillation: Secondary | ICD-10-CM | POA: Diagnosis not present

## 2023-02-25 DIAGNOSIS — J45909 Unspecified asthma, uncomplicated: Secondary | ICD-10-CM | POA: Diagnosis not present

## 2023-02-25 DIAGNOSIS — I2510 Atherosclerotic heart disease of native coronary artery without angina pectoris: Secondary | ICD-10-CM | POA: Diagnosis not present

## 2023-02-25 DIAGNOSIS — R270 Ataxia, unspecified: Secondary | ICD-10-CM | POA: Diagnosis not present

## 2023-02-25 DIAGNOSIS — M159 Polyosteoarthritis, unspecified: Secondary | ICD-10-CM | POA: Diagnosis not present

## 2023-02-25 DIAGNOSIS — L89626 Pressure-induced deep tissue damage of left heel: Secondary | ICD-10-CM | POA: Diagnosis not present

## 2023-02-25 DIAGNOSIS — Z7901 Long term (current) use of anticoagulants: Secondary | ICD-10-CM | POA: Diagnosis not present

## 2023-02-25 DIAGNOSIS — E43 Unspecified severe protein-calorie malnutrition: Secondary | ICD-10-CM | POA: Diagnosis not present

## 2023-02-25 DIAGNOSIS — D649 Anemia, unspecified: Secondary | ICD-10-CM | POA: Diagnosis not present

## 2023-02-25 DIAGNOSIS — R413 Other amnesia: Secondary | ICD-10-CM | POA: Diagnosis not present

## 2023-02-25 DIAGNOSIS — L853 Xerosis cutis: Secondary | ICD-10-CM | POA: Diagnosis not present

## 2023-02-25 DIAGNOSIS — L89152 Pressure ulcer of sacral region, stage 2: Secondary | ICD-10-CM | POA: Diagnosis not present

## 2023-02-25 DIAGNOSIS — E782 Mixed hyperlipidemia: Secondary | ICD-10-CM | POA: Diagnosis not present

## 2023-02-25 DIAGNOSIS — E039 Hypothyroidism, unspecified: Secondary | ICD-10-CM | POA: Diagnosis not present

## 2023-02-25 DIAGNOSIS — E1169 Type 2 diabetes mellitus with other specified complication: Secondary | ICD-10-CM | POA: Diagnosis not present

## 2023-02-25 DIAGNOSIS — K760 Fatty (change of) liver, not elsewhere classified: Secondary | ICD-10-CM | POA: Diagnosis not present

## 2023-02-25 DIAGNOSIS — A419 Sepsis, unspecified organism: Secondary | ICD-10-CM | POA: Diagnosis not present

## 2023-02-25 DIAGNOSIS — H109 Unspecified conjunctivitis: Secondary | ICD-10-CM | POA: Diagnosis not present

## 2023-02-25 DIAGNOSIS — E1142 Type 2 diabetes mellitus with diabetic polyneuropathy: Secondary | ICD-10-CM | POA: Diagnosis not present

## 2023-02-25 DIAGNOSIS — I10 Essential (primary) hypertension: Secondary | ICD-10-CM | POA: Diagnosis not present

## 2023-02-25 DIAGNOSIS — N390 Urinary tract infection, site not specified: Secondary | ICD-10-CM | POA: Diagnosis not present

## 2023-02-25 DIAGNOSIS — E1151 Type 2 diabetes mellitus with diabetic peripheral angiopathy without gangrene: Secondary | ICD-10-CM | POA: Diagnosis not present

## 2023-03-04 DIAGNOSIS — I2510 Atherosclerotic heart disease of native coronary artery without angina pectoris: Secondary | ICD-10-CM | POA: Diagnosis not present

## 2023-03-04 DIAGNOSIS — E1169 Type 2 diabetes mellitus with other specified complication: Secondary | ICD-10-CM | POA: Diagnosis not present

## 2023-03-04 DIAGNOSIS — L853 Xerosis cutis: Secondary | ICD-10-CM | POA: Diagnosis not present

## 2023-03-04 DIAGNOSIS — I480 Paroxysmal atrial fibrillation: Secondary | ICD-10-CM | POA: Diagnosis not present

## 2023-03-04 DIAGNOSIS — E43 Unspecified severe protein-calorie malnutrition: Secondary | ICD-10-CM | POA: Diagnosis not present

## 2023-03-04 DIAGNOSIS — L89152 Pressure ulcer of sacral region, stage 2: Secondary | ICD-10-CM | POA: Diagnosis not present

## 2023-03-04 DIAGNOSIS — L89626 Pressure-induced deep tissue damage of left heel: Secondary | ICD-10-CM | POA: Diagnosis not present

## 2023-03-04 DIAGNOSIS — D649 Anemia, unspecified: Secondary | ICD-10-CM | POA: Diagnosis not present

## 2023-03-04 DIAGNOSIS — I10 Essential (primary) hypertension: Secondary | ICD-10-CM | POA: Diagnosis not present

## 2023-03-04 DIAGNOSIS — E559 Vitamin D deficiency, unspecified: Secondary | ICD-10-CM | POA: Diagnosis not present

## 2023-03-04 DIAGNOSIS — E1151 Type 2 diabetes mellitus with diabetic peripheral angiopathy without gangrene: Secondary | ICD-10-CM | POA: Diagnosis not present

## 2023-03-04 DIAGNOSIS — A419 Sepsis, unspecified organism: Secondary | ICD-10-CM | POA: Diagnosis not present

## 2023-03-04 DIAGNOSIS — N390 Urinary tract infection, site not specified: Secondary | ICD-10-CM | POA: Diagnosis not present

## 2023-03-04 DIAGNOSIS — H109 Unspecified conjunctivitis: Secondary | ICD-10-CM | POA: Diagnosis not present

## 2023-03-04 DIAGNOSIS — E782 Mixed hyperlipidemia: Secondary | ICD-10-CM | POA: Diagnosis not present

## 2023-03-04 DIAGNOSIS — E039 Hypothyroidism, unspecified: Secondary | ICD-10-CM | POA: Diagnosis not present

## 2023-03-04 DIAGNOSIS — K760 Fatty (change of) liver, not elsewhere classified: Secondary | ICD-10-CM | POA: Diagnosis not present

## 2023-03-04 DIAGNOSIS — Z7901 Long term (current) use of anticoagulants: Secondary | ICD-10-CM | POA: Diagnosis not present

## 2023-03-04 DIAGNOSIS — R413 Other amnesia: Secondary | ICD-10-CM | POA: Diagnosis not present

## 2023-03-04 DIAGNOSIS — R270 Ataxia, unspecified: Secondary | ICD-10-CM | POA: Diagnosis not present

## 2023-03-04 DIAGNOSIS — M159 Polyosteoarthritis, unspecified: Secondary | ICD-10-CM | POA: Diagnosis not present

## 2023-03-04 DIAGNOSIS — E1142 Type 2 diabetes mellitus with diabetic polyneuropathy: Secondary | ICD-10-CM | POA: Diagnosis not present

## 2023-03-04 DIAGNOSIS — J45909 Unspecified asthma, uncomplicated: Secondary | ICD-10-CM | POA: Diagnosis not present

## 2023-03-07 DIAGNOSIS — D649 Anemia, unspecified: Secondary | ICD-10-CM | POA: Diagnosis not present

## 2023-03-07 DIAGNOSIS — E559 Vitamin D deficiency, unspecified: Secondary | ICD-10-CM | POA: Diagnosis not present

## 2023-03-07 DIAGNOSIS — R270 Ataxia, unspecified: Secondary | ICD-10-CM | POA: Diagnosis not present

## 2023-03-07 DIAGNOSIS — E1151 Type 2 diabetes mellitus with diabetic peripheral angiopathy without gangrene: Secondary | ICD-10-CM | POA: Diagnosis not present

## 2023-03-07 DIAGNOSIS — I480 Paroxysmal atrial fibrillation: Secondary | ICD-10-CM | POA: Diagnosis not present

## 2023-03-07 DIAGNOSIS — H109 Unspecified conjunctivitis: Secondary | ICD-10-CM | POA: Diagnosis not present

## 2023-03-07 DIAGNOSIS — E1142 Type 2 diabetes mellitus with diabetic polyneuropathy: Secondary | ICD-10-CM | POA: Diagnosis not present

## 2023-03-07 DIAGNOSIS — N390 Urinary tract infection, site not specified: Secondary | ICD-10-CM | POA: Diagnosis not present

## 2023-03-07 DIAGNOSIS — L89152 Pressure ulcer of sacral region, stage 2: Secondary | ICD-10-CM | POA: Diagnosis not present

## 2023-03-07 DIAGNOSIS — E1169 Type 2 diabetes mellitus with other specified complication: Secondary | ICD-10-CM | POA: Diagnosis not present

## 2023-03-07 DIAGNOSIS — J45909 Unspecified asthma, uncomplicated: Secondary | ICD-10-CM | POA: Diagnosis not present

## 2023-03-07 DIAGNOSIS — E782 Mixed hyperlipidemia: Secondary | ICD-10-CM | POA: Diagnosis not present

## 2023-03-07 DIAGNOSIS — L89626 Pressure-induced deep tissue damage of left heel: Secondary | ICD-10-CM | POA: Diagnosis not present

## 2023-03-07 DIAGNOSIS — I10 Essential (primary) hypertension: Secondary | ICD-10-CM | POA: Diagnosis not present

## 2023-03-07 DIAGNOSIS — I2510 Atherosclerotic heart disease of native coronary artery without angina pectoris: Secondary | ICD-10-CM | POA: Diagnosis not present

## 2023-03-07 DIAGNOSIS — Z7901 Long term (current) use of anticoagulants: Secondary | ICD-10-CM | POA: Diagnosis not present

## 2023-03-07 DIAGNOSIS — E43 Unspecified severe protein-calorie malnutrition: Secondary | ICD-10-CM | POA: Diagnosis not present

## 2023-03-07 DIAGNOSIS — L853 Xerosis cutis: Secondary | ICD-10-CM | POA: Diagnosis not present

## 2023-03-07 DIAGNOSIS — K760 Fatty (change of) liver, not elsewhere classified: Secondary | ICD-10-CM | POA: Diagnosis not present

## 2023-03-07 DIAGNOSIS — A419 Sepsis, unspecified organism: Secondary | ICD-10-CM | POA: Diagnosis not present

## 2023-03-07 DIAGNOSIS — R413 Other amnesia: Secondary | ICD-10-CM | POA: Diagnosis not present

## 2023-03-07 DIAGNOSIS — M159 Polyosteoarthritis, unspecified: Secondary | ICD-10-CM | POA: Diagnosis not present

## 2023-03-07 DIAGNOSIS — E039 Hypothyroidism, unspecified: Secondary | ICD-10-CM | POA: Diagnosis not present

## 2023-03-08 DIAGNOSIS — E8721 Acute metabolic acidosis: Secondary | ICD-10-CM | POA: Diagnosis not present

## 2023-03-08 DIAGNOSIS — R627 Adult failure to thrive: Secondary | ICD-10-CM | POA: Diagnosis not present

## 2023-03-08 DIAGNOSIS — E119 Type 2 diabetes mellitus without complications: Secondary | ICD-10-CM | POA: Diagnosis not present

## 2023-03-08 DIAGNOSIS — Z743 Need for continuous supervision: Secondary | ICD-10-CM | POA: Diagnosis not present

## 2023-03-08 DIAGNOSIS — E872 Acidosis, unspecified: Secondary | ICD-10-CM | POA: Diagnosis not present

## 2023-03-08 DIAGNOSIS — R7401 Elevation of levels of liver transaminase levels: Secondary | ICD-10-CM | POA: Diagnosis not present

## 2023-03-08 DIAGNOSIS — Z880 Allergy status to penicillin: Secondary | ICD-10-CM | POA: Diagnosis not present

## 2023-03-08 DIAGNOSIS — E871 Hypo-osmolality and hyponatremia: Secondary | ICD-10-CM | POA: Diagnosis not present

## 2023-03-08 DIAGNOSIS — G9341 Metabolic encephalopathy: Secondary | ICD-10-CM | POA: Diagnosis not present

## 2023-03-08 DIAGNOSIS — J45909 Unspecified asthma, uncomplicated: Secondary | ICD-10-CM | POA: Diagnosis not present

## 2023-03-08 DIAGNOSIS — K219 Gastro-esophageal reflux disease without esophagitis: Secondary | ICD-10-CM | POA: Diagnosis not present

## 2023-03-08 DIAGNOSIS — M6281 Muscle weakness (generalized): Secondary | ICD-10-CM | POA: Diagnosis not present

## 2023-03-08 DIAGNOSIS — M1990 Unspecified osteoarthritis, unspecified site: Secondary | ICD-10-CM | POA: Diagnosis not present

## 2023-03-08 DIAGNOSIS — I959 Hypotension, unspecified: Secondary | ICD-10-CM | POA: Diagnosis not present

## 2023-03-08 DIAGNOSIS — Z87440 Personal history of urinary (tract) infections: Secondary | ICD-10-CM | POA: Diagnosis not present

## 2023-03-08 DIAGNOSIS — Z681 Body mass index (BMI) 19 or less, adult: Secondary | ICD-10-CM | POA: Diagnosis not present

## 2023-03-08 DIAGNOSIS — E7800 Pure hypercholesterolemia, unspecified: Secondary | ICD-10-CM | POA: Diagnosis not present

## 2023-03-08 DIAGNOSIS — I10 Essential (primary) hypertension: Secondary | ICD-10-CM | POA: Diagnosis not present

## 2023-03-08 DIAGNOSIS — E43 Unspecified severe protein-calorie malnutrition: Secondary | ICD-10-CM | POA: Diagnosis not present

## 2023-03-08 DIAGNOSIS — L89150 Pressure ulcer of sacral region, unstageable: Secondary | ICD-10-CM | POA: Diagnosis not present

## 2023-03-08 DIAGNOSIS — I4891 Unspecified atrial fibrillation: Secondary | ICD-10-CM | POA: Diagnosis not present

## 2023-03-08 DIAGNOSIS — Z885 Allergy status to narcotic agent status: Secondary | ICD-10-CM | POA: Diagnosis not present

## 2023-03-08 DIAGNOSIS — Z7401 Bed confinement status: Secondary | ICD-10-CM | POA: Diagnosis not present

## 2023-03-08 DIAGNOSIS — R531 Weakness: Secondary | ICD-10-CM | POA: Diagnosis not present

## 2023-03-08 DIAGNOSIS — E039 Hypothyroidism, unspecified: Secondary | ICD-10-CM | POA: Diagnosis not present

## 2023-03-08 DIAGNOSIS — D649 Anemia, unspecified: Secondary | ICD-10-CM | POA: Diagnosis not present

## 2023-03-08 DIAGNOSIS — E860 Dehydration: Secondary | ICD-10-CM | POA: Diagnosis not present

## 2023-03-08 DIAGNOSIS — I252 Old myocardial infarction: Secondary | ICD-10-CM | POA: Diagnosis not present

## 2023-03-08 DIAGNOSIS — Z8673 Personal history of transient ischemic attack (TIA), and cerebral infarction without residual deficits: Secondary | ICD-10-CM | POA: Diagnosis not present

## 2023-03-18 DIAGNOSIS — R569 Unspecified convulsions: Secondary | ICD-10-CM | POA: Diagnosis not present

## 2023-03-18 DIAGNOSIS — I219 Acute myocardial infarction, unspecified: Secondary | ICD-10-CM | POA: Diagnosis not present

## 2023-03-18 DIAGNOSIS — I4891 Unspecified atrial fibrillation: Secondary | ICD-10-CM | POA: Diagnosis not present

## 2023-03-18 DIAGNOSIS — R627 Adult failure to thrive: Secondary | ICD-10-CM | POA: Diagnosis not present

## 2023-03-18 DIAGNOSIS — Z743 Need for continuous supervision: Secondary | ICD-10-CM | POA: Diagnosis not present

## 2023-03-18 DIAGNOSIS — Z681 Body mass index (BMI) 19 or less, adult: Secondary | ICD-10-CM | POA: Diagnosis not present

## 2023-03-18 DIAGNOSIS — R0902 Hypoxemia: Secondary | ICD-10-CM | POA: Diagnosis not present

## 2023-03-18 DIAGNOSIS — R0602 Shortness of breath: Secondary | ICD-10-CM | POA: Diagnosis not present

## 2023-03-18 DIAGNOSIS — I10 Essential (primary) hypertension: Secondary | ICD-10-CM | POA: Diagnosis not present

## 2023-03-18 DIAGNOSIS — R531 Weakness: Secondary | ICD-10-CM | POA: Diagnosis not present

## 2023-03-18 DIAGNOSIS — R6889 Other general symptoms and signs: Secondary | ICD-10-CM | POA: Diagnosis not present

## 2023-03-18 DIAGNOSIS — I252 Old myocardial infarction: Secondary | ICD-10-CM | POA: Diagnosis not present

## 2023-03-18 DIAGNOSIS — Z8673 Personal history of transient ischemic attack (TIA), and cerebral infarction without residual deficits: Secondary | ICD-10-CM | POA: Diagnosis not present

## 2023-03-18 DIAGNOSIS — Z7401 Bed confinement status: Secondary | ICD-10-CM | POA: Diagnosis not present

## 2023-03-18 DIAGNOSIS — Z66 Do not resuscitate: Secondary | ICD-10-CM | POA: Diagnosis not present

## 2023-03-21 DIAGNOSIS — M6281 Muscle weakness (generalized): Secondary | ICD-10-CM | POA: Diagnosis not present

## 2023-04-12 DEATH — deceased
